# Patient Record
Sex: Male | Born: 1975
Health system: Southern US, Community
[De-identification: ages and names within clinical notes are randomized; demographics above are authoritative.]

## PROBLEM LIST (undated history)

## (undated) DIAGNOSIS — I38 Endocarditis, valve unspecified: Secondary | ICD-10-CM

## (undated) HISTORY — PX: NO PAST SURGERIES: SHX2092

---

## 2008-01-14 ENCOUNTER — Emergency Department: Payer: Self-pay | Admitting: Emergency Medicine

## 2020-05-13 DIAGNOSIS — R7881 Bacteremia: Secondary | ICD-10-CM

## 2020-05-14 DIAGNOSIS — I4891 Unspecified atrial fibrillation: Secondary | ICD-10-CM

## 2020-05-14 DIAGNOSIS — I079 Rheumatic tricuspid valve disease, unspecified: Secondary | ICD-10-CM

## 2020-05-14 DIAGNOSIS — J189 Pneumonia, unspecified organism: Secondary | ICD-10-CM

## 2020-05-14 DIAGNOSIS — F1910 Other psychoactive substance abuse, uncomplicated: Secondary | ICD-10-CM

## 2020-05-16 DIAGNOSIS — I079 Rheumatic tricuspid valve disease, unspecified: Secondary | ICD-10-CM

## 2020-05-16 DIAGNOSIS — J189 Pneumonia, unspecified organism: Secondary | ICD-10-CM

## 2020-05-16 DIAGNOSIS — I2690 Septic pulmonary embolism without acute cor pulmonale: Secondary | ICD-10-CM

## 2020-05-16 DIAGNOSIS — I4891 Unspecified atrial fibrillation: Secondary | ICD-10-CM

## 2020-05-18 DIAGNOSIS — M545 Low back pain, unspecified: Secondary | ICD-10-CM

## 2020-05-18 DIAGNOSIS — N289 Disorder of kidney and ureter, unspecified: Secondary | ICD-10-CM

## 2020-05-21 ENCOUNTER — Emergency Department (HOSPITAL_COMMUNITY): Payer: Medicaid Other

## 2020-05-21 ENCOUNTER — Other Ambulatory Visit: Payer: Self-pay

## 2020-05-21 ENCOUNTER — Inpatient Hospital Stay (HOSPITAL_COMMUNITY)
Admission: EM | Admit: 2020-05-21 | Discharge: 2020-07-15 | DRG: 981 | Disposition: A | Discharge status: Home / Self Care | Payer: Medicaid Other | Attending: Internal Medicine | Admitting: Internal Medicine

## 2020-05-21 DIAGNOSIS — N189 Chronic kidney disease, unspecified: Secondary | ICD-10-CM | POA: Diagnosis present

## 2020-05-21 DIAGNOSIS — B181 Chronic viral hepatitis B without delta-agent: Secondary | ICD-10-CM | POA: Diagnosis present

## 2020-05-21 DIAGNOSIS — M4646 Discitis, unspecified, lumbar region: Secondary | ICD-10-CM | POA: Diagnosis present

## 2020-05-21 DIAGNOSIS — R748 Abnormal levels of other serum enzymes: Secondary | ICD-10-CM | POA: Diagnosis present

## 2020-05-21 DIAGNOSIS — F17210 Nicotine dependence, cigarettes, uncomplicated: Secondary | ICD-10-CM | POA: Diagnosis present

## 2020-05-21 DIAGNOSIS — I2690 Septic pulmonary embolism without acute cor pulmonale: Secondary | ICD-10-CM | POA: Diagnosis present

## 2020-05-21 DIAGNOSIS — I38 Endocarditis, valve unspecified: Secondary | ICD-10-CM

## 2020-05-21 DIAGNOSIS — G061 Intraspinal abscess and granuloma: Secondary | ICD-10-CM | POA: Diagnosis not present

## 2020-05-21 DIAGNOSIS — B9562 Methicillin resistant Staphylococcus aureus infection as the cause of diseases classified elsewhere: Secondary | ICD-10-CM | POA: Diagnosis present

## 2020-05-21 DIAGNOSIS — I472 Ventricular tachycardia: Secondary | ICD-10-CM | POA: Diagnosis not present

## 2020-05-21 DIAGNOSIS — N131 Hydronephrosis with ureteral stricture, not elsewhere classified: Secondary | ICD-10-CM | POA: Diagnosis not present

## 2020-05-21 DIAGNOSIS — N1330 Unspecified hydronephrosis: Secondary | ICD-10-CM

## 2020-05-21 DIAGNOSIS — R008 Other abnormalities of heart beat: Secondary | ICD-10-CM | POA: Diagnosis not present

## 2020-05-21 DIAGNOSIS — E785 Hyperlipidemia, unspecified: Secondary | ICD-10-CM

## 2020-05-21 DIAGNOSIS — Z6825 Body mass index (BMI) 25.0-25.9, adult: Secondary | ICD-10-CM

## 2020-05-21 DIAGNOSIS — D6959 Other secondary thrombocytopenia: Secondary | ICD-10-CM | POA: Diagnosis present

## 2020-05-21 DIAGNOSIS — E8809 Other disorders of plasma-protein metabolism, not elsewhere classified: Secondary | ICD-10-CM | POA: Diagnosis present

## 2020-05-21 DIAGNOSIS — G253 Myoclonus: Secondary | ICD-10-CM | POA: Diagnosis not present

## 2020-05-21 DIAGNOSIS — E872 Acidosis: Secondary | ICD-10-CM | POA: Diagnosis not present

## 2020-05-21 DIAGNOSIS — F1190 Opioid use, unspecified, uncomplicated: Secondary | ICD-10-CM

## 2020-05-21 DIAGNOSIS — I76 Septic arterial embolism: Secondary | ICD-10-CM | POA: Diagnosis present

## 2020-05-21 DIAGNOSIS — N049 Nephrotic syndrome with unspecified morphologic changes: Secondary | ICD-10-CM | POA: Diagnosis present

## 2020-05-21 DIAGNOSIS — R042 Hemoptysis: Secondary | ICD-10-CM | POA: Diagnosis present

## 2020-05-21 DIAGNOSIS — N170 Acute kidney failure with tubular necrosis: Secondary | ICD-10-CM | POA: Diagnosis not present

## 2020-05-21 DIAGNOSIS — N471 Phimosis: Secondary | ICD-10-CM | POA: Diagnosis present

## 2020-05-21 DIAGNOSIS — M4626 Osteomyelitis of vertebra, lumbar region: Secondary | ICD-10-CM | POA: Diagnosis present

## 2020-05-21 DIAGNOSIS — Z792 Long term (current) use of antibiotics: Secondary | ICD-10-CM

## 2020-05-21 DIAGNOSIS — B1920 Unspecified viral hepatitis C without hepatic coma: Secondary | ICD-10-CM | POA: Diagnosis present

## 2020-05-21 DIAGNOSIS — M47816 Spondylosis without myelopathy or radiculopathy, lumbar region: Secondary | ICD-10-CM | POA: Diagnosis present

## 2020-05-21 DIAGNOSIS — Z20822 Contact with and (suspected) exposure to covid-19: Secondary | ICD-10-CM | POA: Diagnosis not present

## 2020-05-21 DIAGNOSIS — N028 Recurrent and persistent hematuria with other morphologic changes: Secondary | ICD-10-CM | POA: Diagnosis present

## 2020-05-21 DIAGNOSIS — J189 Pneumonia, unspecified organism: Secondary | ICD-10-CM | POA: Diagnosis not present

## 2020-05-21 DIAGNOSIS — G629 Polyneuropathy, unspecified: Secondary | ICD-10-CM | POA: Diagnosis present

## 2020-05-21 DIAGNOSIS — R7881 Bacteremia: Secondary | ICD-10-CM | POA: Diagnosis present

## 2020-05-21 DIAGNOSIS — E875 Hyperkalemia: Secondary | ICD-10-CM | POA: Diagnosis present

## 2020-05-21 DIAGNOSIS — I129 Hypertensive chronic kidney disease with stage 1 through stage 4 chronic kidney disease, or unspecified chronic kidney disease: Secondary | ICD-10-CM | POA: Diagnosis present

## 2020-05-21 DIAGNOSIS — D638 Anemia in other chronic diseases classified elsewhere: Secondary | ICD-10-CM | POA: Diagnosis present

## 2020-05-21 DIAGNOSIS — R509 Fever, unspecified: Secondary | ICD-10-CM

## 2020-05-21 DIAGNOSIS — R600 Localized edema: Secondary | ICD-10-CM | POA: Diagnosis present

## 2020-05-21 DIAGNOSIS — G062 Extradural and subdural abscess, unspecified: Secondary | ICD-10-CM | POA: Diagnosis present

## 2020-05-21 DIAGNOSIS — D619 Aplastic anemia, unspecified: Secondary | ICD-10-CM | POA: Diagnosis present

## 2020-05-21 DIAGNOSIS — Z419 Encounter for procedure for purposes other than remedying health state, unspecified: Secondary | ICD-10-CM

## 2020-05-21 DIAGNOSIS — E440 Moderate protein-calorie malnutrition: Secondary | ICD-10-CM | POA: Diagnosis present

## 2020-05-21 DIAGNOSIS — R339 Retention of urine, unspecified: Secondary | ICD-10-CM | POA: Diagnosis present

## 2020-05-21 DIAGNOSIS — E8770 Fluid overload, unspecified: Secondary | ICD-10-CM | POA: Diagnosis present

## 2020-05-21 DIAGNOSIS — F1110 Opioid abuse, uncomplicated: Secondary | ICD-10-CM | POA: Diagnosis present

## 2020-05-21 DIAGNOSIS — Q211 Atrial septal defect: Secondary | ICD-10-CM | POA: Diagnosis not present

## 2020-05-21 DIAGNOSIS — I330 Acute and subacute infective endocarditis: Secondary | ICD-10-CM | POA: Diagnosis not present

## 2020-05-21 DIAGNOSIS — E871 Hypo-osmolality and hyponatremia: Secondary | ICD-10-CM | POA: Diagnosis present

## 2020-05-21 DIAGNOSIS — R7989 Other specified abnormal findings of blood chemistry: Secondary | ICD-10-CM | POA: Diagnosis present

## 2020-05-21 DIAGNOSIS — D509 Iron deficiency anemia, unspecified: Secondary | ICD-10-CM | POA: Diagnosis present

## 2020-05-21 DIAGNOSIS — E876 Hypokalemia: Secondary | ICD-10-CM | POA: Diagnosis not present

## 2020-05-21 DIAGNOSIS — N179 Acute kidney failure, unspecified: Secondary | ICD-10-CM

## 2020-05-21 DIAGNOSIS — R319 Hematuria, unspecified: Secondary | ICD-10-CM

## 2020-05-21 DIAGNOSIS — M62838 Other muscle spasm: Secondary | ICD-10-CM | POA: Diagnosis present

## 2020-05-21 DIAGNOSIS — A4902 Methicillin resistant Staphylococcus aureus infection, unspecified site: Secondary | ICD-10-CM | POA: Diagnosis present

## 2020-05-21 HISTORY — DX: Endocarditis, valve unspecified: I38

## 2020-05-21 LAB — CBC WITH DIFFERENTIAL/PLATELET
Abs Immature Granulocytes: 0.2 10*3/uL — ABNORMAL HIGH (ref 0.00–0.07)
Basophils Absolute: 0 10*3/uL (ref 0.0–0.1)
Basophils Relative: 0 %
Eosinophils Absolute: 0 10*3/uL (ref 0.0–0.5)
Eosinophils Relative: 0 %
HCT: 21.8 % — ABNORMAL LOW (ref 39.0–52.0)
Hemoglobin: 7.3 g/dL — ABNORMAL LOW (ref 13.0–17.0)
Immature Granulocytes: 1 %
Lymphocytes Relative: 6 %
Lymphs Abs: 0.9 10*3/uL (ref 0.7–4.0)
MCH: 26.3 pg (ref 26.0–34.0)
MCHC: 33.5 g/dL (ref 30.0–36.0)
MCV: 78.4 fL — ABNORMAL LOW (ref 80.0–100.0)
Monocytes Absolute: 0.8 10*3/uL (ref 0.1–1.0)
Monocytes Relative: 5 %
Neutro Abs: 14 10*3/uL — ABNORMAL HIGH (ref 1.7–7.7)
Neutrophils Relative %: 88 %
Platelets: 154 10*3/uL (ref 150–400)
RBC: 2.78 MIL/uL — ABNORMAL LOW (ref 4.22–5.81)
RDW: 15.5 % (ref 11.5–15.5)
WBC: 15.9 10*3/uL — ABNORMAL HIGH (ref 4.0–10.5)
nRBC: 0 % (ref 0.0–0.2)

## 2020-05-21 LAB — URINALYSIS, ROUTINE W REFLEX MICROSCOPIC
Bilirubin Urine: NEGATIVE
Glucose, UA: NEGATIVE mg/dL
Ketones, ur: NEGATIVE mg/dL
Nitrite: NEGATIVE
Protein, ur: 100 mg/dL — AB
RBC / HPF: >50 RBC/hpf — ABNORMAL HIGH (ref 0–5)
Specific Gravity, Urine: 1.018 (ref 1.005–1.030)
WBC, UA: >50 WBC/hpf — ABNORMAL HIGH (ref 0–5)
pH: 5 (ref 5.0–8.0)

## 2020-05-21 LAB — CBC
HCT: 23.7 % — ABNORMAL LOW (ref 39.0–52.0)
Hemoglobin: 8.1 g/dL — ABNORMAL LOW (ref 13.0–17.0)
MCH: 27 pg (ref 26.0–34.0)
MCHC: 34.2 g/dL (ref 30.0–36.0)
MCV: 79 fL — ABNORMAL LOW (ref 80.0–100.0)
Platelets: 170 10*3/uL (ref 150–400)
RBC: 3 MIL/uL — ABNORMAL LOW (ref 4.22–5.81)
RDW: 15.7 % — ABNORMAL HIGH (ref 11.5–15.5)
WBC: 18.9 10*3/uL — ABNORMAL HIGH (ref 4.0–10.5)
nRBC: 0 % (ref 0.0–0.2)

## 2020-05-21 LAB — IRON AND TIBC
Iron: 40 ug/dL — ABNORMAL LOW (ref 45–182)
Saturation Ratios: 21 % (ref 17.9–39.5)
TIBC: 189 ug/dL — ABNORMAL LOW (ref 250–450)
UIBC: 149 ug/dL

## 2020-05-21 LAB — RENAL FUNCTION PANEL
Albumin: 1.5 g/dL — ABNORMAL LOW (ref 3.5–5.0)
Anion gap: 17 — ABNORMAL HIGH (ref 5–15)
BUN: 184 mg/dL — ABNORMAL HIGH (ref 6–20)
CO2: 12 mmol/L — ABNORMAL LOW (ref 22–32)
Calcium: 6.7 mg/dL — ABNORMAL LOW (ref 8.9–10.3)
Chloride: 98 mmol/L (ref 98–111)
Creatinine, Ser: 6.56 mg/dL — ABNORMAL HIGH (ref 0.61–1.24)
GFR, Estimated: 10 mL/min — ABNORMAL LOW (ref >60)
Glucose, Bld: 147 mg/dL — ABNORMAL HIGH (ref 70–99)
Phosphorus: >30 mg/dL — ABNORMAL HIGH (ref 2.5–4.6)
Potassium: 5.8 mmol/L — ABNORMAL HIGH (ref 3.5–5.1)
Sodium: 127 mmol/L — ABNORMAL LOW (ref 135–145)

## 2020-05-21 LAB — I-STAT CHEM 8, ED
BUN: >130 mg/dL — ABNORMAL HIGH (ref 6–20)
BUN: >130 mg/dL — ABNORMAL HIGH (ref 6–20)
Calcium, Ion: 0.74 mmol/L — CL (ref 1.15–1.40)
Calcium, Ion: 0.89 mmol/L — CL (ref 1.15–1.40)
Chloride: 102 mmol/L (ref 98–111)
Chloride: 100 mmol/L (ref 98–111)
Creatinine, Ser: 7.5 mg/dL — ABNORMAL HIGH (ref 0.61–1.24)
Creatinine, Ser: 7.4 mg/dL — ABNORMAL HIGH (ref 0.61–1.24)
Glucose, Bld: 98 mg/dL (ref 70–99)
Glucose, Bld: 95 mg/dL (ref 70–99)
HCT: 21 % — ABNORMAL LOW (ref 39.0–52.0)
HCT: 24 % — ABNORMAL LOW (ref 39.0–52.0)
Hemoglobin: 7.1 g/dL — ABNORMAL LOW (ref 13.0–17.0)
Hemoglobin: 8.2 g/dL — ABNORMAL LOW (ref 13.0–17.0)
Potassium: 6.1 mmol/L — ABNORMAL HIGH (ref 3.5–5.1)
Potassium: 6.1 mmol/L — ABNORMAL HIGH (ref 3.5–5.1)
Sodium: 128 mmol/L — ABNORMAL LOW (ref 135–145)
Sodium: 129 mmol/L — ABNORMAL LOW (ref 135–145)
TCO2: 14 mmol/L — ABNORMAL LOW (ref 22–32)
TCO2: 16 mmol/L — ABNORMAL LOW (ref 22–32)

## 2020-05-21 LAB — PROTEIN / CREATININE RATIO, URINE
Creatinine, Urine: 256.24 mg/dL
Protein Creatinine Ratio: 0.94 mg/mg{Cre} — ABNORMAL HIGH (ref 0.00–0.15)
Total Protein, Urine: 242 mg/dL

## 2020-05-21 LAB — HEMOGLOBIN A1C
Hgb A1c MFr Bld: 5.9 % — ABNORMAL HIGH (ref 4.8–5.6)
Mean Plasma Glucose: 122.63 mg/dL

## 2020-05-21 LAB — COMPREHENSIVE METABOLIC PANEL
ALT: 38 U/L (ref 0–44)
AST: 30 U/L (ref 15–41)
Albumin: 1.7 g/dL — ABNORMAL LOW (ref 3.5–5.0)
Alkaline Phosphatase: 297 U/L — ABNORMAL HIGH (ref 38–126)
Anion gap: 20 — ABNORMAL HIGH (ref 5–15)
BUN: 184 mg/dL — ABNORMAL HIGH (ref 6–20)
CO2: 11 mmol/L — ABNORMAL LOW (ref 22–32)
Calcium: 6.9 mg/dL — ABNORMAL LOW (ref 8.9–10.3)
Chloride: 99 mmol/L (ref 98–111)
Creatinine, Ser: 7.23 mg/dL — ABNORMAL HIGH (ref 0.61–1.24)
GFR, Estimated: 9 mL/min — ABNORMAL LOW (ref >60)
Glucose, Bld: 98 mg/dL (ref 70–99)
Potassium: 6.3 mmol/L (ref 3.5–5.1)
Sodium: 130 mmol/L — ABNORMAL LOW (ref 135–145)
Total Bilirubin: 1.1 mg/dL (ref 0.3–1.2)
Total Protein: 6.1 g/dL — ABNORMAL LOW (ref 6.5–8.1)

## 2020-05-21 LAB — HIV ANTIBODY (ROUTINE TESTING W REFLEX): HIV Screen 4th Generation wRfx: NONREACTIVE

## 2020-05-21 LAB — C-REACTIVE PROTEIN: CRP: 23.8 mg/dL — ABNORMAL HIGH (ref <1.0)

## 2020-05-21 LAB — BRAIN NATRIURETIC PEPTIDE: B Natriuretic Peptide: 171.3 pg/mL — ABNORMAL HIGH (ref 0.0–100.0)

## 2020-05-21 LAB — HEPARIN LEVEL (UNFRACTIONATED): Heparin Unfractionated: <0.1 IU/mL — ABNORMAL LOW (ref 0.30–0.70)

## 2020-05-21 LAB — SARS CORONAVIRUS 2 BY RT PCR (HOSPITAL ORDER, PERFORMED IN ~~LOC~~ HOSPITAL LAB): SARS Coronavirus 2: NEGATIVE

## 2020-05-21 LAB — TROPONIN I (HIGH SENSITIVITY)
Troponin I (High Sensitivity): 25 ng/L — ABNORMAL HIGH (ref <18)
Troponin I (High Sensitivity): 29 ng/L — ABNORMAL HIGH (ref <18)

## 2020-05-21 LAB — RAPID URINE DRUG SCREEN, HOSP PERFORMED
Amphetamines: NOT DETECTED
Barbiturates: NOT DETECTED
Benzodiazepines: NOT DETECTED
Cocaine: NOT DETECTED
Opiates: POSITIVE — AB
Tetrahydrocannabinol: NOT DETECTED

## 2020-05-21 LAB — LACTIC ACID, PLASMA: Lactic Acid, Venous: 0.9 mmol/L (ref 0.5–1.9)

## 2020-05-21 LAB — RETICULOCYTES
Immature Retic Fract: 7.3 % (ref 2.3–15.9)
RBC.: 2.73 MIL/uL — ABNORMAL LOW (ref 4.22–5.81)
Retic Count, Absolute: 34.4 10*3/uL (ref 19.0–186.0)
Retic Ct Pct: 1.3 % (ref 0.4–3.1)

## 2020-05-21 LAB — SEDIMENTATION RATE: Sed Rate: 105 mm/hr — ABNORMAL HIGH (ref 0–16)

## 2020-05-21 IMAGING — US US RENAL
1 series · 14 of 25 positions shown · non-contrast
Comparison: [DATE]

CLINICAL DATA: AK I

EXAM:
RENAL / URINARY TRACT ULTRASOUND COMPLETE

[Series 1: us renal · 14 of 50 slices shown]
[im 1/50]
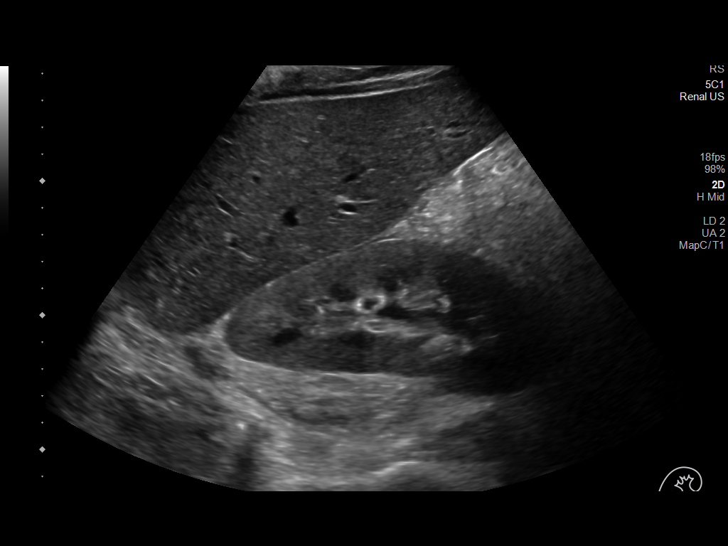
[im 5/50]
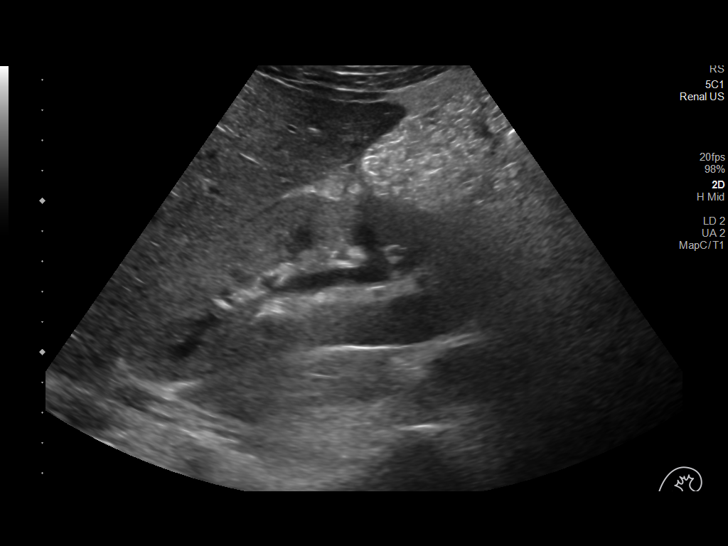
[im 9/50]
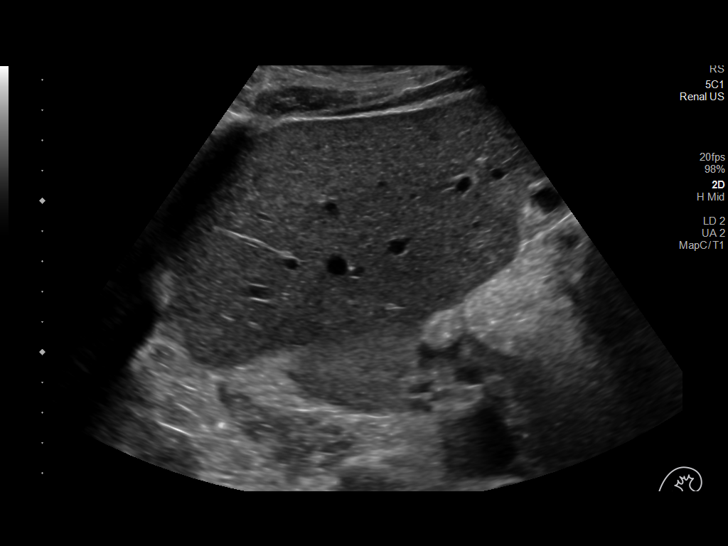
[im 13/50]
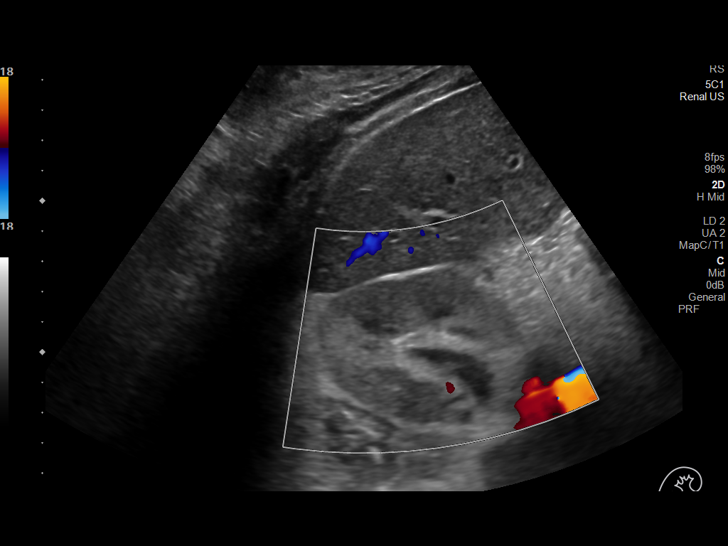
[im 17/50]
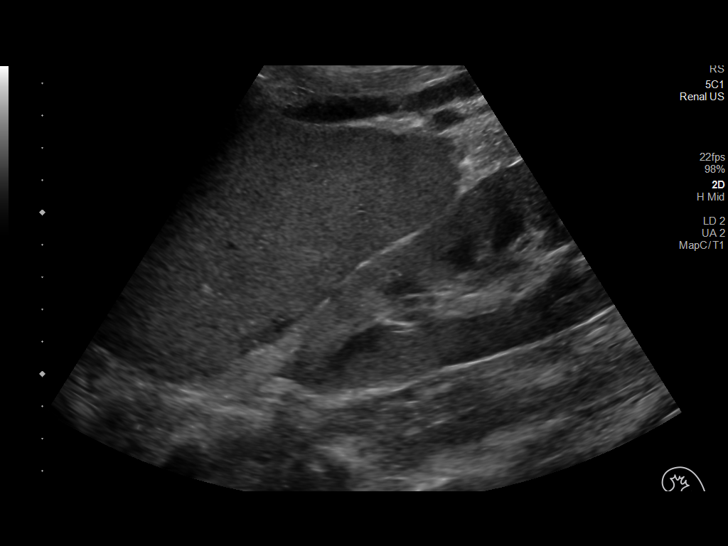
[im 19/50]
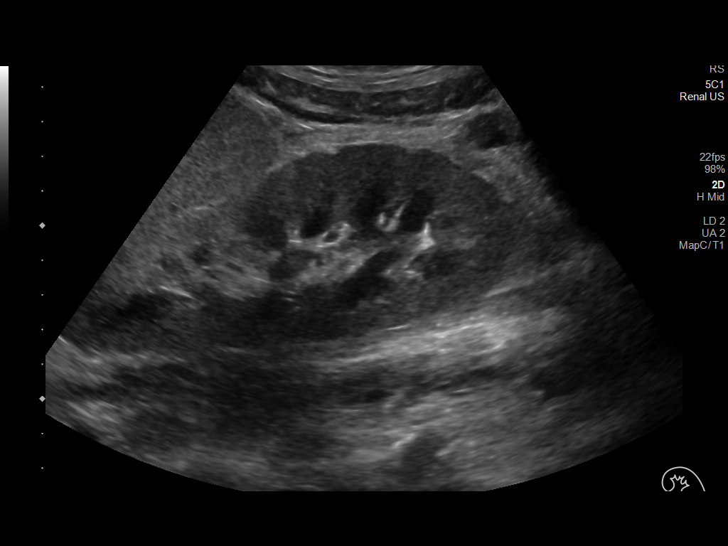
[im 23/50]
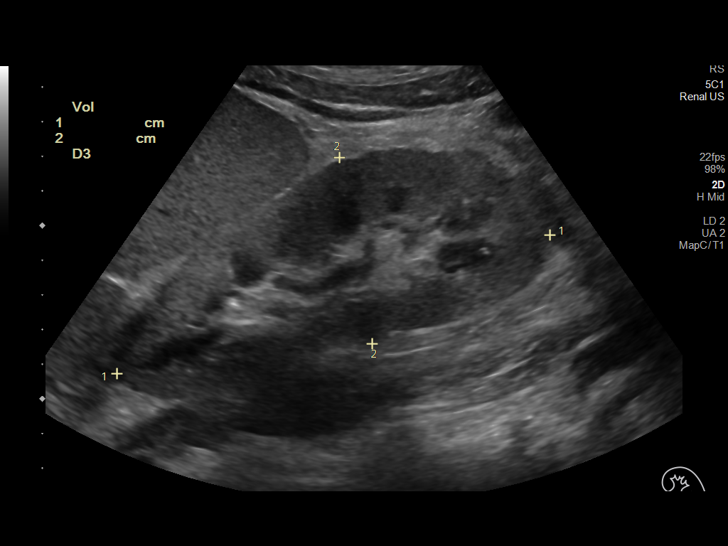
[im 27/50]
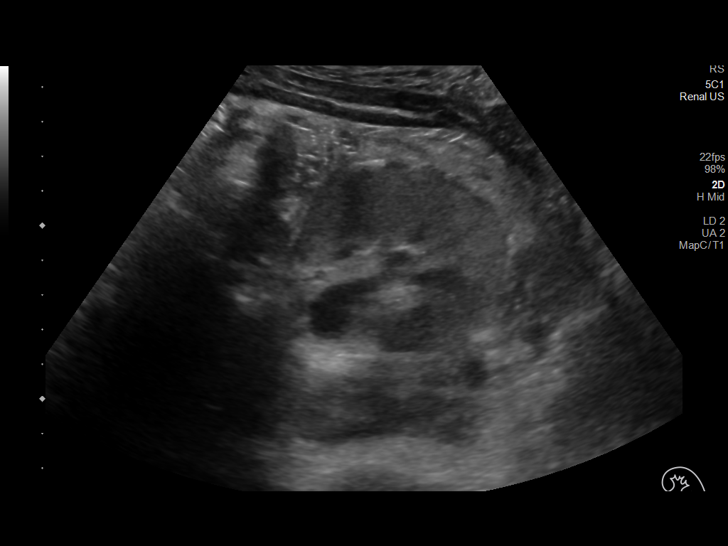
[im 31/50]
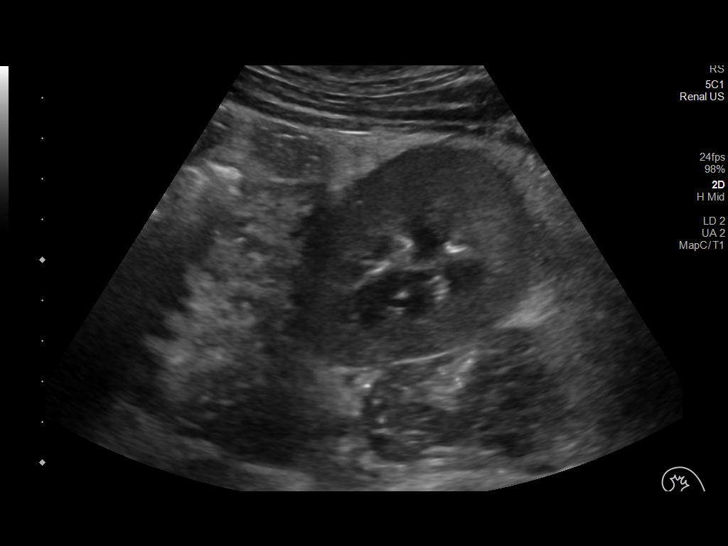
[im 33/50]
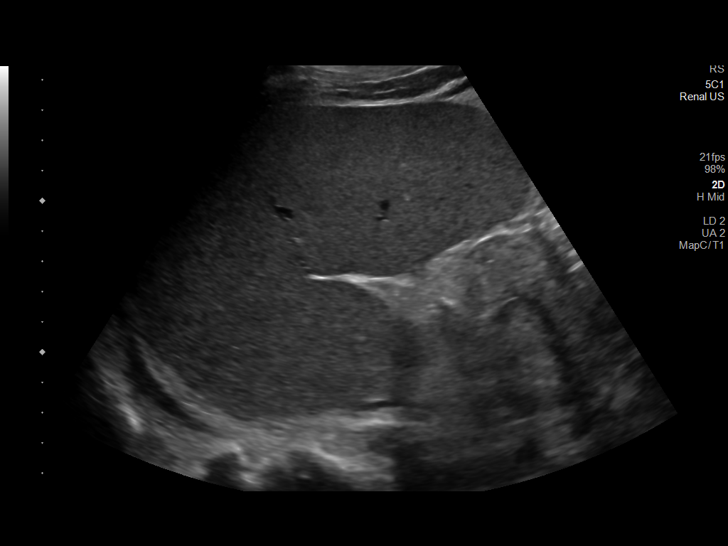
[im 37/50]
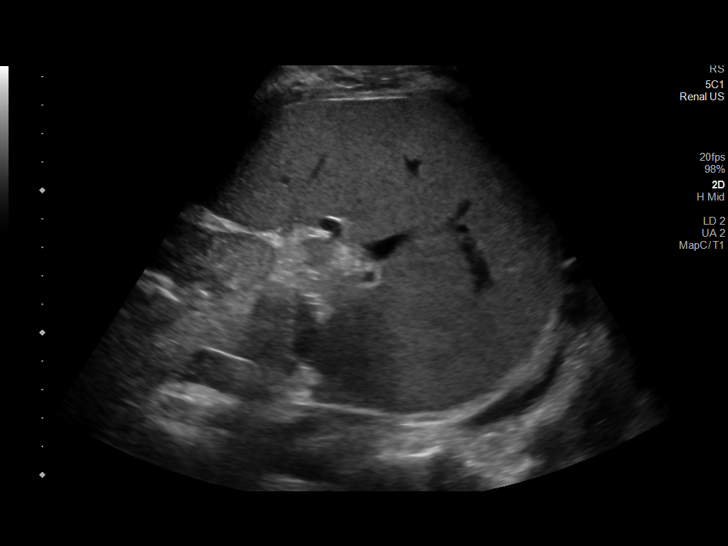
[im 41/50]
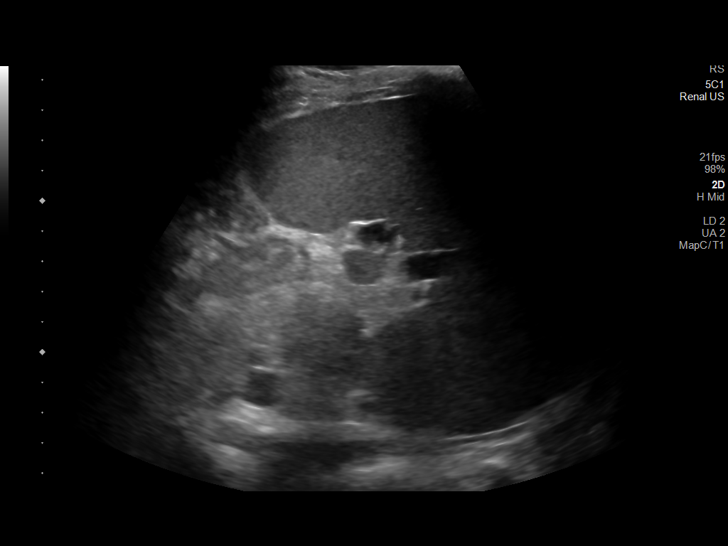
[im 45/50]
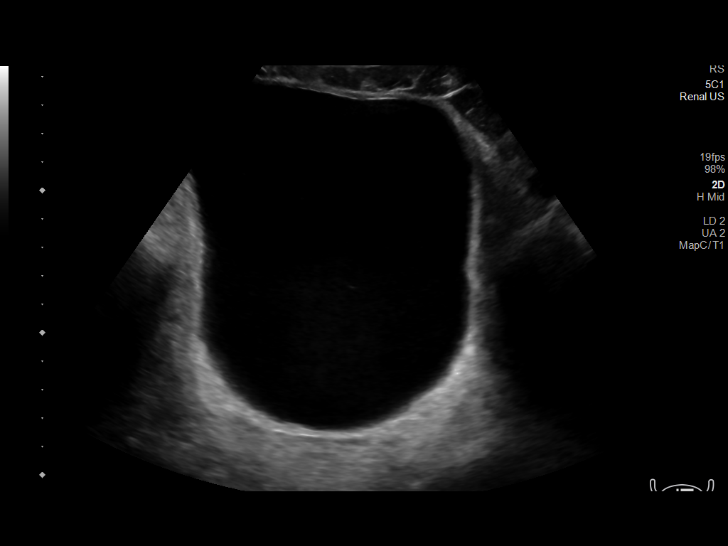
[im 50/50]
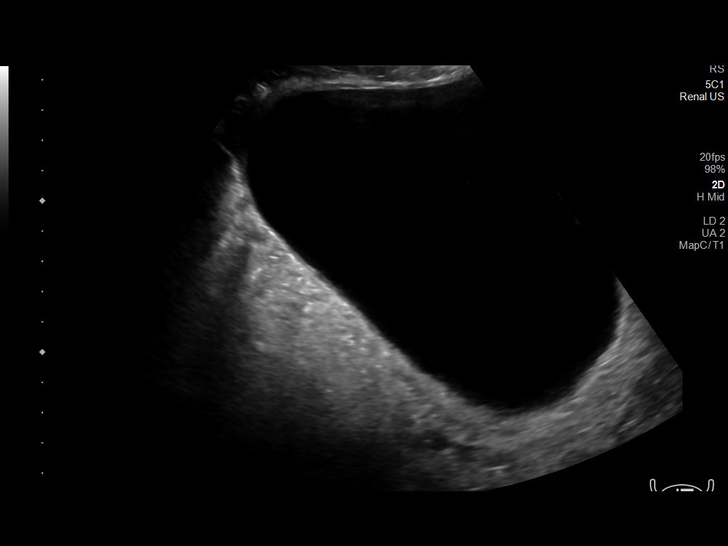

[14 of 25 positions shown; findings below may reference images not displayed]

FINDINGS: Right Kidney:

Renal measurements: 12.5 x 5.0 x 5.6 cm = volume: 182 mL. Diffusely
increased renal echogenicity. There is mild RIGHT hydronephrosis,
similar in comparison to prior.

Left Kidney:

Renal measurements: 13.1 x 5.5 x 5.9 cm = volume: 222 mL. Renal
echogenicity is diffusely increased. There is minimal LEFT
hydronephrosis.

Bladder:

Distended

Other:

Small volume ascites. Splenomegaly the spleen measuring at least 14
x 13.5 x 8 cm for a volume of 792 ML. Small LEFT pleural effusion.
IMPRESSION: 1. There is mild RIGHT greater than LEFT hydronephrosis. The bladder
is distended. Recommend correlation for outlet obstruction.
2. Splenomegaly.
3. Trace ascites and small LEFT pleural effusion.
4. Diffusely increased renal echogenicity as can be seen in medical
renal disease.

## 2020-05-21 IMAGING — DX DG CHEST 1V PORT
1 series · 1 of 1 positions shown · non-contrast
Comparison: [DATE] chest radiograph and chest CT [DATE]

CLINICAL DATA: Chest pain and shortness of breath

EXAM:
PORTABLE CHEST 1 VIEW

[chest ap]
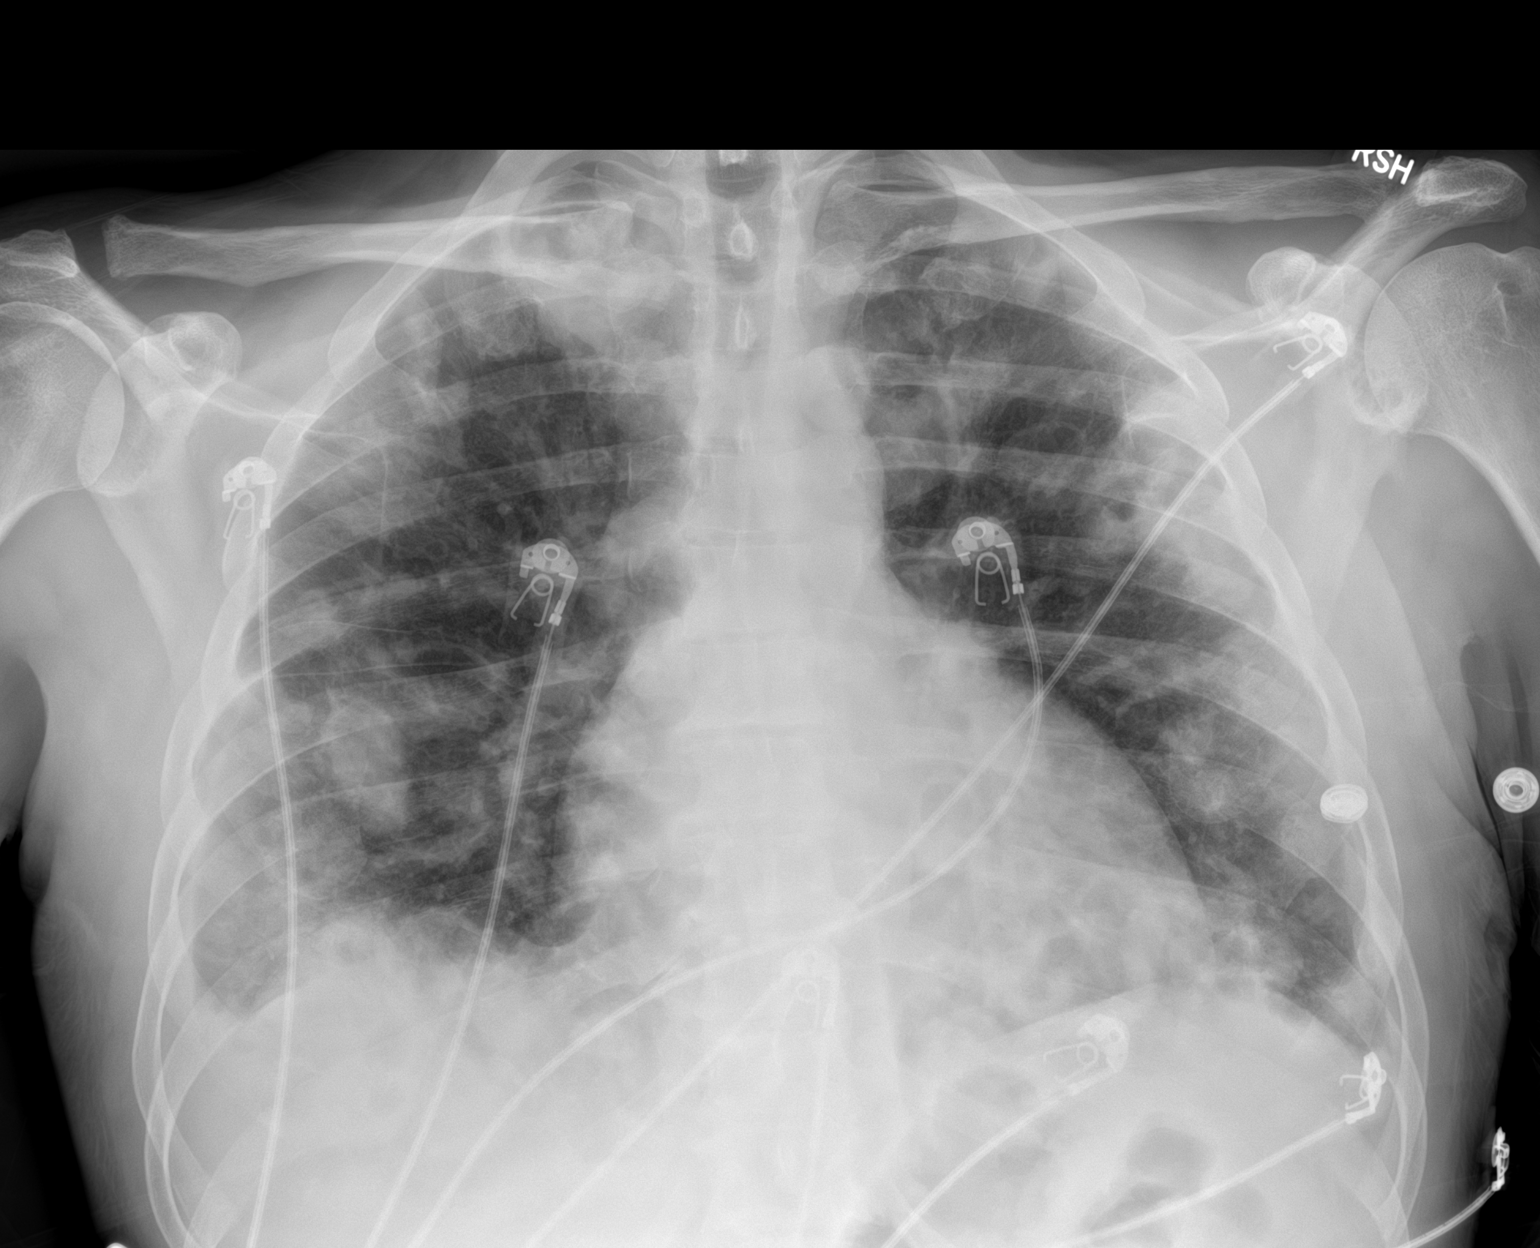

[1 of 1 positions shown; findings below may reference images not displayed]

FINDINGS: There are again noted multiple nodular lesions throughout the lungs
bilaterally, several of which are cavitated. There is a small right
pleural effusion. There is ill-defined airspace opacity in the right
base which appears increased from recent studies. Heart size and
pulmonary vascular normal. No adenopathy. Degenerative changes noted
in the left shoulder.
IMPRESSION: Multiple ill-defined nodular opacities of varying sizes, several of
which show cavitation, likely representing multifocal septic emboli.
These nodular opacities overall appear similar to findings on most
recent CT. There is a small right pleural effusion with slight
increase in airspace opacity in the right base, likely focus of
pneumonia.

Stable cardiac silhouette.

## 2020-05-21 MED ORDER — CALCIUM GLUCONATE-NACL 1-0.675 GM/50ML-% IV SOLN
1.0000 g | Freq: Once | INTRAVENOUS | Status: AC
  Administered 2020-05-21: 1000 mg via INTRAVENOUS
  Filled 2020-05-21: qty 50

## 2020-05-21 MED ORDER — SODIUM ZIRCONIUM CYCLOSILICATE 10 G PO PACK: 10.0000 g | PACK | Freq: Once | ORAL | Status: DC

## 2020-05-21 MED ORDER — CHLORHEXIDINE GLUCONATE CLOTH 2 % EX PADS
6.0000 | MEDICATED_PAD | Freq: Every day | CUTANEOUS | Status: DC
  Administered 2020-05-22 – 2020-05-23 (×2): 6 via TOPICAL

## 2020-05-21 MED ORDER — HEPARIN (PORCINE) 25000 UT/250ML-% IV SOLN
1600.0000 [IU]/h | INTRAVENOUS | Status: DC
  Administered 2020-05-21: 1300 [IU]/h via INTRAVENOUS
  Filled 2020-05-21: qty 250

## 2020-05-21 MED ORDER — SODIUM CHLORIDE 0.9 % IV SOLN
2.0000 g | Freq: Three times a day (TID) | INTRAVENOUS | Status: DC
  Administered 2020-05-21: 2 g via INTRAVENOUS
  Filled 2020-05-21: qty 2

## 2020-05-21 MED ORDER — LINEZOLID 600 MG/300ML IV SOLN
600.0000 mg | Freq: Two times a day (BID) | INTRAVENOUS | Status: DC
  Administered 2020-05-21 – 2020-05-22 (×3): 600 mg via INTRAVENOUS
  Filled 2020-05-21 (×4): qty 300

## 2020-05-21 MED ORDER — HEPARIN BOLUS VIA INFUSION
2000.0000 [IU] | Freq: Once | INTRAVENOUS | Status: DC
  Filled 2020-05-21: qty 2000

## 2020-05-21 MED ORDER — ALBUMIN HUMAN 25 % IV SOLN
25.0000 g | Freq: Once | INTRAVENOUS | Status: AC
  Administered 2020-05-21: 25 g via INTRAVENOUS
  Filled 2020-05-21: qty 100

## 2020-05-21 MED ORDER — SODIUM ZIRCONIUM CYCLOSILICATE 10 G PO PACK
10.0000 g | PACK | Freq: Once | ORAL | Status: AC
  Administered 2020-05-21: 10 g via ORAL
  Filled 2020-05-21: qty 1

## 2020-05-21 MED ORDER — SODIUM CHLORIDE 0.9 % IV SOLN: 1.0000 g | INTRAVENOUS | Status: DC

## 2020-05-21 MED ORDER — VANCOMYCIN HCL 1500 MG/300ML IV SOLN
1500.0000 mg | Freq: Once | INTRAVENOUS | Status: DC
  Filled 2020-05-21: qty 300

## 2020-05-21 MED ORDER — HEPARIN BOLUS VIA INFUSION
1500.0000 [IU] | Freq: Once | INTRAVENOUS | Status: DC
  Filled 2020-05-21: qty 1500

## 2020-05-21 NOTE — ED Provider Notes (Signed)
[facility] EMERGENCY DEPARTMENT Provider Note   CSN: [id number] Arrival date & time: 05/21/20  0827     History Chief Complaint  Patient presents with  . Chest Pain    Jadyn Barge is a 45 y.o. male with no pertinent past medical history that presents the emergency department today for chest pain and shortness of breath.  Patient states that he left AMA from [facility] 2 days ago, was there for 10 days.  States that he was being treated for an infection in his heart and pneumonia.  States that he went home after leaving AMA, was too weak to do anything therefore called EMS today.  States that his legs have been swelling, started yesterday.  States that he is unsure what his diagnosis was.  Does admit to cough, states that he is having multiple rounds of hemoptysis which started 2 days ago.  States that he was tested for Covid there, was negative twice.  Denies any sick contacts.  Denies any fevers or chills.  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  States that he has hepatitis C that he is aware of, has not been treated for this.  Does not have any other diagnoses that he is aware of.  States that his chest pain is in the center of his chest, is pleuritic and sharp.  States that he is very weak, was unable to to get out of bed this morning/walk.  No focal weakness.  Denies any headache, nausea, vomiting, abdominal pain, back pain, diarrhea.  Denies any neck pain.  HPI     No past medical history on file.  There are no problems to display for this patient.       No family history on file.     Home Medications Prior to Admission medications   Not on File    Allergies    Patient has no allergy information on record.  Review of Systems   Review of Systems  Constitutional: Negative for chills, diaphoresis, fatigue and fever.  HENT: Negative for congestion, sore throat and trouble swallowing.   Eyes: Negative for pain and  visual disturbance.  Respiratory: Positive for shortness of breath. Negative for cough and wheezing.   Cardiovascular: Positive for chest pain and leg swelling. Negative for palpitations.  Gastrointestinal: Negative for abdominal distention, abdominal pain, diarrhea, nausea and vomiting.  Genitourinary: Negative for difficulty urinating.  Musculoskeletal: Negative for back pain, neck pain and neck stiffness.  Skin: Negative for pallor.  Neurological: Negative for dizziness, speech difficulty, weakness and headaches.  Psychiatric/Behavioral: Negative for confusion.    Physical Exam Updated Vital Signs BP (!) 167/92   Pulse 95   Temp 98.3 F (36.8 C) (Oral)   Resp (!) 26   Ht 5' 10"  (1.778 m)   Wt 74.8 kg   SpO2 100%   BMI 23.68 kg/m   Physical Exam Constitutional:      General: He is in acute distress.     Appearance: Normal appearance. He is ill-appearing. He is not toxic-appearing or diaphoretic.     Comments: Patient is an ill-appearing 45 year old, looks older than stated age.  Appears cachectic and weak.  HENT:     Head: Normocephalic and atraumatic.     Mouth/Throat:     Mouth: Mucous membranes are moist.     Pharynx: Oropharynx is clear.  Eyes:     General: No scleral icterus.    Extraocular Movements: Extraocular movements  intact.     Pupils: Pupils are equal, round, and reactive to light.  Cardiovascular:     Rate and Rhythm: Normal rate and regular rhythm.     Pulses: Normal pulses.     Heart sounds: Murmur heard.    Pulmonary:     Effort: Pulmonary effort is normal. Tachypnea present. No accessory muscle usage or respiratory distress.     Breath sounds: Normal breath sounds. No stridor. No wheezing, rhonchi or rales.  Chest:     Chest wall: No tenderness.  Abdominal:     General: Abdomen is flat. There is no distension.     Palpations: Abdomen is soft.     Tenderness: There is no abdominal tenderness. There is no guarding or rebound.  Musculoskeletal:         General: No swelling or tenderness. Normal range of motion.     Cervical back: Normal range of motion and neck supple. No rigidity.     Right lower leg: Edema present.     Left lower leg: Edema present.     Comments: 4+ pitting edema noted throughout bilateral lower extremities.  Able to range extremities very minimally due to weakness and pain.  No erythema or warmth noted.  Skin:    General: Skin is warm and dry.     Capillary Refill: Capillary refill takes less than 2 seconds.     Coloration: Skin is not pale.     Comments: Track marks noted.  Neurological:     General: No focal deficit present.     Mental Status: He is alert and oriented to person, place, and time.     Cranial Nerves: No cranial nerve deficit.     Motor: Weakness present.     Coordination: Coordination normal.     Comments: Alert. Clear speech. No facial droop. CNIII-XII grossly intact. Bilateral upper and lower extremities' sensation grossly intact. 4/5 symmetric strength with grip strength and with plantar and dorsi flexion bilaterally.  Psychiatric:        Mood and Affect: Mood normal.        Behavior: Behavior normal.     ED Results / Procedures / Treatments   Labs (all labs ordered are listed, but only abnormal results are displayed) Labs Reviewed  CBC - Abnormal; Notable for the following components:      Result Value   WBC 18.9 (*)    RBC 3.00 (*)    Hemoglobin 8.1 (*)    HCT 23.7 (*)    MCV 79.0 (*)    RDW 15.7 (*)    All other components within normal limits  COMPREHENSIVE METABOLIC PANEL - Abnormal; Notable for the following components:   Sodium 130 (*)    Potassium 6.3 (*)    CO2 11 (*)    BUN 184 (*)    Creatinine, Ser 7.23 (*)    Calcium 6.9 (*)    Total Protein 6.1 (*)    Albumin 1.7 (*)    Alkaline Phosphatase 297 (*)    GFR, Estimated 9 (*)    Anion gap 20 (*)    All other components within normal limits  BRAIN NATRIURETIC PEPTIDE - Abnormal; Notable for the following  components:   B Natriuretic Peptide 171.3 (*)    All other components within normal limits  SEDIMENTATION RATE - Abnormal; Notable for the following components:   Sed Rate 105 (*)    All other components within normal limits  I-STAT CHEM 8, ED - Abnormal; Notable  for the following components:   Sodium 128 (*)    Potassium 6.1 (*)    BUN >130 (*)    Creatinine, Ser 7.50 (*)    Calcium, Ion 0.74 (*)    TCO2 14 (*)    Hemoglobin 7.1 (*)    HCT 21.0 (*)    All other components within normal limits  TROPONIN I (HIGH SENSITIVITY) - Abnormal; Notable for the following components:   Troponin I (High Sensitivity) 25 (*)    All other components within normal limits  SARS CORONAVIRUS 2 BY RT PCR (HOSPITAL ORDER, [place] [facility])  CULTURE, BLOOD (ROUTINE X 2)  CULTURE, BLOOD (ROUTINE X 2)  C-REACTIVE PROTEIN  HIV ANTIBODY (ROUTINE TESTING W REFLEX)  LACTIC ACID, PLASMA  LACTIC ACID, PLASMA  HEPARIN LEVEL (UNFRACTIONATED)  TROPONIN I (HIGH SENSITIVITY)    EKG None  Radiology DG Chest Port 1 View  Result Date: 05/21/2020 CLINICAL DATA:  Chest pain and shortness of breath EXAM: PORTABLE CHEST 1 VIEW COMPARISON:  May 15, 2020 chest radiograph and chest CT May 18, 2020 FINDINGS: There are again noted multiple nodular lesions throughout the lungs bilaterally, several of which are cavitated. There is a small right pleural effusion. There is ill-defined airspace opacity in the right base which appears increased from recent studies. Heart size and pulmonary vascular normal. No adenopathy. Degenerative changes noted in the left shoulder. IMPRESSION: Multiple ill-defined nodular opacities of varying sizes, several of which show cavitation, likely representing multifocal septic emboli. These nodular opacities overall appear similar to findings on most recent CT. There is a small right pleural effusion with slight increase in airspace opacity in the right base, likely focus  of pneumonia. Stable cardiac silhouette. Electronically Signed   By: Lowella Grip III M.D.   On: 05/21/2020 09:02    Procedures .Critical Care Performed by: Alfredia Client, PA-C Authorized by: Alfredia Client, PA-C   Critical care provider statement:    Critical care time (minutes):  45   Critical care was time spent personally by me on the following activities:  Discussions with consultants, evaluation of patient's response to treatment, examination of patient, ordering and performing treatments and interventions, ordering and review of laboratory studies, ordering and review of radiographic studies, pulse oximetry, re-evaluation of patient's condition, obtaining history from patient or surrogate and review of old charts     Medications Ordered in ED Medications  ceFEPIme (MAXIPIME) 2 g in sodium chloride 0.9 % 100 mL IVPB (2 g Intravenous New Bag/Given 05/21/20 1024)  linezolid (ZYVOX) IVPB 600 mg (has no administration in time range)  heparin ADULT infusion 100 units/mL (25000 units/247m) (has no administration in time range)  sodium zirconium cyclosilicate (LOKELMA) packet 10 g (has no administration in time range)  calcium gluconate 1 g/ 50 mL sodium chloride IVPB (has no administration in time range)    ED Course  I have reviewed the triage vital signs and the nursing notes.  Pertinent labs & imaging results that were available during my care of the patient were reviewed by me and considered in my medical decision making (see chart for details).  Clinical Course as of 05/21/20 1101  Thu May 21, 2020  1025 Sodium(!): 128 [SP]    Clinical Course User Index [SP] PAlfredia Client PA-C   MDM Rules/Calculators/A&P                         DMuhamed Lueckeis a 45y.o. male  with no pertinent past medical history that presents the emergency department today for chest pain and shortness of breath.  Was able to review [facility]'s imaging, did request for records.  Chest x-ray on January  18 that showed patchy bilateral peripheral opacities concerning for multifocal pneumonia.  CT did show similar groundglass opacities suggestive of COVID-19 infection.  Did have increased creatinine function, did get VQ scan with findings suspicious for PE.  Did get another CT on January 24 which showed increased size and cavitation of bilateral pulmonary nodules, suspicious of septic emboli.  Did also show third spacing throughout.  Unable to see other records at this time.  Per pharmacy records, patient was on vancomycin and patient was still growing out MRSA, pharmacy states they had transitioned him over to Zyvox.  Will initiate that along with cefepime for presumed endocarditis.  We will also start patient on heparin for presumed PE.  Unable to obtain PE study at this time due to creatinine.  Work-up today shows CMP with potassium of 6.3, creatinine of 7.23, other derangements including low calcium of 6.9, albumin of 1.7.  Elevated alk phos and elevated anion gap.  Most likely due to metabolic acidosis.  Will give calcium and Lokelma here today.  Did speak to Dr. Johnney Ou, nephrology.  They will consult.   CBC with leukocytosis of 18.9, hemoglobin of 8.1.  Troponin elevated to 25, BNP 171.  Think patient has lower extremity edema most likely due to third spacing from poor kidney function, questionable heart failure.  Unable to obtain echo from prior records.  Patient is stabilized for admission at this time.  Covid negative.  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  MRSA bacteremia, secondary to pneumonia and IV drug abuse.   Spoke to Dr. Rebeca Alert, IM residents who lives at the patient.  The patient appears reasonably stabilized for admission considering the current resources, flow, and capabilities available in the ED at this time, and I doubt any other [facility] requiring further screening and/or treatment in the ED prior to  admission.  I discussed this case with my attending physician who cosigned this note including patient's presenting symptoms, physical exam, and planned diagnostics and interventions. Attending physician stated agreement with plan or made changes to plan which were implemented.    Final Clinical Impression(s) / ED Diagnoses Final diagnoses:  Subacute infective endocarditis, due to unspecified organism    Rx / DC Orders ED Discharge Orders    None       Alfredia Client, PA-C 05/21/20 1242    Truddie Hidden, MD 05/21/20 [phone]

## 2020-05-21 NOTE — Consult Note (Signed)
Urology Consult   Physician requesting consult: Jessy Oto, MD  Reason for consult: Paraphimosis  History of Present Illness: Steven Cortez is a 45 y.o. male with histor of IVDU, HCV, recent endocarditis and MRSA bacteremia for whom urology is consulted regarding concern for paraphimosis following placement of foley catheter in the ED.   Patient was recently hospitalized for endocarditis but per notes left AMA. On arrival to the ED, noted to have severe AKI, metabolic acidosis. RUS showed distended bladder with mild bilateral hydronephrosis. Foley catheter placement was attempted in the ED but there was difficulty pulling back the foreskin secondary to phimosis. This was eventually achieved and catheter placed with return of 750cc urine, but after placement, team was unable to replace the foreskin over the glans secondary to this phimotic band.   Denies prior history of voiding difficulties. Reports that he was voiding regularly prior to presenting to the ED.   Current Hospital Medications:  Home Meds:  No current facility-administered medications on file prior to encounter.   No current outpatient medications on file prior to encounter.     Scheduled Meds: . [START ON 05/22/2020] Chlorhexidine Gluconate Cloth  6 each Topical Q0600   Continuous Infusions: . heparin 1,300 Units/hr (05/21/20 1325)  . linezolid (ZYVOX) IV Stopped (05/21/20 1209)   PRN Meds:.  Allergies: No Known Allergies  No family history on file.  Social History:  has no history on file for tobacco use, alcohol use, and drug use.  ROS: A complete review of systems was performed.  All systems are negative except for pertinent findings as noted.  Physical Exam:  Vital signs in last 24 hours: Temp:  [98.2 F (36.8 C)-98.3 F (36.8 C)] 98.2 F (36.8 C) (01/27 1150) Pulse Rate:  [86-96] 94 (01/27 1630) Resp:  [20-29] 24 (01/27 1630) BP: (155-176)/(79-101) 171/97 (01/27 1630) SpO2:  [98 %-100 %] 98 %  (01/27 1630) Weight:  [74.8 kg] 74.8 kg (01/27 0832) Constitutional:  Alert and oriented, No acute distress HEENT: Poor dentition Cardiovascular: Regular rate and rhythm Respiratory: Increased work of breathing on room air GI: Abdomen is soft, nontender, nondistended, no abdominal masses GU: Foley in place, urine clear yellow. Penis uncircumcised, tight phimotic band of retracted foreskin, glans normal appearing, healthy, non-edematous. Foreskin replaced over glans without difficulty.  Lymphatic: bilateral LE pitting edema Neurologic: Grossly intact, no focal deficits Psychiatric: Normal mood and affect  Laboratory Data:  Recent Labs    05/21/20 0831 05/21/20 1011 05/21/20 1022  WBC 18.9*  --   --   HGB 8.1* 8.2* 7.1*  HCT 23.7* 24.0* 21.0*  PLT 170  --   --     Recent Labs    05/21/20 0831 05/21/20 1011 05/21/20 1022  NA 130* 129* 128*  K 6.3* 6.1* 6.1*  CL 99 100 102  GLUCOSE 98 95 98  BUN 184* >130* >130*  CALCIUM 6.9*  --   --   CREATININE 7.23* 7.40* 7.50*     Results for orders placed or performed during the hospital encounter of 05/21/20 (from the past 24 hour(s))  Brain natriuretic peptide     Status: Abnormal   Collection Time: 05/21/20  8:30 AM  Result Value Ref Range   B Natriuretic Peptide 171.3 (H) 0.0 - 100.0 pg/mL  Sedimentation rate     Status: Abnormal   Collection Time: 05/21/20  8:30 AM  Result Value Ref Range   Sed Rate 105 (H) 0 - 16 mm/hr  CBC     Status:  Abnormal   Collection Time: 05/21/20  8:31 AM  Result Value Ref Range   WBC 18.9 (H) 4.0 - 10.5 K/uL   RBC 3.00 (L) 4.22 - 5.81 MIL/uL   Hemoglobin 8.1 (L) 13.0 - 17.0 g/dL   HCT 01.7 (L) 51.0 - 25.8 %   MCV 79.0 (L) 80.0 - 100.0 fL   MCH 27.0 26.0 - 34.0 pg   MCHC 34.2 30.0 - 36.0 g/dL   RDW 52.7 (H) 78.2 - 42.3 %   Platelets 170 150 - 400 K/uL   nRBC 0.0 0.0 - 0.2 %  Troponin I (High Sensitivity)     Status: Abnormal   Collection Time: 05/21/20  8:31 AM  Result Value Ref Range    Troponin I (High Sensitivity) 25 (H) <18 ng/L  Comprehensive metabolic panel     Status: Abnormal   Collection Time: 05/21/20  8:31 AM  Result Value Ref Range   Sodium 130 (L) 135 - 145 mmol/L   Potassium 6.3 (HH) 3.5 - 5.1 mmol/L   Chloride 99 98 - 111 mmol/L   CO2 11 (L) 22 - 32 mmol/L   Glucose, Bld 98 70 - 99 mg/dL   BUN 536 (H) 6 - 20 mg/dL   Creatinine, Ser 1.44 (H) 0.61 - 1.24 mg/dL   Calcium 6.9 (L) 8.9 - 10.3 mg/dL   Total Protein 6.1 (L) 6.5 - 8.1 g/dL   Albumin 1.7 (L) 3.5 - 5.0 g/dL   AST 30 15 - 41 U/L   ALT 38 0 - 44 U/L   Alkaline Phosphatase 297 (H) 38 - 126 U/L   Total Bilirubin 1.1 0.3 - 1.2 mg/dL   GFR, Estimated 9 (L) >60 mL/min   Anion gap 20 (H) 5 - 15  SARS Coronavirus 2 by RT PCR (hospital order, performed in [facility] hospital lab) Nasopharyngeal Nasopharyngeal Swab     Status: None   Collection Time: 05/21/20  8:45 AM   Specimen: Nasopharyngeal Swab  Result Value Ref Range   SARS Coronavirus 2 NEGATIVE NEGATIVE  C-reactive protein     Status: Abnormal   Collection Time: 05/21/20  9:50 AM  Result Value Ref Range   CRP 23.8 (H) <1.0 mg/dL  HIV Antibody (routine testing w rflx)     Status: None   Collection Time: 05/21/20  9:50 AM  Result Value Ref Range   HIV Screen 4th Generation wRfx Non Reactive Non Reactive  Lactic acid, plasma     Status: None   Collection Time: 05/21/20  9:50 AM  Result Value Ref Range   Lactic Acid, Venous 0.9 0.5 - 1.9 mmol/L  Troponin I (High Sensitivity)     Status: Abnormal   Collection Time: 05/21/20  9:50 AM  Result Value Ref Range   Troponin I (High Sensitivity) 29 (H) <18 ng/L  I-stat chem 8, ed     Status: Abnormal   Collection Time: 05/21/20 10:11 AM  Result Value Ref Range   Sodium 129 (L) 135 - 145 mmol/L   Potassium 6.1 (H) 3.5 - 5.1 mmol/L   Chloride 100 98 - 111 mmol/L   BUN >130 (H) 6 - 20 mg/dL   Creatinine, Ser 3.15 (H) 0.61 - 1.24 mg/dL   Glucose, Bld 95 70 - 99 mg/dL   Calcium, Ion 4.00 (LL) 1.15 -  1.40 mmol/L   TCO2 16 (L) 22 - 32 mmol/L   Hemoglobin 8.2 (L) 13.0 - 17.0 g/dL   HCT 86.7 (L) 61.9 - 50.9 %  Comment NOTIFIED PHYSICIAN   I-stat chem 8, ED (not at [facility] or [facility])     Status: Abnormal   Collection Time: 05/21/20 10:22 AM  Result Value Ref Range   Sodium 128 (L) 135 - 145 mmol/L   Potassium 6.1 (H) 3.5 - 5.1 mmol/L   Chloride 102 98 - 111 mmol/L   BUN >130 (H) 6 - 20 mg/dL   Creatinine, Ser 5.53 (H) 0.61 - 1.24 mg/dL   Glucose, Bld 98 70 - 99 mg/dL   Calcium, Ion 7.48 (LL) 1.15 - 1.40 mmol/L   TCO2 14 (L) 22 - 32 mmol/L   Hemoglobin 7.1 (L) 13.0 - 17.0 g/dL   HCT 27.0 (L) 78.6 - 75.4 %   Comment NOTIFIED PHYSICIAN   Urinalysis, Routine w reflex microscopic Urine, Catheterized     Status: Abnormal   Collection Time: 05/21/20  3:25 PM  Result Value Ref Range   Color, Urine AMBER (A) YELLOW   APPearance CLOUDY (A) CLEAR   Specific Gravity, Urine 1.018 1.005 - 1.030   pH 5.0 5.0 - 8.0   Glucose, UA NEGATIVE NEGATIVE mg/dL   Hgb urine dipstick LARGE (A) NEGATIVE   Bilirubin Urine NEGATIVE NEGATIVE   Ketones, ur NEGATIVE NEGATIVE mg/dL   Protein, ur 492 (A) NEGATIVE mg/dL   Nitrite NEGATIVE NEGATIVE   Leukocytes,Ua LARGE (A) NEGATIVE   RBC / HPF >50 (H) 0 - 5 RBC/hpf   WBC, UA >50 (H) 0 - 5 WBC/hpf   Bacteria, UA FEW (A) NONE SEEN   Squamous Epithelial / LPF 0-5 0 - 5   Mucus PRESENT    Recent Results (from the past 240 hour(s))  SARS Coronavirus 2 by RT PCR (hospital order, performed in [facility] hospital lab) Nasopharyngeal Nasopharyngeal Swab     Status: None   Collection Time: 05/21/20  8:45 AM   Specimen: Nasopharyngeal Swab  Result Value Ref Range Status   SARS Coronavirus 2 NEGATIVE NEGATIVE Final    Comment: (NOTE) SARS-CoV-2 target nucleic acids are NOT DETECTED.  The SARS-CoV-2 RNA is generally detectable in upper and lower respiratory specimens during the acute phase of infection. The lowest concentration of SARS-CoV-2 viral copies this assay can  detect is 250 copies / mL. A negative result does not preclude SARS-CoV-2 infection and should not be used as the sole basis for treatment or other patient management decisions.  A negative result may occur with improper specimen collection / handling, submission of specimen other than nasopharyngeal swab, presence of viral mutation(s) within the areas targeted by this assay, and inadequate number of viral copies (<250 copies / mL). A negative result must be combined with clinical observations, patient history, and epidemiological information.  Fact Sheet for Patients:   BoilerBrush.com.cy  Fact Sheet for Healthcare Providers: https://pope.com/  This test is not yet approved or  cleared by the [place] FDA and has been authorized for detection and/or diagnosis of SARS-CoV-2 by FDA under an Emergency Use Authorization (EUA).  This EUA will remain in effect (meaning this test can be used) for the duration of the COVID-19 declaration under Section 564(b)(1) of the Act, 21 U.S.C. section 360bbb-3(b)(1), unless the authorization is terminated or revoked sooner.  Performed at [facility], [address], [place], [place] [address]     Renal Function: Recent Labs    05/21/20 0831 05/21/20 1011 05/21/20 1022  CREATININE 7.23* 7.40* 7.50*   Estimated Creatinine Clearance: 13 mL/min (A) (by C-G formula based on SCr of 7.5  mg/dL (H)).  Radiologic Imaging: US RENAL  Result Date: 05/21/2020 CLINICAL DATA:  AK I EXAM: RENAL / URINARY TRACT ULTRASOUND COMPLETE COMPARISON:  May 17, 2020 FINDINGS: Right Kidney: Renal measurements: 12.5 x 5.0 x 5.6 cm = volume: 182 mL. Diffusely increased renal echogenicity. There is mild RIGHT hydronephrosis, similar in comparison to prior. Left Kidney: Renal measurements: 13.1 x 5.5 x 5.9 cm = volume: 222 mL. Renal echogenicity is diffusely increased. There is minimal LEFT hydronephrosis.  Bladder: Distended Other: Small volume ascites. Splenomegaly the spleen measuring at least 14 x 13.5 x 8 cm for a volume of 792 ML. Small LEFT pleural effusion. IMPRESSION: 1. There is mild RIGHT greater than LEFT hydronephrosis. The bladder is distended. Recommend correlation for outlet obstruction. 2. Splenomegaly. 3. Trace ascites and small LEFT pleural effusion. 4. Diffusely increased renal echogenicity as can be seen in medical renal disease. Electronically Signed   By: Meda Klinefelter MD   On: 05/21/2020 13:52   DG Chest Port 1 View  Result Date: 05/21/2020 CLINICAL DATA:  Chest pain and shortness of breath EXAM: PORTABLE CHEST 1 VIEW COMPARISON:  May 15, 2020 chest radiograph and chest CT May 18, 2020 FINDINGS: There are again noted multiple nodular lesions throughout the lungs bilaterally, several of which are cavitated. There is a small right pleural effusion. There is ill-defined airspace opacity in the right base which appears increased from recent studies. Heart size and pulmonary vascular normal. No adenopathy. Degenerative changes noted in the left shoulder. IMPRESSION: Multiple ill-defined nodular opacities of varying sizes, several of which show cavitation, likely representing multifocal septic emboli. These nodular opacities overall appear similar to findings on most recent CT. There is a small right pleural effusion with slight increase in airspace opacity in the right base, likely focus of pneumonia. Stable cardiac silhouette. Electronically Signed   By: Bretta Bang III M.D.   On: 05/21/2020 09:02    I independently reviewed the above imaging studies.  Impression/Recommendation 45 y.o. unhealthy male with endocarditis and MRSA bacteremia, AKI/ARF, urinary retention with >750cc output after foley placement and mild bilateral hydronephrosis on ultrasound. ARF likely multifactorial in this patient, given relatively mild hydronephrosis do not believe that post-renal  obstruction is the primary driver of his renal failure but could be a contributing factor. Patient with phimotic band of foreskin, was able to reduce mild paraphimosis at bedside.  - Continue foley catheter at least 7 days and until patient exhibits signs of renal recovery from AKI - Daily foley catheter care, confirm foreskin is not kept retracted over the glans as this will create risk for paraphimosis, glans edema and possible necrosis  Thea Alken 05/21/2020, 5:18 PM

## 2020-05-21 NOTE — Consult Note (Signed)
   [facility]  Total days of antibiotics: Unknown        Day        Reason for Consult: Endocarditis    Referring Physician: Dr. Raines  Active Problems:   Endocarditis   Urinary retention   Acute renal failure (HCC)   Hyperkalemia   Microcytic anemia  HPI: Steven Cortez is a 44 y.o. 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  Patient was recently seen about 10 days ago at [facility] for shortness of breath and found to have endocarditis and pulmonary septic emboli.  VQ scan showed probable PE. Culture showed MRSA. Patient was initially treated with vancomycin then switched to linezolid due to positive blood cultures while on vancomycin.  Patient left [facility] AMA after 7 days of hospitalization due to not feeling like he is getting appropriate treatment. Since then, patient has had hemoptysis, progressive leg swelling and shortness of breath with associated orthopnea.  Patient also endorses pain in his sides when he coughs.  He denied fevers/chills, diarrhea, abdominal pain, N/V or bloody stool.  Patient also endorse difficulty urinating stating that he has not been able to urinate in the past 2 days.  Patient also complains of fatigue and recent weight loss.   On admission, patient was found to be hypertensive and tachypneic with normal O2 sats.  Labs show acute renal failure with hyperkalemia, AGMA, elevated alk phos, elevated BNP and WBC of 18.9.  Chest x-ray showed multifocal septic emboli and possible pneumonia.  Patient was given IV cefepime admitted for further evaluation and management.  ID was consulted for further evaluation and management of his endocarditis.  No past medical history on file.  Allergies: No Known Allergies  Current antibiotics: Linezolid 600 mg every 12  hours  MEDICATIONS: . [START ON 05/22/2020] Chlorhexidine Gluconate Cloth  6 each Topical Q0600       No family history on file.  Review of Systems -  A complete review of system was negative except as per HPI.  OBJECTIVE: Temp:  [98.2 F (36.8 C)-98.3 F (36.8 C)] 98.2 F (36.8 C) (01/27 1150) Pulse Rate:  [86-96] 94 (01/27 1630) Resp:  [20-29] 24 (01/27 1630) BP: (155-176)/(79-101) 171/97 (01/27 1630) SpO2:  [98 %-100 %] 98 % (01/27 1630) Weight:  [74.8 kg] 74.8 kg (01/27 0832)  Physical exam Constitutional: Mildly cachectic ill-appearing middle-age man laying uncomfortably in bed. Head: Normocephalic and atraumatic Neck: Supple. JVD to the jaw line. Cardiovascular: RRR.  Systolic murmur present. 2+ BLE edema Respiratory: Tachypneic.  Mild bibasilar crackles. Abdomen: Soft.  Mild distention.  Mild ttp to the epigastric area.  Normal BS Extremities: Palpable dorsalis pedis pulses.  Normal ROM Skin: Warm and dry. Multiple erythematous macules on thighs and legs. A small area of developing ulcer on R leg. Neuro: A&Ox3.  Moves all extremities.  LABS: Results for orders placed or performed during the hospital encounter of 05/21/20 (from the past 48 hour(s))  Brain natriuretic peptide     Status: Abnormal   Collection Time: 05/21/20  8:30 AM  Result Value Ref Range   B Natriuretic Peptide 171.3 (H) 0.0 - 100.0 pg/mL    Comment: Performed at [facility], [address] [place], [place] [address]  Sedimentation rate     Status: Abnormal   Collection Time: 05/21/20  8:30 AM    Result Value Ref Range   Sed Rate 105 (H) 0 - 16 mm/hr    Comment: Performed at [facility], [address] [place], [place] [address]  CBC     Status: Abnormal   Collection Time: 05/21/20  8:31 AM  Result Value Ref Range   WBC 18.9 (H) 4.0 - 10.5 K/uL   RBC 3.00 (L) 4.22 - 5.81 MIL/uL   Hemoglobin 8.1 (L) 13.0 - 17.0 g/dL   HCT 23.7 (L) 39.0 - 52.0 %   MCV 79.0 (L) 80.0 - 100.0 fL    MCH 27.0 26.0 - 34.0 pg   MCHC 34.2 30.0 - 36.0 g/dL   RDW 15.7 (H) 11.5 - 15.5 %   Platelets 170 150 - 400 K/uL   nRBC 0.0 0.0 - 0.2 %    Comment: Performed at [facility], [address] [place], [place] [address]  Troponin I (High Sensitivity)     Status: Abnormal   Collection Time: 05/21/20  8:31 AM  Result Value Ref Range   Troponin I (High Sensitivity) 25 (H) <18 ng/L    Comment: (NOTE) Elevated high sensitivity troponin I (hsTnI) values and significant  changes across serial measurements may suggest ACS but many other  chronic and acute conditions are known to elevate hsTnI results.  Refer to the Links section for chest pain algorithms and additional  guidance. Performed at [facility], [address] [place], [place] [address]   Comprehensive metabolic panel     Status: Abnormal   Collection Time: 05/21/20  8:31 AM  Result Value Ref Range   Sodium 130 (L) 135 - 145 mmol/L   Potassium 6.3 (HH) 3.5 - 5.1 mmol/L    Comment: NO VISIBLE HEMOLYSIS CRITICAL RESULT CALLED TO, READ BACK BY AND VERIFIED WITH: BILLY MONTE,RN AT 0950 05/21/2020 BY ZBEECH.    Chloride 99 98 - 111 mmol/L   CO2 11 (L) 22 - 32 mmol/L   Glucose, Bld 98 70 - 99 mg/dL    Comment: Glucose reference range applies only to samples taken after fasting for at least 8 hours.   BUN 184 (H) 6 - 20 mg/dL   Creatinine, Ser 7.23 (H) 0.61 - 1.24 mg/dL   Calcium 6.9 (L) 8.9 - 10.3 mg/dL   Total Protein 6.1 (L) 6.5 - 8.1 g/dL   Albumin 1.7 (L) 3.5 - 5.0 g/dL   AST 30 15 - 41 U/L   ALT 38 0 - 44 U/L   Alkaline Phosphatase 297 (H) 38 - 126 U/L   Total Bilirubin 1.1 0.3 - 1.2 mg/dL   GFR, Estimated 9 (L) >60 mL/min    Comment: (NOTE) Calculated using the CKD-EPI Creatinine Equation (2021)    Anion gap 20 (H) 5 - 15    Comment: Performed at [facility], [address] [place],  [address]  SARS Coronavirus 2 by RT PCR (hospital order, performed in [place] hospital lab)  Nasopharyngeal Nasopharyngeal Swab     Status: None   Collection Time: 05/21/20  8:45 AM   Specimen: Nasopharyngeal Swab  Result Value Ref Range   SARS Coronavirus 2 NEGATIVE NEGATIVE    Comment: (NOTE) SARS-CoV-2 target nucleic acids are NOT DETECTED.  The SARS-CoV-2 RNA is generally detectable in upper and lower respiratory specimens during the acute phase of infection. The lowest concentration of SARS-CoV-2 viral copies this assay can detect is 250 copies / mL. A negative result does not preclude SARS-CoV-2 infection and   should not be used as the sole basis for treatment or other patient management decisions.  A negative result may occur with improper specimen collection / handling, submission of specimen other than nasopharyngeal swab, presence of viral mutation(s) within the areas targeted by this assay, and inadequate number of viral copies (<250 copies / mL). A negative result must be combined with clinical observations, patient history, and epidemiological information.  Fact Sheet for Patients:   https://www.fda.gov/media/136312/download  Fact Sheet for Healthcare Providers: https://www.fda.gov/media/136313/download  This test is not yet approved or  cleared by the [place] FDA and has been authorized for detection and/or diagnosis of SARS-CoV-2 by FDA under an Emergency Use Authorization (EUA).  This EUA will remain in effect (meaning this test can be used) for the duration of the COVID-19 declaration under Section 564(b)(1) of the Act, 21 U.S.C. section 360bbb-3(b)(1), unless the authorization is terminated or revoked sooner.  Performed at [facility], [address] [place], [place] [address]   C-reactive protein     Status: Abnormal   Collection Time: 05/21/20  9:50 AM  Result Value Ref Range   CRP 23.8 (H) <1.0 mg/dL    Comment: Performed at [facility], [address] [place], [place] [address]  HIV Antibody (routine testing w rflx)      Status: None   Collection Time: 05/21/20  9:50 AM  Result Value Ref Range   HIV Screen 4th Generation wRfx Non Reactive Non Reactive    Comment: Performed at [facility], [address] [place], [place] [address]  Lactic acid, plasma     Status: None   Collection Time: 05/21/20  9:50 AM  Result Value Ref Range   Lactic Acid, Venous 0.9 0.5 - 1.9 mmol/L    Comment: Performed at [facility], [address] [place], [place] [address]  Troponin I (High Sensitivity)     Status: Abnormal   Collection Time: 05/21/20  9:50 AM  Result Value Ref Range   Troponin I (High Sensitivity) 29 (H) <18 ng/L    Comment: (NOTE) Elevated high sensitivity troponin I (hsTnI) values and significant  changes across serial measurements may suggest ACS but many other  chronic and acute conditions are known to elevate hsTnI results.  Refer to the "Links" section for chest pain algorithms and additional  guidance. Performed at  Hospital Lab, [address] [place], [place] [address]   I-stat chem 8, ed     Status: Abnormal   Collection Time: 05/21/20 10:11 AM  Result Value Ref Range   Sodium 129 (L) 135 - 145 mmol/L   Potassium 6.1 (H) 3.5 - 5.1 mmol/L   Chloride 100 98 - 111 mmol/L   BUN >130 (H) 6 - 20 mg/dL   Creatinine, Ser 7.40 (H) 0.61 - 1.24 mg/dL   Glucose, Bld 95 70 - 99 mg/dL    Comment: Glucose reference range applies only to samples taken after fasting for at least 8 hours.   Calcium, Ion 0.89 (LL) 1.15 - 1.40 mmol/L   TCO2 16 (L) 22 - 32 mmol/L   Hemoglobin 8.2 (L) 13.0 - 17.0 g/dL   HCT 24.0 (L) 39.0 - 52.0 %   Comment NOTIFIED PHYSICIAN   I-stat chem 8, ED (not at [facility] or [facility])     Status: Abnormal   Collection Time: 05/21/20 10:22 AM  Result Value Ref Range   Sodium 128 (L) 135 - 145 mmol/L   Potassium 6.1 (H)   3.5 - 5.1 mmol/L   Chloride 102 98 - 111 mmol/L   BUN >130 (H) 6 - 20 mg/dL   Creatinine, Ser 7.50 (H) 0.61 - 1.24 mg/dL   Glucose, Bld 98 70 - 99 mg/dL     Comment: Glucose reference range applies only to samples taken after fasting for at least 8 hours.   Calcium, Ion 0.74 (LL) 1.15 - 1.40 mmol/L   TCO2 14 (L) 22 - 32 mmol/L   Hemoglobin 7.1 (L) 13.0 - 17.0 g/dL   HCT 21.0 (L) 39.0 - 52.0 %   Comment NOTIFIED PHYSICIAN   Urine rapid drug screen (hosp performed)     Status: Abnormal   Collection Time: 05/21/20  3:25 PM  Result Value Ref Range   Opiates POSITIVE (A) NONE DETECTED   Cocaine NONE DETECTED NONE DETECTED   Benzodiazepines NONE DETECTED NONE DETECTED   Amphetamines NONE DETECTED NONE DETECTED   Tetrahydrocannabinol NONE DETECTED NONE DETECTED   Barbiturates NONE DETECTED NONE DETECTED    Comment: (NOTE) DRUG SCREEN FOR MEDICAL PURPOSES ONLY.  IF CONFIRMATION IS NEEDED FOR ANY PURPOSE, NOTIFY LAB WITHIN 5 DAYS.  LOWEST DETECTABLE LIMITS FOR URINE DRUG SCREEN Drug Class                     Cutoff (ng/mL) Amphetamine and metabolites    1000 Barbiturate and metabolites    200 Benzodiazepine                 726 Tricyclics and metabolites     300 Opiates and metabolites        300 Cocaine and metabolites        300 THC                            50 Performed at [facility], [place] [address] [place], [place] [address]   Urinalysis, Routine w reflex microscopic Urine, Catheterized     Status: Abnormal   Collection Time: 05/21/20  3:25 PM  Result Value Ref Range   Color, Urine AMBER (A) YELLOW    Comment: BIOCHEMICALS MAY BE AFFECTED BY COLOR   APPearance CLOUDY (A) CLEAR   Specific Gravity, Urine 1.018 1.005 - 1.030   pH 5.0 5.0 - 8.0   Glucose, UA NEGATIVE NEGATIVE mg/dL   Hgb urine dipstick LARGE (A) NEGATIVE   Bilirubin Urine NEGATIVE NEGATIVE   Ketones, ur NEGATIVE NEGATIVE mg/dL   Protein, ur 100 (A) NEGATIVE mg/dL   Nitrite NEGATIVE NEGATIVE   Leukocytes,Ua LARGE (A) NEGATIVE   RBC / HPF >50 (H) 0 - 5 RBC/hpf   WBC, UA >50 (H) 0 - 5 WBC/hpf   Bacteria, UA FEW (A) NONE SEEN   Squamous Epithelial / LPF  0-5 0 - 5   Mucus PRESENT     Comment: Performed at [facility], [address], [place], [place] [address]  Protein / creatinine ratio, urine     Status: None (Preliminary result)   Collection Time: 05/21/20  3:25 PM  Result Value Ref Range   Creatinine, Urine 256.24 mg/dL    Comment: Performed at [facility], [place] [address] [place], [place] [address]   Total Protein, Urine PENDING mg/dL   Protein Creatinine Ratio PENDING 0.00 - 0.15 mg/mg[Cre]    MICRO:  IMAGING: US RENAL  Result Date: 05/21/2020 CLINICAL DATA:  AK I EXAM: RENAL / URINARY TRACT ULTRASOUND COMPLETE COMPARISON:  May 17, 2020 FINDINGS: Right Kidney: Renal measurements: 12.5 x 5.0 x 5.6 cm = volume: 182 mL. Diffusely increased renal echogenicity. There is mild RIGHT hydronephrosis, similar in comparison to prior. Left Kidney: Renal measurements: 13.1 x 5.5 x 5.9 cm = volume: 222 mL. Renal echogenicity is diffusely increased. There is minimal LEFT hydronephrosis. Bladder: Distended Other: Small volume ascites. Splenomegaly the spleen measuring at least 14 x 13.5 x 8 cm for a volume of 792 ML. Small LEFT pleural effusion. IMPRESSION: 1. There is mild RIGHT greater than LEFT hydronephrosis. The bladder is distended. Recommend correlation for outlet obstruction. 2. Splenomegaly. 3. Trace ascites and small LEFT pleural effusion. 4. Diffusely increased renal echogenicity as can be seen in medical renal disease. Electronically Signed   By: Valentino Saxon MD   On: 05/21/2020 13:52   DG Chest Port 1 View  Result Date: 05/21/2020 CLINICAL DATA:  Chest pain and shortness of breath EXAM: PORTABLE CHEST 1 VIEW COMPARISON:  May 15, 2020 chest radiograph and chest CT May 18, 2020 FINDINGS: There are again noted multiple nodular lesions throughout the lungs bilaterally, several of which are cavitated. There is a small right pleural effusion. There is ill-defined airspace opacity in the right base which appears  increased from recent studies. Heart size and pulmonary vascular normal. No adenopathy. Degenerative changes noted in the left shoulder. IMPRESSION: Multiple ill-defined nodular opacities of varying sizes, several of which show cavitation, likely representing multifocal septic emboli. These nodular opacities overall appear similar to findings on most recent CT. There is a small right pleural effusion with slight increase in airspace opacity in the right base, likely focus of pneumonia. Stable cardiac silhouette. Electronically Signed   By: Lowella Grip III M.D.   On: 05/21/2020 09:02    HISTORICAL MICRO/IMAGING MRSA from outside hospital  Assessment/Plan:  Steven Cortez is a 45 y.o. 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 Patient found to be in acute renal failure, volume overloaded state urinary retention on admission.  Recently treated for MRSA bacteremia that was unresponsive to vancomycin therapy.   Patient afebrile. Blood cultures collected in the ED. Elevated inflammatory markers. Received empirical linezolid and cefepime in the ED. Will avoid vancomycin due ARF.   Discontinue cefepime. Continue linezolid for now.  We will follow on blood culture and records from [facility] ID will continue to follow.

## 2020-05-21 NOTE — Consult Note (Addendum)
Nephrology Consult   Requesting provider: Susy Frizzle Service requesting consult: ER Reason for consult: AKI   Assessment/Recommendations: Steven Cortez is a/an 45 y.o. male with a past medical history Hep C and IVDU who present w/ likely endocarditis and ARF with electrolyte abnormalities  Severe AKI/ARF: w/ multiple electrolyte abnormalities and mild uremia. Will need to start dialysis likely tomorrow. Differential is ATN, renal infarction from septic emboli, infection associated GN (bacterial and hepc), as well as other causes -IR consulted for temp cath -Dialysis #1 tomorrow and again on Saturday -electrolyte derangement management as below -UA, UPC, c3, c4 -Renal imaging w/ CT if not provided from [place] to look for abscess, obstruction, or infarct; may need to get CTA but holding off for now given AKI  -Continue to monitor daily Cr, Dose meds for GFR -Monitor Daily I/Os, Daily weight  -Maintain MAP>65 for optimal renal perfusion.  -Avoid nephrotoxic medications including NSAIDs and Vanc/Zosyn combo  Hyperkalemia: 2/2 AKI -S/p Lokelma -Repeat 10g dose -Repeat PM BMP -Redose lokelma if K >6 -Dialysis tomorrow  Hypocalcemia: 2/2 repletion -f/u on evening BMP -Redose calcium gluconate if needed  Hep C: not treated -Obtain PCR to determine viral load -Consider ID consult  Hyponatremia: 2/2 aki. Will improve with dialysis  Endocarditis: likely based on history, imaging, and exam -Abx per primary team and ID -Obtain outside records -F/u cultures  Hypertension: related to volume excess, manage with dialysis  Anemia due to inflammation: transfuse as needed   Recommendations conveyed to primary service.    [name] 05/21/2020 1:14 PM   _____________________________________________________________________________________ CC: AKI  History of Present Illness: Steven Cortez is a/an 45 y.o. male with a past medical history  of IVDU and hepatitis C who presents with renal failure and likely endocarditis.  Patient presented to the hospital today after leaving AMA from [facility] 2 days ago. No records are yet available from [facility] but patient states he was told he had pneumonia and an infection in his heart. He also heard his kidneys weren't "working at 100%" but knows few other details. Patient had presented to [facility] for cough and chills. He has also noted hemoptysis, fevers, muscle aches, chest pain, SOB, abdominal pain, nausea. He uses IV roxi but denies cocaine use or other drugs. Known hep c but no treatment. His chest pain is sharp and pleuritic. He has been very tired and over the past couple days his legs have swollen significantly. He has not urinated in 2 days. He had a bladder scan today with ~300ccs.   In the ER his Crt was noted to be 7 and his K was >6. He received lokelma and shifting agents. His vitals have been relatively reassuring with some hypertension.   Medications:  Current Facility-Administered Medications  Medication Dose Route Frequency Provider Last Rate Last Admin  . ceFEPIme (MAXIPIME) 2 g in sodium chloride 0.9 % 100 mL IVPB  2 g Intravenous Q8H Patel, [place], PA-C   Stopped at 05/21/20 1109  . [START ON 05/22/2020] Chlorhexidine Gluconate Cloth 2 % PADS 6 each  6 each Topical Q0600 Darnell Level, MD      . heparin ADULT infusion 100 units/mL (25000 units/21mL)  1,300 Units/hr Intravenous Continuous Mancheril, Candis Schatz, RPH      . linezolid (ZYVOX) IVPB 600 mg  600 mg Intravenous Q12H Farrel Gordon, PA-C   Stopped at 05/21/20 1209   No current outpatient medications on file.     ALLERGIES Patient has no known allergies.  MEDICAL HISTORY No past medical history on file. Hep C and IVDU  SOCIAL HISTORY Social History   Socioeconomic History  . Marital status: Single    Spouse name: Not on file  . Number of children: Not on file  . Years of education: Not on file  .  Highest education level: Not on file  Occupational History  . Not on file  Tobacco Use  . Smoking status: Not on file  . Smokeless tobacco: Not on file  Substance and Sexual Activity  . Alcohol use: Not on file  . Drug use: Not on file  . Sexual activity: Not on file  Other Topics Concern  . Not on file  Social History Narrative  . Not on file   Social Determinants of Health   Financial Resource Strain: Not on file  Food Insecurity: Not on file  Transportation Needs: Not on file  Physical Activity: Not on file  Stress: Not on file  Social Connections: Not on file  Intimate Partner Violence: Not on file     FAMILY HISTORY Denies history of kidney disease. Mother and Father without known medical problems   Review of Systems: 12 systems reviewed Otherwise as per HPI, all other systems reviewed and negative  Physical Exam: Vitals:   05/21/20 1150 05/21/20 1230  BP: (!) 160/94 (!) 172/99  Pulse: 92 96  Resp: (!) 22 (!) 25  Temp: 98.2 F (36.8 C)   SpO2: 98% 100%   No intake/output data recorded. No intake or output data in the 24 hours ending 05/21/20 1314 General: ill appearing, sitting in bed HEENT: anicteric sclera, oropharynx clear without lesions CV: normal rate, no rub, 2+ pitting edema in bilateral lower extremities Lungs: crackles present bilaterally, mild iwob Abd: soft, tender to palpation diffusely, mild distention Skin: few scatters excoriations, no obvious nodes or splinter hemorrhages Psych: alert, engaged, appropriate mood and affect Musculoskeletal: no obvious deformities Neuro: normal speech, no gross focal deficits   Test Results Reviewed Lab Results  Component Value Date   NA 128 (L) 05/21/2020   K 6.1 (H) 05/21/2020   CL 102 05/21/2020   CO2 11 (L) 05/21/2020   BUN >130 (H) 05/21/2020   CREATININE 7.50 (H) 05/21/2020   CALCIUM 6.9 (L) 05/21/2020   ALBUMIN 1.7 (L) 05/21/2020     I have reviewed all relevant outside healthcare  records related to the patient's current hospitalization

## 2020-05-21 NOTE — Hospital Course (Addendum)
OUTPATIENT F/U: - IDA? - discontinue iron vs. 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 He should follow up with me Jadene Pierini  in clinic in 2 weeks or whenever he gets out of the hospital.

## 2020-05-21 NOTE — ED Triage Notes (Signed)
Pt arrives via [facility]. EMS c/o CP for the last two weeks. Pt was seen at [facility] for evaluation of CP with "infection in my heart", PNA, and kidney failure. Pt is weak on arrival, has edema bilateral lower extremities, states he's been coughing up blood for approximately two days.   #18 R AC by EMS   EMS last VS - 186/102, HR 92, RR 16, 99% on RA, CBG 120.

## 2020-05-21 NOTE — Progress Notes (Signed)
ANTICOAGULATION CONSULT NOTE - Initial Consult  Pharmacy Consult for heparin Indication: pulmonary embolus  No Known Allergies  Patient Measurements: Height: 5\' 10"  (177.8 cm) Weight: 74.8 kg (165 lb) IBW/kg (Calculated) : 73 Heparin Dosing Weight: 74.8kg  Vital Signs: Temp: 98.2 F (36.8 C) (01/27 1150) Temp Source: Oral (01/27 1150) BP: 171/97 (01/27 1630) Pulse Rate: 94 (01/27 1630)  Labs: Recent Labs    05/21/20 0831 05/21/20 0950 05/21/20 1011 05/21/20 1022 05/21/20 1850  HGB 8.1*  --  8.2* 7.1*  --   HCT 23.7*  --  24.0* 21.0*  --   PLT 170  --   --   --   --   HEPARINUNFRC  --   --   --   --  <0.10*  CREATININE 7.23*  --  7.40* 7.50*  --   TROPONINIHS 25* 29*  --   --   --     Estimated Creatinine Clearance: 13 mL/min (A) (by C-G formula based on SCr of 7.5 mg/dL (H)).   Medical History: No past medical history on file.  Medications:  (Not in a hospital admission)   Assessment: 76 YOM who presents with chest pain. He was diagnosed with tricuspid valve endocarditis at [facility] and a VQ scan was suspicious for a PE. At OSH, he was started on treatment dose Lovenox which was stopped after 3 doses due to significant thrombocytopenia. He now presents with some hemoptysis which the medical team thinks could be secondary to a possible PE.   Pharmacy consulted to start IV heparin. Hgb is low at 7.1 but Plt is wnl. Patient has significant AKI with a SCr of 7.5. Will aim for a heparin level of ~0.5   Goal of Therapy:  Heparin level 0.3-0.7 units/ml Monitor platelets by anticoagulation protocol: Yes   Plan:  - Heparin level subtherapeutic at <0.1 -Increase Heparin drip to 1600 units/hr. No bolus in the setting of hemoptysis prior to admission and anemia  -F/u 6 hr HL and aim for HL of ~0.5 -Daily CBC, HL -Monitor closely for s/s of worsening hemoptysis and/or anemia   [facility], PharmD., BCPS Clinical Pharmacist Please refer to [facility] for  unit-specific pharmacist

## 2020-05-21 NOTE — Progress Notes (Signed)
ANTICOAGULATION CONSULT NOTE - Initial Consult  Pharmacy Consult for heparin Indication: pulmonary embolus  Not on File  Patient Measurements: Height: 5\' 10"  (177.8 cm) Weight: 74.8 kg (165 lb) IBW/kg (Calculated) : 73 Heparin Dosing Weight: 74.8kg  Vital Signs: Temp: 98.3 F (36.8 C) (01/27 0834) Temp Source: Oral (01/27 0834) BP: 167/92 (01/27 1020) Pulse Rate: 95 (01/27 1020)  Labs: Recent Labs    05/21/20 0831 05/21/20 1022  HGB 8.1* 7.1*  HCT 23.7* 21.0*  PLT 170  --   CREATININE 7.23* 7.50*  TROPONINIHS 25*  --     Estimated Creatinine Clearance: 13 mL/min (A) (by C-G formula based on SCr of 7.5 mg/dL (H)).   Medical History: No past medical history on file.  Medications:  (Not in a hospital admission)   Assessment: 58 YOM who presents with chest pain. He was diagnosed with tricuspid valve endocarditis at [facility] and a VQ scan was suspicious for a PE. At OSH, he was started on treatment dose Lovenox which was stopped after 3 doses due to significant thrombocytopenia. He now presents with some hemoptysis which the medical team thinks could be secondary to a possible PE.   Pharmacy consulted to start IV heparin. Hgb is low at 7.1 but Plt is wnl. Patient has significant AKI with a SCr of 7.5. Will aim for a heparin level of ~0.5   Goal of Therapy:  Heparin level 0.3-0.7 units/ml Monitor platelets by anticoagulation protocol: Yes   Plan:  -Start IV heparin at 1300 units/hr. No bolus in the setting of hemoptysis and anemia  -F/u 6 hr HL and aim for HL of ~0.5 -Daily CBC, HL -Monitor closely for s/s of worsening hemoptysis and/or anemia   [facility], PharmD., BCPS, BCCCP Clinical Pharmacist Please refer to [facility] for unit-specific pharmacist

## 2020-05-21 NOTE — H&P (Addendum)
Date: 05/21/2020               Patient Name:  Steven Cortez MRN: [id number]  DOB: [birth year] Age / Sex: 45 y.o., male   PCP: Pcp, No         Medical Service: Internal Medicine Teaching Service         Attending Physician: Dr. Rebeca Alert, Raynaldo Opitz, MD    First Contact: Dr. Wynetta Emery Pager: [phone]  Second Contact: Dr. 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 He reports bilateral leg swelling and discomfort since 2 days ago.  He has had cough and shortness of breath for past 2 weeks that mildly worsened recently.  Patient initially had some shortness of breath this started about 2 weeks ago, he went to [facility] about 10 days ago and found to have endocarditis and pulmonary septic emboli stat pneumonia and probable PE per V/Q scan.  Document from [place] is not scanned in the chart yet but per ED provider, patient initially treated with vancomycin in [place] that then switched to linezolid given blood culture remained positive despite being on vancomycin.  Patient left [facility] AMA 3 days ago because he did not feel his getting enough treatment.  Since then, he is still had shortness of breath and cough some bloody sputum x2.  For past 2 days, he has had progressive swelling of his legs and feels more short of breath.  Shortness of breath is worse when he is flat.  He reports chest pain when he coughs or when he takes deep breath.  He denied nausea and vomiting.  No abdominal pain.  No diarrhea.  No bloody stool.  No constipation.  He endorses that he did not pee for past 2 days.  However, he mentions that he  really feels that he needs to pee but he cannot.  He states that he is very weak, was unable to to get out of bed this morning.  Denies any fever or chills.  No presyncopal or syncopal episode.  No back pain.  He endorses that he has lost some weight past couple of months.  He says he was told to have hepatitis C several years ago but never got treatment.  He has not seen a doctor for a long time until recent hospitalization in [place].  He says he injects Roxie.  The last injection was couple of weeks ago.  In ED, afebrile with temperature 98.3, hypertensive with BP 173/99, heart rate 91, tachypneic with respiratory rate of 25, O2 saturation 99% at room air. Labs were remarkable for BUN 184, Cr 7.23, K 6.3, HCO3 11, Anion gap 20, alkaline phosphatase 297, Alb 1.7, Ca 6.9, BNP 171, ESR 105, BNP 171,Troponin 25, WBC 18.9, Hb 8.1. CXR showed multifocal septic emboli similar to findings on most recent CT. also likely focus of pneumonia. Patient received IV cefepime, IV flunisolide, IV heparin, Lokelma, calcium gluconate in ED.  Nephrology was consulted for acute kidney renal failure and electrolytes abnormality. IMTS was consulted for admission.  Meds:  Current Meds  Medication Sig   [DISCONTINUED] acetaminophen (TYLENOL) 325 MG tablet Take 650  mg by mouth every 4 (four) hours as needed for pain.     Allergies: Allergies as of 05/21/2020   (No Known Allergies)   No past medical history on file.  Family History: No family history on file.   Social History: Patient lives with his mother and brother. He endorses smoking a pack of cigarette every day and IV drug use (injects Roxie). Denies any current or prior history of alcohol use.  Review of Systems: A complete ROS was negative except as per HPI.   Physical Exam: Blood pressure (!) 171/97, pulse 94, temperature 98.2 F (36.8 C), temperature source Oral, resp. rate (!) 24, height 5' 10"  (1.778 m), weight 74.8 kg, SpO2 98 %. Constitutional:  Thin and ill-appearing. Tachypneic but not in acute distress.  HENT: Poor dentition Head: Normocephalic and atraumatic.  Eyes: EOM nl Cardiovascular: RRR, nl W6F6, 2/6 systolic murmur at over LSB in T area, bilateral 2+ lower extremity pitting edema Respiratory: Tachypneic, No accessory muscle use, fine crackle at bases of lungs and diffuse rhonchi and fine crackles, no wheezes.  GI: Soft. Mildly distended but soft, mild general abdominal discomfort with palpation Neurological: Is alert and oriented x 3 Skin: Not diaphoretic. Psychiatric: Normal mood and affect. Behavior is normal. Judgment and thought content normal.    EKG: personally reviewed my interpretation is peaked T wave, normal sinus rhythm.   CXR: personally reviewed my interpretation is diffuse multifocal patchy opacities  Assessment & Plan by Problem: Active Problems:   Endocarditis   Urinary retention   Acute renal failure (HCC)   Hyperkalemia   Microcytic anemia   Recent Dx of endocarditis in setting of IV drug use Pulmonary septic emboli/PNA Recent MRSA bacteriemia (Current BC pending) Recently diagnosed with endocarditis and bacteremia, septic emboli in [facility] (He spend ~ 10 days there but left AMA 3 days ago-awaiting for more document from [facility])  Per ED provider who got the report from [place], patient did not respond to Vancomycin in [place] [facility] remained positive) and switched to Linezolid before he left AMA.  Per echo done on 04/26/2020: He had endocarditis of the septal leaflet of the tricuspid valve.   CXR 05/12/2020 in [place], showed patchy bilateral peripheral opacities concerning for multifocal pneumonia.  Did have increased creatinine function,  (I am not aware of his creatinine number awaiting further document from [place]). He had a VQ scan done in [place] the finding was suspicious for PE. Did get another CT on January 24 which showed increased size and cavitation of bilateral  pulmonary nodules, suspicious of septic emboli.     -Received Linezolid and Cefepime in ED. Will consult ID. Appreciate recommendation -Repeat Echo -F/u blood culture -Covid negative -Will admit in progressive -Hypertensive but otherwise vitals are currently stable -cardiac monitoring   AKI/ARF, hyperkalemia, high AGMA, Volume overload  Patient presented with 2-3 days of lower extremity pain and bilateral pain. Did not urinate since 2 days ago but he feels he needs to go but can not pee. Found to have sever AKI/ARF, AGMA and hyperkalemia: Cr 7.23 (Kidney function was nl on 2017. Awaiting documents from [facility] for Cr number last week). BUN 184, K 6.1.  He is feeling weak but is not altered or confused.  Has bilateral 2-3 + lower extremity edema on exam. Received Lokelma, Ca gluconate and in ED.   Sever AKI/ARF can be in setting of bacteremia, probable septic emboli also post renal.   -Nephrology consulted, appreciate recommendations -Monitor kidney function closely -Strict  I&Os  -Avoid nephrotoxins (Of note, patient has receive 1 dose of non renally adjusted dose of cefepime. Will hold Cefepime for now, pending ID rec) -Appreciate nephrology recommendation -f/u renal US and CT renal -Another dose of Lokelma per nephro and repeat BMP. Will do HD likely tomorrow, will follow nephro rec.  -Pt appears to have dilated bladder in [place] and has urinary retention. Can have component of post renal -Foley cath (I talked to RN to place the foley soon and he will document the output) -Foley cath>750 cc output after placing foley.  -ADDENDUM: Consulting urology for probable paraphimosis. No acute swelling of head of penis but consulted urology who will come and see the patient. -UA is pending -Renal US is pending -repeat renal function panel this PM for K -Strict I/Os -Fluid restriction  CMP Latest Ref Rng & Units 05/21/2020 05/21/2020 05/21/2020  Glucose 70 - 99 mg/dL 98 95 98  BUN 6 - 20  mg/dL >130(H) >130(H) 184(H)  Creatinine 0.61 - 1.24 mg/dL 7.50(H) 7.40(H) 7.23(H)  Sodium 135 - 145 mmol/L 128(L) 129(L) 130(L)  Potassium 3.5 - 5.1 mmol/L 6.1(H) 6.1(H) 6.3(HH)  Chloride 98 - 111 mmol/L 102 100 99  CO2 22 - 32 mmol/L - - 11(L)  Calcium 8.9 - 10.3 mg/dL - - 6.9(L)  Total Protein 6.5 - 8.1 g/dL - - 6.1(L)  Total Bilirubin 0.3 - 1.2 mg/dL - - 1.1  Alkaline Phos 38 - 126 U/L - - 297(H)  AST 15 - 41 U/L - - 30  ALT 0 - 44 U/L - - 38    Presumed PE: V/Q sacn in [place] reported possible PE. Patient reports episode of blood in his cough after discharge. Unable to obtain CTA in ED due to severe AKI. Started on IV heparin in ED.  -Will continue IV Heparin for now   Microcytic anemia: Will order labs including Retic, Iron panel,.. -CBC this PM and daily   Weight loss: IV drug use: last use was 2 weeks ago.     Reported hepatitis C: Untreated per patient   -HCV Ab and if positive, check viral load -Consider RUQ [place] -ALKP is elevated. No abdominal pain. Will monitor on CMP tomorrow   Diet: renal IV fluid: none VTE ppx: hep gtt Code status: full code  Dispo: Admit patient to Inpatient with expected length of stay greater than 2 midnights.  SignedDewayne Hatch, MD 05/21/2020, 5:03 PM  Pager: [phone] After 5pm on weekdays and 1pm on weekends: On Call pager: [phone]

## 2020-05-21 NOTE — ED Provider Notes (Signed)
Patient seen and examined, agree with assessment and plan by APP. Patient with known infectious endocarditis from IVDA recently spent ~10 days at [facility] but left AMA. Cultures and echo from that visit have been reviewed. He had MRSA that was not responding to Vanc (cultures remained positive) so pharmacy recommends Linezolid. He has AKI and hyperkalemia, nephrology consulted and recommends CaGluc and Lokelma. Medicine to admit.    Pollyann Savoy, MD 05/21/20 [phone]

## 2020-05-22 ENCOUNTER — Inpatient Hospital Stay (HOSPITAL_COMMUNITY): Payer: Medicaid Other

## 2020-05-22 ENCOUNTER — Encounter (HOSPITAL_COMMUNITY): Payer: Self-pay | Admitting: Internal Medicine

## 2020-05-22 DIAGNOSIS — N179 Acute kidney failure, unspecified: Secondary | ICD-10-CM

## 2020-05-22 DIAGNOSIS — I330 Acute and subacute infective endocarditis: Principal | ICD-10-CM

## 2020-05-22 DIAGNOSIS — I079 Rheumatic tricuspid valve disease, unspecified: Secondary | ICD-10-CM

## 2020-05-22 DIAGNOSIS — R600 Localized edema: Secondary | ICD-10-CM

## 2020-05-22 DIAGNOSIS — I38 Endocarditis, valve unspecified: Secondary | ICD-10-CM

## 2020-05-22 DIAGNOSIS — I2690 Septic pulmonary embolism without acute cor pulmonale: Secondary | ICD-10-CM

## 2020-05-22 DIAGNOSIS — E875 Hyperkalemia: Secondary | ICD-10-CM

## 2020-05-22 DIAGNOSIS — R339 Retention of urine, unspecified: Secondary | ICD-10-CM

## 2020-05-22 DIAGNOSIS — A4902 Methicillin resistant Staphylococcus aureus infection, unspecified site: Secondary | ICD-10-CM | POA: Diagnosis present

## 2020-05-22 HISTORY — PX: IR FLUORO GUIDE CV LINE RIGHT: IMG2283

## 2020-05-22 HISTORY — PX: IR US GUIDE VASC ACCESS RIGHT: IMG2390

## 2020-05-22 HISTORY — DX: Acute kidney failure, unspecified: N17.9

## 2020-05-22 LAB — ABO/RH: ABO/RH(D): O POS

## 2020-05-22 LAB — BASIC METABOLIC PANEL
Anion gap: 17 — ABNORMAL HIGH (ref 5–15)
BUN: 171 mg/dL — ABNORMAL HIGH (ref 6–20)
CO2: 13 mmol/L — ABNORMAL LOW (ref 22–32)
Calcium: 6.5 mg/dL — ABNORMAL LOW (ref 8.9–10.3)
Chloride: 100 mmol/L (ref 98–111)
Creatinine, Ser: 4.81 mg/dL — ABNORMAL HIGH (ref 0.61–1.24)
GFR, Estimated: 14 mL/min — ABNORMAL LOW (ref >60)
Glucose, Bld: 109 mg/dL — ABNORMAL HIGH (ref 70–99)
Potassium: 5.3 mmol/L — ABNORMAL HIGH (ref 3.5–5.1)
Sodium: 130 mmol/L — ABNORMAL LOW (ref 135–145)

## 2020-05-22 LAB — COMPREHENSIVE METABOLIC PANEL
ALT: 37 U/L (ref 0–44)
AST: 39 U/L (ref 15–41)
Albumin: 1.7 g/dL — ABNORMAL LOW (ref 3.5–5.0)
Alkaline Phosphatase: 255 U/L — ABNORMAL HIGH (ref 38–126)
Anion gap: 18 — ABNORMAL HIGH (ref 5–15)
BUN: 184 mg/dL — ABNORMAL HIGH (ref 6–20)
CO2: 12 mmol/L — ABNORMAL LOW (ref 22–32)
Calcium: 6.5 mg/dL — ABNORMAL LOW (ref 8.9–10.3)
Chloride: 99 mmol/L (ref 98–111)
Creatinine, Ser: 6.09 mg/dL — ABNORMAL HIGH (ref 0.61–1.24)
GFR, Estimated: 11 mL/min — ABNORMAL LOW (ref >60)
Glucose, Bld: 115 mg/dL — ABNORMAL HIGH (ref 70–99)
Potassium: 5.7 mmol/L — ABNORMAL HIGH (ref 3.5–5.1)
Sodium: 129 mmol/L — ABNORMAL LOW (ref 135–145)
Total Bilirubin: 1.2 mg/dL (ref 0.3–1.2)
Total Protein: 5.9 g/dL — ABNORMAL LOW (ref 6.5–8.1)

## 2020-05-22 LAB — ECHOCARDIOGRAM COMPLETE
Area-P 1/2: 2.91 cm2
Height: 70 in
S' Lateral: 3.7 cm
Weight: 2885.38 oz

## 2020-05-22 LAB — CBC
HCT: 18.3 % — ABNORMAL LOW (ref 39.0–52.0)
Hemoglobin: 6.7 g/dL — CL (ref 13.0–17.0)
MCH: 27.6 pg (ref 26.0–34.0)
MCHC: 36.6 g/dL — ABNORMAL HIGH (ref 30.0–36.0)
MCV: 75.3 fL — ABNORMAL LOW (ref 80.0–100.0)
Platelets: 160 10*3/uL (ref 150–400)
RBC: 2.43 MIL/uL — ABNORMAL LOW (ref 4.22–5.81)
RDW: 15.1 % (ref 11.5–15.5)
WBC: 14.3 10*3/uL — ABNORMAL HIGH (ref 4.0–10.5)
nRBC: 0 % (ref 0.0–0.2)

## 2020-05-22 LAB — MAGNESIUM: Magnesium: 2.2 mg/dL (ref 1.7–2.4)

## 2020-05-22 LAB — HEMOGLOBIN AND HEMATOCRIT, BLOOD
HCT: 20.8 % — ABNORMAL LOW (ref 39.0–52.0)
Hemoglobin: 7.4 g/dL — ABNORMAL LOW (ref 13.0–17.0)

## 2020-05-22 LAB — FERRITIN: Ferritin: 343 ng/mL — ABNORMAL HIGH (ref 24–336)

## 2020-05-22 LAB — LACTIC ACID, PLASMA: Lactic Acid, Venous: 0.7 mmol/L (ref 0.5–1.9)

## 2020-05-22 LAB — CK: Total CK: 103 U/L (ref 49–397)

## 2020-05-22 LAB — HEPARIN LEVEL (UNFRACTIONATED): Heparin Unfractionated: <0.1 IU/mL — ABNORMAL LOW (ref 0.30–0.70)

## 2020-05-22 LAB — PREPARE RBC (CROSSMATCH)

## 2020-05-22 IMAGING — US IR FLUORO GUIDE CV LINE*R*
2 series · 3 of 3 positions shown · non-contrast
Comparison: none

INDICATION: 44-year-old male referred for non tunneled hemodialysis catheter

[Series 1: ir fluoro guide cv line*right* · 2 of 2 slices shown]
[im 1/2]
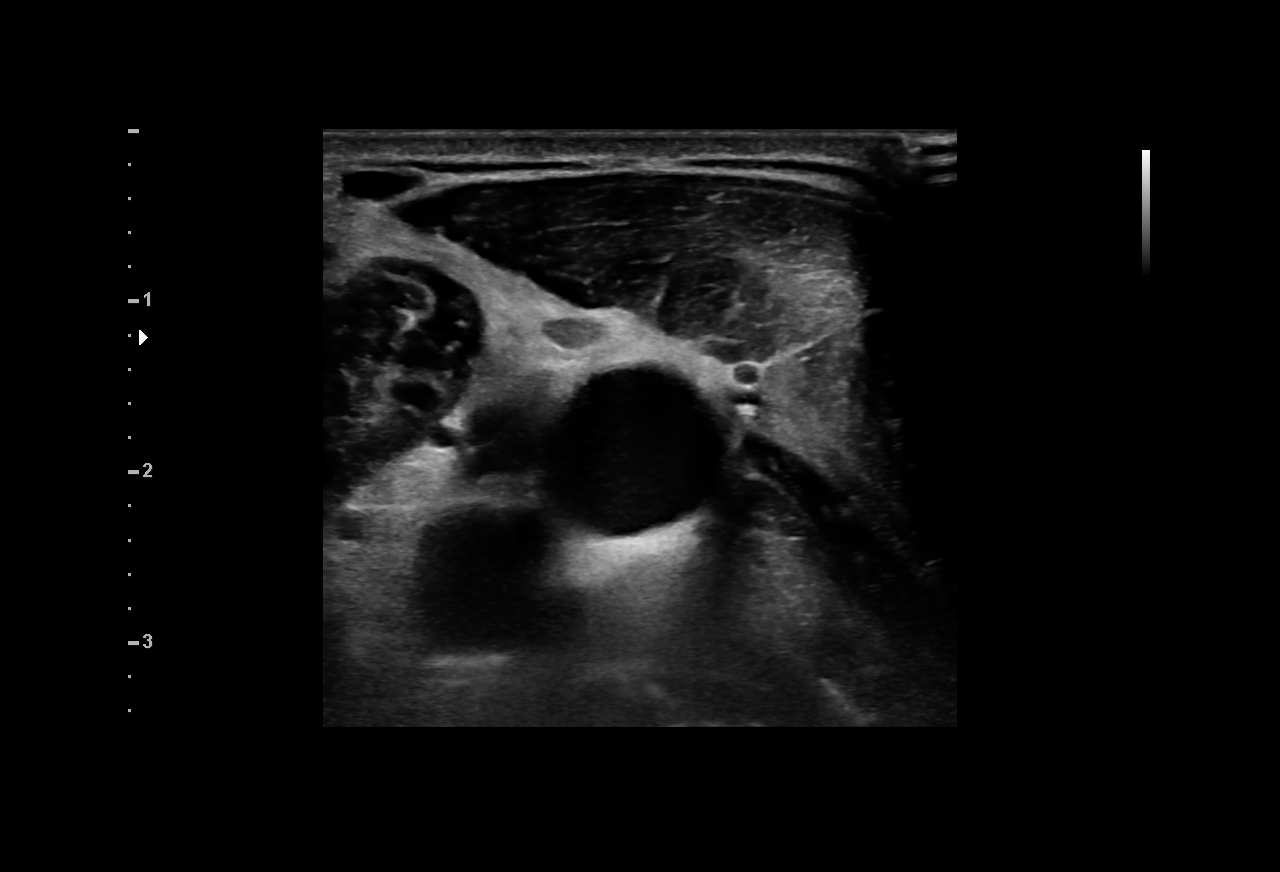
[im 2/2]
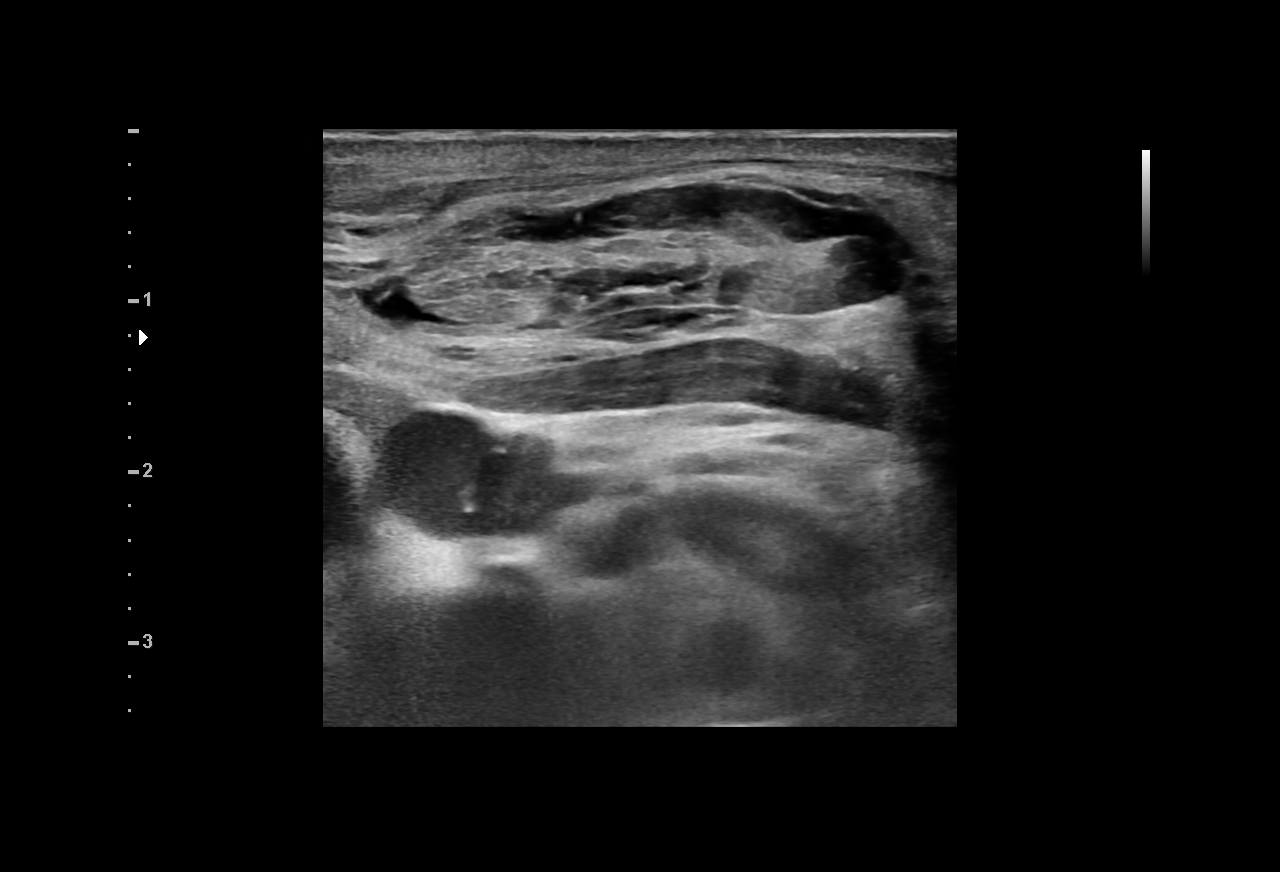

[Series 1: fl (-) angio · 1 of 1 slices shown]
[im 1/1]
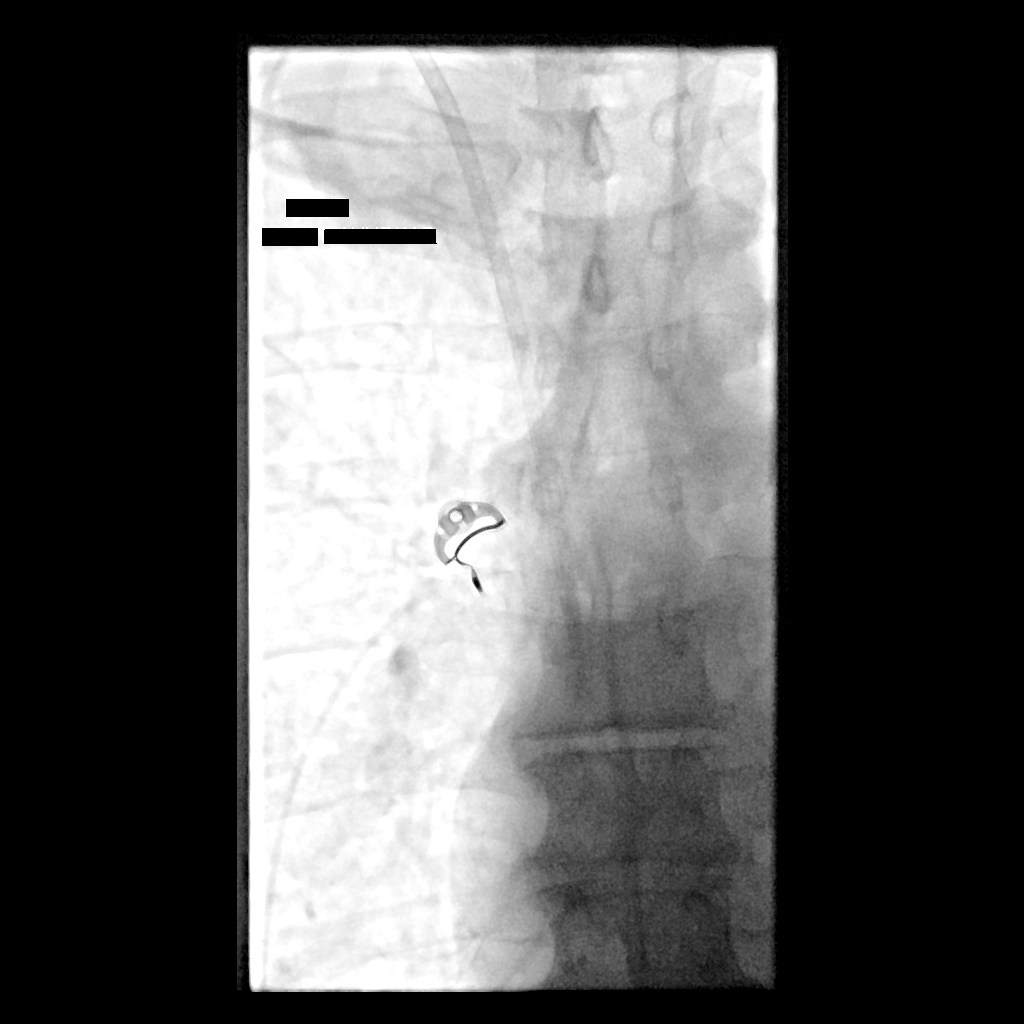

[3 of 3 positions shown; findings below may reference images not displayed]

EXAM:
CENTRAL VENOUS HEMODIALYSIS CATHETER PLACEMENT WITH ULTRASOUND AND
FLUOROSCOPIC GUIDANCE

MEDICATIONS:
NONE

ANESTHESIA/SEDATION:
None

FLUOROSCOPY TIME:  Fluoroscopy Time: 0 minutes 6 seconds (1 mGy).

COMPLICATIONS:
None

PROCEDURE:
Informed written consent was obtained from the patient's family
after a discussion of the risks, benefits, and alternatives to
treatment. Questions regarding the procedure were encouraged and
answered. The right neck was prepped with chlorhexidine in a sterile
fashion, and a sterile drape was applied covering the operative
field. Maximum barrier sterile technique with sterile gowns and
gloves were used for the procedure. A timeout was performed prior to
the initiation of the procedure.

A micropuncture kit was utilized to access the right internal
jugular vein under direct, real-time ultrasound guidance after the
overlying soft tissues were anesthetized with 1% lidocaine with
epinephrine. Ultrasound image documentation was performed. The
microwire was kinked to measure appropriate catheter length. A stiff
glidewire was advanced to the level of the IVC. A 16 cm hemodialysis
catheter was then placed over the wire.

Final catheter positioning was confirmed and documented with a spot
radiographic image. The catheter aspirates and flushes normally. The
catheter was flushed with appropriate volume heparin dwells.

Dressings were applied. The patient tolerated the procedure well
without immediate post procedural complication.
IMPRESSION: Status post image guided placement of right IJ temporary
triple-lumen hemodialysis catheter.

## 2020-05-22 MED ORDER — SODIUM CHLORIDE 0.9% IV SOLUTION: Freq: Once | INTRAVENOUS | Status: DC

## 2020-05-22 MED ORDER — SODIUM CHLORIDE 0.9 % IV SOLN
INTRAVENOUS | Status: DC | PRN
  Administered 2020-05-22: 10:00:00 1000 mL via INTRAVENOUS
  Administered 2020-05-31 – 2020-07-08 (×2): 250 mL via INTRAVENOUS

## 2020-05-22 MED ORDER — ALTEPLASE 2 MG IJ SOLR: 2.0000 mg | Freq: Once | INTRAMUSCULAR | Status: DC | PRN

## 2020-05-22 MED ORDER — ACETAMINOPHEN 325 MG PO TABS
650.0000 mg | ORAL_TABLET | Freq: Four times a day (QID) | ORAL | Status: DC | PRN
  Administered 2020-05-22 (×2): 650 mg via ORAL
  Filled 2020-05-22 (×2): qty 2

## 2020-05-22 MED ORDER — HEPARIN SODIUM (PORCINE) 1000 UNIT/ML IJ SOLN
INTRAMUSCULAR | Status: AC | PRN
  Administered 2020-05-22: 1000 [IU]

## 2020-05-22 MED ORDER — LIDOCAINE-PRILOCAINE 2.5-2.5 % EX CREA: 1.0000 "application " | TOPICAL_CREAM | CUTANEOUS | Status: DC | PRN

## 2020-05-22 MED ORDER — LIDOCAINE HCL 1 % IJ SOLN
INTRAMUSCULAR | Status: AC
  Filled 2020-05-22: qty 20

## 2020-05-22 MED ORDER — FUROSEMIDE 10 MG/ML IJ SOLN
80.0000 mg | Freq: Two times a day (BID) | INTRAMUSCULAR | Status: DC
  Administered 2020-05-22 – 2020-05-27 (×9): 80 mg via INTRAVENOUS
  Filled 2020-05-22 (×10): qty 8

## 2020-05-22 MED ORDER — DOXYCYCLINE HYCLATE 100 MG PO TABS
100.0000 mg | ORAL_TABLET | Freq: Two times a day (BID) | ORAL | Status: DC
  Administered 2020-05-22 – 2020-05-25 (×6): 100 mg via ORAL
  Filled 2020-05-22 (×6): qty 1

## 2020-05-22 MED ORDER — LIDOCAINE HCL (PF) 1 % IJ SOLN: 5.0000 mL | INTRAMUSCULAR | Status: DC | PRN

## 2020-05-22 MED ORDER — ALBUMIN HUMAN 25 % IV SOLN
25.0000 g | Freq: Once | INTRAVENOUS | Status: AC
  Administered 2020-05-22: 25 g via INTRAVENOUS
  Filled 2020-05-22: qty 100

## 2020-05-22 MED ORDER — SODIUM CHLORIDE 0.9 % IV SOLN: 100.0000 mL | INTRAVENOUS | Status: DC | PRN

## 2020-05-22 MED ORDER — CALCIUM CARBONATE ANTACID 500 MG PO CHEW
1.0000 | CHEWABLE_TABLET | Freq: Three times a day (TID) | ORAL | Status: DC
  Administered 2020-05-22 – 2020-05-25 (×11): 200 mg via ORAL
  Filled 2020-05-22 (×12): qty 1

## 2020-05-22 MED ORDER — SODIUM CHLORIDE 0.9 % IV SOLN
670.0000 mg | Freq: Every day | INTRAVENOUS | Status: DC
  Filled 2020-05-22: qty 13.4

## 2020-05-22 MED ORDER — HEPARIN SODIUM (PORCINE) 1000 UNIT/ML DIALYSIS: 1000.0000 [IU] | INTRAMUSCULAR | Status: DC | PRN

## 2020-05-22 MED ORDER — HEPARIN SODIUM (PORCINE) 1000 UNIT/ML IJ SOLN
INTRAMUSCULAR | Status: AC
  Filled 2020-05-22: qty 1

## 2020-05-22 MED ORDER — PENTAFLUOROPROP-TETRAFLUOROETH EX AERO: 1.0000 "application " | INHALATION_SPRAY | CUTANEOUS | Status: DC | PRN

## 2020-05-22 MED ORDER — SODIUM CHLORIDE 0.9 % IV SOLN
670.0000 mg | INTRAVENOUS | Status: DC
  Administered 2020-05-22 – 2020-05-25 (×2): 670 mg via INTRAVENOUS
  Filled 2020-05-22 (×2): qty 13.4

## 2020-05-22 MED ORDER — SODIUM ZIRCONIUM CYCLOSILICATE 10 G PO PACK
10.0000 g | PACK | Freq: Every day | ORAL | Status: DC
  Administered 2020-05-22 – 2020-05-23 (×2): 10 g via ORAL
  Filled 2020-05-22 (×2): qty 1

## 2020-05-22 MED ORDER — LIDOCAINE HCL 1 % IJ SOLN
INTRAMUSCULAR | Status: AC | PRN
  Administered 2020-05-22: 20 mL via INTRADERMAL

## 2020-05-22 NOTE — Progress Notes (Signed)
@   1953 Floor RN Orvan Seen made  aware patient Hemodialysis treatment will be done on tomorrow.

## 2020-05-22 NOTE — Progress Notes (Addendum)
Pharmacy Antibiotic Note  Steven Cortez is a 45 y.o. male who uses IV drugs admitted on 05/21/2020 with MRSA bacteremia. Patient previously at [facility] with MRSA on 1/18, 1/21, and 1/24 blood cultures (and sputum culture) while on vancomycin. Also developed AKI and was switched to linezolid. Patient left AMA after 10 days of admission but has been readmitted here for ongoing weakness and dyspnea. Pharmacy has been consulted for daptomycin dosing per ID as it has stronger evidence in MRSA bacteremia/endocarditis as compared to linezolid. As patient has septic emboli, will add doxycycline per ID.  Will dose daptomycin 670mg  every 48 hours (~8 mg/kg TBW) and check baseline CK with subsequent weekly CK. Will follow renal function for adjustment to once daily. Will initiate oral doxycycline 100mg  BID as well.  Plan: Discontinue linezolid Initiate daptomycin 670mg  every 48 hours Check BL and weekly CK Initiate doxycycline PO 100mg  BID Follow cultures, work up and ID recommendations   Height: 5\' 10"  (177.8 cm) Weight: 81.8 kg (180 lb 5.4 oz) IBW/kg (Calculated) : 73  Temp (24hrs), Avg:98.5 F (36.9 C), Min:98 F (36.7 C), Max:99.1 F (37.3 C)  Recent Labs  Lab 05/21/20 0831 05/21/20 0950 05/21/20 1011 05/21/20 1022 05/21/20 1850 05/22/20 0226  WBC 18.9*  --   --   --  15.9* 14.3*  CREATININE 7.23*  --  7.40* 7.50* 6.56* 6.09*  LATICACIDVEN  --  0.9  --   --   --  0.7    Estimated Creatinine Clearance: 16 mL/min (A) (by C-G formula based on SCr of 6.09 mg/dL (H)).    No Known Allergies  Antimicrobials this admission: Cefepime 1/27 x1 Linezolid 1/27>>1/28 Daptomycin 1/28>>    Microbiology results: 05/23/20 1/18 Bcx MRSA 1/21 Bcx MRSA 1/24 Bcx MRSA 1/24 Sputum MRSA  [facility] 1/27 Bcx pending   Thank you for allowing pharmacy to be a part of this patient's care.  2/18, PharmD PGY2 ID Pharmacy Resident [phone]  05/22/2020 11:42 AM

## 2020-05-22 NOTE — Progress Notes (Signed)
  Echocardiogram 2D Echocardiogram has been performed.  Steven Cortez [name] 05/22/2020, 3:22 PM

## 2020-05-22 NOTE — Procedures (Signed)
Interventional Radiology Procedure Note  Procedure: Placement of a right IJ approach triple lumen temp HD.  Tip is positioned at the superior cavoatrial junction and catheter is ready for immediate use.  Complications: None Recommendations:  - Ok to use - Do not submerge - Routine line care   Signed,  Natividad Schlosser S. Sebastion Jun, DO   

## 2020-05-22 NOTE — Progress Notes (Signed)
[facility] for Infectious Disease    Date of Admission:  05/21/2020   Total days of antibiotics 2           ID: Steven Cortez is a 45 y.o. male with   Principal Problem:   Acute renal failure ([place]) Active Problems:   Endocarditis   Urinary retention   Hyperkalemia   Microcytic anemia    Subjective: Afebrile. Feeling slightly better less shortness of breath. Less swelling to legs  Medications:  . sodium chloride   Intravenous Once  . calcium carbonate  1 tablet Oral TID  . Chlorhexidine Gluconate Cloth  6 each Topical Q0600  . doxycycline  100 mg Oral Q12H  . furosemide  80 mg Intravenous BID  . sodium zirconium cyclosilicate  10 g Oral Daily    Objective: Vital signs in last 24 hours: Temp:  [98 F (36.7 C)-99.6 F (37.6 C)] 99.6 F (37.6 C) (01/28 1558) Pulse Rate:  [81-100] 93 (01/28 1558) Resp:  [16-31] 16 (01/28 1558) BP: (148-171)/(76-97) 166/89 (01/28 1558) SpO2:  [93 %-99 %] 99 % (01/28 1558) Weight:  [81.8 kg-83.5 kg] 81.8 kg (01/28 0300) Physical Exam  Constitutional: He is oriented to person, place, and time. He appears well-developed and well-nourished. No distress.  HENT:  Mouth/Throat: Oropharynx is clear and moist. No oropharyngeal exudate.  Cardiovascular: Normal rate, regular rhythm and normal heart sounds. Exam reveals no gallop and no friction rub.  + systolic murmur Pulmonary/Chest: Effort normal and breath sounds normal. No respiratory distress. He has no wheezes.  Abdominal: Soft. Bowel sounds are normal. He exhibits no distension. There is no tenderness.  Ext: + 2 edema.  Skin: Skin is warm and dry. No rash noted. No erythema.  Psychiatric: He has a normal mood and affect. His behavior is normal.    Lab Results Recent Labs    05/21/20 1850 05/22/20 0226  WBC 15.9* 14.3*  HGB 7.3* 6.7*  HCT 21.8* 18.3*  NA 127* 129*  K 5.8* 5.7*  CL 98 99  CO2 12* 12*  BUN 184* 184*  CREATININE 6.56* 6.09*   Liver Panel Recent Labs     05/21/20 0831 05/21/20 1850 05/22/20 0226  PROT 6.1*  --  5.9*  ALBUMIN 1.7* 1.5* 1.7*  AST 30  --  39  ALT 38  --  37  ALKPHOS 297*  --  255*  BILITOT 1.1  --  1.2   Sedimentation Rate Recent Labs    05/21/20 0830  ESRSEDRATE 105*   C-Reactive Protein Recent Labs    05/21/20 0950  CRP 23.8*    Microbiology: Blood cx NGTD Studies/Results: US RENAL  Result Date: 05/21/2020 CLINICAL DATA:  AK I EXAM: RENAL / URINARY TRACT ULTRASOUND COMPLETE COMPARISON:  May 17, 2020 FINDINGS: Right Kidney: Renal measurements: 12.5 x 5.0 x 5.6 cm = volume: 182 mL. Diffusely increased renal echogenicity. There is mild RIGHT hydronephrosis, similar in comparison to prior. Left Kidney: Renal measurements: 13.1 x 5.5 x 5.9 cm = volume: 222 mL. Renal echogenicity is diffusely increased. There is minimal LEFT hydronephrosis. Bladder: Distended Other: Small volume ascites. Splenomegaly the spleen measuring at least 14 x 13.5 x 8 cm for a volume of 792 ML. Small LEFT pleural effusion. IMPRESSION: 1. There is mild RIGHT greater than LEFT hydronephrosis. The bladder is distended. Recommend correlation for outlet obstruction. 2. Splenomegaly. 3. Trace ascites and small LEFT pleural effusion. 4. Diffusely increased renal echogenicity as can be seen in medical renal disease. Electronically  Signed   By: Valentino Saxon MD   On: 05/21/2020 13:52   IR Fluoro Guide CV Line Right  Result Date: 05/22/2020 INDICATION: 45 year old male referred for non tunneled hemodialysis catheter EXAM: CENTRAL VENOUS HEMODIALYSIS CATHETER PLACEMENT WITH ULTRASOUND AND FLUOROSCOPIC GUIDANCE MEDICATIONS: NONE ANESTHESIA/SEDATION: None FLUOROSCOPY TIME:  Fluoroscopy Time: 0 minutes 6 seconds (1 mGy). COMPLICATIONS: None PROCEDURE: Informed written consent was obtained from the patient's family after a discussion of the risks, benefits, and alternatives to treatment. Questions regarding the procedure were encouraged and answered. The  right neck was prepped with chlorhexidine in a sterile fashion, and a sterile drape was applied covering the operative field. Maximum barrier sterile technique with sterile gowns and gloves were used for the procedure. A timeout was performed prior to the initiation of the procedure. A micropuncture kit was utilized to access the right internal jugular vein under direct, real-time ultrasound guidance after the overlying soft tissues were anesthetized with 1% lidocaine with epinephrine. Ultrasound image documentation was performed. The microwire was kinked to measure appropriate catheter length. A stiff glidewire was advanced to the level of the IVC. A 16 cm hemodialysis catheter was then placed over the wire. Final catheter positioning was confirmed and documented with a spot radiographic image. The catheter aspirates and flushes normally. The catheter was flushed with appropriate volume heparin dwells. Dressings were applied. The patient tolerated the procedure well without immediate post procedural complication. IMPRESSION: Status post image guided placement of right IJ temporary triple-lumen hemodialysis catheter. Signed, Dulcy Fanny. Dellia Nims, RPVI [facility] Radiology Electronically Signed   By: Corrie Mckusick D.O.   On: 05/22/2020 14:12   DG Chest Port 1 View  Result Date: 05/21/2020 CLINICAL DATA:  Chest pain and shortness of breath EXAM: PORTABLE CHEST 1 VIEW COMPARISON:  May 15, 2020 chest radiograph and chest CT May 18, 2020 FINDINGS: There are again noted multiple nodular lesions throughout the lungs bilaterally, several of which are cavitated. There is a small right pleural effusion. There is ill-defined airspace opacity in the right base which appears increased from recent studies. Heart size and pulmonary vascular normal. No adenopathy. Degenerative changes noted in the left shoulder. IMPRESSION: Multiple ill-defined nodular opacities of  varying sizes, several of which show cavitation, likely representing multifocal septic emboli. These nodular opacities overall appear similar to findings on most recent CT. There is a small right pleural effusion with slight increase in airspace opacity in the right base, likely focus of pneumonia. Stable cardiac silhouette. Electronically Signed   By: Lowella Grip III M.D.   On: 05/21/2020 09:02   ECHOCARDIOGRAM COMPLETE  Result Date: 05/22/2020    ECHOCARDIOGRAM REPORT   Patient Name:   Steven Cortez Date of Exam: 05/22/2020 Medical Rec #:  [id number]      Height:       70.0 in Accession #:    [id number]     Weight:       180.3 lb Date of Birth:  07-03-1975      BSA:          1.998 m Patient Age:    71 years       BP:           148/80 mmHg Patient Gender: M              HR:           90 bpm. Exam Location:  Inpatient Procedure: 2D Echo, Cardiac Doppler and Color Doppler  Reported  to: Dr. Debara Pickett on 05/22/2020 3:20:00 AM      Mass visualized on Tricuspid valve. Indications:    Endocarditis. I38  History:        Patient has no prior history of Echocardiogram examinations.  Sonographer:    Jonelle Sidle Dance Referring Phys: [id number] Raynaldo Opitz RAINES IMPRESSIONS  1. Left ventricular ejection fraction, by estimation, is 60 to 65%. The left ventricle has normal function. The left ventricle has no regional wall motion abnormalities. There is mild left ventricular hypertrophy. Left ventricular diastolic parameters were normal.  2. Right ventricular systolic function is normal. The right ventricular size is normal.  3. The mitral valve is normal in structure. No evidence of mitral valve regurgitation.  4. Large mobile echogenic mass noted on the RV side of the tricuspid valve septal leaflet, measuring 1 x 3 cm, suggestive of vegetation. The tricuspid valve is abnormal.  5. The aortic valve is tricuspid. Aortic valve regurgitation is not visualized.  6. The inferior vena cava is normal in size with greater than 50%  respiratory variability, suggesting right atrial pressure of 3 mmHg. Comparison(s): No prior Echocardiogram. Conclusion(s)/Recommendation(s): Findings demonstrate tricuspid valve mass likley vegetation, would recommend a Transesophageal Echocardiogram for further evaluation. Critical findings reported to Dr. Rebeca Alert and acknowledged at 05/22/2020 at 3:56 pm. FINDINGS  Left Ventricle: Left ventricular ejection fraction, by estimation, is 60 to 65%. The left ventricle has normal function. The left ventricle has no regional wall motion abnormalities. The left ventricular internal cavity size was normal in size. There is  mild left ventricular hypertrophy. Left ventricular diastolic parameters were normal. Right Ventricle: The right ventricular size is normal. No increase in right ventricular wall thickness. Right ventricular systolic function is normal. Left Atrium: Left atrial size was normal in size. Right Atrium: Right atrial size was normal in size. Pericardium: There is no evidence of pericardial effusion. Mitral Valve: The mitral valve is normal in structure. No evidence of mitral valve regurgitation. Tricuspid Valve: Large mobile echogenic mass noted on the RV side of the tricuspid valve septal leaflet, measuring 1 x 3 cm, suggestive of vegetation. The tricuspid valve is abnormal. Tricuspid valve regurgitation is mild. Aortic Valve: The aortic valve is tricuspid. Aortic valve regurgitation is not visualized. Pulmonic Valve: The pulmonic valve was normal in structure. Pulmonic valve regurgitation is not visualized. Aorta: The aortic root and ascending aorta are structurally normal, with no evidence of dilitation. Venous: The inferior vena cava is normal in size with greater than 50% respiratory variability, suggesting right atrial pressure of 3 mmHg. IAS/Shunts: No atrial level shunt detected by color flow Doppler.  LEFT VENTRICLE PLAX 2D LVIDd:         4.90 cm  Diastology LVIDs:         3.70 cm  LV e' medial:     12.20 cm/s LV PW:         1.30 cm  LV E/e' medial:  8.7 LV IVS:        0.80 cm  LV e' lateral:   14.30 cm/s LVOT diam:     2.00 cm  LV E/e' lateral: 7.4 LV SV:         96 LV SV Index:   48 LVOT Area:     3.14 cm  RIGHT VENTRICLE             IVC RV Basal diam:  2.30 cm     IVC diam: 2.00 cm RV S prime:     17.70 cm/s TAPSE (  M-mode): 2.8 cm LEFT ATRIUM             Index       RIGHT ATRIUM           Index LA diam:        4.20 cm 2.10 cm/m  RA Area:     16.50 cm LA Vol (A2C):   71.2 ml 35.64 ml/m RA Volume:   43.30 ml  21.68 ml/m LA Vol (A4C):   61.3 ml 30.69 ml/m LA Biplane Vol: 67.1 ml 33.59 ml/m  AORTIC VALVE LVOT Vmax:   168.00 cm/s LVOT Vmean:  116.000 cm/s LVOT VTI:    0.306 m  AORTA Ao Root diam: 3.40 cm Ao Asc diam:  3.20 cm MITRAL VALVE MV Area (PHT): 2.91 cm     SHUNTS MV Decel Time: 261 msec     Systemic VTI:  0.31 m MV E velocity: 106.00 cm/s  Systemic Diam: 2.00 cm MV A velocity: 82.00 cm/s MV E/A ratio:  1.29 Lyman Bishop MD Electronically signed by Lyman Bishop MD Signature Date/Time: 05/22/2020/3:59:45 PM    Final      Assessment/Plan: MRSA bacteremia disseminated infection - with TV endocarditis with pulmonary septic emboli = will change to daptomycin 53m/kg QOD plus IV doxycycline (for coverage of pulmonary involvement).  Agree with plan to image spine to see if he has epidural abscess  Please reach out to dr lightfoot to see if candidate for angiovac  Difficulty with urination = concern that it is multifactorial , agree with plans for imaging  LE edema = appears improving with auto-diuresis. Continue to monitor to see if later needs diuresis  Acute on chronic ckd = baseline cr is in 2-3 [place] T[place]udencio Pairto see over the weekend.  [facility] Cell: [phone]: [phone]  05/22/2020, 4:21 PM

## 2020-05-22 NOTE — Progress Notes (Addendum)
Subjective: Intermittent events: Patient with significant UOP over the course of yesterday post foley placement.   Patient reports still having generalized weakness and back pain. Weakness is improving. Denies neck pain.    Objective:  Vital signs in last 24 hours: Vitals:   05/22/20 0300 05/22/20 0500 05/22/20 0821 05/22/20 1116  BP:  (!) 168/86 (!) 161/76 (!) 148/80  Pulse:  88 81 83  Resp:  (!) 22 20 20   Temp:  98.6 F (37 C) 98.7 F (37.1 C) 98 F (36.7 C)  TempSrc:  Oral Oral Oral  SpO2:  98% 98% 99%  Weight: 81.8 kg     Height:       Weight change:   Intake/Output Summary (Last 24 hours) at 05/22/2020 1419 Last data filed at 05/22/2020 1305 Gross per 24 hour  Intake 460 ml  Output 2730 ml  Net -2270 ml   Physical Exam Constitutional:      General: He is not in acute distress.    Appearance: He is ill-appearing.     Comments: Tired appearing male seen lying in bed, NAD  Cardiovascular:     Rate and Rhythm: Normal rate and regular rhythm.     Heart sounds: Murmur heard.   Systolic murmur is present with a grade of 2/6.   Pulmonary:     Effort: Pulmonary effort is normal.     Comments: Diffuse bilateral crackles Abdominal:     Palpations: Abdomen is soft.  Musculoskeletal:     Right lower leg: Pitting Edema present.     Left lower leg: Pitting Edema present.     Comments: 1-2+ pitting edema  Skin:    Coloration: Jaundice: mildly jaundiced.  Neurological:     Mental Status: He is alert.      Assessment/Plan:  Principal Problem:   Acute renal failure (H[facility]) Active Problems:   Endocarditis   Urinary retention   Hyperkalemia   Microcytic anemia  45 year old male with history of IV drug use and untreated Hep C presenting with weakness, dyspnea, pleuritic chest pain, urinary retention, and pitting edema. He was recently admitted to [facility] and found to have tricuspid endocarditis with MRSA bacteremia, and pulmonary septic emboli.  He was  initially being treated with vancomycin, then switched to linezolid in light of persistently positive blood cultures. He left [facility] AMA a few days ago, and is now admitted as a result of worsening symptoms.     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  Repeat TTE here shows 1 x 3 cm vegetation on the septal leaflet of the tricuspid valve, blood cultures here no growth to date.  Fortunately, LVEF is preserved and TR is only mild at this time.  However, given the size of the vegetation, will consult CT surgery for evaluation.  In addition, with his combination of back pain, leg weakness, and urinary retention, concern for epidural abscess, obtain MRI of the T/L-spine, noncontrast given his acute renal failure.    -Appreciate ID recs:   -Switch from Linezolid to q 2 days Daptomycin and BID doxycycline tomorrow  -Consulted CT surg, f/u recs -f/u blood cultures from admission (no growth thus far)  -Thoracolumbar MRI ordered, f/u results -Will consider additional imaging to evaluate for other embolic locations as indicated   Acute renal failure  hyperkalemia  elevated AGMA  Hyponatremia Patient presented with  two days of anuria. On admission, creatinine of 7.23, BUN 184, K+ 6.3; Anion gap metabolic acidosis. Pyuria and hematuria present on urinalysis. Setting of possible bacteremia; and known pulmonary septic emboli. Likely multifactorial, possibly some contribution of acute obstruction. Renal u/s significant for bladder distension and mild bilateral hydronephrosis. Foley placed with difficulty, phimosis noted by urology. Patient has had good UOP following foley catheter placement, with some improvement in renal functioning. Cr trending down at 6.09. Potassium additionally downtrending with auto diuresis and Lokelma. Patient s/p  placement of temporary dialysis catheter.   -Appreciate nephrology recommendations, planning for HD tomorrow -Keep Foley in place at least 7 days, Foley care as per urology recommendations  -f/u evening BMP -f/u hep c, c3,c4   Hypervolemia Likely due to combination of acute renal failure and possible new onset CHF. He does have visible JVP, but this could be due to tricuspid regurgitation, on POC [place] LV function appears close to normal and IVC only moderately dilated with respiratory variations consistent with mildly increased CVP   -TTE reassuring with preserved LVEF, normal CVP estimation by IVC -Continue Monitoring UOP -Consider diuresis pending improved kidney function -No hypertensives at this time out of consideration of renal perfusion   Microcytic anemia likely 2/2 Chronic Disease Acute Anemia Patient with hx of IV drug use, presenting with tricuspid endocarditis and pulmonary septic emboli. Notable microcytic anemia. Hypo-proliferative per corrected retic count. Elevated ferritin, low iron and TIBC. Drop in hemoglobin to 6.7 overnight.  Etiology of anemia likely multifactorial with prime component of anemia of chronic inflammation.  -receiving unit of pRBC, f/u post transfusion H&H   Hep C Patient with hx of IV drug use. Reporting hx of untreated Hep C.  -HCV PCR pending     LOS: 1 day   Steven Cortez, Medical Student 05/22/2020, 2:19 PM

## 2020-05-22 NOTE — Progress Notes (Signed)
  Echocardiogram 2D Echocardiogram has been attempted. Patient receiving personal care. Will reattempt at later time.  Marrell Dicaprio G Michiah Masse 05/22/2020, 10:22 AM

## 2020-05-22 NOTE — Progress Notes (Signed)
[facility] Progress Note   Subjective:   Seen at bedside. S/p temp HD catheter.  No new issues.  No further hemoptysis.  No rashes. Primary team has records from [facility]:  CT showed no abscess on the kidney and no hydronephrosis at that time, though a later ultrasound showed R>L hydronephrosis. His creatinine was 2.5 on admission and increased to 4.9 when he left [facility]    Objective Vitals:   05/21/20 2207 05/22/20 0049 05/22/20 0300 05/22/20 0500  BP: (!) 166/91 (!) 166/87  (!) 168/86  Pulse: 94 93  88  Resp: 20 (!) 22  (!) 22  Temp: 99.1 F (37.3 C) 98.6 F (37 C)  98.6 F (37 C)  TempSrc: Oral Oral  Oral  SpO2: 98% 98%  98%  Weight:   81.8 kg   Height:       Physical Exam General: uncomfortable by nontoxic Heart: hyperdynamic, no rub Lungs: clear ant, basilar rales R > L Abdomen: soft, nontender Extremities: 2+ edema to knees Skin: no janeway lesions, splinter hemorrhages; no petechiae; R calf with traumatic scab with some surrounding erythema that doesn't look infected Dialysis Access:  RIJ temp HD catheter  Additional Objective Labs: Basic Metabolic Panel: Recent Labs  Lab 05/21/20 0831 05/21/20 1011 05/21/20 1022 05/21/20 1850 05/22/20 0226  NA 130*   < > 128* 127* 129*  K 6.3*   < > 6.1* 5.8* 5.7*  CL 99   < > 102 98 99  CO2 11*  --   --  12* 12*  GLUCOSE 98   < > 98 147* 115*  BUN 184*   < > >130* 184* 184*  CREATININE 7.23*   < > 7.50* 6.56* 6.09*  CALCIUM 6.9*  --   --  6.7* 6.5*  PHOS  --   --   --  >30.0*  --    < > = values in this interval not displayed.   Liver Function Tests: Recent Labs  Lab 05/21/20 0831 05/21/20 1850 05/22/20 0226  AST 30  --  39  ALT 38  --  37  ALKPHOS 297*  --  255*  BILITOT 1.1  --  1.2  PROT 6.1*  --  5.9*  ALBUMIN 1.7* 1.5* 1.7*   No results for input(s): LIPASE, AMYLASE in the last 168 hours. CBC: Recent Labs  Lab 05/21/20 0831 05/21/20 1011 05/21/20 1022 05/21/20 1850 05/22/20 0226  WBC  18.9*  --   --  15.9* 14.3*  NEUTROABS  --   --   --  14.0*  --   HGB 8.1*   < > 7.1* 7.3* 6.7*  HCT 23.7*   < > 21.0* 21.8* 18.3*  MCV 79.0*  --   --  78.4* 75.3*  PLT 170  --   --  154 160   < > = values in this interval not displayed.   Blood Culture No results found for: SDES, SPECREQUEST, CULT, REPTSTATUS  Cardiac Enzymes: No results for input(s): CKTOTAL, CKMB, CKMBINDEX, TROPONINI in the last 168 hours. CBG: No results for input(s): GLUCAP in the last 168 hours. Iron Studies:  Recent Labs    05/21/20 1850 05/22/20 0226  IRON 40*  --   TIBC 189*  --   FERRITIN  --  343*   @[email or web address] @ Studies/Results: RENAL  Result Date: 05/21/2020 CLINICAL DATA:  AK I EXAM: RENAL / URINARY TRACT ULTRASOUND COMPLETE COMPARISON:  May 17, 2020 FINDINGS: Right Kidney: Renal measurements: 12.5 x 5.0 x  5.6 cm = volume: 182 mL. Diffusely increased renal echogenicity. There is mild RIGHT hydronephrosis, similar in comparison to prior. Left Kidney: Renal measurements: 13.1 x 5.5 x 5.9 cm = volume: 222 mL. Renal echogenicity is diffusely increased. There is minimal LEFT hydronephrosis. Bladder: Distended Other: Small volume ascites. Splenomegaly the spleen measuring at least 14 x 13.5 x 8 cm for a volume of 792 ML. Small LEFT pleural effusion. IMPRESSION: 1. There is mild RIGHT greater than LEFT hydronephrosis. The bladder is distended. Recommend correlation for outlet obstruction. 2. Splenomegaly. 3. Trace ascites and small LEFT pleural effusion. 4. Diffusely increased renal echogenicity as can be seen in medical renal disease. Electronically Signed   By: Meda Klinefelter MD   On: 05/21/2020 13:52   DG Chest Port 1 View  Result Date: 05/21/2020 CLINICAL DATA:  Chest pain and shortness of breath EXAM: PORTABLE CHEST 1 VIEW COMPARISON:  May 15, 2020 chest radiograph and chest CT May 18, 2020 FINDINGS: There are again noted multiple nodular lesions throughout the lungs bilaterally,  several of which are cavitated. There is a small right pleural effusion. There is ill-defined airspace opacity in the right base which appears increased from recent studies. Heart size and pulmonary vascular normal. No adenopathy. Degenerative changes noted in the left shoulder. IMPRESSION: Multiple ill-defined nodular opacities of varying sizes, several of which show cavitation, likely representing multifocal septic emboli. These nodular opacities overall appear similar to findings on most recent CT. There is a small right pleural effusion with slight increase in airspace opacity in the right base, likely focus of pneumonia. Stable cardiac silhouette. Electronically Signed   By: Bretta Bang III M.D.   On: 05/21/2020 09:02   Medications: . linezolid (ZYVOX) IV 600 mg (05/22/20 0049)   . sodium chloride   Intravenous Once  . Chlorhexidine Gluconate Cloth  6 each Topical Q0600    Assessment/Recommendations: Steven Cortez is a/an 45 y.o. male with a past medical history Hep C and IVDU who present w/ likely endocarditis and ARF with electrolyte abnormalities.  He was recently admitted to [facility] but left AMA and presented 1/27 to [facility].   Severe AKI:  w/ multiple electrolyte abnormalities and mild uremia; nonoliguric. Proceed with dialysis. Differential includes ATN, renal infarction from septic emboli, infection associated GN (bacterial and hepc).  UA with WBC/RBC/protein; UP/C 0.94.  CT RH ok from admission.  Renal US showing mild hydro and distended bladder -IR placed temp cath;  Had planned HD today but in light of dialysis census will need to move to tomorrow -pending c3, c4 -In the setting of endocarditis I'm not inclined to proceed to renal biopsy at this time but will consider if no improvement and we would be able to immunosuppress him in the future -q8h bladder scan and I/o cath if retaining -Continue to monitor daily Cr, Dose meds for GFR -Monitor Daily I/Os, Daily weight  -Maintain MAP>65  for optimal renal perfusion.  -Avoid nephrotoxic medications including NSAID  Hyperkalemia: 2/2 AKI -Daily lokelma 10 until managed -Renal diet -Dialysis tomorrow  Hypocalcemia: 2/2 repletion - Corrects to 8s - Ca carbonate 500 TID for now  Hep C: not treated -Obtain PCR to determine viral load -Consider ID consult   Hyponatremia: 2/2 aki. Will improve with dialysis  Endocarditis: likely based on history, imaging, and exam -Abx per primary team and ID -Obtain outside records -F/u cultures  PE: abnormal VQ at [facility], had some hemoptysis after anticoagulated and developed thrombocytopenia outpt. -heparin gtt currently  Hypertension:  related to volume excess, manage with dialysis  Anemia due to inflammation: transfuse as needed; Hb 6.7 this AM - will defer transfusion orders to primary team. Would give as indicated and not wait for dialysis to give as timing indeterminant.  Estill Bakes MD 05/22/2020, 7:29 AM  [facility] Pager: [phone]

## 2020-05-23 ENCOUNTER — Inpatient Hospital Stay (HOSPITAL_COMMUNITY): Payer: Medicaid Other | Admitting: Certified Registered Nurse Anesthetist

## 2020-05-23 ENCOUNTER — Encounter (HOSPITAL_COMMUNITY)
Admission: EM | Disposition: A | Discharge status: Home / Self Care | Payer: Self-pay | Source: Home / Self Care | Attending: Internal Medicine

## 2020-05-23 ENCOUNTER — Inpatient Hospital Stay (HOSPITAL_COMMUNITY): Payer: Medicaid Other

## 2020-05-23 ENCOUNTER — Encounter (HOSPITAL_COMMUNITY): Payer: Self-pay | Admitting: Internal Medicine

## 2020-05-23 DIAGNOSIS — D638 Anemia in other chronic diseases classified elsewhere: Secondary | ICD-10-CM

## 2020-05-23 HISTORY — PX: LUMBAR LAMINECTOMY/ DECOMPRESSION WITH MET-RX: SHX5959

## 2020-05-23 LAB — BPAM RBC
Blood Product Expiration Date: 202202262359
ISSUE DATE / TIME: 202201281558
Unit Type and Rh: 5100

## 2020-05-23 LAB — CBC
HCT: 21.6 % — ABNORMAL LOW (ref 39.0–52.0)
Hemoglobin: 7.5 g/dL — ABNORMAL LOW (ref 13.0–17.0)
MCH: 27.4 pg (ref 26.0–34.0)
MCHC: 34.7 g/dL (ref 30.0–36.0)
MCV: 78.8 fL — ABNORMAL LOW (ref 80.0–100.0)
Platelets: 188 10*3/uL (ref 150–400)
RBC: 2.74 MIL/uL — ABNORMAL LOW (ref 4.22–5.81)
RDW: 15.7 % — ABNORMAL HIGH (ref 11.5–15.5)
WBC: 18.1 10*3/uL — ABNORMAL HIGH (ref 4.0–10.5)
nRBC: 0 % (ref 0.0–0.2)

## 2020-05-23 LAB — C3 COMPLEMENT: C3 Complement: 60 mg/dL — ABNORMAL LOW (ref 82–167)

## 2020-05-23 LAB — TYPE AND SCREEN
ABO/RH(D): O POS
Antibody Screen: NEGATIVE
Unit division: 0

## 2020-05-23 LAB — BASIC METABOLIC PANEL
Anion gap: 15 (ref 5–15)
BUN: 165 mg/dL — ABNORMAL HIGH (ref 6–20)
CO2: 15 mmol/L — ABNORMAL LOW (ref 22–32)
Calcium: 6.9 mg/dL — ABNORMAL LOW (ref 8.9–10.3)
Chloride: 104 mmol/L (ref 98–111)
Creatinine, Ser: 4.11 mg/dL — ABNORMAL HIGH (ref 0.61–1.24)
GFR, Estimated: 17 mL/min — ABNORMAL LOW (ref >60)
Glucose, Bld: 92 mg/dL (ref 70–99)
Potassium: 4.9 mmol/L (ref 3.5–5.1)
Sodium: 134 mmol/L — ABNORMAL LOW (ref 135–145)

## 2020-05-23 LAB — SURGICAL PCR SCREEN
MRSA, PCR: POSITIVE — AB
Staphylococcus aureus: POSITIVE — AB

## 2020-05-23 LAB — PHOSPHORUS: Phosphorus: 11.9 mg/dL — ABNORMAL HIGH (ref 2.5–4.6)

## 2020-05-23 LAB — C4 COMPLEMENT: Complement C4, Body Fluid: 19 mg/dL (ref 12–38)

## 2020-05-23 IMAGING — MR MR THORACIC SPINE W/O CM
6 of 9 series · 22 of 48 positions shown · non-contrast
Comparison: Abdomen and pelvis CT [DATE]

CLINICAL DATA: Endocarditis with urinary retention and back pain.
Evaluate for epidural abscess. On hemodialysis.

EXAM:
MRI THORACIC AND LUMBAR SPINE WITHOUT CONTRAST
TECHNIQUE: Multiplanar and multiecho pulse sequences of the thoracic and lumbar
spine were obtained without intravenous contrast.

[Series 16: T1 · sagittal · 3.0mm · 0.62mm/px · 1 of 9 slices shown (1 of 4)]
[im 1/9]
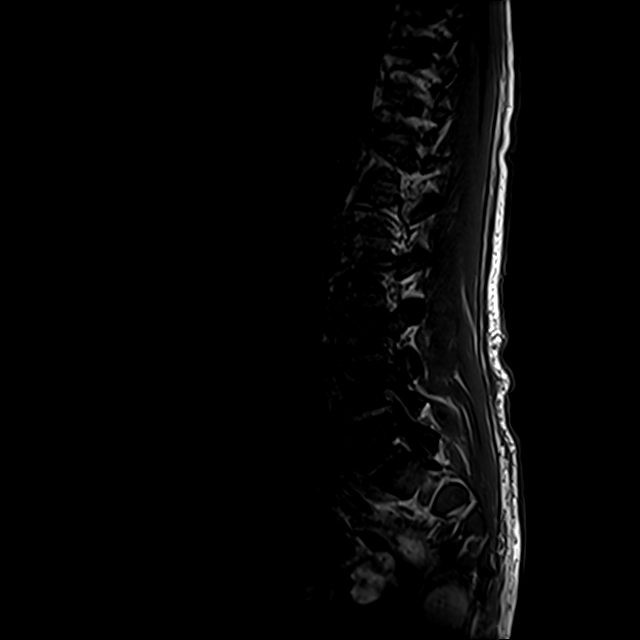

[Series 17: T1 · sagittal · 3.0mm · 0.62mm/px · 2 of 9 slices shown (2 of 4)]
[im 1/9]
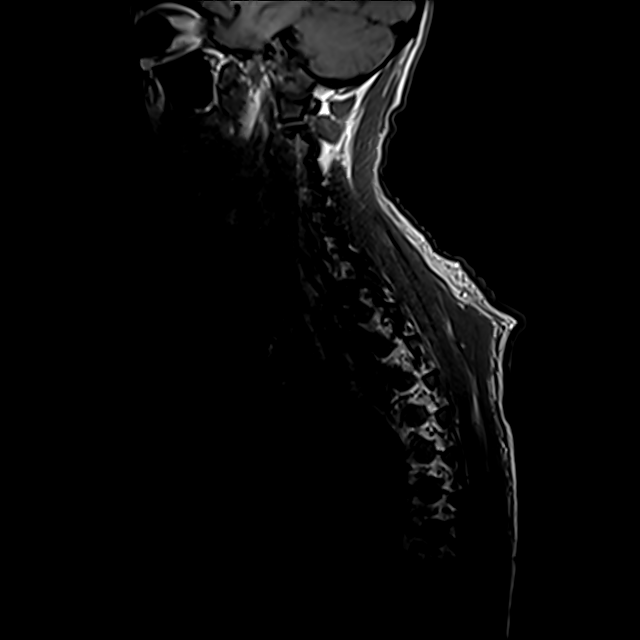
[im 9/9]
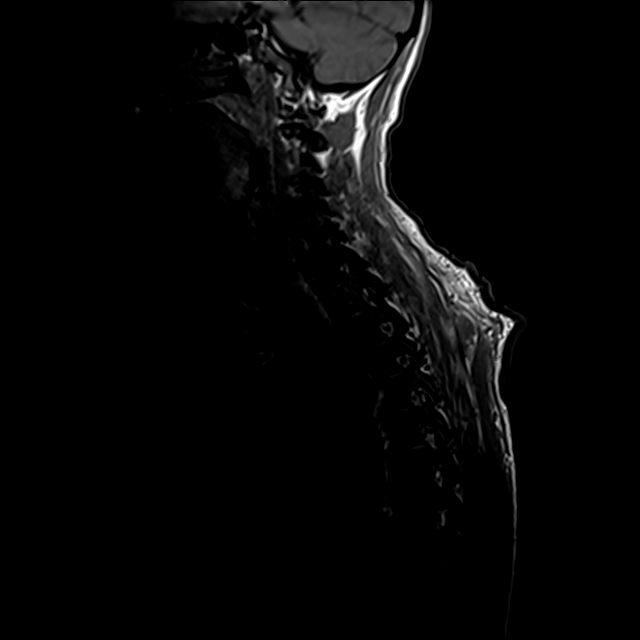

[Series 18: T1 · sagittal · 3.3mm · 0.62mm/px · 2 of 9 slices shown (3 of 4)]
[im 1/9]
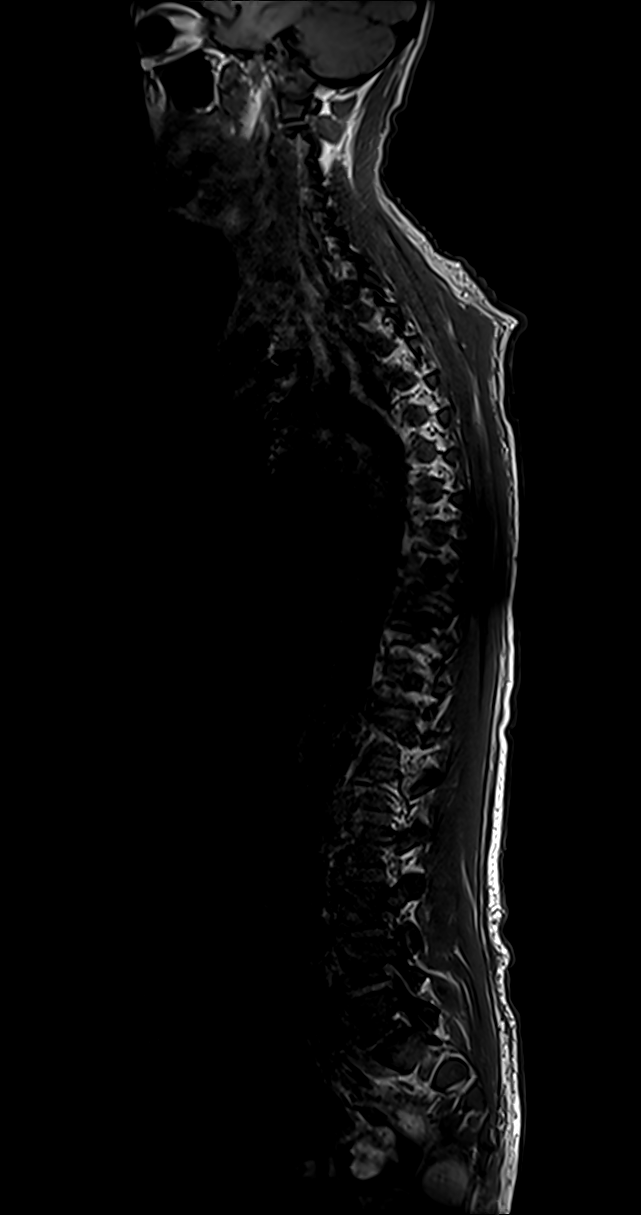
[im 9/9]
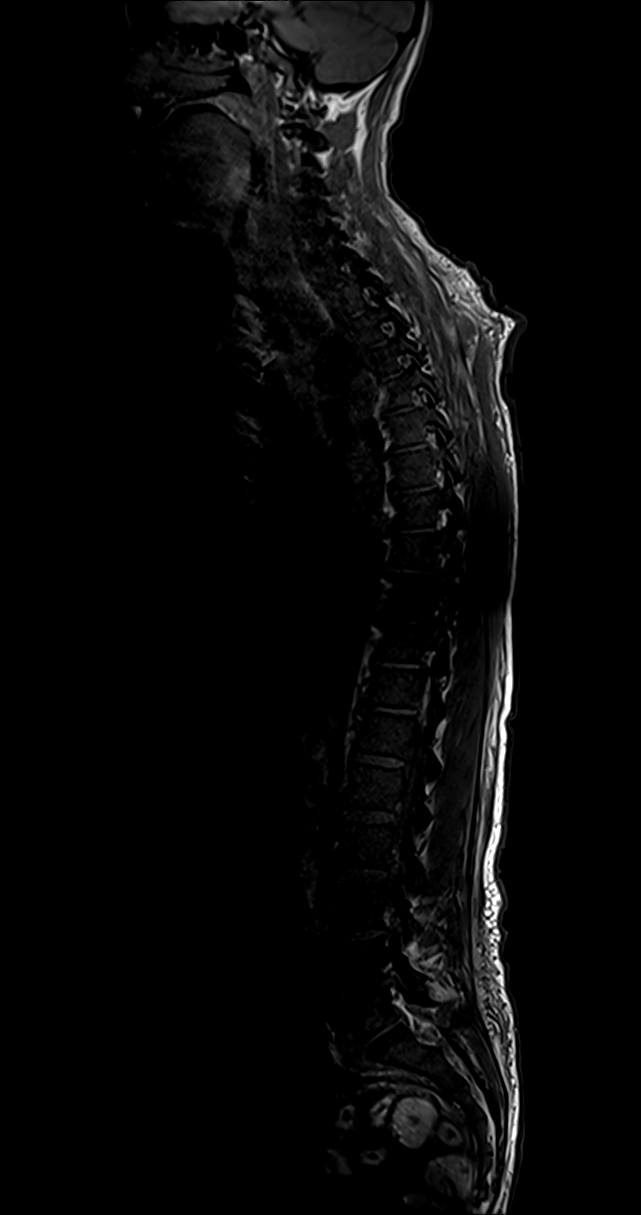

[Series 19: T2 · sagittal · 3.0mm · 0.76mm/px · 4 of 17 slices shown (1 of 2)]
[im 1/17]
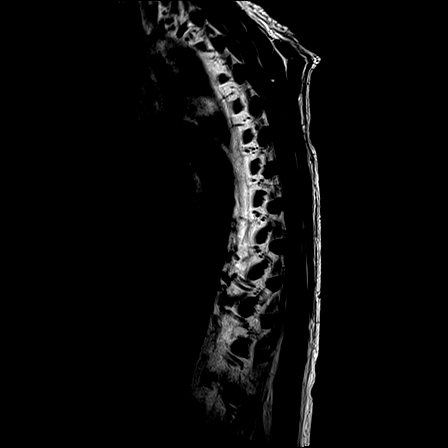
[im 6/17]
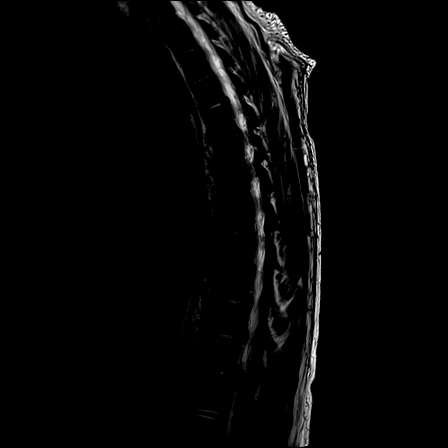
[im 11/17]
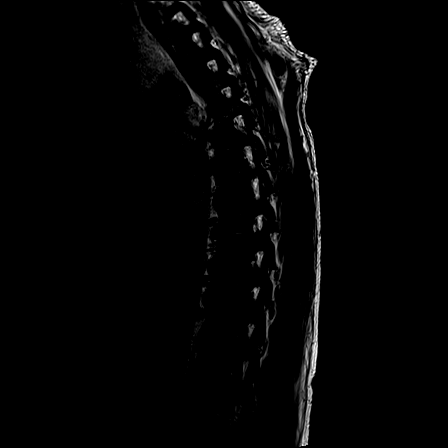
[im 17/17]
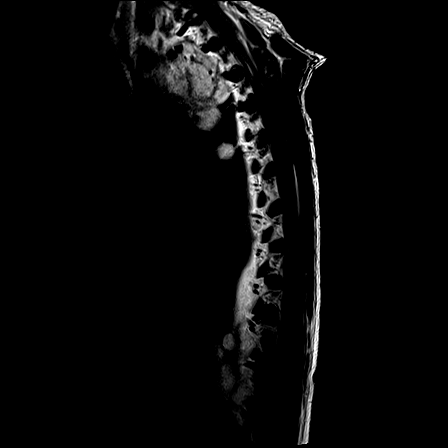

[Series 20: T1 · sagittal · 3.0mm · 0.76mm/px · 4 of 17 slices shown (4 of 4)]
[im 1/17]
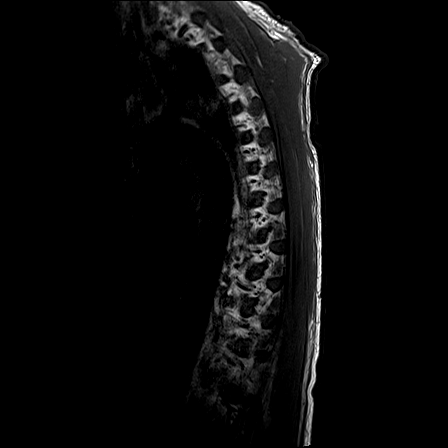
[im 6/17]
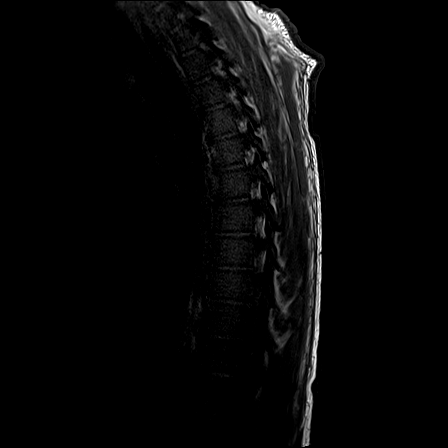
[im 11/17]
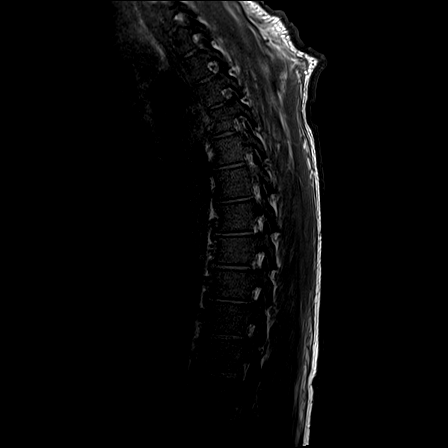
[im 17/17]
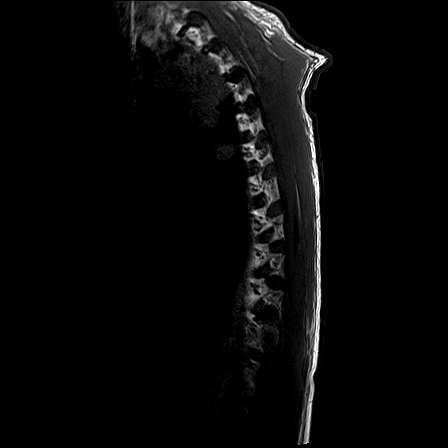

[Series 22: T2 · axial · 5.0mm · 0.59mm/px · z∈[-172,+105]mm · 9 of 39 slices shown (2 of 2)]
[im 1/39]
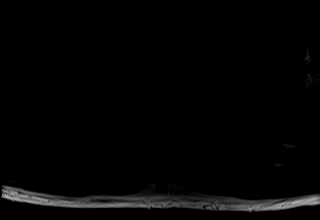
[im 5/39]
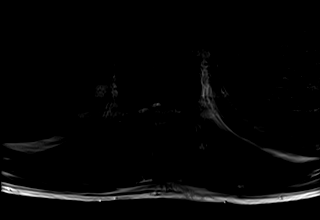
[im 10/39]
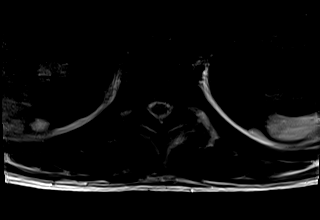
[im 15/39]
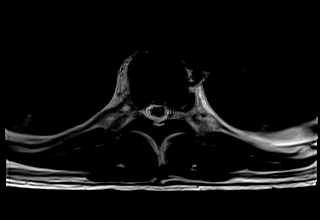
[im 20/39]
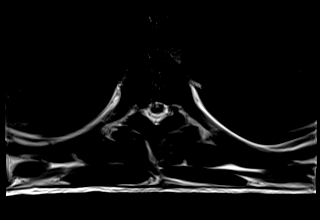
[im 24/39]
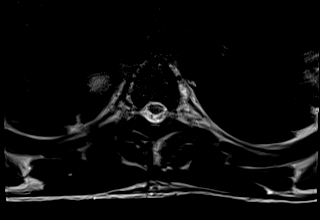
[im 29/39]
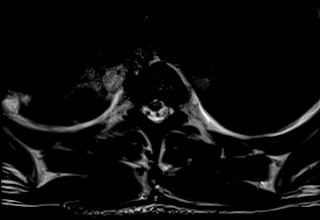
[im 34/39]
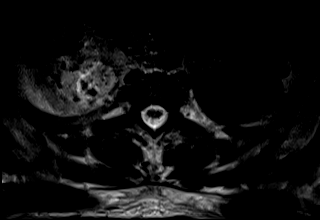
[im 39/39]
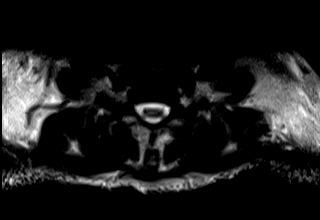

[22 of 48 positions shown; findings below may reference images not displayed]

FINDINGS: MRI THORACIC SPINE FINDINGS

Alignment:  Physiologic

Vertebrae: No fracture, evidence of discitis, or bone lesion.

Cord: Normal signal and morphology. Dorsal fluid collection
beginning at the level of T11-12 and contining into the lumbar
spine, as described below.

Paraspinal and other soft tissues: Cavitary/septic pneumonia
bilaterally with small pleural effusions the overall pattern is
similar to prior chest CT.

Disc levels:

Disc narrowing and small protrusions at T7-8 to T10-11. No
degenerative neural compression.

MRI LUMBAR SPINE FINDINGS

Segmentation: Five lumbar type vertebrae. Localization between the
thoracic and lumbar MRIs is misregistered but 12 thoracic vertebrae
and 5 lumbar type vertebrae are confirmed on chest and abdomen CT
comparison.

Alignment:  Physiologic

Vertebrae: Marrow edema on both sides of the L4-5 and L5-S1 disc
spaces. Marrow edema about the right more than left L4-5 and left
more than right L5-S1 facets. Disc space T2 hyperintensity at L4-5
and L5-S1 with contiguous ventral fluid collection.

Conus medullaris and cauda equina: Conus extends to the T12 level.
Dominant ventral fluid collection spanning T11-12 to S2, with
diffuse compression of the thecal sac. At the level of L4 and L5
there is even thicker and more circumferential fluid collection with
marked compression of the nerve roots.

Paraspinal and other soft tissues: Body wall edema.

Disc levels:

The purulent disc spaces are narrowed and bulging. L3-4 disc space
narrowing and mild bulging.

These results were called by telephone at the time of interpretation
on [DATE] at [DATE] to provider Dr GIDEON, who verbally
acknowledged these results.
IMPRESSION: Discitis/osteomyelitis and septic facet arthritis at L4-5 and L5-S1.
Epidural abscess compresses the thecal sac from T11-12 to the level
of S2.

## 2020-05-23 IMAGING — MR MR LUMBAR SPINE W/O CM
4 of 5 series · 29 of 48 positions shown · non-contrast
Comparison: Abdomen and pelvis CT [DATE]

CLINICAL DATA: Endocarditis with urinary retention and back pain.
Evaluate for epidural abscess. On hemodialysis.

EXAM:
MRI THORACIC AND LUMBAR SPINE WITHOUT CONTRAST
TECHNIQUE: Multiplanar and multiecho pulse sequences of the thoracic and lumbar
spine were obtained without intravenous contrast.

[Series 1: T2 · sagittal · 4.0mm · 0.73mm/px · 8 of 18 slices shown (1 of 2)]
[im 1/18]
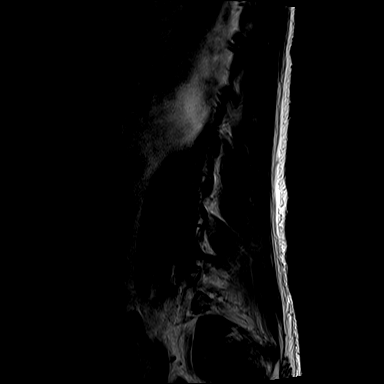
[im 3/18]
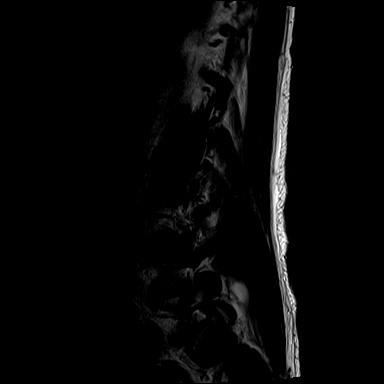
[im 5/18]
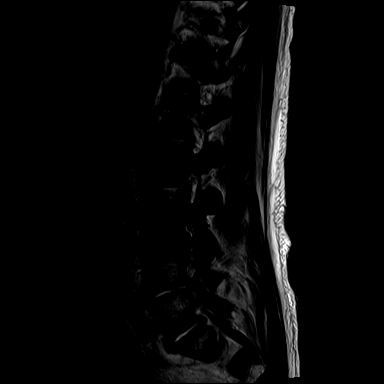
[im 8/18]
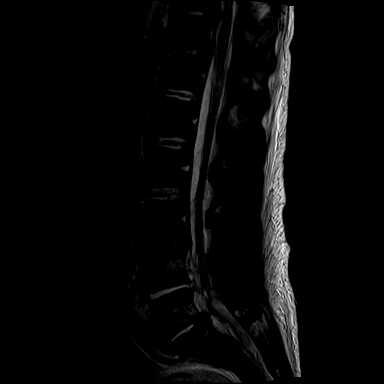
[im 10/18]
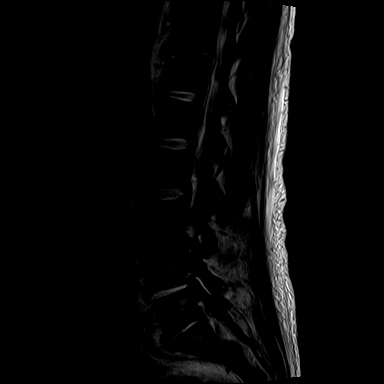
[im 13/18]
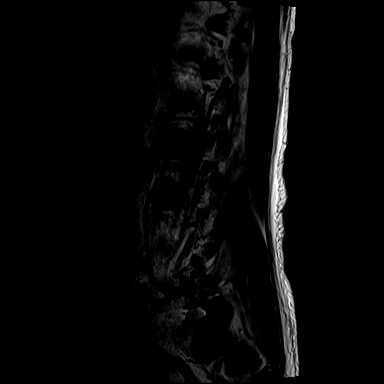
[im 15/18]
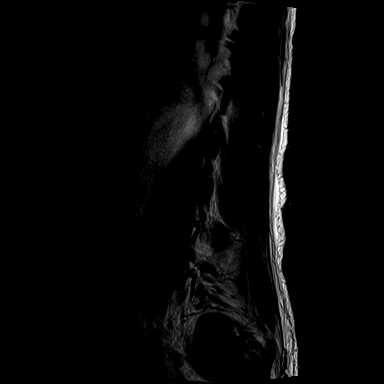
[im 18/18]
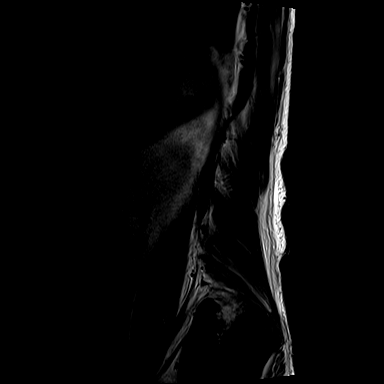

[Series 3: T1 · sagittal · 4.0mm · 0.88mm/px · 7 of 18 slices shown (1 of 2)]
[im 1/18]
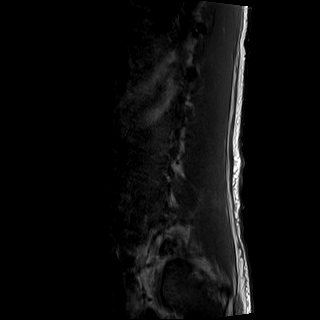
[im 3/18]
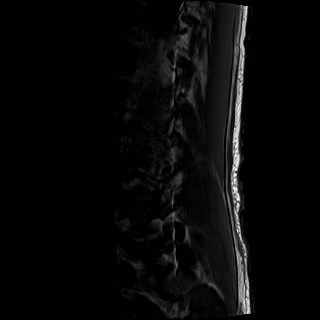
[im 6/18]
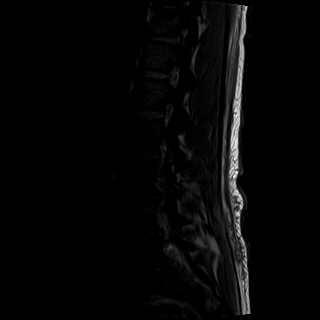
[im 9/18]
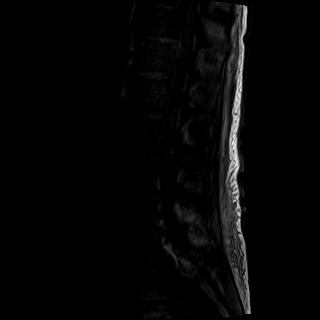
[im 12/18]
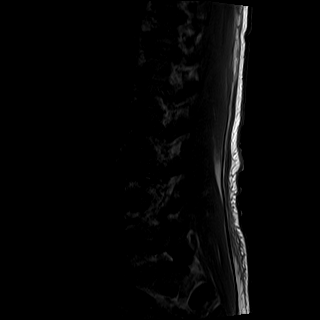
[im 15/18]
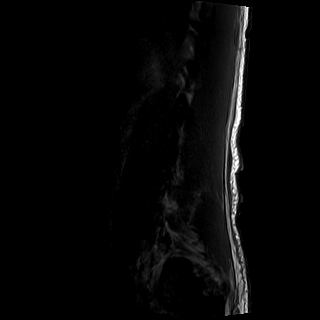
[im 18/18]
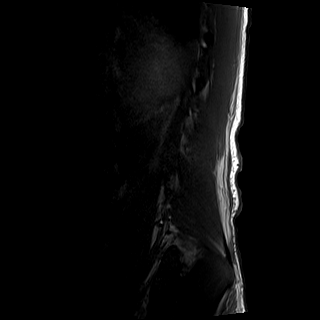

[Series 4: T2 · axial · 5.0mm · 0.57mm/px · z∈[-394,-154]mm · 9 of 31 slices shown (2 of 2)]
[im 1/31]
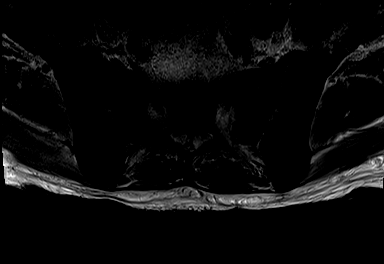
[im 6/31]
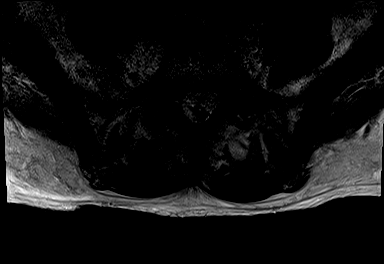
[im 11/31]
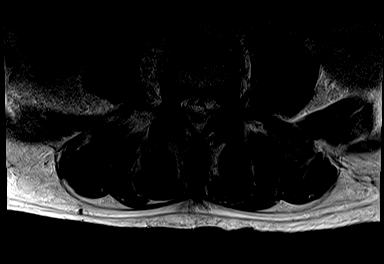
[im 13/31]
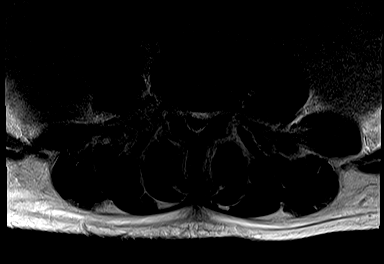
[im 16/31]
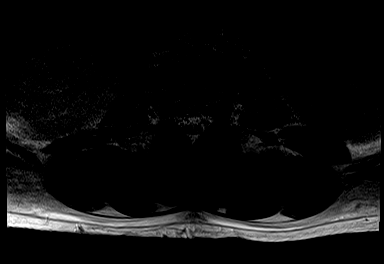
[im 18/31]
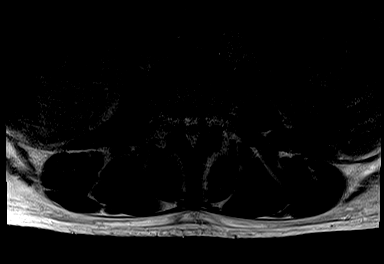
[im 21/31]
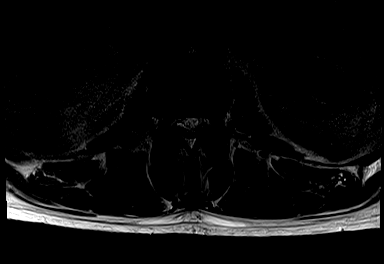
[im 26/31]
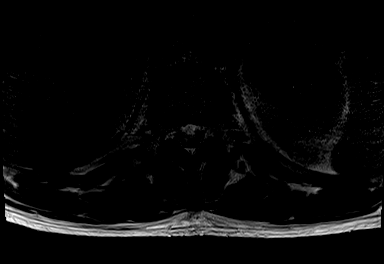
[im 31/31]
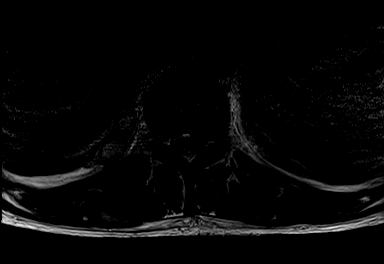

[Series 5: T1 · axial · 5.0mm · 0.34mm/px · z∈[-403,-189]mm · 5 of 31 slices shown (2 of 2)]
[im 1/31]
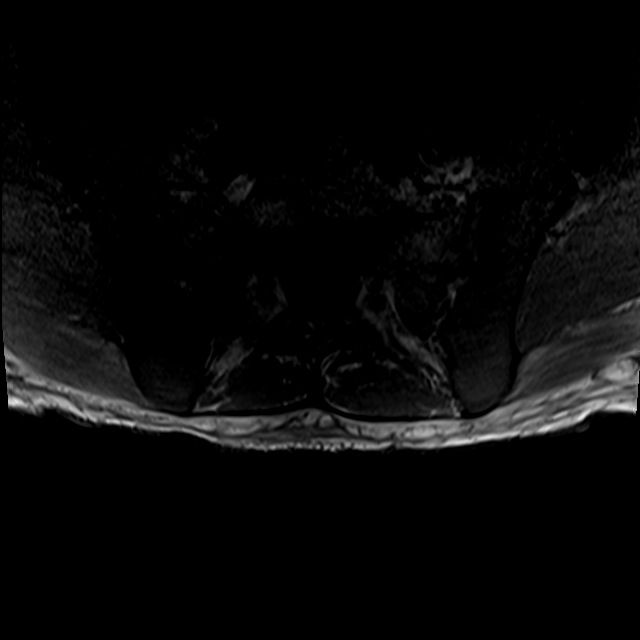
[im 6/31]
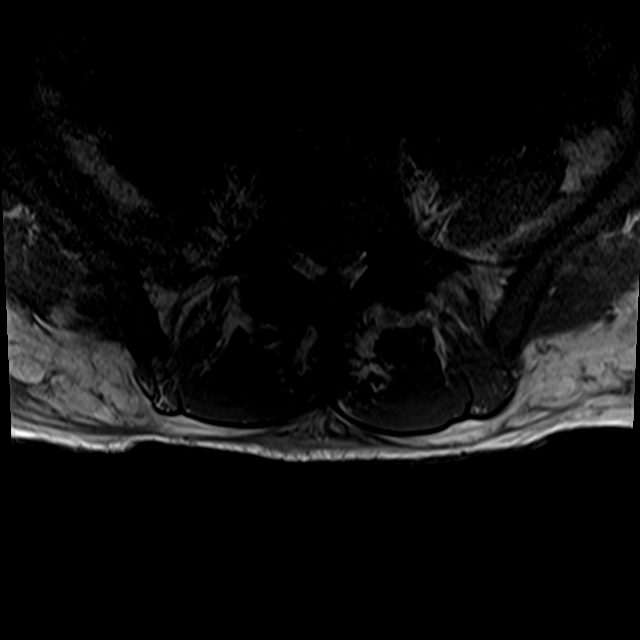
[im 11/31]
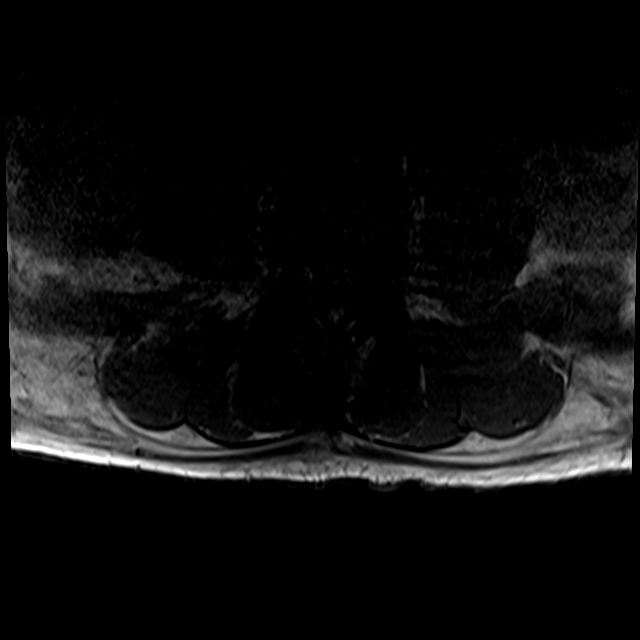
[im 16/31]
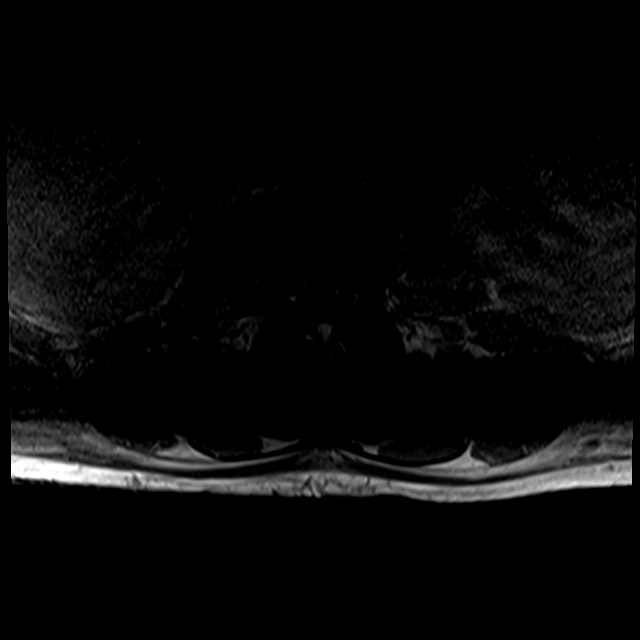
[im 26/31]
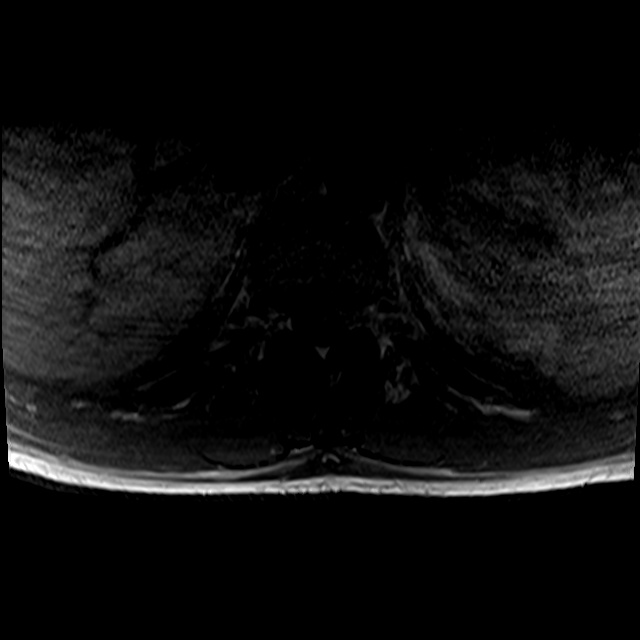

[29 of 48 positions shown; findings below may reference images not displayed]

FINDINGS: MRI THORACIC SPINE FINDINGS

Alignment:  Physiologic

Vertebrae: No fracture, evidence of discitis, or bone lesion.

Cord: Normal signal and morphology. Dorsal fluid collection
beginning at the level of T11-12 and contining into the lumbar
spine, as described below.

Paraspinal and other soft tissues: Cavitary/septic pneumonia
bilaterally with small pleural effusions the overall pattern is
similar to prior chest CT.

Disc levels:

Disc narrowing and small protrusions at T7-8 to T10-11. No
degenerative neural compression.

MRI LUMBAR SPINE FINDINGS

Segmentation: Five lumbar type vertebrae. Localization between the
thoracic and lumbar MRIs is misregistered but 12 thoracic vertebrae
and 5 lumbar type vertebrae are confirmed on chest and abdomen CT
comparison.

Alignment:  Physiologic

Vertebrae: Marrow edema on both sides of the L4-5 and L5-S1 disc
spaces. Marrow edema about the right more than left L4-5 and left
more than right L5-S1 facets. Disc space T2 hyperintensity at L4-5
and L5-S1 with contiguous ventral fluid collection.

Conus medullaris and cauda equina: Conus extends to the T12 level.
Dominant ventral fluid collection spanning T11-12 to S2, with
diffuse compression of the thecal sac. At the level of L4 and L5
there is even thicker and more circumferential fluid collection with
marked compression of the nerve roots.

Paraspinal and other soft tissues: Body wall edema.

Disc levels:

The purulent disc spaces are narrowed and bulging. L3-4 disc space
narrowing and mild bulging.

These results were called by telephone at the time of interpretation
on [DATE] at [DATE] to provider Dr GIDEON, who verbally
acknowledged these results.
IMPRESSION: Discitis/osteomyelitis and septic facet arthritis at L4-5 and L5-S1.
Epidural abscess compresses the thecal sac from T11-12 to the level
of S2.

## 2020-05-23 SURGERY — LUMBAR LAMINECTOMY/ DECOMPRESSION WITH MET-RX
Anesthesia: General | Site: Back

## 2020-05-23 MED ORDER — OXYCODONE HCL 5 MG PO TABS: 5.0000 mg | ORAL_TABLET | Freq: Four times a day (QID) | ORAL | Status: DC | PRN

## 2020-05-23 MED ORDER — MIDAZOLAM HCL 2 MG/2ML IJ SOLN
INTRAMUSCULAR | Status: AC
  Filled 2020-05-23: qty 2

## 2020-05-23 MED ORDER — MIDAZOLAM HCL 2 MG/2ML IJ SOLN
INTRAMUSCULAR | Status: DC | PRN
  Administered 2020-05-23: 2 mg via INTRAVENOUS

## 2020-05-23 MED ORDER — LIDOCAINE 2% (20 MG/ML) 5 ML SYRINGE
INTRAMUSCULAR | Status: DC | PRN
  Administered 2020-05-23: 60 mg via INTRAVENOUS

## 2020-05-23 MED ORDER — THROMBIN 5000 UNITS EX SOLR
CUTANEOUS | Status: AC
  Filled 2020-05-23: qty 5000

## 2020-05-23 MED ORDER — PHENYLEPHRINE HCL (PRESSORS) 10 MG/ML IV SOLN
INTRAVENOUS | Status: AC
  Filled 2020-05-23: qty 1

## 2020-05-23 MED ORDER — DEXAMETHASONE SODIUM PHOSPHATE 10 MG/ML IJ SOLN
INTRAMUSCULAR | Status: AC
  Filled 2020-05-23: qty 1

## 2020-05-23 MED ORDER — PHENYLEPHRINE HCL-NACL 10-0.9 MG/250ML-% IV SOLN
INTRAVENOUS | Status: DC | PRN
  Administered 2020-05-23: 25 ug/min via INTRAVENOUS

## 2020-05-23 MED ORDER — PROMETHAZINE HCL 25 MG/ML IJ SOLN: 6.2500 mg | INTRAMUSCULAR | Status: DC | PRN

## 2020-05-23 MED ORDER — FENTANYL CITRATE (PF) 250 MCG/5ML IJ SOLN
INTRAMUSCULAR | Status: AC
  Filled 2020-05-23: qty 5

## 2020-05-23 MED ORDER — ACETAMINOPHEN 325 MG PO TABS
650.0000 mg | ORAL_TABLET | Freq: Four times a day (QID) | ORAL | Status: DC | PRN
  Administered 2020-05-24 – 2020-06-01 (×8): 650 mg via ORAL
  Filled 2020-05-23 (×8): qty 2

## 2020-05-23 MED ORDER — ALBUTEROL SULFATE HFA 108 (90 BASE) MCG/ACT IN AERS
INHALATION_SPRAY | RESPIRATORY_TRACT | Status: AC
  Filled 2020-05-23: qty 6.7

## 2020-05-23 MED ORDER — SEVELAMER CARBONATE 800 MG PO TABS
800.0000 mg | ORAL_TABLET | Freq: Three times a day (TID) | ORAL | Status: DC
  Administered 2020-05-23 – 2020-05-24 (×2): 800 mg via ORAL
  Filled 2020-05-23 (×3): qty 1

## 2020-05-23 MED ORDER — ROCURONIUM BROMIDE 10 MG/ML (PF) SYRINGE
PREFILLED_SYRINGE | INTRAVENOUS | Status: DC | PRN
  Administered 2020-05-23: 40 mg via INTRAVENOUS
  Administered 2020-05-23: 60 mg via INTRAVENOUS

## 2020-05-23 MED ORDER — THROMBIN 5000 UNITS EX SOLR
OROMUCOSAL | Status: DC | PRN
  Administered 2020-05-23: 5 mL via TOPICAL

## 2020-05-23 MED ORDER — OXYCODONE HCL 5 MG/5ML PO SOLN: 5.0000 mg | Freq: Once | ORAL | Status: DC | PRN

## 2020-05-23 MED ORDER — ONDANSETRON HCL 4 MG/2ML IJ SOLN
INTRAMUSCULAR | Status: AC
  Filled 2020-05-23: qty 2

## 2020-05-23 MED ORDER — ESMOLOL HCL 100 MG/10ML IV SOLN
INTRAVENOUS | Status: AC
  Filled 2020-05-23: qty 10

## 2020-05-23 MED ORDER — CHLORHEXIDINE GLUCONATE 0.12 % MT SOLN: 15.0000 mL | Freq: Once | OROMUCOSAL | Status: AC

## 2020-05-23 MED ORDER — LIDOCAINE-EPINEPHRINE 1 %-1:100000 IJ SOLN
INTRAMUSCULAR | Status: AC
  Filled 2020-05-23: qty 1

## 2020-05-23 MED ORDER — PHENYLEPHRINE 40 MCG/ML (10ML) SYRINGE FOR IV PUSH (FOR BLOOD PRESSURE SUPPORT)
PREFILLED_SYRINGE | INTRAVENOUS | Status: AC
  Filled 2020-05-23: qty 10

## 2020-05-23 MED ORDER — THROMBIN 20000 UNITS EX SOLR
CUTANEOUS | Status: AC
  Filled 2020-05-23: qty 20000

## 2020-05-23 MED ORDER — OXYCODONE HCL 5 MG PO TABS: 5.0000 mg | ORAL_TABLET | Freq: Once | ORAL | Status: DC | PRN

## 2020-05-23 MED ORDER — PROPOFOL 10 MG/ML IV BOLUS
INTRAVENOUS | Status: AC
  Filled 2020-05-23: qty 20

## 2020-05-23 MED ORDER — ALBUMIN HUMAN 5 % IV SOLN: INTRAVENOUS | Status: DC | PRN

## 2020-05-23 MED ORDER — THROMBIN 20000 UNITS EX SOLR
CUTANEOUS | Status: DC | PRN
  Administered 2020-05-23: 20 mL via TOPICAL

## 2020-05-23 MED ORDER — ALBUTEROL SULFATE HFA 108 (90 BASE) MCG/ACT IN AERS
INHALATION_SPRAY | RESPIRATORY_TRACT | Status: DC | PRN
  Administered 2020-05-23: 5 via RESPIRATORY_TRACT

## 2020-05-23 MED ORDER — MUPIROCIN 2 % EX OINT
1.0000 "application " | TOPICAL_OINTMENT | Freq: Two times a day (BID) | CUTANEOUS | Status: AC
  Administered 2020-05-23 – 2020-05-28 (×10): 1 via NASAL
  Filled 2020-05-23 (×4): qty 22

## 2020-05-23 MED ORDER — ESMOLOL HCL 100 MG/10ML IV SOLN
INTRAVENOUS | Status: DC | PRN
  Administered 2020-05-23: 30 mg via INTRAVENOUS

## 2020-05-23 MED ORDER — DEXAMETHASONE SODIUM PHOSPHATE 10 MG/ML IJ SOLN
INTRAMUSCULAR | Status: DC | PRN
  Administered 2020-05-23: 10 mg via INTRAVENOUS

## 2020-05-23 MED ORDER — 0.9 % SODIUM CHLORIDE (POUR BTL) OPTIME
TOPICAL | Status: DC | PRN
  Administered 2020-05-23: 1000 mL

## 2020-05-23 MED ORDER — ONDANSETRON HCL 4 MG/2ML IJ SOLN
INTRAMUSCULAR | Status: DC | PRN
  Administered 2020-05-23: 4 mg via INTRAVENOUS

## 2020-05-23 MED ORDER — BUPIVACAINE HCL (PF) 0.25 % IJ SOLN
INTRAMUSCULAR | Status: DC | PRN
  Administered 2020-05-23: 5 mL

## 2020-05-23 MED ORDER — PHENYLEPHRINE 40 MCG/ML (10ML) SYRINGE FOR IV PUSH (FOR BLOOD PRESSURE SUPPORT)
PREFILLED_SYRINGE | INTRAVENOUS | Status: DC | PRN
  Administered 2020-05-23 (×2): 80 ug via INTRAVENOUS

## 2020-05-23 MED ORDER — SODIUM BICARBONATE 650 MG PO TABS
1300.0000 mg | ORAL_TABLET | Freq: Three times a day (TID) | ORAL | Status: DC
  Administered 2020-05-23 – 2020-05-25 (×8): 1300 mg via ORAL
  Filled 2020-05-23 (×8): qty 2

## 2020-05-23 MED ORDER — PROPOFOL 10 MG/ML IV BOLUS
INTRAVENOUS | Status: DC | PRN
  Administered 2020-05-23: 200 mg via INTRAVENOUS

## 2020-05-23 MED ORDER — SODIUM CHLORIDE 0.9 % IV SOLN: INTRAVENOUS | Status: DC | PRN

## 2020-05-23 MED ORDER — CHLORHEXIDINE GLUCONATE 0.12 % MT SOLN
OROMUCOSAL | Status: AC
  Administered 2020-05-23: 15 mL via OROMUCOSAL
  Filled 2020-05-23: qty 15

## 2020-05-23 MED ORDER — LIDOCAINE-EPINEPHRINE 1 %-1:100000 IJ SOLN
INTRAMUSCULAR | Status: DC | PRN
  Administered 2020-05-23: 5 mL via INTRADERMAL

## 2020-05-23 MED ORDER — OXYCODONE HCL 5 MG PO TABS
5.0000 mg | ORAL_TABLET | Freq: Four times a day (QID) | ORAL | Status: DC | PRN
  Administered 2020-05-23 – 2020-05-25 (×4): 5 mg via ORAL
  Filled 2020-05-23 (×4): qty 1

## 2020-05-23 MED ORDER — LIDOCAINE 2% (20 MG/ML) 5 ML SYRINGE
INTRAMUSCULAR | Status: AC
  Filled 2020-05-23: qty 5

## 2020-05-23 MED ORDER — BUPIVACAINE HCL (PF) 0.25 % IJ SOLN
INTRAMUSCULAR | Status: AC
  Filled 2020-05-23: qty 30

## 2020-05-23 MED ORDER — ACETAMINOPHEN 10 MG/ML IV SOLN: 1000.0000 mg | Freq: Once | INTRAVENOUS | Status: DC | PRN

## 2020-05-23 MED ORDER — CHLORHEXIDINE GLUCONATE CLOTH 2 % EX PADS
6.0000 | MEDICATED_PAD | Freq: Every day | CUTANEOUS | Status: DC
  Administered 2020-05-24 – 2020-05-26 (×3): 6 via TOPICAL

## 2020-05-23 MED ORDER — FENTANYL CITRATE (PF) 250 MCG/5ML IJ SOLN
INTRAMUSCULAR | Status: DC | PRN
  Administered 2020-05-23: 100 ug via INTRAVENOUS
  Administered 2020-05-23: 150 ug via INTRAVENOUS
  Administered 2020-05-23: 100 ug via INTRAVENOUS

## 2020-05-23 MED ORDER — ROCURONIUM BROMIDE 10 MG/ML (PF) SYRINGE
PREFILLED_SYRINGE | INTRAVENOUS | Status: AC
  Filled 2020-05-23: qty 10

## 2020-05-23 MED ORDER — SUGAMMADEX SODIUM 200 MG/2ML IV SOLN
INTRAVENOUS | Status: DC | PRN
  Administered 2020-05-23: 200 mg via INTRAVENOUS

## 2020-05-23 MED ORDER — HYDROMORPHONE HCL 1 MG/ML IJ SOLN: 0.2500 mg | INTRAMUSCULAR | Status: DC | PRN

## 2020-05-23 SURGICAL SUPPLY — 46 items
BAND RUBBER #18 3X1/16 STRL (MISCELLANEOUS) ×6 IMPLANT
BLADE CLIPPER SURG (BLADE) IMPLANT
BLADE SURG 11 STRL SS (BLADE) ×3 IMPLANT
BUR PRECISION FLUTE 5.0 (BURR) IMPLANT
BUR PRECISION MATCH 3.0 13 (BURR) ×3 IMPLANT
CANISTER SUCT 3000ML PPV (MISCELLANEOUS) ×3 IMPLANT
COVER WAND RF STERILE (DRAPES) ×3 IMPLANT
DECANTER SPIKE VIAL GLASS SM (MISCELLANEOUS) ×3 IMPLANT
DERMABOND ADVANCED (GAUZE/BANDAGES/DRESSINGS) ×1
DERMABOND ADVANCED .7 DNX12 (GAUZE/BANDAGES/DRESSINGS) ×2 IMPLANT
DRAPE C-ARM 42X72 X-RAY (DRAPES) ×6 IMPLANT
DRAPE LAPAROTOMY 100X72X124 (DRAPES) ×3 IMPLANT
DRAPE MICROSCOPE LEICA (MISCELLANEOUS) ×3 IMPLANT
DRAPE SURG 17X23 STRL (DRAPES) ×3 IMPLANT
DURAPREP 26ML APPLICATOR (WOUND CARE) ×3 IMPLANT
ELECT BLADE 6.5 EXT (BLADE) ×3 IMPLANT
ELECT REM PT RETURN 9FT ADLT (ELECTROSURGICAL) ×3
ELECTRODE REM PT RTRN 9FT ADLT (ELECTROSURGICAL) ×2 IMPLANT
GAUZE 4X4 16PLY RFD (DISPOSABLE) IMPLANT
GAUZE SPONGE 4X4 12PLY STRL (GAUZE/BANDAGES/DRESSINGS) IMPLANT
GLOVE BIOGEL PI IND STRL 7.5 (GLOVE) ×2 IMPLANT
GLOVE BIOGEL PI INDICATOR 7.5 (GLOVE) ×1
GLOVE ECLIPSE 7.5 STRL STRAW (GLOVE) ×3 IMPLANT
GLOVE EXAM NITRILE LRG STRL (GLOVE) IMPLANT
GLOVE EXAM NITRILE XL STR (GLOVE) IMPLANT
GLOVE EXAM NITRILE XS STR PU (GLOVE) IMPLANT
GOWN STRL REUS W/ TWL LRG LVL3 (GOWN DISPOSABLE) ×4 IMPLANT
GOWN STRL REUS W/ TWL XL LVL3 (GOWN DISPOSABLE) IMPLANT
GOWN STRL REUS W/TWL 2XL LVL3 (GOWN DISPOSABLE) IMPLANT
GOWN STRL REUS W/TWL LRG LVL3 (GOWN DISPOSABLE) ×2
GOWN STRL REUS W/TWL XL LVL3 (GOWN DISPOSABLE)
HEMOSTAT POWDER KIT SURGIFOAM (HEMOSTASIS) ×3 IMPLANT
KIT BASIN OR (CUSTOM PROCEDURE TRAY) ×3 IMPLANT
KIT TURNOVER KIT B (KITS) ×3 IMPLANT
NEEDLE HYPO 22GX1.5 SAFETY (NEEDLE) ×3 IMPLANT
NEEDLE SPNL 18GX3.5 QUINCKE PK (NEEDLE) ×3 IMPLANT
NS IRRIG 1000ML POUR BTL (IV SOLUTION) ×3 IMPLANT
PACK LAMINECTOMY NEURO (CUSTOM PROCEDURE TRAY) ×3 IMPLANT
SPONGE LAP 4X18 RFD (DISPOSABLE) IMPLANT
SUT MNCRL AB 3-0 PS2 18 (SUTURE) IMPLANT
SUT VIC AB 0 CT1 18XCR BRD8 (SUTURE) IMPLANT
SUT VIC AB 0 CT1 8-18 (SUTURE)
SUT VIC AB 2-0 CP2 18 (SUTURE) ×3 IMPLANT
TOWEL GREEN STERILE (TOWEL DISPOSABLE) ×3 IMPLANT
TOWEL GREEN STERILE FF (TOWEL DISPOSABLE) ×3 IMPLANT
WATER STERILE IRR 1000ML POUR (IV SOLUTION) ×3 IMPLANT

## 2020-05-23 NOTE — Brief Op Note (Signed)
05/23/2020  10:02 AM  PATIENT:  Francee Piccolo Styer  45 y.o. male  PRE-OPERATIVE DIAGNOSIS:  LUMBAR EPIDURAL ABSCESS  POST-OPERATIVE DIAGNOSIS:  LUMBAR EPIDURAL ABSCESS  PROCEDURE:  L4-5, L5-S1 MIS laminectomies for evacuation of epidural abscess   SURGEON:  Surgeon(s) and Role:    * Selma Rodelo, Clovis Pu, MD - Primary  PHYSICIAN ASSISTANT:   ASSISTANTS: none   ANESTHESIA:   general  EBL:  10cc   BLOOD ADMINISTERED:none  DRAINS: none   LOCAL MEDICATIONS USED:  LIDOCAINE   SPECIMEN:  Source of Specimen:  Epidural abscess  DISPOSITION OF SPECIMEN:  Micro  COUNTS:  YES  TOURNIQUET:  * No tourniquets in log *  DICTATION: .Note written in EPIC  PLAN OF CARE: Admit to inpatient   PATIENT DISPOSITION:  PACU - hemodynamically stable.   Delay start of Pharmacological VTE agent (>24hrs) due to surgical blood loss or risk of bleeding: yes

## 2020-05-23 NOTE — Progress Notes (Addendum)
Subjective:  Patient seen post-laminectomy and excavation. He reports doing well. Denies pain. Conveys no changes to weakness as of yet. Discussed echo findings, and plans for CT surgery evaluation.   Objective:  Vital signs in last 24 hours: Vitals:   05/22/20 1622 05/22/20 1900 05/22/20 1957 05/23/20 0427  BP: (!) 150/85 (!) 154/82 (!) 155/89 (!) 158/104  Pulse: 93 87 84 81  Resp: 16 16 20 18   Temp: 98.3 F (36.8 C) 99.6 F (37.6 C) 98.7 F (37.1 C) 98.2 F (36.8 C)  TempSrc: Oral Oral Oral Oral  SpO2: 98% 100% 97% 96%  Weight:    81.3 kg  Height:       Weight change: 6.456 kg  Intake/Output Summary (Last 24 hours) at 05/23/2020 05/25/2020 Last data filed at 05/23/2020 0900 Gross per 24 hour  Intake 1730 ml  Output 2950 ml  Net -1220 ml   Physical Exam Constitutional:      General: He is not in acute distress.    Appearance: He is ill-appearing. He is not diaphoretic.     Comments: ill appearing, cachectic male seen lying in bed, NAD  Cardiovascular:     Rate and Rhythm: Normal rate and regular rhythm.     Heart sounds: Murmur heard.   Systolic murmur is present with a grade of 2/6.   Musculoskeletal:     Comments: Symmetric 1-2+ pitting edema below ankles. Mild pitting edema along shins  Skin:    Coloration: Skin is jaundiced.  Neurological:     Mental Status: He is alert.      Assessment/Plan:  Principal Problem:   AKI (acute kidney injury) (HCC) Active Problems:   Endocarditis   Urinary retention   Hyperkalemia   Microcytic anemia   MRSA infection  45 year old male with history of IV drug use and untreated Hep C presenting with weakness, dyspnea, pleuritic chest pain, urinary retention, and pitting edema. He was recently admitted to [facility] and found to have tricuspid endocarditis with MRSA bacteremia, and pulmonary septic emboli. He was initially being treated with vancomycin, then switched to linezolid in light of persistently positive  blood cultures. He left [facility] AMA a few days ago, and is now admitted as a result of worsening symptoms. Found to have compressive epidural abscess from T11/12 - S2, and lumbar osteomyelitis (s/p laminectomy & evacuation on 1/29).      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  Repeat TTE here shows 1 x 3 cm vegetation on the septal leaflet of the tricuspid valve, blood cultures here no growth to date.  Fortunately, LVEF is preserved and TR is only mild at this time.  However, given the size of the vegetation, CT surgery consulted for evaluation.  In addition, his combination of back pain, leg weakness, and urinary retention, concerning for epidural abscess, subsequently corroborated by MRI of the T/L-spine. 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 Neurosurgery consequently consulted.   -Appreciate Neurosurgery  -Minimally invasive laminectomy & evacuation earlier this morning  -Activity as tolerated  -okay for DVT chemoprophylaxis 1/31, okay for heparin during HD today if needed -Appreciate ID recs:   -q 2 days Daptomycin and BID doxycycline -Consulted CT surg, f/u recs -f/u blood cultures from admission (no growth thus far)  -Will consider additional imaging to evaluate for other embolic locations  as indicated   Acute renal failure  hyperkalemia  elevated AGMA Hyponatremia - improved Hypocalcemia Patient presented with two days of anuria. On admission, creatinine of 7.23, BUN 184, K+ 6.3; Anion gap metabolic acidosis. Pyuria and hematuria present on urinalysis. Setting of possible bacteremia; and known pulmonary septic emboli. Likely multifactorial, possibly some contribution of acute obstruction. Renal u/s significant for bladder distension and mild bilateral hydronephrosis.  Foley placed with difficulty, phimosis noted by urology. Patient has had good UOP following foley catheter placement, with some improvement in renal functioning. Cr trending down at 4.11. Potassium additionally downtrending with auto diuresis and Lokelma, now at 4.9. Patient s/p placement of temporary dialysis catheter. C3 low at 60, normal C4.   -Appreciate nephrology recommendations  -planning for HD today  -Lokelma 10g daily  -No renal biopsy at this time  -Ca carbonate 500 TID, correct to 8s  -Sodium Bicarbonate TID -Keep Foley in place at least 7 days, Foley care as per urology recommendations -f/u hep c    Hypervolemia Likely due to combination of acute renal failure and possible new onset CHF. He does have visible JVP, but this could be due to tricuspid regurgitation, on POC [place] LV function appears close to normal and IVC only moderately dilated with respiratory variations consistent with mildly increased CVP. TTE reassuring with preserved LVEF, normal CVP estimation by IVC. Renal functioning improving with auto diuresis.   -Continue Monitoring UOP -Started IV Lasix 80 mg BID -HD as above -No hypertensives at this time out of consideration of renal perfusion    Microcytic anemia likely 2/2 Chronic Disease Acute Anemia Patient with hx of IV drug use, presenting with tricuspid endocarditis and pulmonary septic emboli. Notable microcytic anemia. Hypo-proliferative per corrected retic count. Elevated ferritin, low iron and TIBC. Drop in hemoglobin to 6.7 overnight.  Etiology of anemia likely multifactorial with prime component of anemia of chronic inflammation. Hgb stable at 7.5 following 1 unit pRBCs on 1/28  -Monitor CBC    Hep C Patient with hx of IV drug use. Reporting hx of untreated Hep C.  -HCV PCR pending    LOS: 2 days   [name], Medical Student 05/23/2020, 9:09 AM     Attestation for Student Documentation:  I personally was present and  performed or re-performed the history, physical exam and medical decision-making activities of this service and have verified that the service and findings are accurately documented in the student's note.  Remo Lipps, MD 05/23/2020, 2:57 PM

## 2020-05-23 NOTE — Op Note (Signed)
PATIENT: Steven Cortez  DAY OF SURGERY: 05/23/20   PRE-OPERATIVE DIAGNOSIS:  Lumbar epidural abscess   POST-OPERATIVE DIAGNOSIS:  Same   PROCEDURE:  L4-5, L5-S1 minimally invasive laminectomy for evacuation of epidural abscess   SURGEON:  Surgeon(s) and Role:    Jadene Pierini, MD - Primary   ANESTHESIA: ETGA   BRIEF HISTORY: This is a 45 year old man w/ a h/o IVDU and bacteremia with endocarditis who presented with urinary retention, back pain, and bilateral lower extremity weakness, MRI showed an extensive epidural abscess throughout the lumbar spine up just past the T-L junction. Given his neurologic dysfunction, I recommended evacuation a minimally invasive laminectomy. This was discussed with the patient as well as risks, benefits, and alternatives and the patient wished to proceed with surgical treatment. I was clear that, given the unknown duration of symptoms, there is certainly no guarantee that he would have an improvement in neurologic function and that this procedure was to help maximize the chances of improvement.    OPERATIVE DETAIL: The patient was taken to the operating room and placed on the OR table in the prone position. A formal time out was performed with two patient identifiers and confirmed the operative site. Anesthesia was induced by the anesthesia team. The operative site was marked, hair was clipped with surgical clippers, the area was then prepped and draped in a sterile fashion. Fluoroscopy was used to identify the surgical level prior to incision.   A 2cm incision was then marked 1cm off to the left of midline at the level of the pedicle of L5 to allow access to both L5-S1 and L4-5 if needed. L5-S1 was radiographically more severe, so this level was addressed first. The fascia was incised sharply and serial dilators were docked to the left L5-S1 lamino-facet junction using fluoroscopy to confirm position as well as perform a second count to confirm the correct  surgical level. After a final dilator was placed, a tubular retractor was placed over this and secured to the table. The operating microscope was draped and brought into the field. Anatomy was palpated and confirmed, monopolar cautery was used to expose the facet, lamina, and a portion of the spinous process to confirm orientation. A high speed drill and kerrison rongeurs were then used to create a hemilaminotomy and small medial facetectomy to access the lateral recess without significant retraction. The ligamentum flavum was resected and the thecal sac and traversing nerve root were identified. Purulent material was immediately obtained and sent for culture. The superficial component tracked back towards the left L5-S1 facet. After opening more ligamentum flavum, there was a larger collection in the lateral recess / ventral epidural space that was very large. This was evacuated extensively and a right angle ball tipped probe was used to break up any ventral loculations. There was a clear abscess wall that was densely adherent to the thecal sac and could not be separated safely. With continued probing cranially, I kept getting more run down of purulent material, so I decided to proceed with a laminotomy at L4-5. The epidural space was extensively irrigated and the thecal sac and traversing nerve root were well decompressed.   Hemostasis was obtained, the wound was copiously irrigated, and the tube was removed while using the microscope to confirm hemostasis of the muscle edges.   Fluoroscopy was then used with the same technique as above to advance a [employer] tube through the same incision to the left L4-5 lamino-facet junction. A hemilaminotomy was performed here  as well again using the same technique as above. The epidural space had significant remaining purulent material ventrally. I repeated the above procedure and broke up loculations and got an impressive amount of purulent material evacuated. There was  again an abscess wall that was densely adherent to the thecal sac, less thick than at L5-S1. Afterwards, the ventral compression was resolved and appeared well decompressed.   Hemostasis was obtained, the wound was copiously irrigated, and the tube was removed while using the microscope to confirm hemostasis of the muscle edges. All instrument and sponge counts were correct and the incision was then closed in layers. The patient was then returned to anesthesia for emergence. No apparent complications at the completion of the procedure.    EBL:  60mL   DRAINS: none   SPECIMENS: Epidural abscess cultures   Jadene Pierini, MD 05/23/20 8:16 AM

## 2020-05-23 NOTE — Anesthesia Postprocedure Evaluation (Signed)
Anesthesia Post Note  Patient: Steven Cortez  Procedure(s) Performed: MINIMALLY INVASIVE LUMBAR FIVE LAMINECTOMY FOR EVACUATION OF EPIDURAL ABSCESS (N/A Back)     Patient location during evaluation: PACU Anesthesia Type: General Level of consciousness: awake Pain management: pain level controlled Vital Signs Assessment: post-procedure vital signs reviewed and stable Respiratory status: spontaneous breathing, nonlabored ventilation, respiratory function stable and patient connected to nasal cannula oxygen Cardiovascular status: blood pressure returned to baseline and stable Postop Assessment: no apparent nausea or vomiting Anesthetic complications: no   No complications documented.  Last Vitals:  Vitals:   05/23/20 1040 05/23/20 1055  BP: 133/65 129/63  Pulse: 85 78  Resp: 19 14  Temp:  (!) 36.4 C  SpO2: 95% 95%    Last Pain:  Vitals:   05/23/20 1055  TempSrc:   PainSc: 0-No pain                 Adella Manolis P Kylor Valverde

## 2020-05-23 NOTE — Progress Notes (Signed)
Received pt from OR, still sleepy but easily arousable. No complaints of any pain or discomfort at this time.

## 2020-05-23 NOTE — Progress Notes (Signed)
Attempted to call report three times.

## 2020-05-23 NOTE — Progress Notes (Signed)
Neurosurgery Service Post-operative progress note  Assessment & Plan: 45 y.o. man s/p L4-5/L5-S1 MIS lami for evac of epidural abscess, seen in PACU, MAEx4, still recovering from anesthesia but strength and pain already improved from preop, currently 3 to 4-/5 in proximal BLE and 4/5 distally.  -activity as tolerated, no restrictions from a spine standpoint, does not need a brace. Wound is covered w/ dermabond, pt can bathe/shower whenever he pleases.  -Intra-op findings w/ large abscess, significant purulent material, sent for Cx x2 -will continue to follow to see if his neurologic exam improves, will likely still have significant back pain 2/2 the infectious facet arthritis but hopefully improved after decompression -okay for DVT chemoprophylaxis 1/31, okay for heparin during HD today if needed -I tried to call his sister, who's listed as his emergency contact, to update her and she did not answer  Jadene Pierini  05/23/20 10:11 AM

## 2020-05-23 NOTE — Consult Note (Signed)
Neurosurgery Consultation  Reason for Consult: Leg weakness / epidural abscess Referring Physician: Sandre Kitty  CC: Back pain  HPI: This is a 45 y.o. man w/ h/o IVDU and HCV, recently admitted for endocarditis 2/2 MRSA and left AMA. He presented with back pain, cough , dyspnea, BLE edema and BLE weakness with urinary retention. He feels like both of his legs are diffusely weak, denies any numbness, no fecal incontinence, he is not sure how long they have been weak or how long he had the urinary retention. He denies any symptoms in the upper extremities or neck.  ROS: A 14 point ROS was performed and is negative except as noted in the HPI.   PMHx:  Past Medical History:  Diagnosis Date  . Acute renal failure (HCC) 05/22/2020  . Endocarditis    FamHx: History reviewed. No pertinent family history. SocHx:  reports that he has been smoking cigarettes. He has a 14.00 pack-year smoking history. He has never used smokeless tobacco. He reports previous alcohol use. He reports current drug use.  Exam: Vital signs in last 24 hours: Temp:  [98 F (36.7 C)-99.6 F (37.6 C)] 98.2 F (36.8 C) (01/29 0427) Pulse Rate:  [81-93] 81 (01/29 0427) Resp:  [16-20] 18 (01/29 0427) BP: (148-166)/(76-104) 158/104 (01/29 0427) SpO2:  [96 %-100 %] 96 % (01/29 0427) Weight:  [81.3 kg] 81.3 kg (01/29 0427) General: Awake, alert, cooperative, lying in bed, appears uncomfortable Head: Normocephalic and atruamatic HEENT: Neck supple Pulmonary: increased WOB with supplemental O2 but appears comfortable with RR Cardiac: RRR, +JVP Abdomen: S NT ND Extremities: Diffuse BLE edema Neuro: AOx3, Strength 5/5 in BUE, SILTx4 LLE: HF 1/5, KE 2/5, DF 4-/5, EHL 4-/5, PF 4-/5 RLE: HF 1/5, KE 2/5, DF 4-/5, EHL 4-/5, PF 4-/5 Foley in place  Assessment and Plan: 45 y.o. man w/ BLE weakness and urinary retention in the setting of MRSA bacteremia. MRI L-spine personally reviewed, which shows fluid collection along the length of  the lumbar spine ventrally from the T11-T12 disc space to the sacrum. Compression is worst at L4-5 especially to the left at L5, septic facet arthritis at multiple levels as well as discitis at L4-5 and L5-S1, findings c/w discitis / OM / epidural abscess.   -OR now for MIS laminectomy and evacuation -discussed w/ pt extensively  -post op can return to Conni Elliot, MD 05/23/20 6:33 AM [facility]

## 2020-05-23 NOTE — Anesthesia Preprocedure Evaluation (Addendum)
Anesthesia Evaluation  Patient identified by MRN, date of birth, ID band Patient awake    Reviewed: Allergy & Precautions, NPO status , Patient's Chart, lab work & pertinent test results  Airway Mallampati: II  TM Distance: >3 FB Neck ROM: Full    Dental  (+) Loose, Poor Dentition, Edentulous Upper   Pulmonary Current Smoker and Patient abstained from smoking.,    Pulmonary exam normal breath sounds clear to auscultation       Cardiovascular Normal cardiovascular exam+ Valvular Problems/Murmurs  Rhythm:Regular Rate:Normal  ECG: SR, rate 91  Endocarditis  ECHO: 1. Left ventricular ejection fraction, by estimation, is 60 to 65%. The left ventricle has normal function. The left ventricle has no regional wall motion abnormalities. There is mild left ventricular hypertrophy. Left ventricular diastolic parameters were normal. 2. Right ventricular systolic function is normal. The right ventricular size is normal. 3. The mitral valve is normal in structure. No evidence of mitral valve regurgitation. 4. Large mobile echogenic mass noted on the RV side of the tricuspid valve septal leaflet, measuring 1 x 3 cm, suggestive of vegetation. The tricuspid valve is abnormal. 5. The aortic valve is tricuspid. Aortic valve regurgitation is not visualized. 6. The inferior vena cava is normal in size with greater than 50% respiratory variability, suggesting right atrial pressure of 3 mmHg.   Neuro/Psych negative neurological ROS  negative psych ROS   GI/Hepatic negative GI ROS, (+)     substance abuse  IV drug use, Hepatitis -, C  Endo/Other  negative endocrine ROS  Renal/GU CRF, ARF and ESRFRenal disease     Musculoskeletal negative musculoskeletal ROS (+)   Abdominal   Peds  Hematology  (+) anemia ,   Anesthesia Other Findings EPIDURAL ABSCESS  Reproductive/Obstetrics                             Anesthesia Physical Anesthesia Plan  ASA: III  Anesthesia Plan: General   Post-op Pain Management:    Induction: Intravenous  PONV Risk Score and Plan: 1 and Ondansetron, Dexamethasone, Midazolam and Treatment may vary due to age or medical condition  Airway Management Planned: Oral ETT  Additional Equipment:   Intra-op Plan:   Post-operative Plan: Possible Post-op intubation/ventilation and Extubation in OR  Informed Consent: I have reviewed the patients History and Physical, chart, labs and discussed the procedure including the risks, benefits and alternatives for the proposed anesthesia with the patient or authorized representative who has indicated his/her understanding and acceptance.     Dental advisory given  Plan Discussed with: CRNA and Surgeon  Anesthesia Plan Comments:       Anesthesia Quick Evaluation

## 2020-05-23 NOTE — Transfer of Care (Signed)
Immediate Anesthesia Transfer of Care Note  Patient: Steven Cortez  Procedure(s) Performed: MINIMALLY INVASIVE LUMBAR FIVE LAMINECTOMY FOR EVACUATION OF EPIDURAL ABSCESS (N/A Back)  Patient Location: PACU  Anesthesia Type:General  Level of Consciousness: patient cooperative and responds to stimulation  Airway & Oxygen Therapy: Patient Spontanous Breathing and Patient connected to face mask oxygen  Post-op Assessment: Report given to RN and Post -op Vital signs reviewed and stable  Post vital signs: Reviewed and stable  Last Vitals:  Vitals Value Taken Time  BP 129/68 05/23/20 1025  Temp 36.1 C 05/23/20 1025  Pulse 86 05/23/20 1032  Resp 24 05/23/20 1032  SpO2 97 % 05/23/20 1032  Vitals shown include unvalidated device data.  Last Pain:  Vitals:   05/23/20 1025  TempSrc:   PainSc: 0-No pain      Patients Stated Pain Goal: 2 (05/22/20 2359)  Complications: No complications documented.

## 2020-05-23 NOTE — Progress Notes (Signed)
[facility] Progress Note   Subjective:   Seen at bedside. Extensive epidural abscess noted on MRI s/p OR this AM.  He has no new concerns for me.  Plans for HD today.     Objective Vitals:   05/23/20 0427 05/23/20 1025 05/23/20 1040 05/23/20 1055  BP: (!) 158/104 129/68 133/65 129/63  Pulse: 81 92 85 78  Resp: 18 17 19 14   Temp: 98.2 F (36.8 C) (!) 97 F (36.1 C)  (!) 97.5 F (36.4 C)  TempSrc: Oral     SpO2: 96% 99% 95% 95%  Weight: 81.3 kg     Height:       Physical Exam General: uncomfortable by nontoxic Heart: hyperdynamic, no rub Lungs: clear ant, basilar rales R > L Abdomen: soft, nontender Extremities: 2+ edema to knees Skin: no janeway lesions, splinter hemorrhages; no petechiae; R calf with traumatic scab with some surrounding erythema that doesn't look infected Dialysis Access:  RIJ temp HD catheter  Additional Objective Labs: Basic Metabolic Panel: Recent Labs  Lab 05/21/20 1850 05/22/20 0226 05/22/20 2118 05/23/20 0650  NA 127* 129* 130* 134*  K 5.8* 5.7* 5.3* 4.9  CL 98 99 100 104  CO2 12* 12* 13* 15*  GLUCOSE 147* 115* 109* 92  BUN 184* 184* 171* 165*  CREATININE 6.56* 6.09* 4.81* 4.11*  CALCIUM 6.7* 6.5* 6.5* 6.9*  PHOS >30.0*  --   --   --    Liver Function Tests: Recent Labs  Lab 05/21/20 0831 05/21/20 1850 05/22/20 0226  AST 30  --  39  ALT 38  --  37  ALKPHOS 297*  --  255*  BILITOT 1.1  --  1.2  PROT 6.1*  --  5.9*  ALBUMIN 1.7* 1.5* 1.7*   No results for input(s): LIPASE, AMYLASE in the last 168 hours. CBC: Recent Labs  Lab 05/21/20 0831 05/21/20 1011 05/21/20 1850 05/22/20 0226 05/22/20 2118 05/23/20 0423  WBC 18.9*  --  15.9* 14.3*  --  18.1*  NEUTROABS  --   --  14.0*  --   --   --   HGB 8.1*   < > 7.3* 6.7* 7.4* 7.5*  HCT 23.7*   < > 21.8* 18.3* 20.8* 21.6*  MCV 79.0*  --  78.4* 75.3*  --  78.8*  PLT 170  --  154 160  --  188   < > = values in this interval not displayed.   Blood Culture     Component Value Date/Time   SDES BLOOD SITE NOT SPECIFIED 05/21/2020 0918   SPECREQUEST  05/21/2020 0918    BOTTLES DRAWN AEROBIC ONLY Blood Culture results may not be optimal due to an inadequate volume of blood received in culture bottles   CULT  05/21/2020 0918    NO GROWTH < 24 HOURS Performed at [facility], [place] [address], [place], [place] [address]    REPTSTATUS PENDING 05/21/2020 6948    Cardiac Enzymes: Recent Labs  Lab 05/22/20 2118  CKTOTAL 103   CBG: No results for input(s): GLUCAP in the last 168 hours. Iron Studies:  Recent Labs    05/21/20 1850 05/22/20 0226  IRON 40*  --   TIBC 189*  --   FERRITIN  --  343*   @lablastinr3 @ Studies/Results: MR THORACIC SPINE WO CONTRAST  Result Date: 05/23/2020 CLINICAL DATA:  Endocarditis with urinary retention and back pain. Evaluate for epidural abscess. On hemodialysis. EXAM: MRI THORACIC AND LUMBAR SPINE WITHOUT CONTRAST TECHNIQUE:  Multiplanar and multiecho pulse sequences of the thoracic and lumbar spine were obtained without intravenous contrast. COMPARISON:  Abdomen and pelvis CT 05/18/2020 FINDINGS: MRI THORACIC SPINE FINDINGS Alignment:  Physiologic Vertebrae: No fracture, evidence of discitis, or bone lesion. Cord: Normal signal and morphology. Dorsal fluid collection beginning at the level of T11-12 and contining into the lumbar spine, as described below. Paraspinal and other soft tissues: Cavitary/septic pneumonia bilaterally with small pleural effusions the overall pattern is similar to prior chest CT. Disc levels: Disc narrowing and small protrusions at T7-8 to T10-11. No degenerative neural compression. MRI LUMBAR SPINE FINDINGS Segmentation: Five lumbar type vertebrae. Localization between the thoracic and lumbar MRIs is misregistered but 12 thoracic vertebrae and 5 lumbar type vertebrae are confirmed on chest and abdomen CT comparison. Alignment:  Physiologic Vertebrae: Marrow edema on both sides of the L4-5  and L5-S1 disc spaces. Marrow edema about the right more than left L4-5 and left more than right L5-S1 facets. Disc space T2 hyperintensity at L4-5 and L5-S1 with contiguous ventral fluid collection. Conus medullaris and cauda equina: Conus extends to the T12 level. Dominant ventral fluid collection spanning T11-12 to S2, with diffuse compression of the thecal sac. At the level of L4 and L5 there is even thicker and more circumferential fluid collection with marked compression of the nerve roots. Paraspinal and other soft tissues: Body wall edema. Disc levels: The purulent disc spaces are narrowed and bulging. L3-4 disc space narrowing and mild bulging. These results were called by telephone at the time of interpretation on 05/23/2020 at 5:11 am to provider Dr Allyson Sabal, who verbally acknowledged these results. 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 Epidural abscess compresses the thecal sac from T11-12 to the level of S2. Electronically Signed   By: Monte Fantasia M.D.   On: 05/23/2020 05:11   MR LUMBAR SPINE WO CONTRAST  Result Date: 05/23/2020 CLINICAL DATA:  Endocarditis with urinary retention and back pain. Evaluate for epidural abscess. On hemodialysis. EXAM: MRI THORACIC AND LUMBAR SPINE WITHOUT CONTRAST TECHNIQUE: Multiplanar and multiecho pulse sequences of the thoracic and lumbar spine were obtained without intravenous contrast. COMPARISON:  Abdomen and pelvis CT 05/18/2020 FINDINGS: MRI THORACIC SPINE FINDINGS Alignment:  Physiologic Vertebrae: No fracture, evidence of discitis, or bone lesion. Cord: Normal signal and morphology. Dorsal fluid collection beginning at the level of T11-12 and contining into the lumbar spine, as described below. Paraspinal and other soft tissues: Cavitary/septic pneumonia bilaterally with small pleural effusions the overall pattern is similar to prior chest CT. Disc levels: Disc narrowing and small protrusions at T7-8 to T10-11. No  degenerative neural compression. MRI LUMBAR SPINE FINDINGS Segmentation: Five lumbar type vertebrae. Localization between the thoracic and lumbar MRIs is misregistered but 12 thoracic vertebrae and 5 lumbar type vertebrae are confirmed on chest and abdomen CT comparison. Alignment:  Physiologic Vertebrae: Marrow edema on both sides of the L4-5 and L5-S1 disc spaces. Marrow edema about the right more than left L4-5 and left more than right L5-S1 facets. Disc space T2 hyperintensity at L4-5 and L5-S1 with contiguous ventral fluid collection. Conus medullaris and cauda equina: Conus extends to the T12 level. Dominant ventral fluid collection spanning T11-12 to S2, with diffuse compression of the thecal sac. At the level of L4 and L5 there is even thicker and more circumferential fluid collection with marked compression of the nerve roots. Paraspinal and other soft tissues: Body wall edema. Disc levels: The purulent disc spaces are narrowed and bulging. L3-4  disc space narrowing and mild bulging. These results were called by telephone at the time of interpretation on 05/23/2020 at 5:11 am to provider Dr Allyson Sabal, who verbally acknowledged these results. 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 Epidural abscess compresses the thecal sac from T11-12 to the level of S2. Electronically Signed   By: Monte Fantasia M.D.   On: 05/23/2020 05:11   IR Fluoro Guide CV Line Right  Result Date: 05/22/2020 INDICATION: 45 year old male referred for non tunneled hemodialysis catheter EXAM: CENTRAL VENOUS HEMODIALYSIS CATHETER PLACEMENT WITH ULTRASOUND AND FLUOROSCOPIC GUIDANCE MEDICATIONS: NONE ANESTHESIA/SEDATION: None FLUOROSCOPY TIME:  Fluoroscopy Time: 0 minutes 6 seconds (1 mGy). COMPLICATIONS: None PROCEDURE: Informed written consent was obtained from the patient's family after a discussion of the risks, benefits, and alternatives to treatment. Questions regarding the procedure were  encouraged and answered. The right neck was prepped with chlorhexidine in a sterile fashion, and a sterile drape was applied covering the operative field. Maximum barrier sterile technique with sterile gowns and gloves were used for the procedure. A timeout was performed prior to the initiation of the procedure. A micropuncture kit was utilized to access the right internal jugular vein under direct, real-time ultrasound guidance after the overlying soft tissues were anesthetized with 1% lidocaine with epinephrine. Ultrasound image documentation was performed. The microwire was kinked to measure appropriate catheter length. A stiff glidewire was advanced to the level of the IVC. A 16 cm hemodialysis catheter was then placed over the wire. Final catheter positioning was confirmed and documented with a spot radiographic image. The catheter aspirates and flushes normally. The catheter was flushed with appropriate volume heparin dwells. Dressings were applied. The patient tolerated the procedure well without immediate post procedural complication. IMPRESSION: Status post image guided placement of right IJ temporary triple-lumen hemodialysis catheter. Signed, Dulcy Fanny. Dellia Nims, RPVI [facility] Electronically Signed   By: Corrie Mckusick D.O.   On: 05/22/2020 14:12   ECHOCARDIOGRAM COMPLETE  Result Date: 05/22/2020    ECHOCARDIOGRAM REPORT   Patient Name:   Steven Cortez Date of Exam: 05/22/2020 Medical Rec #:  [id number]      Height:       70.0 in Accession #:    [id number]     Weight:       180.3 lb Date of Birth:  [birth year]      BSA:          1.998 m Patient Age:    29 years       BP:           148/80 mmHg Patient Gender: M              HR:           90 bpm. Exam Location:  Inpatient Procedure: 2D Echo, Cardiac Doppler and Color Doppler  Reported to: Dr. Debara Pickett on 05/22/2020 3:20:00 AM      Mass visualized on Tricuspid valve. Indications:    Endocarditis. I38   History:        Patient has no prior history of Echocardiogram examinations.  Sonographer:    Jonelle Sidle Dance Referring Phys: [id number] Raynaldo Opitz RAINES IMPRESSIONS  1. Left ventricular ejection fraction, by estimation, is 60 to 65%. The left ventricle has normal function. The left ventricle has no regional wall motion abnormalities. There is mild left ventricular hypertrophy. Left ventricular diastolic parameters were normal.  2. Right ventricular systolic function is normal. The right ventricular size is  normal.  3. The mitral valve is normal in structure. No evidence of mitral valve regurgitation.  4. Large mobile echogenic mass noted on the RV side of the tricuspid valve septal leaflet, measuring 1 x 3 cm, suggestive of vegetation. The tricuspid valve is abnormal.  5. The aortic valve is tricuspid. Aortic valve regurgitation is not visualized.  6. The inferior vena cava is normal in size with greater than 50% respiratory variability, suggesting right atrial pressure of 3 mmHg. Comparison(s): No prior Echocardiogram. Conclusion(s)/Recommendation(s): Findings demonstrate tricuspid valve mass likley vegetation, would recommend a Transesophageal Echocardiogram for further evaluation. Critical findings reported to Dr. Rebeca Alert and acknowledged at 05/22/2020 at 3:56 pm. FINDINGS  Left Ventricle: Left ventricular ejection fraction, by estimation, is 60 to 65%. The left ventricle has normal function. The left ventricle has no regional wall motion abnormalities. The left ventricular internal cavity size was normal in size. There is  mild left ventricular hypertrophy. Left ventricular diastolic parameters were normal. Right Ventricle: The right ventricular size is normal. No increase in right ventricular wall thickness. Right ventricular systolic function is normal. Left Atrium: Left atrial size was normal in size. Right Atrium: Right atrial size was normal in size. Pericardium: There is no evidence of pericardial effusion.  Mitral Valve: The mitral valve is normal in structure. No evidence of mitral valve regurgitation. Tricuspid Valve: Large mobile echogenic mass noted on the RV side of the tricuspid valve septal leaflet, measuring 1 x 3 cm, suggestive of vegetation. The tricuspid valve is abnormal. Tricuspid valve regurgitation is mild. Aortic Valve: The aortic valve is tricuspid. Aortic valve regurgitation is not visualized. Pulmonic Valve: The pulmonic valve was normal in structure. Pulmonic valve regurgitation is not visualized. Aorta: The aortic root and ascending aorta are structurally normal, with no evidence of dilitation. Venous: The inferior vena cava is normal in size with greater than 50% respiratory variability, suggesting right atrial pressure of 3 mmHg. IAS/Shunts: No atrial level shunt detected by color flow Doppler.  LEFT VENTRICLE PLAX 2D LVIDd:         4.90 cm  Diastology LVIDs:         3.70 cm  LV e' medial:    12.20 cm/s LV PW:         1.30 cm  LV E/e' medial:  8.7 LV IVS:        0.80 cm  LV e' lateral:   14.30 cm/s LVOT diam:     2.00 cm  LV E/e' lateral: 7.4 LV SV:         96 LV SV Index:   48 LVOT Area:     3.14 cm  RIGHT VENTRICLE             IVC RV Basal diam:  2.30 cm     IVC diam: 2.00 cm RV S prime:     17.70 cm/s TAPSE (M-mode): 2.8 cm LEFT ATRIUM             Index       RIGHT ATRIUM           Index LA diam:        4.20 cm 2.10 cm/m  RA Area:     16.50 cm LA Vol (A2C):   71.2 ml 35.64 ml/m RA Volume:   43.30 ml  21.68 ml/m LA Vol (A4C):   61.3 ml 30.69 ml/m LA Biplane Vol: 67.1 ml 33.59 ml/m  AORTIC VALVE LVOT Vmax:   168.00 cm/s LVOT Vmean:  116.000 cm/s  LVOT VTI:    0.306 m  AORTA Ao Root diam: 3.40 cm Ao Asc diam:  3.20 cm MITRAL VALVE MV Area (PHT): 2.91 cm     SHUNTS MV Decel Time: 261 msec     Systemic VTI:  0.31 m MV E velocity: 106.00 cm/s  Systemic Diam: 2.00 cm MV A velocity: 82.00 cm/s MV E/A ratio:  1.29 Lyman Bishop MD Electronically signed by Lyman Bishop MD Signature Date/Time:  05/22/2020/3:59:45 PM    Final    Medications: . sodium chloride    . sodium chloride    . sodium chloride 1,000 mL (05/22/20 0950)  . DAPTOmycin (CUBICIN)  IV 670 mg (05/22/20 2355)   . sodium chloride   Intravenous Once  . calcium carbonate  1 tablet Oral TID  . Chlorhexidine Gluconate Cloth  6 each Topical Q0600  . doxycycline  100 mg Oral Q12H  . furosemide  80 mg Intravenous BID  . sodium bicarbonate  1,300 mg Oral TID  . sodium zirconium cyclosilicate  10 g Oral Daily    Assessment/Recommendations: Thailan Wintermute is a/an 45 y.o. male with a past medical history Hep C and IVDU who present w/ likely endocarditis and ARF with electrolyte abnormalities.  He was recently admitted to [facility] but left AMA and presented 1/27 to [facility]   Severe AKI:  w/ multiple electrolyte abnormalities and mild uremia; nonoliguric. Proceed with dialysis. Differential includes ATN, renal infarction from septic emboli, infection associated GN (bacterial and hepc).  UA with WBC/RBC/protein; UP/C 0.94.  Complements not depressed. CT RH ok from admission.  Renal US showing mild hydro and distended bladder -IR placed temp cath;  Plan 1st HD today 1/29 -In the setting of endocarditis/IVDU I'm not inclined to proceed to renal biopsy at this time but will consider if no improvement and we would be able to immunosuppress him in the future -q8h bladder scan and I/o cath if retaining -Continue to monitor daily Cr, Dose meds for GFR -Monitor Daily I/Os, Daily weight  -Maintain MAP>65 for optimal renal perfusion.  -Avoid nephrotoxic medications including NSAID  Hyperkalemia: 2/2 AKI -Daily lokelma 10 until managed -Renal diet -Dialysis today  Hypocalcemia: 2/2 repletion - Corrects to 8s - Ca carbonate 500 TID for now  Hep C: not treated -Obtain PCR to determine viral load -Consider ID consult   Hyponatremia: 2/2 aki. Will improve with dialysis  Endocarditis/Disseminated infection with epidural abscess:  likely based on history, imaging, and exam -Abx per primary team and ID -Obtain outside records -F/u cultures  PE: abnormal VQ at [facility], had some hemoptysis after anticoagulated and developed thrombocytopenia outpt. -heparin gtt currently  Hypertension: related to volume excess, manage with dialysis  Anemia due to inflammation: transfuse as needed; Hb 7.5 this AM   Jannifer Hick MD 05/23/2020, 1:59 PM  [facility] Pager: [phone]

## 2020-05-24 ENCOUNTER — Encounter (HOSPITAL_COMMUNITY): Payer: Self-pay | Admitting: Neurological Surgery

## 2020-05-24 DIAGNOSIS — G062 Extradural and subdural abscess, unspecified: Secondary | ICD-10-CM | POA: Diagnosis present

## 2020-05-24 LAB — RENAL FUNCTION PANEL
Albumin: 1.8 g/dL — ABNORMAL LOW (ref 3.5–5.0)
Anion gap: 16 — ABNORMAL HIGH (ref 5–15)
BUN: 177 mg/dL — ABNORMAL HIGH (ref 6–20)
CO2: 15 mmol/L — ABNORMAL LOW (ref 22–32)
Calcium: 7.5 mg/dL — ABNORMAL LOW (ref 8.9–10.3)
Chloride: 102 mmol/L (ref 98–111)
Creatinine, Ser: 4.16 mg/dL — ABNORMAL HIGH (ref 0.61–1.24)
GFR, Estimated: 17 mL/min — ABNORMAL LOW (ref >60)
Glucose, Bld: 149 mg/dL — ABNORMAL HIGH (ref 70–99)
Phosphorus: >30 mg/dL — ABNORMAL HIGH (ref 2.5–4.6)
Potassium: 5.2 mmol/L — ABNORMAL HIGH (ref 3.5–5.1)
Sodium: 133 mmol/L — ABNORMAL LOW (ref 135–145)

## 2020-05-24 LAB — CBC
HCT: 22.2 % — ABNORMAL LOW (ref 39.0–52.0)
Hemoglobin: 8 g/dL — ABNORMAL LOW (ref 13.0–17.0)
MCH: 28.1 pg (ref 26.0–34.0)
MCHC: 36 g/dL (ref 30.0–36.0)
MCV: 77.9 fL — ABNORMAL LOW (ref 80.0–100.0)
Platelets: 197 10*3/uL (ref 150–400)
RBC: 2.85 MIL/uL — ABNORMAL LOW (ref 4.22–5.81)
RDW: 15.8 % — ABNORMAL HIGH (ref 11.5–15.5)
WBC: 16.5 10*3/uL — ABNORMAL HIGH (ref 4.0–10.5)
nRBC: 0 % (ref 0.0–0.2)

## 2020-05-24 LAB — HEPATITIS B CORE ANTIBODY, TOTAL: Hep B Core Total Ab: REACTIVE — AB

## 2020-05-24 LAB — GLUCOSE, CAPILLARY: Glucose-Capillary: 112 mg/dL — ABNORMAL HIGH (ref 70–99)

## 2020-05-24 LAB — HEPATITIS C VRS RNA DETECT BY PCR-QUAL: Hepatitis C Vrs RNA by PCR-Qual: NEGATIVE

## 2020-05-24 LAB — HEPATITIS B SURFACE ANTIBODY,QUALITATIVE: Hep B S Ab: REACTIVE — AB

## 2020-05-24 LAB — LACTATE DEHYDROGENASE: LDH: 206 U/L — ABNORMAL HIGH (ref 98–192)

## 2020-05-24 LAB — HEPATITIS B SURFACE ANTIGEN: Hepatitis B Surface Ag: NONREACTIVE

## 2020-05-24 MED ORDER — HEPARIN SODIUM (PORCINE) 5000 UNIT/ML IJ SOLN
5000.0000 [IU] | Freq: Three times a day (TID) | INTRAMUSCULAR | Status: DC
  Administered 2020-05-24 – 2020-06-01 (×24): 5000 [IU] via SUBCUTANEOUS
  Filled 2020-05-24 (×24): qty 1

## 2020-05-24 MED ORDER — GUAIFENESIN-DM 100-10 MG/5ML PO SYRP
5.0000 mL | ORAL_SOLUTION | ORAL | Status: DC | PRN
  Administered 2020-05-24 – 2020-05-25 (×4): 5 mL via ORAL
  Filled 2020-05-24 (×4): qty 5

## 2020-05-24 MED ORDER — SEVELAMER CARBONATE 800 MG PO TABS
1600.0000 mg | ORAL_TABLET | Freq: Three times a day (TID) | ORAL | Status: DC
  Administered 2020-05-24 – 2020-05-27 (×9): 1600 mg via ORAL
  Filled 2020-05-24 (×9): qty 2

## 2020-05-24 MED ORDER — HEPARIN SODIUM (PORCINE) 1000 UNIT/ML IJ SOLN
INTRAMUSCULAR | Status: AC
  Administered 2020-05-24: 1000 [IU] via INTRAVENOUS_CENTRAL
  Filled 2020-05-24: qty 3

## 2020-05-24 MED ORDER — SODIUM ZIRCONIUM CYCLOSILICATE 10 G PO PACK
10.0000 g | PACK | Freq: Once | ORAL | Status: DC
  Filled 2020-05-24: qty 1

## 2020-05-24 NOTE — Progress Notes (Signed)
Neurosurgery Service Progress Note  Subjective: No acute events overnight, LBP and BLE symptoms dramatically improved, he was sitting up in a chair this morning, looks / feels like a new man   Objective: Vitals:   05/23/20 1600 05/23/20 1954 05/23/20 2328 05/24/20 0515  BP: (!) 159/91 (!) 180/99 (!) 150/93 (!) 161/77  Pulse: 82 77  73  Resp: (!) 22 20 18 20   Temp: 97.7 F (36.5 C) 97.8 F (36.6 C) 97.7 F (36.5 C) 97.8 F (36.6 C)  TempSrc: Oral Oral Oral Oral  SpO2: 99% 98% 98% 99%  Weight:    84 kg  Height:        Physical Exam: BUE 5/5 BLE 4- to 4/5 with normal sensation   Assessment & Plan: 45 y.o. man w/ h/o IVDU and lumbar epidural abscess s/p MIS lami and evacuation, recovering well.  -activity as tolerated, no brace needed -okay for DVT chemoPPx 1/31  2/31 Carman Auxier  05/24/20 8:03 AM

## 2020-05-24 NOTE — Evaluation (Addendum)
Occupational Therapy Evaluation Patient Details Name: Steven Cortez MRN: [id number] DOB: [birth year] Today's Date: 05/24/2020    History of Present Illness Pt is a 45 year old male with PMHx including Hep C and IVDU who presented with back pain, weakness, dyspnea, pleuritic chest pain, urinary retention, and pitting edema. 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   Clinical Impression   This 45 y/o male presents with the above. PTA pt reports being independent with ADL, iADL and functional mobility, working in roofing. Pt supine in bed upon arrival, agreeable to participate in therapy session. Overall pt requiring minA for functional transfers and room level mobility using RW. Pt with increased shakiness noted in bil UEs/LEs when fatigued, but anticipate steady progress as he continues to mobilize. He requires up to [facility] for LB ADL at this time, supervision for seated UB ADL. Pt lives with his brother and reports is available intermittently to assist at time of discharge. Pt will benefit from continued acute OT services to maximize his overall safety and independence with ADL and mobility prior to return home. Do not anticipate he will require follow up OT services after discharge.     Follow Up Recommendations  No OT follow up;Supervision - Intermittent    Equipment Recommendations  3 in 1 bedside commode (may progress to no DME needs)           Precautions / Restrictions Precautions Precautions: Fall Precaution Comments: utilized back precautions for comfort Restrictions Weight Bearing Restrictions: No      Mobility Bed Mobility Overal bed mobility: Needs Assistance Bed Mobility: Rolling;Sidelying  to Sit Rolling: Min guard Sidelying to sit: Min assist       General bed mobility comments: light assist to support trunk upright. VCs for implementing log roll technique for pain management, pt requiring increased time/effort for mobility completion    Transfers Overall transfer level: Needs assistance Equipment used: Rolling walker (2 wheeled) Transfers: Sit to/from Stand Sit to Stand: Min assist         General transfer comment: steadying assist once upright, VCs for safe hand placement.    Balance Overall balance assessment: Needs assistance Sitting-balance support: Feet supported Sitting balance-Leahy Scale: Good     Standing balance support: Bilateral upper extremity supported Standing balance-Leahy Scale: Fair Standing balance comment: reliant on UE support for pain control and given LE weakness/shakiness with mobility; pt observed picking his RW up at one point when repositioning and with no LOB noted                           ADL either performed or assessed with clinical judgement   ADL Overall ADL's : Needs assistance/impaired Eating/Feeding: Independent;Sitting   Grooming: Supervision/safety;Sitting   Upper Body Bathing: Supervision/ safety;Sitting   Lower Body Bathing: Moderate assistance;Sit to/from stand   Upper Body Dressing : Supervision/safety;Sitting   Lower Body Dressing: Moderate assistance;Sit to/from stand   Toilet Transfer: Minimal assistance;Ambulation;RW Toilet Transfer Details (indicate cue type and reason): simulated via transfer EOB to recliner, room level mobility (from EOB to sink and back to recliner) Toileting- Clothing Manipulation and Hygiene: Minimal assistance;Sit to/from stand       Functional mobility during ADLs: Minimal assistance;Rolling walker  Pertinent Vitals/Pain Pain Assessment: Faces Faces Pain Scale: Hurts even more Pain Location: catheter site/groin, low back Pain  Descriptors / Indicators: Discomfort;Sore Pain Intervention(s): Monitored during session;Repositioned;Other (comment) (RN made aware of discomfort at catheter site)     Hand Dominance     Extremity/Trunk Assessment Upper Extremity Assessment Upper Extremity Assessment: Generalized weakness (gross weakness - shakiness noted in bil UEs with increased time upright and using RW)   Lower Extremity Assessment Lower Extremity Assessment: Defer to PT evaluation       Communication Communication Communication: No difficulties   Cognition Arousal/Alertness: Awake/alert Behavior During Therapy: WFL for tasks assessed/performed Overall Cognitive Status: Within Functional Limits for tasks assessed                                     General Comments  RN in end of session to address foley catheter    Exercises     Shoulder Instructions      Home Living Family/patient expects to be discharged to:: Private residence Living Arrangements: Other relatives (brother) Available Help at Discharge: Family;Available PRN/intermittently Type of Home: Apartment Home Access: Level entry     Home Layout: One level     Bathroom Shower/Tub: Chief Strategy Officer: Standard     Home Equipment: None          Prior Functioning/Environment Level of Independence: Independent        Comments: works in Land Problem List: Decreased strength;Decreased activity tolerance;Impaired balance (sitting and/or standing);Decreased knowledge of use of DME or AE;Decreased knowledge of precautions;Pain      OT Treatment/Interventions: Self-care/ADL training;Therapeutic exercise;Energy conservation;DME and/or AE instruction;Therapeutic activities;Patient/family education;Balance training    OT Goals(Current goals can be found in the care plan section) Acute Rehab OT Goals Patient Stated Goal: home, back to independent OT Goal Formulation: With patient Time For  Goal Achievement: 06/07/20 Potential to Achieve Goals: Good  OT Frequency: Min 2X/week   Barriers to D/C:            Co-evaluation              AM-PAC OT "6 Clicks" Daily Activity     Outcome Measure Help from another person eating meals?: None Help from another person taking care of personal grooming?: A Little Help from another person toileting, which includes using toliet, bedpan, or urinal?: A Little Help from another person bathing (including washing, rinsing, drying)?: A Little Help from another person to put on and taking off regular upper body clothing?: None Help from another person to put on and taking off regular lower body clothing?: A Little 6 Click Score: 20   End of Session Equipment Utilized During Treatment: Gait belt;Rolling walker Nurse Communication: Mobility status  Activity Tolerance: Patient tolerated treatment well Patient left: in chair;with call bell/phone within reach;with nursing/sitter in room  OT Visit Diagnosis: Other abnormalities of gait and mobility (R26.89);Muscle weakness (generalized) (M62.81);Pain Pain - part of body:  (back, groin)                Time: 1610-9604 OT Time Calculation (min): 22 min Charges:  OT General Charges $OT Visit: 1 Visit OT Evaluation $OT Eval Moderate Complexity: 1 Mod  Marcy Siren, OT Acute Rehabilitation Services Pager [phone] Office [phone]   Orlando Penner 05/24/2020, 11:13 AM

## 2020-05-24 NOTE — Progress Notes (Signed)
[name] Progress Note   Subjective:   Seen at bedside. Extensive epidural abscess noted on MRI s/p OR yesterday AM - not can ambulate.  He has no new concerns for me.  +dysgeusia. Plans for HD today. UOP 2.2L    Objective Vitals:   05/23/20 1954 05/23/20 2328 05/24/20 0515 05/24/20 0804  BP: (!) 180/99 (!) 150/93 (!) 161/77 (!) 176/95  Pulse: 77  73 (!) 41  Resp: 20 18 20  (!) 23  Temp: 97.8 F (36.6 C) 97.7 F (36.5 C) 97.8 F (36.6 C) (!) 97.5 F (36.4 C)  TempSrc: Oral Oral Oral Oral  SpO2: 98% 98% 99% 99%  Weight:   84 kg   Height:       Physical Exam General: comfortable in chair Heart: hyperdynamic, no rub Lungs: clear ant, basilar rales R > L Abdomen: soft, nontender Extremities: 2+ edema to knees Skin: no janeway lesions, splinter hemorrhages; no petechiae; R calf with traumatic scab with some surrounding erythema that doesn't look infected Dialysis Access:  RIJ temp HD catheter  Additional Objective Labs: Basic Metabolic Panel: Recent Labs  Lab 05/21/20 1850 05/22/20 0226 05/22/20 2118 05/23/20 0650 05/24/20 0203  NA 127*   < > 130* 134* 133*  K 5.8*   < > 5.3* 4.9 5.2*  CL 98   < > 100 104 102  CO2 12*   < > 13* 15* 15*  GLUCOSE 147*   < > 109* 92 149*  BUN 184*   < > 171* 165* 177*  CREATININE 6.56*   < > 4.81* 4.11* 4.16*  CALCIUM 6.7*   < > 6.5* 6.9* 7.5*  PHOS >30.0*  --   --  11.9* >30.0*   < > = values in this interval not displayed.   Liver Function Tests: Recent Labs  Lab 05/21/20 0831 05/21/20 1850 05/22/20 0226 05/24/20 0203  AST 30  --  39  --   ALT 38  --  37  --   ALKPHOS 297*  --  255*  --   BILITOT 1.1  --  1.2  --   PROT 6.1*  --  5.9*  --   ALBUMIN 1.7* 1.5* 1.7* 1.8*   No results for input(s): LIPASE, AMYLASE in the last 168 hours. CBC: Recent Labs  Lab 05/21/20 0831 05/21/20 1011 05/21/20 1850 05/22/20 0226 05/22/20 2118 05/23/20 0423 05/24/20 0203  WBC 18.9*  --  15.9* 14.3*  --  18.1*  16.5*  NEUTROABS  --   --  14.0*  --   --   --   --   HGB 8.1*   < > 7.3* 6.7* 7.4* 7.5* 8.0*  HCT 23.7*   < > 21.8* 18.3* 20.8* 21.6* 22.2*  MCV 79.0*  --  78.4* 75.3*  --  78.8* 77.9*  PLT 170  --  154 160  --  188 197   < > = values in this interval not displayed.   Blood Culture    Component Value Date/Time   SDES ABSCESS 05/23/2020 0907   SPECREQUEST LUMBAR EPIDURAL SPEC A 05/23/2020 0907   CULT PENDING 05/23/2020 0907   REPTSTATUS PENDING 05/23/2020 0907    Cardiac Enzymes: Recent Labs  Lab 05/22/20 2118  CKTOTAL 103   CBG: No results for input(s): GLUCAP in the last 168 hours. Iron Studies:  Recent Labs    05/21/20 1850 05/22/20 0226  IRON 40*  --   TIBC 189*  --   FERRITIN  --  343*   @  lablastinr3@ Studies/Results: MR THORACIC SPINE WO CONTRAST  Result Date: 05/23/2020 CLINICAL DATA:  Endocarditis with urinary retention and back pain. Evaluate for epidural abscess. On hemodialysis. EXAM: MRI THORACIC AND LUMBAR SPINE WITHOUT CONTRAST TECHNIQUE: Multiplanar and multiecho pulse sequences of the thoracic and lumbar spine were obtained without intravenous contrast. COMPARISON:  Abdomen and pelvis CT 05/18/2020 FINDINGS: MRI THORACIC SPINE FINDINGS Alignment:  Physiologic Vertebrae: No fracture, evidence of discitis, or bone lesion. Cord: Normal signal and morphology. Dorsal fluid collection beginning at the level of T11-12 and contining into the lumbar spine, as described below. Paraspinal and other soft tissues: Cavitary/septic pneumonia bilaterally with small pleural effusions the overall pattern is similar to prior chest CT. Disc levels: Disc narrowing and small protrusions at T7-8 to T10-11. No degenerative neural compression. MRI LUMBAR SPINE FINDINGS Segmentation: Five lumbar type vertebrae. Localization between the thoracic and lumbar MRIs is misregistered but 12 thoracic vertebrae and 5 lumbar type vertebrae are confirmed on chest and abdomen CT comparison.  Alignment:  Physiologic Vertebrae: Marrow edema on both sides of the L4-5 and L5-S1 disc spaces. Marrow edema about the right more than left L4-5 and left more than right L5-S1 facets. Disc space T2 hyperintensity at L4-5 and L5-S1 with contiguous ventral fluid collection. Conus medullaris and cauda equina: Conus extends to the T12 level. Dominant ventral fluid collection spanning T11-12 to S2, with diffuse compression of the thecal sac. At the level of L4 and L5 there is even thicker and more circumferential fluid collection with marked compression of the nerve roots. Paraspinal and other soft tissues: Body wall edema. Disc levels: The purulent disc spaces are narrowed and bulging. L3-4 disc space narrowing and mild bulging. These results were called by telephone at the time of interpretation on 05/23/2020 at 5:11 am to provider Dr Austin Miles, who verbally acknowledged these results. 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 Epidural abscess compresses the thecal sac from T11-12 to the level of S2. Electronically Signed   By: Marnee Spring M.D.   On: 05/23/2020 05:11   MR LUMBAR SPINE WO CONTRAST  Result Date: 05/23/2020 CLINICAL DATA:  Endocarditis with urinary retention and back pain. Evaluate for epidural abscess. On hemodialysis. EXAM: MRI THORACIC AND LUMBAR SPINE WITHOUT CONTRAST TECHNIQUE: Multiplanar and multiecho pulse sequences of the thoracic and lumbar spine were obtained without intravenous contrast. COMPARISON:  Abdomen and pelvis CT 05/18/2020 FINDINGS: MRI THORACIC SPINE FINDINGS Alignment:  Physiologic Vertebrae: No fracture, evidence of discitis, or bone lesion. Cord: Normal signal and morphology. Dorsal fluid collection beginning at the level of T11-12 and contining into the lumbar spine, as described below. Paraspinal and other soft tissues: Cavitary/septic pneumonia bilaterally with small pleural effusions the overall pattern is similar to prior chest  CT. Disc levels: Disc narrowing and small protrusions at T7-8 to T10-11. No degenerative neural compression. MRI LUMBAR SPINE FINDINGS Segmentation: Five lumbar type vertebrae. Localization between the thoracic and lumbar MRIs is misregistered but 12 thoracic vertebrae and 5 lumbar type vertebrae are confirmed on chest and abdomen CT comparison. Alignment:  Physiologic Vertebrae: Marrow edema on both sides of the L4-5 and L5-S1 disc spaces. Marrow edema about the right more than left L4-5 and left more than right L5-S1 facets. Disc space T2 hyperintensity at L4-5 and L5-S1 with contiguous ventral fluid collection. Conus medullaris and cauda equina: Conus extends to the T12 level. Dominant ventral fluid collection spanning T11-12 to S2, with diffuse compression of the thecal sac. At the level of L4 and  L5 there is even thicker and more circumferential fluid collection with marked compression of the nerve roots. Paraspinal and other soft tissues: Body wall edema. Disc levels: The purulent disc spaces are narrowed and bulging. L3-4 disc space narrowing and mild bulging. These results were called by telephone at the time of interpretation on 05/23/2020 at 5:11 am to provider Dr Austin MilesJinwala, who verbally acknowledged these results. 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 Epidural abscess compresses the thecal sac from T11-12 to the level of S2. Electronically Signed   By: Marnee SpringJonathon  Watts M.D.   On: 05/23/2020 05:11   ECHOCARDIOGRAM COMPLETE  Result Date: 05/22/2020    ECHOCARDIOGRAM REPORT   Patient Name:   Steven CharsDEREK Oplinger Date of Exam: 05/22/2020 Medical Rec #:  [id number]      Height:       70.0 in Accession #:    [id number]     Weight:       180.3 lb Date of Birth:  08-Aug-1975      BSA:          1.998 m Patient Age:    44 years       BP:           148/80 mmHg Patient Gender: M              HR:           90 bpm. Exam Location:  Inpatient Procedure: 2D Echo, Cardiac Doppler and Color  Doppler  Reported to: Dr. Rennis GoldenHilty on 05/22/2020 3:20:00 AM      Mass visualized on Tricuspid valve. Indications:    Endocarditis. I38  History:        Patient has no prior history of Echocardiogram examinations.  Sonographer:    Elmarie Shileyiffany Dance Referring Phys: [id number] Elwin MochaALEXANDER N RAINES IMPRESSIONS  1. Left ventricular ejection fraction, by estimation, is 60 to 65%. The left ventricle has normal function. The left ventricle has no regional wall motion abnormalities. There is mild left ventricular hypertrophy. Left ventricular diastolic parameters were normal.  2. Right ventricular systolic function is normal. The right ventricular size is normal.  3. The mitral valve is normal in structure. No evidence of mitral valve regurgitation.  4. Large mobile echogenic mass noted on the RV side of the tricuspid valve septal leaflet, measuring 1 x 3 cm, suggestive of vegetation. The tricuspid valve is abnormal.  5. The aortic valve is tricuspid. Aortic valve regurgitation is not visualized.  6. The inferior vena cava is normal in size with greater than 50% respiratory variability, suggesting right atrial pressure of 3 mmHg. Comparison(s): No prior Echocardiogram. Conclusion(s)/Recommendation(s): Findings demonstrate tricuspid valve mass likley vegetation, would recommend a Transesophageal Echocardiogram for further evaluation. Critical findings reported to Dr. Sandre Kittyaines and acknowledged at 05/22/2020 at 3:56 pm. FINDINGS  Left Ventricle: Left ventricular ejection fraction, by estimation, is 60 to 65%. The left ventricle has normal function. The left ventricle has no regional wall motion abnormalities. The left ventricular internal cavity size was normal in size. There is  mild left ventricular hypertrophy. Left ventricular diastolic parameters were normal. Right Ventricle: The right ventricular size is normal. No increase in right ventricular wall thickness. Right ventricular systolic function is normal. Left Atrium: Left atrial size  was normal in size. Right Atrium: Right atrial size was normal in size. Pericardium: There is no evidence of pericardial effusion. Mitral Valve: The mitral valve is normal in structure. No evidence of mitral valve regurgitation. Tricuspid Valve: Large mobile echogenic  mass noted on the RV side of the tricuspid valve septal leaflet, measuring 1 x 3 cm, suggestive of vegetation. The tricuspid valve is abnormal. Tricuspid valve regurgitation is mild. Aortic Valve: The aortic valve is tricuspid. Aortic valve regurgitation is not visualized. Pulmonic Valve: The pulmonic valve was normal in structure. Pulmonic valve regurgitation is not visualized. Aorta: The aortic root and ascending aorta are structurally normal, with no evidence of dilitation. Venous: The inferior vena cava is normal in size with greater than 50% respiratory variability, suggesting right atrial pressure of 3 mmHg. IAS/Shunts: No atrial level shunt detected by color flow Doppler.  LEFT VENTRICLE PLAX 2D LVIDd:         4.90 cm  Diastology LVIDs:         3.70 cm  LV e' medial:    12.20 cm/s LV PW:         1.30 cm  LV E/e' medial:  8.7 LV IVS:        0.80 cm  LV e' lateral:   14.30 cm/s LVOT diam:     2.00 cm  LV E/e' lateral: 7.4 LV SV:         96 LV SV Index:   48 LVOT Area:     3.14 cm  RIGHT VENTRICLE             IVC RV Basal diam:  2.30 cm     IVC diam: 2.00 cm RV S prime:     17.70 cm/s TAPSE (M-mode): 2.8 cm LEFT ATRIUM             Index       RIGHT ATRIUM           Index LA diam:        4.20 cm 2.10 cm/m  RA Area:     16.50 cm LA Vol (A2C):   71.2 ml 35.64 ml/m RA Volume:   43.30 ml  21.68 ml/m LA Vol (A4C):   61.3 ml 30.69 ml/m LA Biplane Vol: 67.1 ml 33.59 ml/m  AORTIC VALVE LVOT Vmax:   168.00 cm/s LVOT Vmean:  116.000 cm/s LVOT VTI:    0.306 m  AORTA Ao Root diam: 3.40 cm Ao Asc diam:  3.20 cm MITRAL VALVE MV Area (PHT): 2.91 cm     SHUNTS MV Decel Time: 261 msec     Systemic VTI:  0.31 m MV E velocity: 106.00 cm/s  Systemic Diam: 2.00  cm MV A velocity: 82.00 cm/s MV E/A ratio:  1.29 Zoila Shutter MD Electronically signed by Zoila Shutter MD Signature Date/Time: 05/22/2020/3:59:45 PM    Final    Medications: . sodium chloride    . sodium chloride    . sodium chloride 1,000 mL (05/22/20 0950)  . DAPTOmycin (CUBICIN)  IV 670 mg (05/22/20 2355)   . sodium chloride   Intravenous Once  . calcium carbonate  1 tablet Oral TID  . Chlorhexidine Gluconate Cloth  6 each Topical Q0600  . Chlorhexidine Gluconate Cloth  6 each Topical Q0600  . doxycycline  100 mg Oral Q12H  . furosemide  80 mg Intravenous BID  . heparin  5,000 Units Subcutaneous Q8H  . mupirocin ointment  1 application Nasal BID  . sevelamer carbonate  800 mg Oral TID WC  . sodium bicarbonate  1,300 mg Oral TID  . sodium zirconium cyclosilicate  10 g Oral Once    Assessment/Recommendations: Steven Cortez is a/an 45 y.o. male with a past medical history Hep C  and IVDU who present w/ likely endocarditis and ARF with electrolyte abnormalities.  He was recently admitted to [facility] but left AMA and presented 1/27 to [facility].   Severe AKI:  w/ multiple electrolyte abnormalities and mild uremia; nonoliguric. Proceed with dialysis. Differential includes ATN, renal infarction from septic emboli but not on imaging, infection associated GN (bacterial and hepc).  UA with WBC/RBC/protein; UP/C 0.94 - not consistent with nephrotic syndrome.  Complements not depressed. CT RH ok from admission.  Renal US showing mild hydro and distended bladder -IR placed temp cath;  Plan 1st HD today 1/30 -In the setting of endocarditis/IVDU I'm not inclined to proceed to renal biopsy at this time but will consider if no improvement and we would be able to immunosuppress him in the future -Foley catheter for now, TOV soon s/p epidural abscess decompressed  -Continue to monitor daily Cr, Dose meds for GFR -Monitor Daily I/Os, Daily weight  -Maintain MAP>65 for optimal renal perfusion.  -Avoid nephrotoxic  medications including NSAID  Hyperkalemia: 2/2 AKI -Daily lokelma 10 until managed -Renal diet -Dialysis today  Hyperphos:  Phos 11.9 yesterday, measured at 30 today - suspect spurious -Started binder -Staring HD  Metabolic acidosis: bicarb 15, cont oral bicarb for now, can stop if cont to require HD or resolves  Hypocalcemia: - Corrects to 8s - Ca carbonate 500 TID for now  Hep C: not treated -Obtain PCR to determine viral load -Consider ID consult   Hyponatremia: Mild 2/2 aki. Will improve with dialysis  Endocarditis/Disseminated infection with epidural abscess: likely based on history, imaging, and exam -Abx per primary team and ID -Obtain outside records -F/u cultures  PE: abnormal VQ at [facility], had some hemoptysis after anticoagulated and developed thrombocytopenia outpt. -heparin gtt currently  Hypertension: related to volume excess, manage with dialysis  Anemia due to inflammation: transfuse as needed; Hb 8 this AM   Estill Bakes MD 05/24/2020, 10:17 AM  [facility] Pager: [phone]

## 2020-05-24 NOTE — Progress Notes (Signed)
Subjective:   His back is feeling better since having surgery. He is still feeling weak in his legs and is unsure if this has changed. He is breathing feels okay but is fast, he does not have any palpitations. No O2 requirement.  Objective:  Vital signs in last 24 hours: Vitals:   05/23/20 1600 05/23/20 1954 05/23/20 2328 05/24/20 0515  BP: (!) 159/91 (!) 180/99 (!) 150/93 (!) 161/77  Pulse: 82 77  73  Resp: (!) 22 20 18 20   Temp: 97.7 F (36.5 C) 97.8 F (36.6 C) 97.7 F (36.5 C) 97.8 F (36.6 C)  TempSrc: Oral Oral Oral Oral  SpO2: 99% 98% 98% 99%  Weight:    84 kg  Height:        Physical Exam Constitutional: no acute distress, energy and attention appear improved compared to prior Head: atraumatic ENT: external ears normal Eyes: EOMI, PERRL Cardiovascular: regular rate and rhythm, 1/6 systolic murmur is softer than prior day Pulmonary: effort normal, lungs clear to ascultation bilaterally Abdominal: flat, nontender, no rebound tenderness, bowel sounds normal Skin: warm and dry Neurological: alert, LE with 4/5 strength bilaterally though difficult to assess due to pain Psychiatric: normal mood and affect  Assessment/Plan: Steven Cortez is a 45 y.o. male with hx of IV drug use and untreated Hep C who was diagnosed with endocarditis and MRSA bacteremia at [facility], left AMA then presented to [facility] a few days later. Also found to have large epidural abscess, neurosurgery performed procedure on 1/29. Symptoms and leukocytosis are improving with antibiotics.  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 Repeat TTE here shows 1 x 3 cm vegetation on the septal leaflet of the tricuspid valve, blood cultures here no growth to date. Fortunately, LVEF is preserved and TR is only mild at this time. CT surgery consulted for evaluation and possible angiovac. 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 POD 1 from laminectomy and drainage of epidural abscess.   -Appreciate Neurosurgery  -Activity as tolerated  -okay for DVT chemoprophylaxis 1/31, okay for heparin during HD today if needed -Appreciate ID recs:  -daptomycin day 3  -doxycycline day 3 -Consulted CT surg, f/u recs -f/u blood cultures from admission (no growth thus far)    Acute renal failure  hyperkalemia  elevated AGMA Hyponatremia - improved Hypocalcemia Creatomome 4.16 < 4.11, 7.23 on admission. Was improving progressively, may have had bump today due to surgery yesterday. K 5.2 this morning. Anuria on admission, Foley in place now with good UOP.  Pyuria and hematuria present on urinalysis. Setting bacteremia.Likely multifactorial, possibly some contribution of acute obstruction.Renal u/s significant for bladder distension and mild bilateral hydronephrosis. Temp HD catheter placed. C3 low at 60, normal C4, but nephro holding off on biopsy as he would not be a candidate for immunosuppression.  -Appreciate nephrology recommendations  -planning for HD today, unfortunately unable to do yesterday due to staffing  -Lokelma 10g daily  -Ca carbonate 500 TID, correct to 8s  -Sodium Bicarbonate TID -Keep Foley in place at least 7 days, Foley care daily per  urology due to phimosis -f/u hep c   Hypervolemia Likely due to combination of acute renal failure and possible new onset CHF. He does have visible JVP, tricuspid regurgitation could be contributing.  TTEreassuring with preserved LVEF, normal CVP estimation by IVC. Renal  functioning improving with auto diuresis.  -Continue Monitoring UOP -IV Lasix 80 mg BID per nephro -HD as above -No antihypertensives at this time out of consideration of renal perfusion   Microcytic anemialikely 2/2 Chronic Disease Acute Anemia Hypo-proliferative per corrected retic count. Elevated ferritin, low iron and TIBC. Drop in hemoglobin to 6.7 overnight.Etiology of anemia likely multifactorial with prime component of anemia of chronic inflammation. Hgb stable at 7.5 following 1 unit pRBCs on 1/28 -Monitor CBC   Hep C Patient with hx of IV drug use. Reports hx of untreated Hep C. -HCVPCR pending  Diet:  Renal carb modified IVF:  none VTE:  heparin Prior to Admission Living Arrangement:  home Anticipated Discharge Location:  home Barriers to Discharge:  Management of endocarditis and complications Dispo: Anticipated discharge pending medical management   Remo Lipps, MD 05/24/2020, 6:29 AM Pager: [phone] After 5pm on weekdays and 1pm on weekends: On Call pager [phone]

## 2020-05-25 DIAGNOSIS — Z9889 Other specified postprocedural states: Secondary | ICD-10-CM

## 2020-05-25 DIAGNOSIS — G062 Extradural and subdural abscess, unspecified: Secondary | ICD-10-CM

## 2020-05-25 LAB — CBC
HCT: 22.2 % — ABNORMAL LOW (ref 39.0–52.0)
Hemoglobin: 7.5 g/dL — ABNORMAL LOW (ref 13.0–17.0)
MCH: 27.4 pg (ref 26.0–34.0)
MCHC: 33.8 g/dL (ref 30.0–36.0)
MCV: 81 fL (ref 80.0–100.0)
Platelets: 166 10*3/uL (ref 150–400)
RBC: 2.74 MIL/uL — ABNORMAL LOW (ref 4.22–5.81)
RDW: 15.9 % — ABNORMAL HIGH (ref 11.5–15.5)
WBC: 18 10*3/uL — ABNORMAL HIGH (ref 4.0–10.5)
nRBC: 0 % (ref 0.0–0.2)

## 2020-05-25 LAB — RENAL FUNCTION PANEL
Albumin: 1.7 g/dL — ABNORMAL LOW (ref 3.5–5.0)
Anion gap: 14 (ref 5–15)
BUN: 136 mg/dL — ABNORMAL HIGH (ref 6–20)
CO2: 21 mmol/L — ABNORMAL LOW (ref 22–32)
Calcium: 7.8 mg/dL — ABNORMAL LOW (ref 8.9–10.3)
Chloride: 101 mmol/L (ref 98–111)
Creatinine, Ser: 2.85 mg/dL — ABNORMAL HIGH (ref 0.61–1.24)
GFR, Estimated: 27 mL/min — ABNORMAL LOW (ref >60)
Glucose, Bld: 110 mg/dL — ABNORMAL HIGH (ref 70–99)
Phosphorus: 9.2 mg/dL — ABNORMAL HIGH (ref 2.5–4.6)
Potassium: 4.6 mmol/L (ref 3.5–5.1)
Sodium: 136 mmol/L (ref 135–145)

## 2020-05-25 LAB — HAPTOGLOBIN: Haptoglobin: 271 mg/dL (ref 23–355)

## 2020-05-25 MED ORDER — OXYCODONE HCL 5 MG PO TABS
5.0000 mg | ORAL_TABLET | ORAL | Status: DC | PRN
  Administered 2020-05-25 – 2020-05-29 (×17): 5 mg via ORAL
  Filled 2020-05-25 (×17): qty 1

## 2020-05-25 MED ORDER — DARBEPOETIN ALFA 150 MCG/0.3ML IJ SOSY
150.0000 ug | PREFILLED_SYRINGE | INTRAMUSCULAR | Status: DC
  Administered 2020-05-26: 150 ug via INTRAVENOUS
  Filled 2020-05-25: qty 0.3

## 2020-05-25 MED ORDER — SODIUM CHLORIDE 0.9 % IV SOLN
670.0000 mg | Freq: Every day | INTRAVENOUS | Status: DC
  Administered 2020-05-25: 670 mg via INTRAVENOUS
  Filled 2020-05-25 (×2): qty 13.4

## 2020-05-25 NOTE — Evaluation (Signed)
Physical Therapy Evaluation Patient Details Name: Steven Cortez MRN: [id number] DOB: 1975/12/08 Today's Date: 05/25/2020   History of Present Illness  Pt is a 45 year old male with PMHx including Hep C and IVDU who presented with back pain, weakness, dyspnea, pleuritic chest pain, urinary retention, and pitting edema. He was recently admitted to [facility] and found to have tricuspid endocarditis with MRSA bacteremia, and pulmonary septic emboli but left AMA. 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  Clinical Impression  Prior to admission, pt lives with his brother and works in [employer]. Pt reporting fatigue, but agreeable to limited physical therapy evaluation with encouragement. Pt ambulating 30 feet with a walker at a supervision level. HR stable in 90's. Suspect steady progress. Will continue to follow acutely to promote mobility. No PT follow up anticipated.     Follow Up Recommendations No PT follow up    Equipment Recommendations  Rolling walker with 5" wheels    Recommendations for Other Services       Precautions / Restrictions Precautions Precautions: Fall Precaution Comments: utilized back precautions for comfort Restrictions Weight Bearing Restrictions: No      Mobility  Bed Mobility Overal bed mobility: Modified Independent             General bed mobility comments: HOB slightly elevated    Transfers Overall transfer level: Needs assistance Equipment used: Rolling walker (2 wheeled) Transfers: Sit to/from Stand Sit to Stand: Supervision         General transfer comment: Cues for hand placement  Ambulation/Gait Ambulation/Gait assistance: Supervision Gait Distance (Feet): 30 Feet Assistive device: Rolling walker (2 wheeled) Gait Pattern/deviations: Step-through pattern;Decreased stride length Gait velocity:  decreased   General Gait Details: Cues for walker use, supervision for safety  Stairs            Wheelchair Mobility    Modified Rankin (Stroke Patients Only)       Balance Overall balance assessment: Needs assistance Sitting-balance support: Feet supported Sitting balance-Leahy Scale: Good     Standing balance support: Bilateral upper extremity supported Standing balance-Leahy Scale: Fair                               Pertinent Vitals/Pain Pain Assessment: 0-10 Pain Score: 7  Pain Location: L sided chest pain (pt reports has been present several days) Pain Descriptors / Indicators: Constant Pain Intervention(s): Monitored during session;Other (comment) (RN notified)    Home Living Family/patient expects to be discharged to:: Private residence Living Arrangements: Other relatives (brother) Available Help at Discharge: Family;Available PRN/intermittently Type of Home: Apartment Home Access: Level entry     Home Layout: One level Home Equipment: None      Prior Function Level of Independence: Independent         Comments: works in [employer]        Extremity/Trunk Assessment   Upper Extremity Assessment Upper Extremity Assessment: Defer to OT evaluation    Lower Extremity Assessment Lower Extremity Assessment: RLE deficits/detail;LLE deficits/detail RLE Deficits / Details: Strength 5/5 LLE Deficits / Details: Strength 5/5       Communication   Communication: No difficulties  Cognition Arousal/Alertness: Awake/alert Behavior During Therapy: WFL for tasks assessed/performed Overall Cognitive Status: Within Functional Limits for tasks assessed  General Comments      Exercises     Assessment/Plan    PT Assessment Patient needs continued PT services  PT Problem List Decreased activity tolerance;Decreased balance;Decreased mobility;Pain       PT  Treatment Interventions DME instruction;Gait training;Functional mobility training;Therapeutic activities;Therapeutic exercise;Balance training;Patient/family education;Stair training    PT Goals (Current goals can be found in the Care Plan section)  Acute Rehab PT Goals Patient Stated Goal: home, back to independent PT Goal Formulation: With patient Time For Goal Achievement: 06/08/20 Potential to Achieve Goals: Good    Frequency Min 3X/week   Barriers to discharge        Co-evaluation               AM-PAC PT "6 Clicks" Mobility  Outcome Measure Help needed turning from your back to your side while in a flat bed without using bedrails?: None Help needed moving from lying on your back to sitting on the side of a flat bed without using bedrails?: None Help needed moving to and from a bed to a chair (including a wheelchair)?: None Help needed standing up from a chair using your arms (e.g., wheelchair or bedside chair)?: None Help needed to walk in hospital room?: None Help needed climbing 3-5 steps with a railing? : A Little 6 Click Score: 23    End of Session   Activity Tolerance: Patient limited by fatigue Patient left: in bed;with call bell/phone within reach Nurse Communication: Mobility status;Other (comment) (chest pain) PT Visit Diagnosis: Unsteadiness on feet (R26.81);Pain Pain - part of body:  (back)    Time: 8466-5993 PT Time Calculation (min) (ACUTE ONLY): 15 min   Charges:   PT Evaluation $PT Eval Moderate Complexity: 1 Mod          Lillia Pauls, PT, DPT Acute Rehabilitation Services Pager [phone] Office [phone]   Norval Morton 05/25/2020, 8:57 AM

## 2020-05-25 NOTE — Progress Notes (Addendum)
Subjective:  Patient reports better. Was able to ambulate with PT. Pain largely well controlled with current pain regimen, though still having some pain. Legs feel lighter.   Objective:  Vital signs in last 24 hours: Vitals:   05/24/20 1608 05/24/20 1951 05/25/20 0047 05/25/20 0421  BP: (!) 167/96 (!) 160/89 (!) 172/100 (!) 152/78  Pulse: 88 80 88 82  Resp: (!) 22 18 18 18   Temp:  98.3 F (36.8 C) 98.4 F (36.9 C) 98.2 F (36.8 C)  TempSrc:  Oral Oral Oral  SpO2: 99% 97% 96% 98%  Weight:    80.8 kg  Height:       Weight change: -1.7 kg  Intake/Output Summary (Last 24 hours) at 05/25/2020 1257 Last data filed at 05/25/2020 1017 Gross per 24 hour  Intake 603 ml  Output 2675 ml  Net -2072 ml   Physical Exam Constitutional:      General: He is not in acute distress.    Appearance: He is not toxic-appearing or diaphoretic.     Comments: Patient seen lying in bed, NAD   Cardiovascular:     Rate and Rhythm: Normal rate and regular rhythm.  Pulmonary:     Effort: Pulmonary effort is normal.  Abdominal:     Palpations: Abdomen is soft.     Tenderness: Tenderness: mild tenderness to palpation of RUQ.  Musculoskeletal:     Comments: 1+ pitting edema below ankles; Trace edema along shins  Neurological:     Mental Status: He is alert.     Assessment/Plan:  Principal Problem:   AKI (acute kidney injury) (HCC) Active Problems:   Endocarditis   Urinary retention   Hyperkalemia   Microcytic anemia   MRSA infection   Epidural abscess  Franklin Kader is a 44 y.o. male with hx of IV drug use and untreated Hep C who was diagnosed with endocarditis and MRSA bacteremia at [facility], left AMA then presented to [facility] a few days later. Also found to have large epidural abscess, s/p laminectomy and evacuation by neurosurgery on 1/29. Symptoms and leukocytosis are improving with antibiotics.  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 Repeat TTE here shows 1 x 3 cm vegetation on the septal leaflet of the tricuspid valve, blood cultures here no growth to date. Fortunately, LVEF is preserved and TR is only mild at this time. CT surgeryconsultedfor evaluation and possible angiovac. 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 POD 2 from laminectomy and evacuation of epidural abscess. CT surgery desires TEE for further evaluation of the vegetation, in consideration of potential angio VAC debridement. Blood cultures remain without growth. Epidural abscess grew MRSA.   -TEE pending; f/u with CT surgery following results -increased frequency of pain meds -Appreciate ID  -continue daptomycin  -Stopped doxycycline   -will need at least 8 weeks abx therapy; would start 1/29 -Appreciate Neurosurgery  -activity as tolerated  -started DVT chemoprophylaxis today (Heparin)  Acute renal failure  Hyperkalemia; resolving  elevated AGMA Hyponatremia- improved Hypocalcemia Creatinine  7.23 on admission, downtrending with recent of 2.85. Potassium normalizing. Anuria had been present on admission, with good UOP following foley placement.  Pyuria and hematuria present on urinalysis.Likely multifactorial, possibly some contribution of acute obstruction.Renal u/s significant for  bladder distension and mild bilateral hydronephrosis. Temp HD catheter placed. C3 low at 60, normal C4, but nephro holding off on biopsy as he would not be a candidate for immunosuppression at this time.  -Appreciate nephrology recommendations -planning for HD tomorrow -Discontinued Lokelma 10g daily -Ca carbonate 500 TID, correct to 8s -Sodium Bicarbonate TID  -Started renvela -Keep Foley  in place at least 7 days, Foley care daily per urology due to phimosis   Hypervolemia Likely due to acute renal failure.TTEreassuring with preserved LVEF, normal CVP estimation by IVC. Renal functioning improving with diuresis. -Continue Monitoring UOP -IV Lasix 80 mg BID per nephro -HD as above -No antihypertensives at this time out of consideration of renal perfusion   Microcytic anemialikely 2/2 Chronic Disease Acute Anemia Hypo-proliferative per corrected retic count. Elevated ferritin, low iron and TIBC. Drop in hemoglobin to 6.7 overnight.Etiology of anemia likely multifactorial with prime component of anemia of chronic inflammation.Hgb stable following 1 unit pRBCson 1/28 -Monitor CBC   Hep C; Resolved Patient with hx of IV drug use. Reports hx of untreated Hep C. Hep C PCR negative     LOS: 4 days   Steven Cortez, Medical Student 05/25/2020, 12:57 PM

## 2020-05-25 NOTE — Plan of Care (Signed)
  Problem: Safety: Goal: Ability to remain free from injury will improve Outcome: Completed/Met

## 2020-05-25 NOTE — Progress Notes (Signed)
    [facility] has been requested to perform a transesophageal echocardiogram on 05/28/20 for bacteremia and tricuspid valve vegetation.  After careful review of history and examination, the risks and benefits of transesophageal echocardiogram have been explained including risks of esophageal damage, perforation (1:10,000 risk), bleeding, pharyngeal hematoma as well as other potential complications associated with conscious sedation including aspiration, arrhythmia, respiratory failure and death. Alternatives to treatment were discussed, questions were answered. Patient is willing to proceed.   TEE scheduled for 05/28/20 at 12:30pm with Dr. Jacques Navy.  Judy Pimple, PA-C 05/25/2020 2:08 PM

## 2020-05-25 NOTE — Progress Notes (Signed)
     [address]       [place] [address]             [phone]       Consult received. 45 year old male admitted with tricuspid valve endocarditis.  Cultures have grown MRSA.  The transthoracic echocardiogram was reviewed.  The vegetation appears mostly on the ventricular side, and is difficult to delineate this from the subvalvular apparatus.  The patient will require formal transesophageal echocardiogram for further evaluation.  If there is no significant atrial component then angio VAC debridement likely would not provide much benefit.  Continue antibiotic therapy for now.  Formal consult to follow after the transesophageal echocardiogram.  Corliss Skains

## 2020-05-25 NOTE — Progress Notes (Signed)
PHARMACY NOTE:  ANTIMICROBIAL RENAL DOSAGE ADJUSTMENT  Current antimicrobial regimen includes a mismatch between antimicrobial dosage and estimated renal function.  As per policy approved by the Pharmacy & Therapeutics and Medical Executive Committees, the antimicrobial dosage will be adjusted accordingly.  Current antimicrobial dosage:  Daptomycin 670mg  IV q48h  Indication: Endocarditis   Renal Function:  Estimated Creatinine Clearance: 34.2 mL/min (A) (by C-G formula based on SCr of 2.85 mg/dL (H)). []      On intermittent HD, scheduled: []      On CRRT    Antimicrobial dosage has been changed to:  Daptoymcin 670mg  IV q24h   Thank you for allowing pharmacy to be a part of this patient's care.  , [facility] 05/25/2020 8:24 AM

## 2020-05-25 NOTE — Progress Notes (Signed)
[facility] Progress Note   Subjective:   HD yest- removed 1000- also 1600 of UOP- labs reflective of HD yest-  He denies issue with HD- got up to walk with PT but in pain   Objective Vitals:   05/24/20 1608 05/24/20 1951 05/25/20 0047 05/25/20 0421  BP: (!) 167/96 (!) 160/89 (!) 172/100 (!) 152/78  Pulse: 88 80 88 82  Resp: (!) 22 18 18 18   Temp:  98.3 F (36.8 C) 98.4 F (36.9 C) 98.2 F (36.8 C)  TempSrc:  Oral Oral Oral  SpO2: 99% 97% 96% 98%  Weight:    80.8 kg  Height:       Physical Exam General: comfortable in chair Heart: hyperdynamic, no rub Lungs: clear ant, basilar rales R > L Abdomen: soft, nontender Extremities: 1+ edema to knees Skin: no janeway lesions, splinter hemorrhages; no petechiae; R calf with traumatic scab with some surrounding erythema that doesn't look infected Dialysis Access:  RIJ temp HD catheter- in for 3 days  Additional Objective Labs: Basic Metabolic Panel: Recent Labs  Lab 05/23/20 0650 05/24/20 0203 05/25/20 0451  NA 134* 133* 136  K 4.9 5.2* 4.6  CL 104 102 101  CO2 15* 15* 21*  GLUCOSE 92 149* 110*  BUN 165* 177* 136*  CREATININE 4.11* 4.16* 2.85*  CALCIUM 6.9* 7.5* 7.8*  PHOS 11.9* >30.0* 9.2*   Liver Function Tests: Recent Labs  Lab 05/21/20 0831 05/21/20 1850 05/22/20 0226 05/24/20 0203 05/25/20 0451  AST 30  --  39  --   --   ALT 38  --  37  --   --   ALKPHOS 297*  --  255*  --   --   BILITOT 1.1  --  1.2  --   --   PROT 6.1*  --  5.9*  --   --   ALBUMIN 1.7*   < > 1.7* 1.8* 1.7*   < > = values in this interval not displayed.   No results for input(s): LIPASE, AMYLASE in the last 168 hours. CBC: Recent Labs  Lab 05/21/20 1850 05/22/20 0226 05/22/20 2118 05/23/20 0423 05/24/20 0203 05/25/20 0451  WBC 15.9* 14.3*  --  18.1* 16.5* 18.0*  NEUTROABS 14.0*  --   --   --   --   --   HGB 7.3* 6.7*   < > 7.5* 8.0* 7.5*  HCT 21.8* 18.3*   < > 21.6* 22.2* 22.2*  MCV 78.4* 75.3*  --  78.8*  77.9* 81.0  PLT 154 160  --  188 197 166   < > = values in this interval not displayed.   Blood Culture    Component Value Date/Time   SDES ABSCESS 05/23/2020 0907   SPECREQUEST LUMBAR EPIDURAL SPEC A 05/23/2020 0907   CULT  05/23/2020 05/25/2020    FEW STAPHYLOCOCCUS AUREUS SUSCEPTIBILITIES TO FOLLOW Performed at [facility], [address], [place], [place]    REPTSTATUS PENDING 05/23/2020 05/25/2020    Cardiac Enzymes: Recent Labs  Lab 05/22/20 2118  CKTOTAL 103   CBG: Recent Labs  Lab 05/24/20 2131  GLUCAP 112*   Iron Studies:  No results for input(s): IRON, TIBC, TRANSFERRIN, FERRITIN in the last 72 hours. @lablastinr3 @ Studies/Results: No results found. Medications: . sodium chloride    . sodium chloride    . sodium chloride 3 mL/hr at 05/24/20 1600  . DAPTOmycin (CUBICIN)  IV     . sodium chloride   Intravenous  Once  . calcium carbonate  1 tablet Oral TID  . Chlorhexidine Gluconate Cloth  6 each Topical Q0600  . Chlorhexidine Gluconate Cloth  6 each Topical Q0600  . doxycycline  100 mg Oral Q12H  . furosemide  80 mg Intravenous BID  . heparin  5,000 Units Subcutaneous Q8H  . mupirocin ointment  1 application Nasal BID  . sevelamer carbonate  1,600 mg Oral TID WC  . sodium bicarbonate  1,300 mg Oral TID  . sodium zirconium cyclosilicate  10 g Oral Once    Assessment/Recommendations: Steven Cortez is a/an 45 y.o. male with a past medical history Hep C and IVDU who present w/ likely endocarditis and ARF with electrolyte abnormalities.  He was recently admitted to [facility] but left AMA and presented 1/27 to [facility].   Severe AKI:  w/ multiple electrolyte abnormalities and mild uremia; nonoliguric. Proceed with dialysis. Differential includes ATN, renal infarction from septic emboli but not on imaging, infection associated GN (bacterial and hepc).  UA with WBC/RBC/protein; UP/C 0.94 - not consistent with nephrotic syndrome.  Complements not depressed.  Renal US  showing mild hydro and distended bladder -IR placed temp cath;   1st HD 1/30- likely will need again, tentatively plan for another treatment on 2/1 -In the setting of endocarditis/IVDU I'm not inclined to proceed to renal biopsy at this time but will consider if no improvement and we would be able to immunosuppress him in the future -Foley catheter for now -Continue to monitor daily Cr, Dose meds for GFR -Monitor Daily I/Os, Daily weight  -Maintain MAP>65 for optimal renal perfusion.  -Avoid nephrotoxic medications including NSAID  Hyperkalemia: 2/2 AKI - will stop Daily lokelma  -Renal diet -Dialysis tomorrow  Hyperphos:  Phos 11.9 yesterday, measured at 30 today - suspect spurious -Started renvela -Starting HD  Metabolic acidosis: bicarb 15, cont oral bicarb for now, can stop if cont to require HD or resolves- are close  Hypocalcemia: - Corrects to 8s - Ca carbonate 500 TID for now  Hep C: not treated -Obtain PCR to determine viral load -Consider ID consult   Hyponatremia: Mild 2/2 aki.  improved with dialysis  Endocarditis/Disseminated infection with epidural abscess: likely based on history, imaging, and exam -Abx per primary team and ID- dapto/doxy   PE: abnormal VQ at [facility], had some hemoptysis after anticoagulated and developed thrombocytopenia outpt. -heparin gtt currently  Hypertension: related to volume excess, manage with dialysis  Anemia due to inflammation: transfuse as needed; Hb 8 this AM - no iron but will give ESA  Cecille Aver  05/25/2020, 8:47 AM  [facility] Pager: [phone]

## 2020-05-25 NOTE — Progress Notes (Signed)
Neurosurgery Service Progress Note  Subjective: No acute events overnight, LBP and BLE symptoms still significantly improved from preop  Objective: Vitals:   05/24/20 1951 05/25/20 0047 05/25/20 0421 05/25/20 1400  BP:  (!) 172/100 (!) 152/78 (!) 169/97  Pulse: 80 88 82 87  Resp: 18 18 18 20   Temp:  98.4 F (36.9 C) 98.2 F (36.8 C) 99.7 F (37.6 C)  TempSrc:  Oral Oral Oral  SpO2: 97% 96% 98% 97%  Weight:   80.8 kg   Height:        Physical Exam: BUE 5/5 BLE 4+/5 with normal sensation   Assessment & Plan: 45 y.o. man w/ h/o IVDU and lumbar epidural abscess s/p MIS lami and evacuation, recovering well.  -activity as tolerated, no brace needed -okay for DVT chemoPPx 1/31 -will sign off, call with any concerns or questions. Pt's sutures are absorbable and do not need to be removed. He should follow up with me in clinic in 2 weeks or whenever he gets out of the hospital.   2/31  05/25/20 2:31 PM

## 2020-05-25 NOTE — Progress Notes (Signed)
Occupational Therapy Treatment Patient Details Name: Steven Cortez MRN: [id number] DOB: 02-22-1976 Today's Date: 05/25/2020    History of present illness Pt is a 45 year old male with PMHx including Hep C and IVDU who presented with back pain, weakness, dyspnea, pleuritic chest pain, urinary retention, and pitting edema. 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   OT comments  Pt making slow progress to all goals. Pt continues to be most limited by back pain and chest pain.  This chest pain has been present since admission.  Pt feels weak.  Spoke to pt about attempting to eat his meals.  Pt stated he had not eaten much in days.  Pt overall requiring min guard with mobility and most UE adls and mod assist with LE adls due to pain.  Will continue to see pt with focus on LE adls and possibility of introducing adaptive equipment for LE dressing.   Follow Up Recommendations  No OT follow up;Supervision - Intermittent    Equipment Recommendations  3 in 1 bedside commode    Recommendations for Other Services      Precautions / Restrictions Precautions Precautions: Fall Precaution Comments: utilized back precautions for comfort Restrictions Weight Bearing Restrictions: No       Mobility Bed Mobility Overal bed mobility: Modified Independent             General bed mobility comments: HOB at 30 and pt did use bedrails. Required cues to use log rolling techniques to control pain.  Transfers Overall transfer level: Needs assistance Equipment used: Rolling walker (2 wheeled) Transfers: Sit to/from [employer] Sit to Stand: Supervision Stand pivot transfers: Min guard       General  transfer comment: Cues for hand placement    Balance Overall balance assessment: Needs assistance Sitting-balance support: Feet supported Sitting balance-Leahy Scale: Good     Standing balance support: Bilateral upper extremity supported;During functional activity Standing balance-Leahy Scale: Fair Standing balance comment: Pt felt weak and unstable. Pt was able to let go of walker for short amounts of time when toileting and grooming but because of weakness feel he is reliant on it for now.                           ADL either performed or assessed with clinical judgement   ADL Overall ADL's : Needs assistance/impaired Eating/Feeding: Independent;Sitting Eating/Feeding Details (indicate cue type and reason): no appetite.  Talked to pt about eating to get some energy. Pt very shakey and feels weak. Grooming: Min Engineer, structural;Wash/dry hands;Oral care Grooming Details (indicate cue type and reason): Pt stood for appx 5 min at sink with min guard feeling shaky and asked to return to bed.  HR in 90s and stable.             Lower Body Dressing: Moderate assistance;Sit to/from stand;Cueing for compensatory techniques Lower Body Dressing Details (indicate cue type and reason): Introduced figure four sitting to help wtih back pain. Toilet Transfer: Minimal assistance;Ambulation;RW Toilet Transfer Details (indicate cue type and reason): Pt walked to toilet and assist given to get down to toilet safely and back.up.  Pt fatigues quickly. Toileting- Clothing Manipulation and Hygiene: Minimal assistance;Sit to/from stand Toileting -  Clothing Manipulation Details (indicate cue type and reason): assist due to fatigue.     Functional mobility during ADLs: Min guard;Rolling walker General ADL Comments: Pt did not require a great amount of assist but complained of back pain and chest pain during entirety of session. Pt does not feel stable on his feet and asked to return  to bed.     Vision   Vision Assessment?: No apparent visual deficits   Perception     Praxis      Cognition Arousal/Alertness: Awake/alert Behavior During Therapy: WFL for tasks assessed/performed Overall Cognitive Status: Within Functional Limits for tasks assessed                                          Exercises     Shoulder Instructions       General Comments Pt limited most by fatigue and pain.    Pertinent Vitals/ Pain       Pain Assessment: 0-10 Pain Score: 6  Pain Location: back and chest Pain Descriptors / Indicators: Aching;Constant Pain Intervention(s): Limited activity within patient's tolerance;Repositioned;Monitored during session  Home Living Family/patient expects to be discharged to:: Private residence Living Arrangements: Other relatives (brother) Available Help at Discharge: Family;Available PRN/intermittently Type of Home: Apartment Home Access: Level entry     Home Layout: One level     Bathroom Shower/Tub: Chief Strategy Officer: Standard     Home Equipment: None          Prior Functioning/Environment Level of Independence: Independent        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        Progress Toward Goals  OT Goals(current goals can now be found in the care plan section)  Progress towards OT goals: Progressing toward goals  Acute Rehab OT Goals Patient Stated Goal: home, back to independent OT Goal Formulation: With patient Time For Goal Achievement: 06/07/20 Potential to Achieve Goals: Good ADL Goals Pt Will Perform Grooming: with modified independence;standing Pt Will Perform Lower Body Bathing: with modified independence;sit to/from stand;with adaptive equipment Pt Will Perform Upper Body Dressing: with modified independence;sitting Pt Will Perform Lower Body Dressing: with modified independence;sit to/from stand;with adaptive equipment Pt Will Transfer to Toilet:  with modified independence;ambulating Pt Will Perform Toileting - Clothing Manipulation and hygiene: with modified independence;sit to/from stand Additional ADL Goal #1: Pt will perform bed mobility at mod independent level as precursor to EOB/OOB ADL.  Plan Discharge plan remains appropriate    Co-evaluation                 AM-PAC OT "6 Clicks" Daily Activity     Outcome Measure   Help from another person eating meals?: None Help from another person taking care of personal grooming?: A Little Help from another person toileting, which includes using toliet, bedpan, or urinal?: A Little Help from another person bathing (including washing, rinsing, drying)?: A Little Help from another person to put on and taking off regular upper body clothing?: None Help from another person to put on and taking off regular lower body clothing?: A Little 6 Click Score: 20    End of Session Equipment Utilized During Treatment: Rolling walker  OT Visit Diagnosis: Other abnormalities of gait and mobility (R26.89);Muscle weakness (generalized) (M62.81);Pain   Activity Tolerance Patient limited by fatigue;Patient limited by pain   Patient Left  in bed;with call bell/phone within reach;with chair alarm set   Nurse Communication Mobility status        Time: 0840-0900 OT Time Calculation (min): 20 min  Charges: OT General Charges $OT Visit: 1 Visit OT Treatments $Self Care/Home Management : 8-22 mins   Hope Budds 05/25/2020, 9:11 AM

## 2020-05-25 NOTE — Progress Notes (Signed)
[facility]    Date of Admission:  05/21/2020   Total days of antibiotics 2           ID: Dublin Grayer is a 45 y.o. male with   Principal Problem:   AKI (acute kidney injury) (HCC) Active Problems:   Endocarditis   Urinary retention   Hyperkalemia   Microcytic anemia   MRSA infection   Epidural abscess    Subjective: Afebrile Clinically feeling better Much improved bilateral LE leg swelling No bowell incontinence Has foley catheter No headache, visual change Intermittent dry cough No chest pain/sob No abd pain  Medications:  . sodium chloride   Intravenous Once  . calcium carbonate  1 tablet Oral TID  . Chlorhexidine Gluconate Cloth  6 each Topical Q0600  . Chlorhexidine Gluconate Cloth  6 each Topical Q0600  . [START ON 05/26/2020] darbepoetin (ARANESP) injection - DIALYSIS  150 mcg Intravenous Q Tue-HD  . doxycycline  100 mg Oral Q12H  . furosemide  80 mg Intravenous BID  . heparin  5,000 Units Subcutaneous Q8H  . mupirocin ointment  1 application Nasal BID  . sevelamer carbonate  1,600 mg Oral TID WC  . sodium bicarbonate  1,300 mg Oral TID    Objective: Vital signs in last 24 hours: Temp:  [97.5 F (36.4 C)-98.4 F (36.9 C)] 98.2 F (36.8 C) (01/31 0421) Pulse Rate:  [56-90] 82 (01/31 0421) Resp:  [18-22] 18 (01/31 0421) BP: (152-193)/(78-145) 152/78 (01/31 0421) SpO2:  [96 %-99 %] 98 % (01/31 0421) Weight:  [80.8 kg-82.3 kg] 80.8 kg (01/31 0421)  Physical Exam  Constitutional: well developed, no distress, conversant HEENT: atraumatic, per, conj clear, oropharynx clear Cardiovascular: RRR no mrg Lungs: rhonchi and mild exp wheeze otherwise clear; normal respiratory effort Abdominal: Soft. NT/ND Ext: + trace bilateral LE edema.  Skin: Skin is warm and dry. No rash noted. No erythema.  Neuro: moving bilateral LE without obvious deficit Psychiatric: He has a normal mood and affect. His behavior is normal.   Foley  intact  Lab Results Recent Labs    05/24/20 0203 05/25/20 0451  WBC 16.5* 18.0*  HGB 8.0* 7.5*  HCT 22.2* 22.2*  NA 133* 136  K 5.2* 4.6  CL 102 101  CO2 15* 21*  BUN 177* 136*  CREATININE 4.16* 2.85*   Liver Panel Recent Labs    05/24/20 0203 05/25/20 0451  ALBUMIN 1.8* 1.7*   Sedimentation Rate No results for input(s): ESRSEDRATE in the last 72 hours. C-Reactive Protein No results for input(s): CRP in the last 72 hours.  Microbiology: 1/29 surgical lumbar spine tissue cx mrsa (S tetracycline, bactrim) 1/27 bcx negative   Imaging: 1/28 tte 1. Left ventricular ejection fraction, by estimation, is 60 to 65%. The  left ventricle has normal function. The left ventricle has no regional  wall motion abnormalities. There is mild left ventricular hypertrophy.  Left ventricular diastolic parameters  were normal.  2. Right ventricular systolic function is normal. The right ventricular  size is normal.  3. The mitral valve is normal in structure. No evidence of mitral valve  regurgitation.  4. Large mobile echogenic mass noted on the RV side of the tricuspid  valve septal leaflet, measuring 1 x 3 cm, suggestive of vegetation. The  tricuspid valve is abnormal.  5. The aortic valve is tricuspid. Aortic valve regurgitation is not  visualized.  6. The inferior vena cava is normal in size with greater than 50%  respiratory variability, suggesting right atrial pressure of 3 mmHg.    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 Epidural abscess compresses the thecal sac from T11-12 to the level of S2.  1/27 cxr Personally reviewed Multiple ill-defined nodular opacities of varying sizes, several of which show cavitation, likely representing multifocal septic emboli. These nodular opacities overall appear similar to findings on most recent CT. There is a small right pleural effusion with slight increase in airspace  opacity in the right base, likely focus of pneumonia.  Stable cardiac silhouette.  Assessment/Plan: Lines: 1/28-c right HD catheter   Abx: 1/27-c dapto 1/27-c doxy  44yo M IVDU, left ama from osh ([place] 10 days prior to admission; bcx mrsa) after dx endocarditis, admitted 1/27 for LE swelling/pain, decreased uop, and dyspnea    Imaging concerning for bilateral pulmonary septic emboli with cavitary nodules. tte large tv vegetation of 3 cm. MRI t/l spine shows L4-5 and L5-S1 discitis/OM along with t11-s2 epidural abscess. He is s/p l4-5, l5-s1 laminectomy and evacuation of epidural abscess on 1/29  At this time doesn't appear to have much if any LE neurological deficit post surgery  Primary team had spoken with ct surgery regarding angiovac  -continue daptomycin; ok with stopping doxy -await ct surgery input -will need at least 8 weeks abx therapy; would start 1/29   [facility]   05/25/2020, 9:21 AM

## 2020-05-25 NOTE — TOC Progression Note (Addendum)
Transition of Care [facility]) - Progression Note    Patient Details  Name: Steven Cortez MRN: [id number] Date of Birth: 14-Jun-1975  [facility]) CM/SW Contact  Leone Haven, RN Phone Number: 05/25/2020, 4:47 PM  Clinical Narrative:    NCM spoke with patient over the phone, he states he lives with his brother.  His brother will transport him home at discharge.  He states he will need ast with medications at dc as well, NCM will assist with Match Letter.  He states he will need a rolling walker thru charity.  NCM made referral to [facility] with [employer] for rolling walker.  Patient states he does not want the 3 n 1.         Expected Discharge Plan and Services                                                 Social Determinants of Health (SDOH) Interventions    Readmission Risk Interventions No flowsheet data found.

## 2020-05-26 ENCOUNTER — Encounter (HOSPITAL_COMMUNITY): Payer: Self-pay

## 2020-05-26 LAB — CBC
HCT: 20.2 % — ABNORMAL LOW (ref 39.0–52.0)
Hemoglobin: 7 g/dL — ABNORMAL LOW (ref 13.0–17.0)
MCH: 28.1 pg (ref 26.0–34.0)
MCHC: 34.7 g/dL (ref 30.0–36.0)
MCV: 81.1 fL (ref 80.0–100.0)
Platelets: 131 10*3/uL — ABNORMAL LOW (ref 150–400)
RBC: 2.49 MIL/uL — ABNORMAL LOW (ref 4.22–5.81)
RDW: 15.9 % — ABNORMAL HIGH (ref 11.5–15.5)
WBC: 12.9 10*3/uL — ABNORMAL HIGH (ref 4.0–10.5)
nRBC: 0 % (ref 0.0–0.2)

## 2020-05-26 LAB — RENAL FUNCTION PANEL
Albumin: 1.7 g/dL — ABNORMAL LOW (ref 3.5–5.0)
Anion gap: 12 (ref 5–15)
BUN: 123 mg/dL — ABNORMAL HIGH (ref 6–20)
CO2: 23 mmol/L (ref 22–32)
Calcium: 7.7 mg/dL — ABNORMAL LOW (ref 8.9–10.3)
Chloride: 99 mmol/L (ref 98–111)
Creatinine, Ser: 1.91 mg/dL — ABNORMAL HIGH (ref 0.61–1.24)
GFR, Estimated: 44 mL/min — ABNORMAL LOW (ref >60)
Glucose, Bld: 97 mg/dL (ref 70–99)
Phosphorus: 6.7 mg/dL — ABNORMAL HIGH (ref 2.5–4.6)
Potassium: 4.2 mmol/L (ref 3.5–5.1)
Sodium: 134 mmol/L — ABNORMAL LOW (ref 135–145)

## 2020-05-26 LAB — LACTIC ACID, PLASMA
Lactic Acid, Venous: 1.3 mmol/L (ref 0.5–1.9)
Lactic Acid, Venous: 2.5 mmol/L (ref 0.5–1.9)

## 2020-05-26 LAB — CK: Total CK: 35 U/L — ABNORMAL LOW (ref 49–397)

## 2020-05-26 LAB — CULTURE, BLOOD (ROUTINE X 2): Culture: NO GROWTH

## 2020-05-26 LAB — LIPASE, BLOOD: Lipase: 23 U/L (ref 11–51)

## 2020-05-26 LAB — HEMOGLOBIN AND HEMATOCRIT, BLOOD
HCT: 22.9 % — ABNORMAL LOW (ref 39.0–52.0)
Hemoglobin: 7.8 g/dL — ABNORMAL LOW (ref 13.0–17.0)

## 2020-05-26 LAB — PREPARE RBC (CROSSMATCH)

## 2020-05-26 MED ORDER — HEPARIN SODIUM (PORCINE) 1000 UNIT/ML IJ SOLN
INTRAMUSCULAR | Status: AC
  Administered 2020-05-26: 1000 [IU]
  Filled 2020-05-26: qty 3

## 2020-05-26 MED ORDER — DARBEPOETIN ALFA 150 MCG/0.3ML IJ SOSY
PREFILLED_SYRINGE | INTRAMUSCULAR | Status: AC
  Filled 2020-05-26: qty 0.3

## 2020-05-26 MED ORDER — SODIUM CHLORIDE 0.9% IV SOLUTION: Freq: Once | INTRAVENOUS | Status: AC

## 2020-05-26 MED ORDER — LIDOCAINE VISCOUS HCL 2 % MT SOLN
15.0000 mL | Freq: Once | OROMUCOSAL | Status: AC
  Administered 2020-05-26: 15 mL via ORAL
  Filled 2020-05-26: qty 15

## 2020-05-26 MED ORDER — ALUM & MAG HYDROXIDE-SIMETH 200-200-20 MG/5ML PO SUSP
30.0000 mL | Freq: Once | ORAL | Status: AC
  Administered 2020-05-26: 30 mL via ORAL
  Filled 2020-05-26: qty 30

## 2020-05-26 MED ORDER — SODIUM CHLORIDE 0.9 % IV SOLN
670.0000 mg | INTRAVENOUS | Status: DC
  Administered 2020-05-27: 670 mg via INTRAVENOUS
  Filled 2020-05-26 (×2): qty 13.4

## 2020-05-26 NOTE — Progress Notes (Signed)
PHARMACY NOTE:  ANTIMICROBIAL RENAL DOSAGE ADJUSTMENT  Current antimicrobial regimen includes a mismatch between antimicrobial dosage and estimated renal function.  As per policy approved by the Pharmacy & Therapeutics and Medical Executive Committees, the antimicrobial dosage will be adjusted accordingly.  Current antimicrobial dosage:  Daptomycin 670mg  IV q24h  Indication: Endocarditis   Renal Function:  Estimated Creatinine Clearance: 51 mL/min (A) (by C-G formula based on SCr of 1.91 mg/dL (H)). []      On intermittent HD, scheduled: []      On CRRT    Antimicrobial dosage has been changed to:  Daptoymcin 670mg  IV q48h   Patient still requiring some hemodialysis so hard to assess true clearance. UOP improved. Will keep on q 48 for now and monitor.   Thank you for allowing pharmacy to be a part of this patient's care.  , PharmD, BCPS, BCIDP Infectious Diseases Clinical Pharmacist Phone: [phone] 05/26/2020 12:23 PM

## 2020-05-26 NOTE — Procedures (Signed)
Patient was seen on dialysis and the procedure was supervised.  BFR 250  Via temp cath BP is  160/89.   Patient appears to be tolerating treatment well  Cecille Aver 05/26/2020

## 2020-05-26 NOTE — Progress Notes (Signed)
[facility]    Date of Admission:  05/21/2020   Total days of antibiotics 2           ID: Steven Cortez is a 45 y.o. male with   Principal Problem:   AKI (acute kidney injury) (HCC) Active Problems:   Endocarditis   Urinary retention   Hyperkalemia   Microcytic anemia   MRSA infection   Epidural abscess    Subjective: Afebrile Await tee Creatinine still high, started HD on 1/30 No n/v/diarrhea  No myalgia  Medications:  . sodium chloride   Intravenous Once  . sodium chloride   Intravenous Once  . calcium carbonate  1 tablet Oral TID  . Chlorhexidine Gluconate Cloth  6 each Topical Q0600  . Chlorhexidine Gluconate Cloth  6 each Topical Q0600  . darbepoetin (ARANESP) injection - DIALYSIS  150 mcg Intravenous Q Tue-HD  . furosemide  80 mg Intravenous BID  . heparin  5,000 Units Subcutaneous Q8H  . mupirocin ointment  1 application Nasal BID  . sevelamer carbonate  1,600 mg Oral TID WC  . sodium bicarbonate  1,300 mg Oral TID    Objective: Vital signs in last 24 hours: Temp:  [98.2 F (36.8 C)-99.7 F (37.6 C)] 98.3 F (36.8 C) (02/01 0725) Pulse Rate:  [77-88] 77 (02/01 0725) Resp:  [18-20] 18 (01/31 2031) BP: (151-169)/(77-98) 151/84 (02/01 0800) SpO2:  [94 %-97 %] 94 % (02/01 0725) Weight:  [81 kg-81.9 kg] 81.9 kg (02/01 0720)  Physical Exam  Constitutional: well developed, no distress, conversant HEENT: atraumatic, per, conj clear, oropharynx clear Cardiovascular: RRR no mrg Lungs: rhonchi and mild exp wheeze otherwise clear; normal respiratory effort Abdominal: Soft. NT/ND Ext: + trace bilateral LE edema.  Skin: Skin is warm and dry. No rash noted. No erythema.  Neuro: moving bilateral LE without obvious deficit Psychiatric: He has a normal mood and affect. His behavior is normal.   Foley intact RIGHT dialysis catheter site no tenderness/fluctuance/erythema  Lab Results Recent Labs    05/25/20 0451 05/26/20 0600   WBC 18.0* 12.9*  HGB 7.5* 7.0*  HCT 22.2* 20.2*  NA 136 134*  K 4.6 4.2  CL 101 99  CO2 21* 23  BUN 136* 123*  CREATININE 2.85* 1.91*   Liver Panel Recent Labs    05/25/20 0451 05/26/20 0600  ALBUMIN 1.7* 1.7*   Sedimentation Rate No results for input(s): ESRSEDRATE in the last 72 hours. C-Reactive Protein No results for input(s): CRP in the last 72 hours.  Microbiology: 1/29 surgical lumbar spine tissue cx mrsa (S tetracycline, bactrim) 1/27 bcx negative  Serology: 05/25/19 hep b core ab, surface Ab reactive; sAg negative 05/22/19 hep c rna pcr negative  Imaging: 1/28 tte 1. Left ventricular ejection fraction, by estimation, is 60 to 65%. The  left ventricle has normal function. The left ventricle has no regional  wall motion abnormalities. There is mild left ventricular hypertrophy.  Left ventricular diastolic parameters  were normal.  2. Right ventricular systolic function is normal. The right ventricular  size is normal.  3. The mitral valve is normal in structure. No evidence of mitral valve  regurgitation.  4. Large mobile echogenic mass noted on the RV side of the tricuspid  valve septal leaflet, measuring 1 x 3 cm, suggestive of vegetation. The  tricuspid valve is abnormal.  5. The aortic valve is tricuspid. Aortic valve regurgitation is not  visualized.  6. The inferior vena cava is normal in  size with greater than 50%  respiratory variability, suggesting right atrial pressure of 3 mmHg.    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 Epidural abscess compresses the thecal sac from T11-12 to the level of S2.  1/27 cxr Personally reviewed Multiple ill-defined nodular opacities of varying sizes, several of which show cavitation, likely representing multifocal septic emboli. These nodular opacities overall appear similar to findings on most recent CT. There is a small right pleural effusion with  slight increase in airspace opacity in the right base, likely focus of pneumonia.  Stable cardiac silhouette.  Assessment/Plan: Lines: 1/28-c right HD catheter   Abx: 1/27-c dapto 1/27-c doxy  44yo M IVDU, prior hep c/b infection (not active), left ama from osh ([place] 10 days prior to admission; bcx mrsa) after dx endocarditis, admitted 1/27 for LE swelling/pain, decreased uop, and dyspnea. Course complicated by AKI   Imaging concerning for bilateral pulmonary septic emboli with cavitary nodules. tte large tv vegetation of 3 cm. MRI t/l spine shows L4-5 and L5-S1 discitis/OM along with t11-s2 epidural abscess. He is s/p l4-5, l5-s1 laminectomy and evacuation of epidural abscess on 1/29  At this time doesn't appear to have much if any LE neurological deficit post surgery  Primary team had spoken with ct surgery regarding angiovac    Aki: c3 level low; normal c4. Suspect mostly due to sepsis atn. However, possibility of postinfectious GN (GN does occur much earlier in staph aureus related process). For infectious gn, treatment is against underlying cause.   ------------ 2/1 assessment No change, await CTS evaluation for angiovac; tee planned HD started 1/30  -continue daptomycin renal dosing per pharmacy -await TEE -will need at least 8 weeks abx therapy; would start 1/29   [facility]   05/26/2020, 9:30 AM

## 2020-05-26 NOTE — Plan of Care (Signed)

## 2020-05-26 NOTE — Progress Notes (Signed)
Subjective:  Patient seen following HD. Reports generalized weakness persists, but able to ambulate to chair. Continues without focal LE weakness. Endorses dry cough, with associated spot tenderness along chest. Denies hemoptysis. Conveys prior to [facility] hospitalization has not been hospitalized in years. To the best of his knowledge, he has not been on anticoagulation prior to that.   Objective:  Vital signs in last 24 hours: Vitals:   05/26/20 0725 05/26/20 0800 05/26/20 1005 05/26/20 1033  BP: (!) 159/90 (!) 151/84  (!) 162/89  Pulse: 77  84 91  Resp:    16  Temp: 98.3 F (36.8 C)  98 F (36.7 C) 98.8 F (37.1 C)  TempSrc: Oral  Oral Oral  SpO2: 94%  95% 96%  Weight:   81 kg   Height:       Weight change: -1.3 kg  Intake/Output Summary (Last 24 hours) at 05/26/2020 1152 Last data filed at 05/26/2020 1005 Gross per 24 hour  Intake 676.98 ml  Output 2900 ml  Net -2223.02 ml   Physical Exam Constitutional:      Appearance: He is not toxic-appearing.     Comments: Patient seen lying in bed comfortably, NAD.   Cardiovascular:     Rate and Rhythm: Normal rate and regular rhythm.  Pulmonary:     Effort: No respiratory distress.     Breath sounds: Normal breath sounds.  Musculoskeletal:     Comments: Symmetric trace LE edema   Neurological:     Mental Status: He is alert.      Assessment/Plan:  Resolved: Hyperkalemia elevated AGMA Hypocalcemia Hep C: Patient with hx of IV drug use. Reportshx of untreated Hep C. Hep C PCR negative this admission.   Principal Problem:   AKI (acute kidney injury) (HCC) Active Problems:   Endocarditis   Urinary retention   Hyperkalemia   Microcytic anemia   MRSA infection   Epidural abscess  Steven Houchinsis a 45 y.o.malewith hx of IV drug use and untreated Hep Cwho was diagnosed with endocarditis and MRSA bacteremia at [facility], left AMA then presented to [facility] a few days later. Also found to have large  epidural abscess, s/p laminectomy and evacuation by neurosurgery on 1/29. Symptoms and leukocytosis are improving with antibiotics.   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 Repeat TTE here shows 1 x 3 cm vegetation on the septal leaflet of the tricuspid valve, blood cultures here no growth to date. Fortunately, LVEF is preserved and TR is only mild at this time. CT surgeryconsultedfor evaluationand possible angiovac. MRI of the T/L-spinefound to have compressive epidural abscess from T11/12 - S2, and lumbar osteomyelitis.POD 9from laminectomy and evacuation of epidural abscess. CT surgery desires TEE, in consideration of potential angio VAC debridement. Epidural abscess grew MRSA.   -TEE scheduled for 2/3; f/u with CT surgery following results -Appreciate ID  -continue daptomycin   -will need at least 8 weeks abx therapy -Appreciate Neurosurgery  -activity as tolerated -f/u blood cultures    Thrombocytopenia Patient with mild thrombocytopenia 197 -->166-->131 today. Notably resumed heparin prophylaxis two days ago following epidural abscess evacuation. Patient had thrombocytopenia following starting anticoagulation at [facility] prior to this admission. Prior to this, patient reports has not been hospitalized in years, and to the best of his knowledge has never been on anticoagulation  previously. Mild decrease in Hgb overnight. Low suspicion for HIT in further consideration of low probability per 4Ts score.   -Trend CBC -Intravascular hemolysis labs if persists    Acute renal failure  Hyponatremia Hypocalcemia - Resolved Creatinine  7.23 on admission, downtrending with recent of 1.91. Hyperkalemia resolved. Anuria had been present on  admission, with good UOP following foley placement.Pyuria and hematuria present on urinalysis.Likely multifactorial, possibly some contribution of acute obstruction.Renal u/s significant for bladder distension and mild bilateral hydronephrosis.C3 low at 60, normal C4, but nephro holding off on biopsy as he would not be a candidate for immunosuppression at this time. Renal function improving with dialysis and autodiuresis.  -Appreciate nephrology recommendations  -HD today  -Renvela -discontinued Ca carbonate 500 TID -discontinued Sodium Bicarbonate TID  -Keep Foley in place at least 7 days, Foley caredaily per urology due to phimosis   Hypervolemia; resolving Likely due to acute renal failure.TTEreassuring with preserved LVEF, normal CVP estimation by IVC. Renal functioning improving with diuresis.Near euvolemic.  -Continue Monitoring UOP -IV Lasix 80 mg BIDper nephro -HD as above -Noantihypertensives at this time out of consideration of renal perfusion   Microcytic anemialikely 2/2 Chronic Disease Acute Anemia Hypo-proliferative per corrected retic count. Elevated ferritin, low iron and TIBC. Drop in hemoglobin to 6.7 overnight.Etiology of anemia likely multifactorial with prime component of anemia of chronic inflammation.Hgb initially stable following 1 unit pRBCson 1/28, but dropped to 7.   -Receiving 1 unit pRBC transfusion, f/u post H&H -Monitor CBC    LOS: 5 days   [name], Medical Student 05/26/2020, 11:52 AM

## 2020-05-26 NOTE — Progress Notes (Signed)
RN messaged IMTS about patient having 8/10 throbbing chest pain in the center of his chest. Team went to assess patient at bedside. Patient endorse generalized abd pain, worse in the epigastric area that is 6/10 pain but 8/10 when he coughs. He also endorse some nausea, cough and SOB but denies any back pain, lightheadedness or leg pain. On exam significant for tenderness to palpation of the epigastric area, lungs a little coarse but cleared with cough. RRR.   Plan:  --Getting an EKG --Gave patient GI cocktail --Checking lipase and lactate

## 2020-05-26 NOTE — Progress Notes (Signed)
Patient lactic acid 2.5.Call placed to MD. No new orders received.

## 2020-05-26 NOTE — Progress Notes (Signed)
Cigarette smoke scent was coming from patient's room. He had a visitor that was in his room as well. Asked patient and visitor about the smoke and they both stated they didn't know where it came from. Searched patient's room and 2 cigarettes and a lighter was found in his shirt pocket bundled in the corner of the room. The cigarettes and lighter were removed from room and placed in the patient personal med drawer outside of room. Patient will be able to receive back on discharge from hospital. Policy explained to patient and visitor. Aware that security will be called if smoking suspected in room again.

## 2020-05-26 NOTE — Progress Notes (Signed)
[facility] Progress Note   Subjective:   Seen on HD-  Had 2300 of UOP-  Seems that maybe his renal function improved from 1/31 to 2/1 without dialysis -  Just c/o some minor pain in side and back    Objective Vitals:   05/26/20 0448 05/26/20 0720 05/26/20 0725 05/26/20 0800  BP: (!) 152/77  (!) 159/90 (!) 151/84  Pulse: 88  77   Resp:      Temp: 98.8 F (37.1 C)  98.3 F (36.8 C)   TempSrc: Oral  Oral   SpO2: 97%  94%   Weight:  81.9 kg    Height:       Physical Exam General: NAD Heart: hyperdynamic, no rub Lungs: clear ant, basilar rales R > L Abdomen: soft, nontender Extremities: edema much better  Skin: no janeway lesions, splinter hemorrhages; no petechiae; R calf with traumatic scab with some surrounding erythema that doesn't look infected Dialysis Access:  RIJ temp HD catheter- in for 4 days  Additional Objective Labs: Basic Metabolic Panel: Recent Labs  Lab 05/24/20 0203 05/25/20 0451 05/26/20 0600  NA 133* 136 134*  K 5.2* 4.6 4.2  CL 102 101 99  CO2 15* 21* 23  GLUCOSE 149* 110* 97  BUN 177* 136* 123*  CREATININE 4.16* 2.85* 1.91*  CALCIUM 7.5* 7.8* 7.7*  PHOS >30.0* 9.2* 6.7*   Liver Function Tests: Recent Labs  Lab 05/21/20 0831 05/21/20 1850 05/22/20 0226 05/24/20 0203 05/25/20 0451 05/26/20 0600  AST 30  --  39  --   --   --   ALT 38  --  37  --   --   --   ALKPHOS 297*  --  255*  --   --   --   BILITOT 1.1  --  1.2  --   --   --   PROT 6.1*  --  5.9*  --   --   --   ALBUMIN 1.7*   < > 1.7* 1.8* 1.7* 1.7*   < > = values in this interval not displayed.   No results for input(s): LIPASE, AMYLASE in the last 168 hours. CBC: Recent Labs  Lab 05/21/20 1850 05/22/20 0226 05/22/20 2118 05/23/20 0423 05/24/20 0203 05/25/20 0451 05/26/20 0600  WBC 15.9* 14.3*  --  18.1* 16.5* 18.0* 12.9*  NEUTROABS 14.0*  --   --   --   --   --   --   HGB 7.3* 6.7*   < > 7.5* 8.0* 7.5* 7.0*  HCT 21.8* 18.3*   < > 21.6* 22.2* 22.2*  20.2*  MCV 78.4* 75.3*  --  78.8* 77.9* 81.0 81.1  PLT 154 160  --  188 197 166 131*   < > = values in this interval not displayed.   Blood Culture    Component Value Date/Time   SDES ABSCESS 05/23/2020 0907   SPECREQUEST LUMBAR EPIDURAL SPEC A 05/23/2020 0907   CULT  05/23/2020 0907    FEW METHICILLIN RESISTANT STAPHYLOCOCCUS AUREUS NO ANAEROBES ISOLATED; CULTURE IN PROGRESS FOR 5 DAYS    REPTSTATUS PENDING 05/23/2020 0907    Cardiac Enzymes: Recent Labs  Lab 05/22/20 2118 05/26/20 0600  CKTOTAL 103 35*   CBG: Recent Labs  Lab 05/24/20 2131  GLUCAP 112*   Iron Studies:  No results for input(s): IRON, TIBC, TRANSFERRIN, FERRITIN in the last 72 hours. @lablastinr3 @ Studies/Results: No results found. Medications: . sodium chloride    . sodium chloride    .  sodium chloride 3 mL/hr at 05/24/20 1600  . [START ON 05/27/2020] DAPTOmycin (CUBICIN)  IV     . sodium chloride   Intravenous Once  . sodium chloride   Intravenous Once  . calcium carbonate  1 tablet Oral TID  . Chlorhexidine Gluconate Cloth  6 each Topical Q0600  . Chlorhexidine Gluconate Cloth  6 each Topical Q0600  . Darbepoetin Alfa      . darbepoetin (ARANESP) injection - DIALYSIS  150 mcg Intravenous Q Tue-HD  . furosemide  80 mg Intravenous BID  . heparin  5,000 Units Subcutaneous Q8H  . heparin sodium (porcine)      . mupirocin ointment  1 application Nasal BID  . sevelamer carbonate  1,600 mg Oral TID WC  . sodium bicarbonate  1,300 mg Oral TID    Assessment/Recommendations: Steven Cortez is a/an 45 y.o. male with a past medical history Hep C and IVDU who present w/ likely endocarditis and ARF with electrolyte abnormalities.  He was recently admitted to [facility] but left AMA and presented 1/27 to [facility].   Severe AKI:  w/ multiple electrolyte abnormalities and mild uremia; nonoliguric. Proceed with dialysis. Differential includes ATN, renal infarction from septic emboli but not on imaging, infection  associated GN (bacterial and hepc).  UA with WBC/RBC/protein; UP/C 0.94 - not consistent with nephrotic syndrome.  Complements not depressed.  Renal US showing mild hydro and distended bladder -IR placed temp cath;   1st HD 1/30-  another treatment on 2/1.  Is making more urine, possibly recovering ?  Will hold HD after today and watch for progress  -In the setting of endocarditis/IVDU I'm not inclined to proceed to renal biopsy at this time but will consider if no improvement and we would be able to immunosuppress him in the future -Foley catheter for now -Continue to monitor daily Cr, Dose meds for GFR -Monitor Daily I/Os, Daily weight  -Maintain MAP>65 for optimal renal perfusion.  -Avoid nephrotoxic medications including NSAID  Hyperkalemia: 2/2 AKI resolved    Hyperphos:  Phos 11.9 yesterday, measured at 30 today - suspect spurious -Started renvela Improving with  HD  Metabolic acidosis: better- stop oral bicarb  Hypocalcemia: - Corrects to 8s - Ca carbonate 500 TID for now  Hep C: not treated -Obtain PCR to determine viral load -Consider ID consult   Hyponatremia: Mild 2/2 aki.  improved with dialysis  Endocarditis/Disseminated infection with epidural abscess: likely based on history, imaging, and exam -Abx per primary team and ID- dapto/doxy   PE: abnormal VQ at [facility], had some hemoptysis after anticoagulated and developed thrombocytopenia outpt. -heparin gtt currently  Hypertension: related to volume excess, manage with dialysis  Anemia due to inflammation: transfuse as needed; Hb 8 this AM - no iron but will give ESA  Cecille Aver  05/26/2020, 9:43 AM  [facility] Pager: [phone]

## 2020-05-26 NOTE — Plan of Care (Signed)

## 2020-05-27 LAB — CBC
HCT: 21.8 % — ABNORMAL LOW (ref 39.0–52.0)
Hemoglobin: 7.5 g/dL — ABNORMAL LOW (ref 13.0–17.0)
MCH: 28.8 pg (ref 26.0–34.0)
MCHC: 34.4 g/dL (ref 30.0–36.0)
MCV: 83.8 fL (ref 80.0–100.0)
Platelets: 102 10*3/uL — ABNORMAL LOW (ref 150–400)
RBC: 2.6 MIL/uL — ABNORMAL LOW (ref 4.22–5.81)
RDW: 15.6 % — ABNORMAL HIGH (ref 11.5–15.5)
WBC: 10.1 10*3/uL (ref 4.0–10.5)
nRBC: 0 % (ref 0.0–0.2)

## 2020-05-27 LAB — TYPE AND SCREEN
ABO/RH(D): O POS
Antibody Screen: NEGATIVE
Unit division: 0

## 2020-05-27 LAB — BPAM RBC
Blood Product Expiration Date: 202203032359
ISSUE DATE / TIME: 202202011253
Unit Type and Rh: 5100

## 2020-05-27 LAB — HEPATIC FUNCTION PANEL
ALT: 22 U/L (ref 0–44)
AST: 23 U/L (ref 15–41)
Albumin: 1.7 g/dL — ABNORMAL LOW (ref 3.5–5.0)
Alkaline Phosphatase: 186 U/L — ABNORMAL HIGH (ref 38–126)
Bilirubin, Direct: 0.2 mg/dL (ref 0.0–0.2)
Indirect Bilirubin: 0.6 mg/dL (ref 0.3–0.9)
Total Bilirubin: 0.8 mg/dL (ref 0.3–1.2)
Total Protein: 6 g/dL — ABNORMAL LOW (ref 6.5–8.1)

## 2020-05-27 LAB — RENAL FUNCTION PANEL
Albumin: 1.8 g/dL — ABNORMAL LOW (ref 3.5–5.0)
Anion gap: 9 (ref 5–15)
BUN: 84 mg/dL — ABNORMAL HIGH (ref 6–20)
CO2: 26 mmol/L (ref 22–32)
Calcium: 7.8 mg/dL — ABNORMAL LOW (ref 8.9–10.3)
Chloride: 100 mmol/L (ref 98–111)
Creatinine, Ser: 1.75 mg/dL — ABNORMAL HIGH (ref 0.61–1.24)
GFR, Estimated: 49 mL/min — ABNORMAL LOW (ref >60)
Glucose, Bld: 105 mg/dL — ABNORMAL HIGH (ref 70–99)
Phosphorus: 5.5 mg/dL — ABNORMAL HIGH (ref 2.5–4.6)
Potassium: 4 mmol/L (ref 3.5–5.1)
Sodium: 135 mmol/L (ref 135–145)

## 2020-05-27 LAB — TECHNOLOGIST SMEAR REVIEW: WBC Morphology: DECREASED

## 2020-05-27 MED ORDER — SODIUM CHLORIDE 0.9 % IV SOLN: INTRAVENOUS | Status: DC

## 2020-05-27 MED ORDER — AMLODIPINE BESYLATE 5 MG PO TABS
5.0000 mg | ORAL_TABLET | Freq: Every day | ORAL | Status: DC
  Administered 2020-05-27 – 2020-05-30 (×4): 5 mg via ORAL
  Filled 2020-05-27 (×4): qty 1

## 2020-05-27 MED ORDER — FUROSEMIDE 40 MG PO TABS
40.0000 mg | ORAL_TABLET | Freq: Two times a day (BID) | ORAL | Status: DC
  Administered 2020-05-27 – 2020-05-28 (×2): 40 mg via ORAL
  Filled 2020-05-27 (×2): qty 1

## 2020-05-27 NOTE — Progress Notes (Signed)
Physical Therapy Treatment Patient Details Name: Steven Cortez MRN: [id number] DOB: 06/13/1975 Today's Date: 05/27/2020    History of Present Illness Pt is a 45 year old male with PMHx including Hep C and IVDU who presented with back pain, weakness, dyspnea, pleuritic chest pain, urinary retention, and pitting edema. He was recently admitted to [facility] and found to have tricuspid endocarditis with MRSA bacteremia, and pulmonary septic emboli but left AMA. 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    PT Comments    Pt progressing steadily towards his physical therapy goals. Reports chest pain and RLE radicular pain overnight but reports it has resolved. Able to participate in seated warm up BLE exercises and ambulating x 180 feet with a walker at a supervision level. Presents with decreased endurance. Will continue to follow acutely to progress as tolerated.     Follow Up Recommendations  No PT follow up     Equipment Recommendations  Rolling walker with 5" wheels    Recommendations for Other Services       Precautions / Restrictions Precautions Precautions: Fall Precaution Comments: utilized back precautions for comfort Restrictions Weight Bearing Restrictions: No    Mobility  Bed Mobility               General bed mobility comments: OOB in chair  Transfers Overall transfer level: Modified independent Equipment used: Rolling walker (2 wheeled)                Ambulation/Gait Ambulation/Gait assistance: Supervision Gait Distance (Feet): 180 Feet Assistive device: Rolling walker (2 wheeled) Gait Pattern/deviations: Step-through pattern;Decreased stride length Gait velocity: decreased   General Gait Details: Cues for walker proximity, upright posture. No gross instability   Stairs             Wheelchair  Mobility    Modified Rankin (Stroke Patients Only)       Balance Overall balance assessment: Needs assistance Sitting-balance support: Feet supported Sitting balance-Leahy Scale: Good     Standing balance support: Bilateral upper extremity supported Standing balance-Leahy Scale: Fair                              Cognition Arousal/Alertness: Awake/alert Behavior During Therapy: WFL for tasks assessed/performed Overall Cognitive Status: Within Functional Limits for tasks assessed                                        Exercises General Exercises - Lower Extremity Long Arc Quad: Both;10 reps;Seated Hip Flexion/Marching: Both;10 reps;Seated    General Comments        Pertinent Vitals/Pain Pain Assessment: Faces Faces Pain Scale: Hurts a little bit Pain Location: back Pain Descriptors / Indicators: Discomfort;Grimacing;Guarding Pain Intervention(s): Monitored during session    Home Living                      Prior Function            PT Goals (current goals can now be found in the care plan section) Acute Rehab PT Goals Patient Stated Goal: home, back to independent PT Goal Formulation: With patient Time For Goal Achievement: 06/08/20 Potential to Achieve Goals: Good Progress towards PT goals: Progressing toward goals  Frequency    Min 3X/week      PT Plan Current plan remains appropriate    Co-evaluation              AM-PAC PT "6 Clicks" Mobility   Outcome Measure  Help needed turning from your back to your side while in a flat bed without using bedrails?: None Help needed moving from lying on your back to sitting on the side of a flat bed without using bedrails?: None Help needed moving to and from a bed to a chair (including a wheelchair)?: None Help needed standing up from a chair using your arms (e.g., wheelchair or bedside chair)?: None Help needed to walk in hospital room?: None Help needed  climbing 3-5 steps with a railing? : A Little 6 Click Score: 23    End of Session   Activity Tolerance: Patient tolerated treatment well Patient left: with call bell/phone within reach;in chair Nurse Communication: Mobility status PT Visit Diagnosis: Unsteadiness on feet (R26.81);Pain Pain - part of body:  (back)     Time: 3893-7342 PT Time Calculation (min) (ACUTE ONLY): 14 min  Charges:  $Gait Training: 8-22 mins                     Lillia Pauls, PT, DPT Acute Rehabilitation Services Pager [phone] Office [phone]    Norval Morton 05/27/2020, 10:40 AM

## 2020-05-27 NOTE — Progress Notes (Addendum)
 Subjective: Patient reports pain improved overnight with antacids. No longer having pain. Denies chest pain and sob.   Objective:  Vital signs in last 24 hours: Vitals:   05/26/20 2315 05/27/20 0333 05/27/20 0850 05/27/20 1254  BP: (!) 159/100 (!) 147/76 (!) 168/74 (!) 168/90  Pulse: 85 (!) 43 84 81  Resp: 20 18 18 18  Temp: 98.2 F (36.8 C) 98.4 F (36.9 C)  98 F (36.7 C)  TempSrc: Oral Oral  Oral  SpO2: 96% 97% 98% 98%  Weight:  79.1 kg    Height:       Weight change: 0.9 kg  Intake/Output Summary (Last 24 hours) at 05/27/2020 1407 Last data filed at 05/27/2020 0700 Gross per 24 hour  Intake 535 ml  Output 1551 ml  Net -1016 ml   Physical Exam Constitutional:      General: He is not in acute distress.    Appearance: Normal appearance. He is not ill-appearing, toxic-appearing or diaphoretic.     Comments: Patient seen sitting in char, NAD. Engaged, Not tired appearing.   Cardiovascular:     Rate and Rhythm: Normal rate and regular rhythm.  Pulmonary:     Effort: Pulmonary effort is normal. No respiratory distress.  Abdominal:     General: There is no distension.     Palpations: Abdomen is soft.     Tenderness: There is no abdominal tenderness.  Musculoskeletal:        General: No swelling.     Comments: Appears Evolemic  Neurological:     Mental Status: He is alert.  Psychiatric:        Mood and Affect: Mood normal.        Thought Content: Thought content normal.      Assessment/Plan:  Resolved: Hyperkalemia elevated AGMA Hypocalcemia Hep C: Patient with hx of IV drug use. Reportshx of untreated Hep C.Hep C PCR negative this admission.    Principal Problem:   AKI (acute kidney injury) (HCC) Active Problems:   Endocarditis   Urinary retention   Hyperkalemia   Microcytic anemia   MRSA infection   Epidural abscess  Steven Cortezis a 44 y.o.malewith hx of IV drug use and untreated Hep Cwho was diagnosed with endocarditis and MRSA  bacteremia at [facility], left AMA then presented to [facility] a few days later. Also found to have large epidural abscess,s/p laminectomy and evacuation by neurosurgery on1/29. Symptoms and leukocytosis are improving with antibiotics.   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 Repeat TTE here shows 1 x 3 cm vegetation on the septal leaflet of the tricuspid valve, blood cultures here no growth to date. Fortunately, LVEF is preserved and TR is only mild at this time. CT surgeryconsultedfor evaluationand possible angiovac. MRI of the T/L-spinefound to have compressive epidural abscess from T11/12 - S2, and lumbar osteomyelitis.POD4from laminectomy andevacuationof epidural abscess. CT surgery desires TEE, in consideration of potentialangio VAC debridement. Blood cultures without growth.   -TEE scheduled for 12:30 PM tomorrow; f/u with CT surgery following results -Appreciate ID -continue daptomycin  -will need at least 8 weeks abx therapy -Appreciate Neurosurgery -activity as tolerated -Appreciate PT and OT  -No f/u recs  Thrombocytopenia Patient with mild thrombocytopenia 197 -->166-->131-->102 today. Notably resumed heparin prophylaxis three days ago following epidural abscess evacuation. Heparin with HD.   Patient had thrombocytopenia following starting anticoagulation at [facility] prior to this admission. Prior to this, patient reports has not been hospitalized in years, and to the best of his knowledge has never been on anticoagulation previously. Hgb stable at 7.5. Haptoglobin normal and LDH mildly elevated on 1/30. Patient also on daptomycin (which has potential to cause thrombocytopenia due to BM suppression), and linezolid  previously which can do same. 3 points on 4T (low risk)  -Trend CBC -f/u coag labs  -Consider heparin antibodies if intermediate risk per 4T   Acute renal failure  Hyponatremia Creatinine7.23 on admission, downtrending with recent of 1.75. Hyperkalemia resolved. Anuriahad been presenton admission, with good UOP following foley placement.Pyuria and hematuria present on urinalysis.Likely multifactorial, possibly some contribution of acute obstruction.Renal u/s significant for bladder distension and mild bilateral hydronephrosis.C3 low at 60, normal C4, but nephro holding off on biopsy as he would not be a candidate for immunosuppressionat this time. Renal function improving with dialysis and autodiuresis.  -Appreciate nephrology recommendations  -See if improves without HD  -Renvela -discontinued Ca carbonate 500 TID -discontinued Sodium Bicarbonate TID -Keep Foley in place at least 7 days, Foley caredaily per urology due to phimosis   Hypervolemia; resolved HTN Likely due to acute renal failure.TTEreassuring with preserved LVEF, normal CVP estimation by IVC. Renal functioning improving with diuresis.Near euvolemic. HTN persists. -Continue Monitoring UOP -Decreased Lasix to 40mg  BID per nephro -Amlodipine 5mg  started per nephro   Microcytic anemialikely 2/2 Chronic Disease Acute Anemia Hypo-proliferative per corrected retic count. Elevated ferritin, low iron and TIBC. Etiology of anemia likely multifactorial with prime component of anemia of chronic inflammation.Hgb initially stable following 1 unit pRBCson 1/28, but dropped to 7. Now S/p another unit on 2/1. Hgb remains stable at 7.5.  -Nephro started Aranesp    LOS: 6 days   , Medical Student 05/27/2020, 2:07 PM

## 2020-05-27 NOTE — Progress Notes (Addendum)
[facility] Progress Note    Assessment/ Plan:   Randie Houchinsis a/an 44 y.o.malewith a past medical history Hep C and IVDUwho present w/ likely endocarditis and ARF with electrolyte abnormalities.  He was recently admitted to [facility] but left AMA and presented 1/27 to [facility].   1. Severe AKI a. Patient presented with multiple electrolyte abnormalities and mild uremia and none I will you choreic.  Dialysis was initiated.  Differential for the AKI includes ATN, renal infarction from septic emboli although this is not on imaging, infection.  Urinalysis positive for WBCs, RBCs, protein.  EPC was not consistent with nephrotic syndrome.  Renal ultrasound showed mild hydronephrosis and a distended bladder.  Received his first dialysis on 1/30 and a second on 2/1.  On 1/31 patient produced 2.35 l of urine.  After dialysis on 2/1 the patient produced 2.55 L of urine with a net deficit since admission of 8.428 L since admission.  Creatinine improving with creatinine on 2/1 of 1.91 from 2.85 on 1/31.  Creatinine today improved to 1.75 with BUN also improving to 84.  He did receive dialysis yesterday.  Albumin of 1.8. b. No dialysis scheduled for today, we will continue to monitor daily creatinine to guide need for further dialysis- hopefully with inc UOP and relatively good kidney numbers after HD- I am hoping he will no longer need  c. Continue Foley catheter at this time d. Dose meds for GFR e. Strict I's and O's, daily weights f. Maintain a MAP greater than 65 for renal perfusion g. Avoid nephrotoxic agents  2. Hyperkalemia secondary to AKI, now resolved 3. Hyperphosphatemia a. Phosphate of 6.7 on 2/1, he was started on Renvela, phosphate improved to 5.5 on 2/2. b. Daily BMP c. Continue Renvela 4. Metabolic acidosis-improved, stopping oral bicarb 5. Hypocalcemia a. Corrects to 9.6 b. Continue calcium carbonate 500 3 times daily 6. Hepatitis C a. Not treated b. Recommend PCR to  determine viral load and then consider an ID consult 7. Hyponatremia a. Normal sodium of 135 today 2/2 b. Morning BMPs 8. 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 Antibiotics per primary team and ID 9. PE a. Patient had an abnormal VQ scan and the hemoptysis b. Currently on heparin GTT  10. Hypertension a. Likely due to volume overload, blood pressures over the last 24 hours have ranged from 147/76-181/100 with most recent being 168/74.  Will likely continue to improve with continued diuresis and hemodialysis b. Decreased Lasix to 40 mg IV twice daily c. Added amlodipine 5 mg daily 11. Anemia a. It was 7.8 after transfusion on 2/1 b. Transfusion threshold of 7, continue to monitor and transfuse as needed c. ESA given  Patient seen and examined, agree with above note with above modifications. The great news is that his UOP is picking up and crt is good in the 1's after HD yesterday-  Will monitor renal function and UOP -  I am hopeful he will not need additional HD -  Have decreased lasix as does not seem overloaded-  Add amlodipine for BP above goal  Annie Sable, MD 05/27/2020      Subjective:   Patient is doing well today, reports that he feels better today and is having no pain.  Patient reports he is continuing to have great urinary output.  Looks a little brighter    Objective:   BP (!) 168/74 (BP Location: Left Arm)   Pulse 84   Temp 98.4 F (36.9 C) (Oral)  Resp 18   Ht 5\' 10"  (1.778 m)   Wt 79.1 kg   SpO2 98%   BMI 25.01 kg/m   Intake/Output Summary (Last 24 hours) at 05/27/2020 1240 Last data filed at 05/27/2020 0700 Gross per 24 hour  Intake 885 ml  Output 1551 ml  Net -666 ml   Weight change: 0.9 kg  Physical Exam: Gen: Well-appearing, resting comfortably in chair CVS: Regular rate and rhythm, no murmurs appreciated Resp: Normal work of breathing, lungs clear to auscultation Abd: Soft, nontender, positive bowel sounds Ext: No  lower extremity edema noted, scab on right calf which does not appear infected Dialysis access: RIJ temp HD catheter-in for 5 days at this time  Imaging: No results found.  Labs: BMET Recent Labs  Lab 05/21/20 1850 05/22/20 0226 05/22/20 2118 05/23/20 0650 05/24/20 0203 05/25/20 0451 05/26/20 0600 05/27/20 1008  NA 127* 129* 130* 134* 133* 136 134* 135  K 5.8* 5.7* 5.3* 4.9 5.2* 4.6 4.2 4.0  CL 98 99 100 104 102 101 99 100  CO2 12* 12* 13* 15* 15* 21* 23 26  GLUCOSE 147* 115* 109* 92 149* 110* 97 105*  BUN 184* 184* 171* 165* 177* 136* 123* 84*  CREATININE 6.56* 6.09* 4.81* 4.11* 4.16* 2.85* 1.91* 1.75*  CALCIUM 6.7* 6.5* 6.5* 6.9* 7.5* 7.8* 7.7* 7.8*  PHOS >30.0*  --   --  11.9* >30.0* 9.2* 6.7* 5.5*   CBC Recent Labs  Lab 05/21/20 1850 05/22/20 0226 05/24/20 0203 05/25/20 0451 05/26/20 0600 05/26/20 1637 05/27/20 1008  WBC 15.9*   < > 16.5* 18.0* 12.9*  --  10.1  NEUTROABS 14.0*  --   --   --   --   --   --   HGB 7.3*   < > 8.0* 7.5* 7.0* 7.8* 7.5*  HCT 21.8*   < > 22.2* 22.2* 20.2* 22.9* 21.8*  MCV 78.4*   < > 77.9* 81.0 81.1  --  83.8  PLT 154   < > 197 166 131*  --  102*   < > = values in this interval not displayed.    Medications:    . sodium chloride   Intravenous Once  . amLODipine  5 mg Oral Daily  . Chlorhexidine Gluconate Cloth  6 each Topical Q0600  . darbepoetin (ARANESP) injection - DIALYSIS  150 mcg Intravenous Q Tue-HD  . furosemide  40 mg Oral BID  . heparin  5,000 Units Subcutaneous Q8H  . mupirocin ointment  1 application Nasal BID  . sevelamer carbonate  1,600 mg Oral TID WC     07/25/20, MD [facility] Family Medicine Resident, PGY2 05/27/2020, 12:40 PM

## 2020-05-27 NOTE — H&P (View-Only) (Signed)
Subjective: Patient reports pain improved overnight with antacids. No longer having pain. Denies chest pain and sob.   Objective:  Vital signs in last 24 hours: Vitals:   05/26/20 2315 05/27/20 0333 05/27/20 0850 05/27/20 1254  BP: (!) 159/100 (!) 147/76 (!) 168/74 (!) 168/90  Pulse: 85 (!) 43 84 81  Resp: 20 18 18 18   Temp: 98.2 F (36.8 C) 98.4 F (36.9 C)  98 F (36.7 C)  TempSrc: Oral Oral  Oral  SpO2: 96% 97% 98% 98%  Weight:  79.1 kg    Height:       Weight change: 0.9 kg  Intake/Output Summary (Last 24 hours) at 05/27/2020 1407 Last data filed at 05/27/2020 0700 Gross per 24 hour  Intake 535 ml  Output 1551 ml  Net -1016 ml   Physical Exam Constitutional:      General: He is not in acute distress.    Appearance: Normal appearance. He is not ill-appearing, toxic-appearing or diaphoretic.     Comments: Patient seen sitting in char, NAD. Engaged, Not tired appearing.   Cardiovascular:     Rate and Rhythm: Normal rate and regular rhythm.  Pulmonary:     Effort: Pulmonary effort is normal. No respiratory distress.  Abdominal:     General: There is no distension.     Palpations: Abdomen is soft.     Tenderness: There is no abdominal tenderness.  Musculoskeletal:        General: No swelling.     Comments: Appears Evolemic  Neurological:     Mental Status: He is alert.  Psychiatric:        Mood and Affect: Mood normal.        Thought Content: Thought content normal.      Assessment/Plan:  Resolved: Hyperkalemia elevated AGMA Hypocalcemia Hep C: Patient with hx of IV drug use. Reportshx of untreated Hep C.Hep C PCR negative this admission.    Principal Problem:   AKI (acute kidney injury) (HCC) Active Problems:   Endocarditis   Urinary retention   Hyperkalemia   Microcytic anemia   MRSA infection   Epidural abscess  Rhett Houchinsis a 45 y.o.malewith hx of IV drug use and untreated Hep Cwho was diagnosed with endocarditis and MRSA  bacteremia at [facility], left AMA then presented to [facility] a few days later. Also found to have large epidural abscess,s/p laminectomy and evacuation by neurosurgery on1/29. Symptoms and leukocytosis are improving with antibiotics.   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 Repeat TTE here shows 1 x 3 cm vegetation on the septal leaflet of the tricuspid valve, blood cultures here no growth to date. Fortunately, LVEF is preserved and TR is only mild at this time. CT surgeryconsultedfor evaluationand possible angiovac. MRI of the T/L-spinefound to have compressive epidural abscess from T11/12 - S2, and lumbar osteomyelitis.POD31from laminectomy andevacuationof epidural abscess. CT surgery desires TEE, in consideration of potentialangio VAC debridement. Blood cultures without growth.   -TEE scheduled for 12:30 PM tomorrow; f/u with CT surgery following results -Appreciate ID -continue daptomycin  -will need at least 8 weeks abx therapy -Appreciate Neurosurgery -activity as tolerated -Appreciate PT and OT  -No f/u recs  Thrombocytopenia Patient with mild thrombocytopenia 197 -->166-->131-->102 today. Notably resumed heparin prophylaxis three days ago following epidural abscess evacuation. Heparin with HD.  Patient had thrombocytopenia following starting anticoagulation at [facility] prior to this admission. Prior to this, patient reports has not been hospitalized in years, and to the best of his knowledge has never been on anticoagulation previously. Hgb stable at 7.5. Haptoglobin normal and LDH mildly elevated on 1/30. Patient also on daptomycin (which has potential to cause thrombocytopenia due to BM suppression), and linezolid  previously which can do same. 3 points on 4T (low risk)  -Trend CBC -f/u coag labs  -Consider heparin antibodies if intermediate risk per 4T   Acute renal failure  Hyponatremia Creatinine7.23 on admission, downtrending with recent of 1.75. Hyperkalemia resolved. Anuriahad been presenton admission, with good UOP following foley placement.Pyuria and hematuria present on urinalysis.Likely multifactorial, possibly some contribution of acute obstruction.Renal u/s significant for bladder distension and mild bilateral hydronephrosis.C3 low at 60, normal C4, but nephro holding off on biopsy as he would not be a candidate for immunosuppressionat this time. Renal function improving with dialysis and autodiuresis.  -Appreciate nephrology recommendations  -See if improves without HD  -Renvela -discontinued Ca carbonate 500 TID -discontinued Sodium Bicarbonate TID -Keep Foley in place at least 7 days, Foley caredaily per urology due to phimosis   Hypervolemia; resolved HTN Likely due to acute renal failure.TTEreassuring with preserved LVEF, normal CVP estimation by IVC. Renal functioning improving with diuresis.Near euvolemic. HTN persists. -Continue Monitoring UOP -Decreased Lasix to 40mg  BID per nephro -Amlodipine 5mg  started per nephro   Microcytic anemialikely 2/2 Chronic Disease Acute Anemia Hypo-proliferative per corrected retic count. Elevated ferritin, low iron and TIBC. Etiology of anemia likely multifactorial with prime component of anemia of chronic inflammation.Hgb initially stable following 1 unit pRBCson 1/28, but dropped to 7. Now S/p another unit on 2/1. Hgb remains stable at 7.5.  -Nephro started Aranesp    LOS: 6 days   , Medical Student 05/27/2020, 2:07 PM

## 2020-05-27 NOTE — Progress Notes (Signed)
Occupational Therapy Treatment Patient Details Name: Steven Cortez MRN: [id number] DOB: [birth year] Today's Date: 05/27/2020    History of present illness Pt is a 45 year old male with PMHx including Hep C and IVDU who presented with back pain, weakness, dyspnea, pleuritic chest pain, urinary retention, and pitting edema. He was recently admitted to [facility] and found to have tricuspid endocarditis with MRSA bacteremia, and pulmonary septic emboli but left AMA. 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   OT comments  Pt progressing well towards acute OT goals. Supervision level walking in the room. RW utilized per pt's preference. Pt able to place each foot on opposite knee for access during LB ADLs. D/c plan remains appropriate.    Follow Up Recommendations  No OT follow up;Supervision - Intermittent    Equipment Recommendations  3 in 1 bedside commode    Recommendations for Other Services      Precautions / Restrictions Precautions Precautions: Fall Precaution Comments: utilized back precautions for comfort Restrictions Weight Bearing Restrictions: No       Mobility Bed Mobility               General bed mobility comments: OOB in chair  Transfers Overall transfer level: Needs assistance Equipment used: Rolling walker (2 wheeled) Transfers: Sit to/from Stand Sit to Stand: Supervision         General transfer comment: to/from recliner and regular height seat. supervision for safety    Balance Overall balance assessment: Needs assistance Sitting-balance support: Feet supported Sitting balance-Leahy Scale: Good     Standing balance support: Bilateral upper extremity supported Standing balance-Leahy Scale: Fair Standing balance comment: Pt prefers to utilizes rw with mobility at this time.                            ADL either performed or assessed with clinical judgement   ADL Overall ADL's : Needs assistance/impaired                         Toilet Transfer: Supervision/safety;Ambulation;Regular Toilet           Functional mobility during ADLs: Supervision/safety;Rolling walker General ADL Comments: Pt completed functional mobility in the room, practiced functional transfers. Able to raise each foot to opposite knee for LB ADLs.     Vision       Perception     Praxis      Cognition Arousal/Alertness: Awake/alert Behavior During Therapy: WFL for tasks assessed/performed Overall Cognitive Status: Within Functional Limits for tasks assessed                                          Exercises Exercises: General Lower Extremity General Exercises - Lower Extremity Long Arc Quad: Both;10 reps;Seated Hip Flexion/Marching: Both;10 reps;Seated   Shoulder Instructions       General Comments      Pertinent Vitals/ Pain       Pain Assessment: Faces Faces Pain Scale: Hurts a little bit Pain Location: back with movement Pain Descriptors / Indicators: Discomfort Pain Intervention(s): Monitored during session  Home Living  Prior Functioning/Environment              Frequency  Min 2X/week        Progress Toward Goals  OT Goals(current goals can now be found in the care plan section)  Progress towards OT goals: Progressing toward goals  Acute Rehab OT Goals Patient Stated Goal: home, back to independent OT Goal Formulation: With patient Time For Goal Achievement: 06/07/20 Potential to Achieve Goals: Good ADL Goals Pt Will Perform Grooming: with modified independence;standing Pt Will Perform Lower Body Bathing: with modified independence;sit to/from stand;with adaptive equipment Pt Will Perform Upper Body Dressing: with modified independence;sitting Pt Will Perform Lower Body  Dressing: with modified independence;sit to/from stand;with adaptive equipment Pt Will Transfer to Toilet: with modified independence;ambulating Pt Will Perform Toileting - Clothing Manipulation and hygiene: with modified independence;sit to/from stand Additional ADL Goal #1: Pt will perform bed mobility at mod independent level as precursor to EOB/OOB ADL.  Plan Discharge plan remains appropriate    Co-evaluation                 AM-PAC OT "6 Clicks" Daily Activity     Outcome Measure   Help from another person eating meals?: None Help from another person taking care of personal grooming?: None Help from another person toileting, which includes using toliet, bedpan, or urinal?: A Little Help from another person bathing (including washing, rinsing, drying)?: A Little Help from another person to put on and taking off regular upper body clothing?: None Help from another person to put on and taking off regular lower body clothing?: None 6 Click Score: 22    End of Session Equipment Utilized During Treatment: Rolling walker  OT Visit Diagnosis: Other abnormalities of gait and mobility (R26.89);Muscle weakness (generalized) (M62.81);Pain   Activity Tolerance Patient tolerated treatment well   Patient Left in chair;with call bell/phone within reach   Nurse Communication Mobility status        Time: 0921-0932 OT Time Calculation (min): 11 min  Charges: OT General Charges $OT Visit: 1 Visit OT Treatments $Self Care/Home Management : 8-22 mins  Raynald Kemp, OT Acute Rehabilitation Services Pager: [phone] Office: [phone]    Pilar Grammes 05/27/2020, 11:36 AM

## 2020-05-28 ENCOUNTER — Inpatient Hospital Stay (HOSPITAL_COMMUNITY): Payer: Medicaid Other

## 2020-05-28 ENCOUNTER — Inpatient Hospital Stay (HOSPITAL_COMMUNITY): Payer: Medicaid Other | Admitting: Certified Registered Nurse Anesthetist

## 2020-05-28 ENCOUNTER — Encounter (HOSPITAL_COMMUNITY)
Admission: EM | Disposition: A | Discharge status: Home / Self Care | Payer: Self-pay | Source: Home / Self Care | Attending: Internal Medicine

## 2020-05-28 ENCOUNTER — Encounter (HOSPITAL_COMMUNITY): Payer: Self-pay | Admitting: Internal Medicine

## 2020-05-28 DIAGNOSIS — I38 Endocarditis, valve unspecified: Secondary | ICD-10-CM

## 2020-05-28 DIAGNOSIS — I361 Nonrheumatic tricuspid (valve) insufficiency: Secondary | ICD-10-CM

## 2020-05-28 HISTORY — PX: TEE WITHOUT CARDIOVERSION: SHX5443

## 2020-05-28 HISTORY — PX: BUBBLE STUDY: SHX6837

## 2020-05-28 LAB — RENAL FUNCTION PANEL
Albumin: 1.7 g/dL — ABNORMAL LOW (ref 3.5–5.0)
Anion gap: 9 (ref 5–15)
BUN: 78 mg/dL — ABNORMAL HIGH (ref 6–20)
CO2: 25 mmol/L (ref 22–32)
Calcium: 7.9 mg/dL — ABNORMAL LOW (ref 8.9–10.3)
Chloride: 101 mmol/L (ref 98–111)
Creatinine, Ser: 1.65 mg/dL — ABNORMAL HIGH (ref 0.61–1.24)
GFR, Estimated: 52 mL/min — ABNORMAL LOW (ref >60)
Glucose, Bld: 98 mg/dL (ref 70–99)
Phosphorus: 4.7 mg/dL — ABNORMAL HIGH (ref 2.5–4.6)
Potassium: 4 mmol/L (ref 3.5–5.1)
Sodium: 135 mmol/L (ref 135–145)

## 2020-05-28 LAB — GLUCOSE, CAPILLARY: Glucose-Capillary: 169 mg/dL — ABNORMAL HIGH (ref 70–99)

## 2020-05-28 LAB — CBC
HCT: 22.4 % — ABNORMAL LOW (ref 39.0–52.0)
Hemoglobin: 7.5 g/dL — ABNORMAL LOW (ref 13.0–17.0)
MCH: 28.5 pg (ref 26.0–34.0)
MCHC: 33.5 g/dL (ref 30.0–36.0)
MCV: 85.2 fL (ref 80.0–100.0)
Platelets: 111 10*3/uL — ABNORMAL LOW (ref 150–400)
RBC: 2.63 MIL/uL — ABNORMAL LOW (ref 4.22–5.81)
RDW: 15.8 % — ABNORMAL HIGH (ref 11.5–15.5)
WBC: 12.1 10*3/uL — ABNORMAL HIGH (ref 4.0–10.5)
nRBC: 0 % (ref 0.0–0.2)

## 2020-05-28 LAB — AEROBIC/ANAEROBIC CULTURE W GRAM STAIN (SURGICAL/DEEP WOUND)

## 2020-05-28 LAB — PATHOLOGIST SMEAR REVIEW

## 2020-05-28 IMAGING — CT CT HIP*R* W/O CM
2 of 4 series · 17 of 46 positions shown, 19 images · non-contrast
Comparison: [DATE]

CLINICAL DATA: History of septic emboli, severe right pain

EXAM:
CT OF THE RIGHT HIP WITHOUT CONTRAST
TECHNIQUE: Multidetector CT imaging of the right hip was performed according to
the standard protocol. Multiplanar CT image reconstructions were
also generated.

[Series 5: pelvis thin · axial · 0.44mm/px · z∈[-333,-175]mm · 14 of 287 slices shown, 16 images]
[im 12/287  soft-tissue]
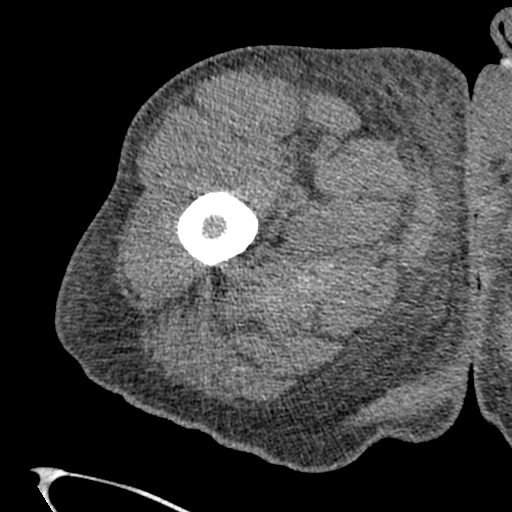
[im 12/287  bone]
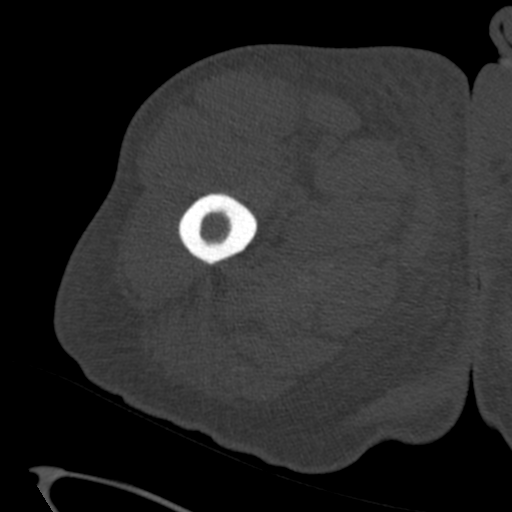
[im 36/287  soft-tissue]
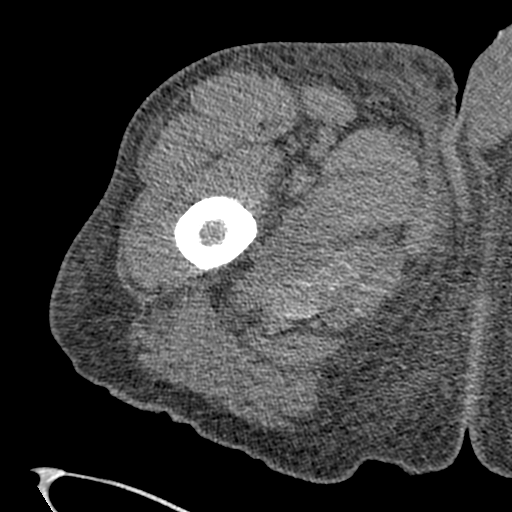
[im 60/287  soft-tissue]
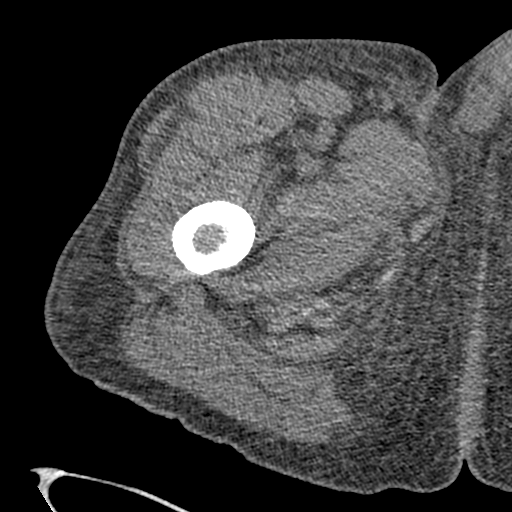
[im 72/287  soft-tissue]
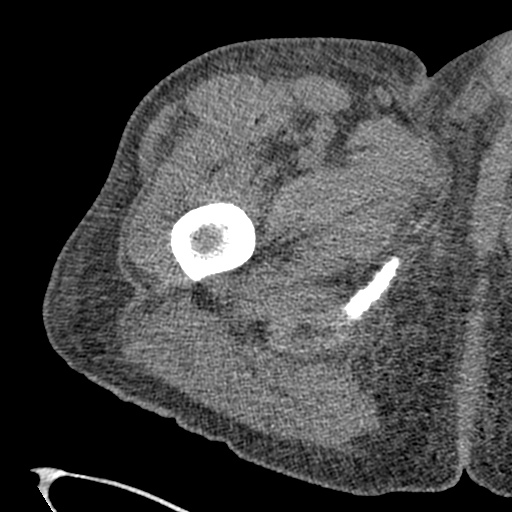
[im 96/287  soft-tissue]
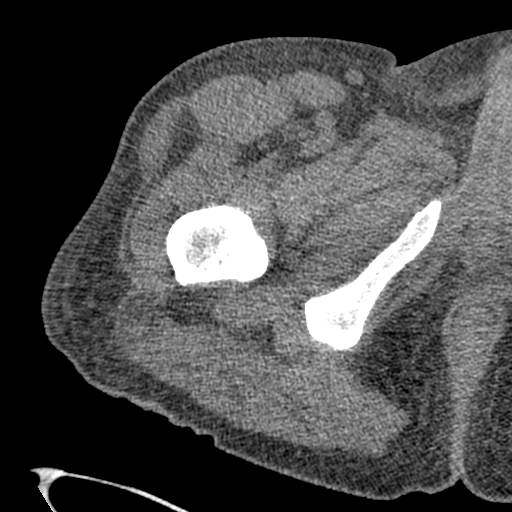
[im 120/287  soft-tissue]
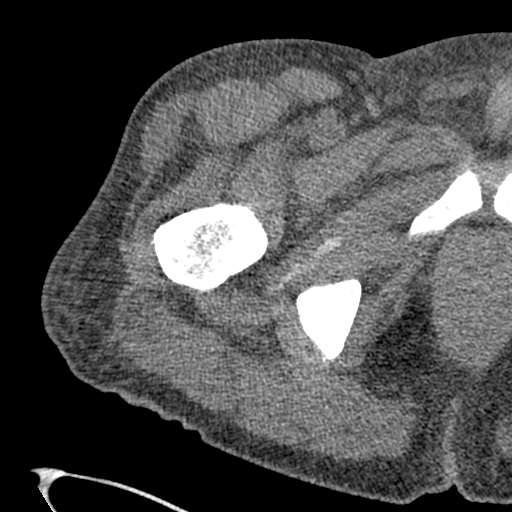
[im 132/287  soft-tissue]
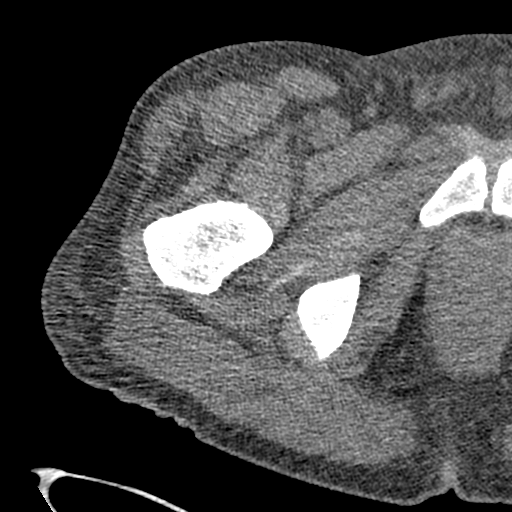
[im 155/287  soft-tissue]
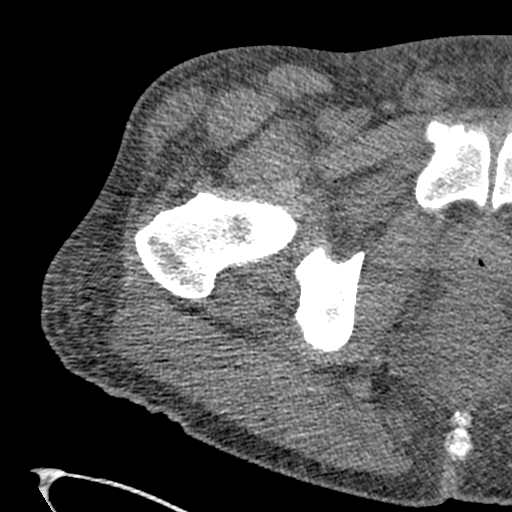
[im 167/287  soft-tissue]
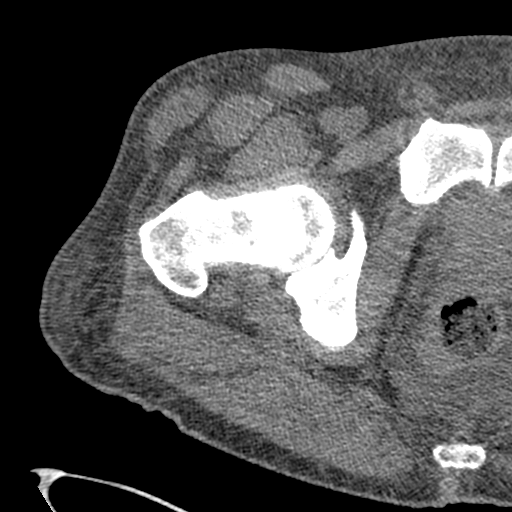
[im 167/287  bone]
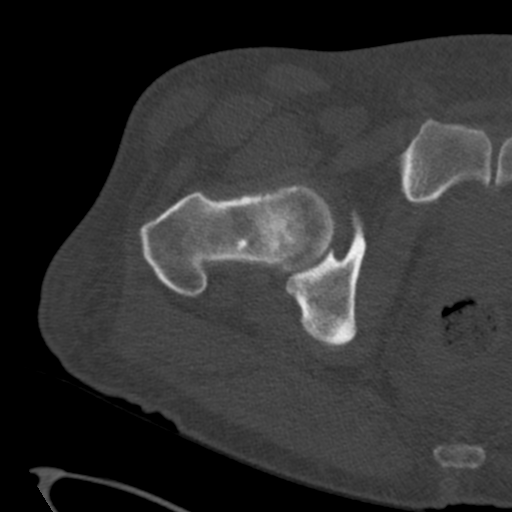
[im 191/287  soft-tissue]
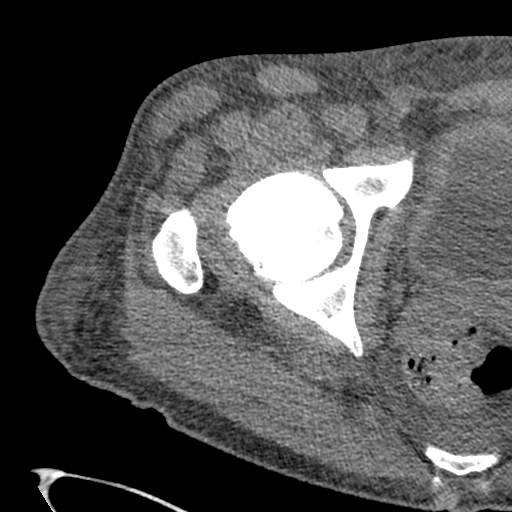
[im 215/287  soft-tissue]
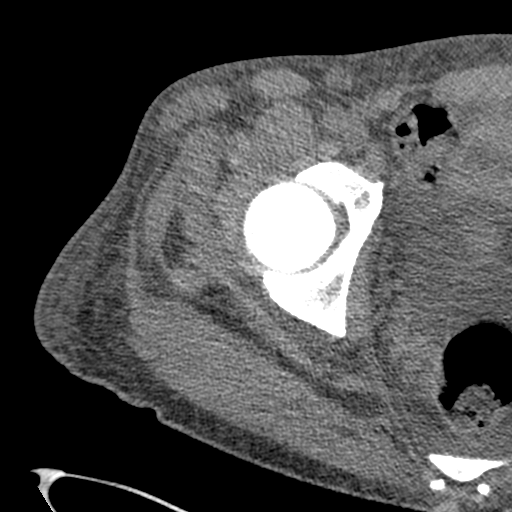
[im 227/287  soft-tissue]
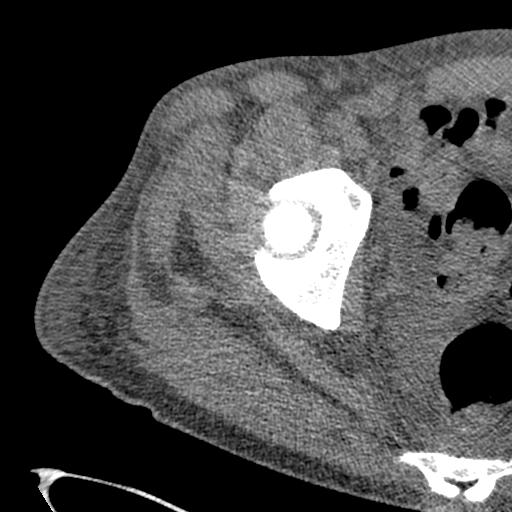
[im 251/287  soft-tissue]
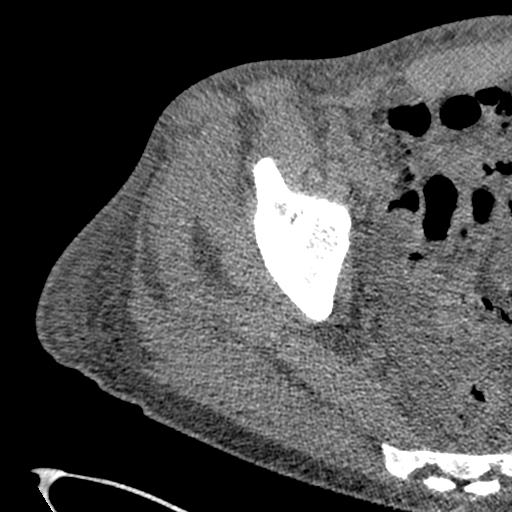
[im 275/287  soft-tissue]
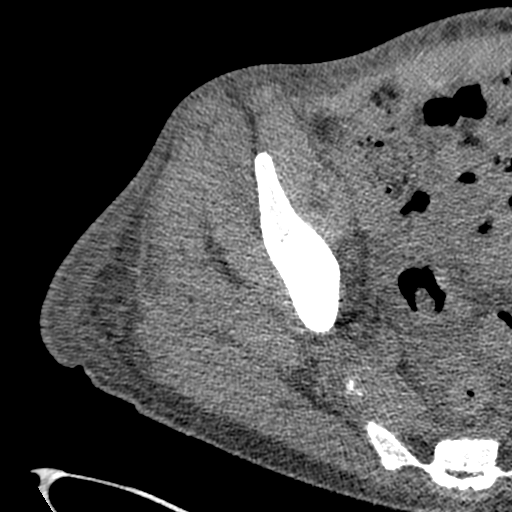

[Series 9: coronal st · coronal · 0.38mm/px · 3 of 106 slices shown]
[im 36/106  soft-tissue]
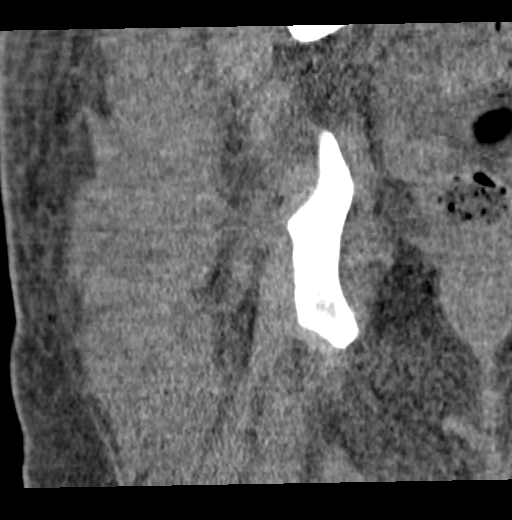
[im 47/106  soft-tissue]
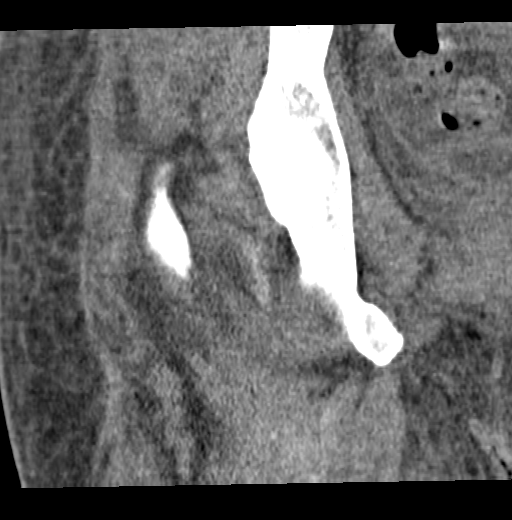
[im 59/106  soft-tissue]
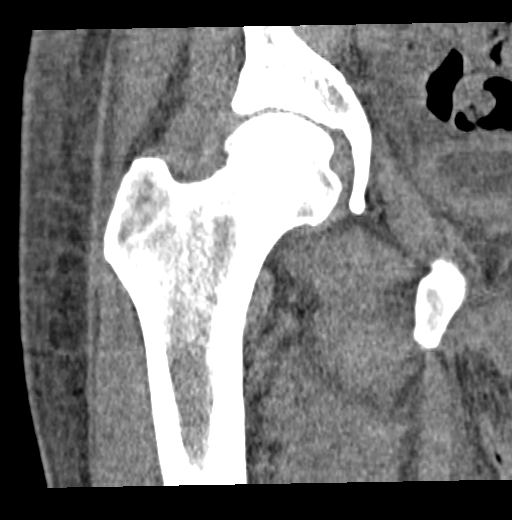

[17 of 46 positions shown; findings below may reference images not displayed]

FINDINGS: Bones/Joint/Cartilage

No fracture or dislocation. No areas of cortical destruction or
periosteal reaction. There is a small femoroacetabular joint
effusion present.

Ligaments

Suboptimally assessed by CT.

Muscles and Tendons

Within the obturator externus and adductor musculature there appears
to be hyperdense area with enlargement. The visualized portion of
the tendons appear to be intact.

Soft tissues

Diffuse subcutaneous edema thickening seen the hip. There is
scattered lymph nodes seen within the right inguinal region.
IMPRESSION: No acute osseous abnormality.

Hyperdense areas within the obturator externus and adductor
musculature which represent hematoma/intramuscular bleed.

Small hip joint effusion

Diffuse subcutaneous edema, likely from cellulitis.

## 2020-05-28 SURGERY — ECHOCARDIOGRAM, TRANSESOPHAGEAL
Anesthesia: Monitor Anesthesia Care

## 2020-05-28 MED ORDER — PROPOFOL 10 MG/ML IV BOLUS
INTRAVENOUS | Status: DC | PRN
  Administered 2020-05-28: 20 mg via INTRAVENOUS
  Administered 2020-05-28: 30 mg via INTRAVENOUS
  Administered 2020-05-28: 20 mg via INTRAVENOUS
  Administered 2020-05-28: 30 mg via INTRAVENOUS

## 2020-05-28 MED ORDER — PROPOFOL 500 MG/50ML IV EMUL
INTRAVENOUS | Status: DC | PRN
  Administered 2020-05-28: 250 ug/kg/min via INTRAVENOUS
  Administered 2020-05-28: 150 ug/kg/min via INTRAVENOUS

## 2020-05-28 MED ORDER — SODIUM CHLORIDE 0.9 % IV SOLN: INTRAVENOUS | Status: DC | PRN

## 2020-05-28 MED ORDER — CHLORHEXIDINE GLUCONATE CLOTH 2 % EX PADS
6.0000 | MEDICATED_PAD | Freq: Every day | CUTANEOUS | Status: DC
  Administered 2020-05-28 – 2020-07-15 (×45): 6 via TOPICAL

## 2020-05-28 MED ORDER — SODIUM CHLORIDE 0.9 % IV SOLN
670.0000 mg | Freq: Every day | INTRAVENOUS | Status: DC
  Administered 2020-05-28 – 2020-06-13 (×17): 670 mg via INTRAVENOUS
  Filled 2020-05-28 (×23): qty 13.4

## 2020-05-28 MED ORDER — BUTAMBEN-TETRACAINE-BENZOCAINE 2-2-14 % EX AERO
INHALATION_SPRAY | CUTANEOUS | Status: DC | PRN
  Administered 2020-05-28: 2 via TOPICAL

## 2020-05-28 NOTE — Progress Notes (Signed)
Subjective:  Patient seen post-TEE. Reports doing well; denies throat and chest pain. Reports right hip tenderness laterally starting 1-2 days ago of unchanged intensity associated with stabbing back pain that worsens when he leans back. No change to pain with walking. No pain on left. Conveys pain well controlled on pain regimen. Good ambulation, and endorses improved strength. Discussed known TEE findings at this time and CT Surgery to evaluate.    Objective:  Vital signs in last 24 hours: Vitals:   05/28/20 1041 05/28/20 1238 05/28/20 1240 05/28/20 1246  BP: (!) 189/92 136/77  (!) 147/82  Pulse: 80 89 87 90  Resp: 19 (!) 23 (!) 23 (!) 22  Temp: 99.5 F (37.5 C)     TempSrc: Temporal     SpO2: 97% 98% 98% 97%  Weight:      Height:       Weight change: -2.475 kg  Intake/Output Summary (Last 24 hours) at 05/28/2020 1247 Last data filed at 05/28/2020 1237 Gross per 24 hour  Intake 780 ml  Output 2250 ml  Net -1470 ml   Physical Exam Constitutional:      General: He is not in acute distress.    Appearance: He is not toxic-appearing or diaphoretic.     Comments: Patient seen comfortably lying in bed, NAD. Alert and conversant.   Cardiovascular:     Rate and Rhythm: Normal rate and regular rhythm.     Heart sounds: Murmur heard.    Pulmonary:     Effort: Pulmonary effort is normal.     Breath sounds: Wheezing (mild scattered wheezes) present.  Musculoskeletal:        General: No swelling. Normal range of motion.     Comments: Normal lower extremity ROM. No pain on leg raise bilaterally. Pain with leaning backwards.   Neurological:     Mental Status: He is alert.  Psychiatric:        Mood and Affect: Mood normal.      Assessment/Plan:  Resolved: Hyperkalemia elevated AGMA Hypocalcemia Hep C:Patient with hx of IV drug use. Reportshx of untreated Hep C.Hep C PCR negativethis admission.   Principal Problem:   AKI (acute kidney injury) (HCC) Active  Problems:   Endocarditis   Urinary retention   Hyperkalemia   Microcytic anemia   MRSA infection   Epidural abscess   Steven Cortez a 44 y.o.malewith hx of IV drug use and untreated Hep Cwho was diagnosed with endocarditis and MRSA bacteremia at [facility], left AMA then presented to [facility] a few days later. Also found to have large epidural abscess,s/p laminectomy and evacuation by neurosurgery on1/29. Symptoms and leukocytosis are improving with antibiotics.   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 for MRSA bacteremia and tricuspid valve endocarditis, but left AMA few days before admission here. Repeat TTE here shows 1 x 3 cm vegetation on the septal leaflet of the tricuspid valve. Blood cultures without growth. S/p laminectomy andevacuationof epidural abscess on 1/29. CT surgery desired TEE,in consideration of potentialangio VAC debridement. TEE performed today.   -f/u formal TEE results; f/u with CT surgery when resulted -Appreciate ID  -continue daptomycin  -will need at least 8 weeks abx therapy -Appreciate Neurosurgery -activity as tolerated -Appreciate PT and OT  -No f/u recs   Back pain  Patient s/p epidural abscess evacuation on 1/29 following consistent Thoracolumbar MRI. Reporting new onset (starting 1-2 days  ago) unchanging, intermittent right hip tenderness with stabbing back pain that worsens with backward leaning; and is unchanged by ambulation. Denies left-sided pain. Strength has been improving. Unilateral tenderness may be due to positional lie considering time spent in bed.  -consider right hip imaging if progresses   Thrombocytopenia Patient had gradual drop in platelet count 197 -->166-->131-->102. True thrombocytopenia per tech smear report. Increase in platelet count to 111 today. Notably  resumed heparin prophylaxis four days ago following epidural abscess evacuation. Heparin with HD. Patient had thrombocytopenia following starting anticoagulation at [facility] prior to this admission. Prior to this, patient reports has not been hospitalized in years, and to the best of his knowledge has never been on anticoagulation previously. Hgb stable. Haptoglobin normal and LDH only mildly elevated on 1/30. Patient also on daptomycin (which has potential to cause thrombocytopenia due to BM suppression), and linezolid previously which can do same. Low risk per 4T.   -Trend CBC   Acute renal failure  Hyponatremia Hypervolemia; resolved Creatinine7.23 on admission, downtrending with recent of1.65.Hyperkalemia resolved. Anuriahad been presenton admission, with good UOP following foley placement.Pyuria and hematuria present on urinalysis.Likely multifactorial, with some contribution of initial acute obstruction.Renal u/s significant for bladder distension and mild bilateral hydronephrosis.C3 low at 60, normal C4, but nephro holding off on biopsy as he would not be a candidate for immunosuppressionat this time.Renal function Improved with dialysis and autodiuresis.   -Appreciate nephrology recommendations -Keep temp line in for at-least one more day  -take foley out today -Stopped Renvela  -Stopped Lasix -discontinuedCa carbonate 500 TID -discontinuedSodium Bicarbonate TID   HTN Component of acute renal failure. Renal functioning improved.Patient now euvolemic.HTN persists, though improving.   -Continue Amlodipine 5mg  per nephro   Microcytic anemialikely 2/2 Chronic Disease Acute Anemia Hypo-proliferative per corrected retic count. Elevated ferritin, low iron and TIBC. Etiology of anemia likely multifactorial with prime component of anemia of chronic inflammation.Hgbinitiallystable following 1 unit pRBCson 1/28, but dropped to 7. Now S/p another unit on 2/1. Hgb  remains stable.  -Aranesp per Nephro    LOS: 7 days   2/28, Medical Student 05/28/2020, 12:47 PM

## 2020-05-28 NOTE — Progress Notes (Signed)
PHARMACY NOTE:  ANTIMICROBIAL RENAL DOSAGE ADJUSTMENT  Current antimicrobial regimen includes a mismatch between antimicrobial dosage and estimated renal function.  As per policy approved by the Pharmacy & Therapeutics and Medical Executive Committees, the antimicrobial dosage will be adjusted accordingly.  Current antimicrobial dosage:  Daptomycin 670mg  IV q48h  Indication: Endocarditis   Renal Function:  Estimated Creatinine Clearance: 59 mL/min (A) (by C-G formula based on SCr of 1.65 mg/dL (H)). []      On intermittent HD, scheduled: []      On CRRT    Antimicrobial dosage has been changed to:  Daptoymcin 670mg  IV q24h   Patient has not required HD since 2/1 and UOP is improved with net negative 1.2L on 2/2. Will increase back to Q24H dosing for now and monitor for continued HD per nephrology. Will sign off consult and continue to monitor renal function for needed dose adjustments.   Thank you for allowing pharmacy to be a part of this patient's care.  , PharmD, MBA Pharmacy Resident [phone] 05/28/2020 8:43 AM

## 2020-05-28 NOTE — Progress Notes (Signed)
[facility]    Date of Admission:  05/21/2020   Total days of antibiotics 2           ID: Steven Cortez is a 45 y.o. male with   Principal Problem:   AKI (acute kidney injury) (HCC) Active Problems:   Endocarditis   Urinary retention   Hyperkalemia   Microcytic anemia   MRSA infection   Epidural abscess    Subjective: Afebrile Being taken to TEE not available for exam  Medications:  . sodium chloride   Intravenous Once  . amLODipine  5 mg Oral Daily  . Chlorhexidine Gluconate Cloth  6 each Topical Daily  . darbepoetin (ARANESP) injection - DIALYSIS  150 mcg Intravenous Q Tue-HD  . furosemide  40 mg Oral BID  . heparin  5,000 Units Subcutaneous Q8H  . sevelamer carbonate  1,600 mg Oral TID WC    Objective: Vital signs in last 24 hours: Temp:  [97.7 F (36.5 C)-99 F (37.2 C)] 97.7 F (36.5 C) (02/03 0834) Pulse Rate:  [41-81] 73 (02/03 0834) Resp:  [18-23] 18 (02/03 0849) BP: (162-181)/(90-95) 180/94 (02/03 0834) SpO2:  [95 %-98 %] 96 % (02/03 0834) Weight:  [79.4 kg] 79.4 kg (02/03 0058)  Physical Exam  Constitutional: well developed, no distress, conversant; beign taken to TEE HEENT: atraumatic Lungs: normal respiratory effort  Lab Results Recent Labs    05/27/20 1008 05/28/20 0500  WBC 10.1 12.1*  HGB 7.5* 7.5*  HCT 21.8* 22.4*  NA 135 135  K 4.0 4.0  CL 100 101  CO2 26 25  BUN 84* 78*  CREATININE 1.75* 1.65*   Liver Panel Recent Labs    05/27/20 1008 05/28/20 0500  PROT 6.0*  --   ALBUMIN 1.7*  1.8* 1.7*  AST 23  --   ALT 22  --   ALKPHOS 186*  --   BILITOT 0.8  --   BILIDIR 0.2  --   IBILI 0.6  --    Sedimentation Rate No results for input(s): ESRSEDRATE in the last 72 hours. C-Reactive Protein No results for input(s): CRP in the last 72 hours.  Microbiology: 1/29 surgical lumbar spine tissue cx mrsa (S tetracycline, bactrim) 1/27 bcx negative  Serology: 05/25/19 hep b core ab, surface Ab  reactive; sAg negative 05/22/19 hep c rna pcr negative  Imaging: 1/28 tte 1. Left ventricular ejection fraction, by estimation, is 60 to 65%. The  left ventricle has normal function. The left ventricle has no regional  wall motion abnormalities. There is mild left ventricular hypertrophy.  Left ventricular diastolic parameters  were normal.  2. Right ventricular systolic function is normal. The right ventricular  size is normal.  3. The mitral valve is normal in structure. No evidence of mitral valve  regurgitation.  4. Large mobile echogenic mass noted on the RV side of the tricuspid  valve septal leaflet, measuring 1 x 3 cm, suggestive of vegetation. The  tricuspid valve is abnormal.  5. The aortic valve is tricuspid. Aortic valve regurgitation is not  visualized.  6. The inferior vena cava is normal in size with greater than 50%  respiratory variability, suggesting right atrial pressure of 3 mmHg.    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 Epidural abscess compresses the thecal sac from T11-12 to the level of S2.  1/27 cxr Personally reviewed Multiple ill-defined nodular opacities of varying sizes, several of which show cavitation, likely representing  multifocal septic emboli. These nodular opacities overall appear similar to findings on most recent CT. There is a small right pleural effusion with slight increase in airspace opacity in the right base, likely focus of pneumonia.  Stable cardiac silhouette.  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 Course complicated by AKI   Imaging concerning for bilateral pulmonary septic emboli with cavitary nodules. tte large tv  vegetation of 3 cm. MRI t/l spine shows L4-5 and L5-S1 discitis/OM along with t11-s2 epidural abscess. He is s/p l4-5, l5-s1 laminectomy and evacuation of epidural abscess on 1/29  At this time doesn't appear to have much if any LE neurological deficit post surgery  Primary team had spoken with ct surgery regarding angiovac    Aki: c3 level low; normal c4. Suspect mostly due to sepsis atn. However, possibility of postinfectious GN (GN does occur much earlier in staph aureus related process). For infectious gn, treatment is against underlying cause.   ------------ 2/3 assessment Await tee today for CT surgery evaluation and decision on angiovac HD started 1/30  Slight thrombocytopenia new the last few days along with anemia -- will continue to observe. Not particularly a known side effect of dapto. I doubt dic/hus. He is on heparin   -continue daptomycin renal dosing per pharmacy -await TEE -will need at least 8 weeks abx therapy; would start 1/29 -defer thrombocytopenia w/u to primary team   [facility]   05/28/2020, 10:18 AM

## 2020-05-28 NOTE — Progress Notes (Signed)
Pt is in endoscopy, will retry as time and pt allow.  05/28/20 1300  PT Visit Information  Reason Eval/Treat Not Completed Patient at procedure or test/unavailable    Samul Dada, PT MS Acute Rehab Dept. Number: [facility] [id number] and [facility] [phone]

## 2020-05-28 NOTE — Progress Notes (Signed)
PT Cancellation Note  Patient Details Name: Steven Cortez MRN: [id number] DOB: 1976/02/13   Cancelled Treatment:    Reason Eval/Treat Not Completed: Patient declined, no reason specified.  Declined therapy this afternoon x 2 after AM procedures.  Follow up tomorrow.   Ivar Drape 05/28/2020, 4:50 PM   Samul Dada, PT MS Acute Rehab Dept. Number: [facility] [id number] and [facility] [phone]

## 2020-05-28 NOTE — Plan of Care (Signed)

## 2020-05-28 NOTE — Progress Notes (Addendum)
[facility] Progress Note    Assessment/ Plan:   Steven Houchinsis a/an 44 y.o.malewith a past medical history Hep C and IVDUwho present w/ likely endocarditis and ARF with electrolyte abnormalities.  [name]   1. Severe AKI a. Patient presented with multiple electrolyte abnormalities and uremia .   Differential for the AKI includes ATN, renal infarction from septic emboli although this is not on imaging, infection.  Urinalysis positive for WBCs, RBCs, protein.  EPC was not consistent with nephrotic syndrome.  Renal ultrasound showed mild hydronephrosis and a distended bladder.  Received his first dialysis on 1/30 and a second on 2/1.  Urine output over the last 24 hours of 2251.  Negative of 9718 since admission.  Creatinine improving with creatinine on 2/1 of 1.91 from 2.85 on 1/31.  Patient's creatinine continues to improve with a creatinine of 1.65 from 1.75 yesterday.  BUN of 78 from 84 yesterday. Last HD 2/1- but now nonoliguric and seems to be recovering.  Will keep temp line in at least one more day.  Get foley out today.  Also has been having a lot of UOP-  Will stop lasix  b. Consider removal of temporary HD catheter tomorrow 2/4 if kidney function continues to improve c. No plan for HD today and hopefully if kidney function continues to improve he will not need further HD in the near future. d. Plan to do Foley trial after procedure today. e. Dose meds for GFR f. Strict I's and O's, daily weights g. Maintain a MAP greater than 65 for renal perfusion h. Avoid nephrotoxic agents  2. Hyperkalemia secondary to AKI, now resolved 3. Hyperphosphatemia a. Phosphate of 6.7 on 2/1, he was started on Renvela, phosphate improved to 5.5 on 2/2. b. Daily BMP c. stop Renvela 4. Metabolic acidosis-improved, stopping oral bicarb 5. Hypocalcemia a. Corrects to 9.6 b. Continue calcium carbonate 500 3 times  daily 6. Hepatitis C a. Not treated b. Recommend PCR to determine viral load and then consider an ID consult 7. Hyponatremia a. Normal sodium of 135 today 2/3 b. Morning BMPs 8. 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 Antibiotics per primary team and ID 9. PE a. Patient had an abnormal VQ scan and the hemoptysis b. Currently on heparin GTT  10. Hypertension a. Likely due to volume overload, blood pressures over the last 24 hours have ranged from 162/93-181/95.   b. Discontinue Lasix at this time given post ATN diuresis c. Continue amlodipine 5 mg daily 11. Anemia a. Hemoglobin stable at 7.5 b. Transfusion threshold of 7, continue to monitor and transfuse as needed c. ESA given  Patient seen and examined, agree with above note with above modifications. From a kidney standpoint, last HD was 2/1-  Is now non oliguric and seems to be recovering renal function.  Cont to watch, keep hd line in at least one more day.  Take out foley and stop diuretic as I do not feel is overloaded.  Also will stop renvela -  Should not need as renal function recovers  Steven Sable, MD 05/28/2020       Subjective:   Patient did well overnight, complaining of right hip pain this morning and that he is hungry but otherwise no concerns.  Patient is currently n.p.o. for endoscopic today.   Objective:   BP (!) 181/95 (BP Location: Left Arm)   Pulse (!) 41   Temp 99 F (  37.2 C) (Oral)   Resp 20   Ht 5\' 10"  (1.778 m)   Wt 79.4 kg Comment: scale a  SpO2 95%   BMI 25.12 kg/m   Intake/Output Summary (Last 24 hours) at 05/28/2020 07/26/2020 Last data filed at 05/28/2020 0448 Gross per 24 hour  Intake 720 ml  Output 2250 ml  Net -1530 ml   Weight change: -2.475 kg  Physical Exam: Gen: Well-appearing, lying in bed in no acute distress CVS: Regular rate and rhythm, no murmurs appreciated Resp: Lungs are clear to auscultation bilaterally with normal work of breathing Abd: Soft,  nontender, positive bowel sounds Ext: No lower extremity edema noted, scab on right calf which does not appear infected Dialysis access: RIJ temp HD catheter-in for 6 days at this time, consider removal of temporary catheter tomorrow if kidney function continues to improve  Imaging: No results found.  Labs: BMET Recent Labs  Lab 05/21/20 1850 05/22/20 0226 05/22/20 2118 05/23/20 0650 05/24/20 0203 05/25/20 0451 05/26/20 0600 05/27/20 1008 05/28/20 0500  NA 127*   < > 130* 134* 133* 136 134* 135 135  K 5.8*   < > 5.3* 4.9 5.2* 4.6 4.2 4.0 4.0  CL 98   < > 100 104 102 101 99 100 101  CO2 12*   < > 13* 15* 15* 21* 23 26 25   GLUCOSE 147*   < > 109* 92 149* 110* 97 105* 98  BUN 184*   < > 171* 165* 177* 136* 123* 84* 78*  CREATININE 6.56*   < > 4.81* 4.11* 4.16* 2.85* 1.91* 1.75* 1.65*  CALCIUM 6.7*   < > 6.5* 6.9* 7.5* 7.8* 7.7* 7.8* 7.9*  PHOS >30.0*  --   --  11.9* >30.0* 9.2* 6.7* 5.5* 4.7*   < > = values in this interval not displayed.   CBC Recent Labs  Lab 05/21/20 1850 05/22/20 0226 05/25/20 0451 05/26/20 0600 05/26/20 1637 05/27/20 1008 05/28/20 0500  WBC 15.9*   < > 18.0* 12.9*  --  10.1 12.1*  NEUTROABS 14.0*  --   --   --   --   --   --   HGB 7.3*   < > 7.5* 7.0* 7.8* 7.5* 7.5*  HCT 21.8*   < > 22.2* 20.2* 22.9* 21.8* 22.4*  MCV 78.4*   < > 81.0 81.1  --  83.8 85.2  PLT 154   < > 166 131*  --  102* 111*   < > = values in this interval not displayed.    Medications:    . sodium chloride   Intravenous Once  . amLODipine  5 mg Oral Daily  . Chlorhexidine Gluconate Cloth  6 each Topical Daily  . darbepoetin (ARANESP) injection - DIALYSIS  150 mcg Intravenous Q Tue-HD  . furosemide  40 mg Oral BID  . heparin  5,000 Units Subcutaneous Q8H  . mupirocin ointment  1 application Nasal BID  . sevelamer carbonate  1,600 mg Oral TID WC     07/25/20, MD [facility] Family Medicine Resident, PGY2 05/28/2020, 8:29 AM

## 2020-05-28 NOTE — Anesthesia Procedure Notes (Signed)
Procedure Name: MAC Date/Time: 05/28/2020 11:44 AM Performed by: Colin Benton, CRNA Pre-anesthesia Checklist: Patient identified, Emergency Drugs available, Suction available and Patient being monitored Patient Re-evaluated:Patient Re-evaluated prior to induction Oxygen Delivery Method: Nasal cannula Induction Type: IV induction Airway Equipment and Method: Bite block Placement Confirmation: positive ETCO2 Dental Injury: Teeth and Oropharynx as per pre-operative assessment

## 2020-05-28 NOTE — Anesthesia Postprocedure Evaluation (Signed)
Anesthesia Post Note  Patient: Steven Cortez  Procedure(s) Performed: TRANSESOPHAGEAL ECHOCARDIOGRAM (TEE) (N/A ) BUBBLE STUDY     Patient location during evaluation: Endoscopy Anesthesia Type: MAC Level of consciousness: awake and alert Pain management: pain level controlled Vital Signs Assessment: post-procedure vital signs reviewed and stable Respiratory status: spontaneous breathing, nonlabored ventilation and respiratory function stable Cardiovascular status: blood pressure returned to baseline and stable Postop Assessment: no apparent nausea or vomiting Anesthetic complications: no   No complications documented.  Last Vitals:  Vitals:   05/28/20 1250 05/28/20 1307  BP:  (!) 143/78  Pulse: 88 81  Resp: 17 18  Temp:  37 C  SpO2: 97% 96%    Last Pain:  Vitals:   05/28/20 1553  TempSrc:   PainSc: 0-No pain                 Lidia Collum

## 2020-05-28 NOTE — CV Procedure (Signed)
INDICATIONS: endocarditis  PROCEDURE:   Informed consent was obtained prior to the procedure. The risks, benefits and alternatives for the procedure were discussed and the patient comprehended these risks.  Risks include, but are not limited to, cough, sore throat, vomiting, nausea, somnolence, esophageal and stomach trauma or perforation, bleeding, low blood pressure, aspiration, pneumonia, infection, trauma to the teeth and death.    After a procedural time-out, the oropharynx was anesthetized with 20% benzocaine spray.   During this procedure the patient was administered propofol per anesthesia.  The patient's heart rate, blood pressure, and oxygen saturation were monitored continuously during the procedure. The period of conscious sedation was 35 minutes, of which I was present face-to-face 100% of this time.  The transesophageal probe was inserted in the esophagus and stomach without difficulty and multiple views were obtained.  The patient was kept under observation until the patient left the procedure room.  The patient left the procedure room in stable condition.   Agitated microbubble saline contrast was administered.  COMPLICATIONS:    There were no immediate complications.  FINDINGS:   FORMAL ECHOCARDIOGRAM REPORT PENDING Normal LV function, normal RV function.  Large tricuspid valve vegetation, appears attached to posterior tricuspid valve leaflet, 3.5 x 0.5 cm serpiginous lesions, adherent to atrial surface of valve and crossing into RV. Moderate-severe TR.  Possible very tiny PFO - further echo image review pending in formal report.  No other significant valve lesions or vegetations.  RECOMMENDATIONS:     can return to hospital room when alert.   Time Spent Directly with the Patient:  60 minutes   Parke Poisson 05/28/2020, 12:33 PM

## 2020-05-28 NOTE — Interval H&P Note (Signed)
History and Physical Interval Note:  05/28/2020 11:26 AM  Steven Cortez  has presented today for surgery, with the diagnosis of BACTEREMIA.  The various methods of treatment have been discussed with the patient and family. After consideration of risks, benefits and other options for treatment, the patient has consented to  Procedure(s): TRANSESOPHAGEAL ECHOCARDIOGRAM (TEE) (N/A) as a surgical intervention.  The patient's history has been reviewed, patient examined, no change in status, stable for surgery.  I have reviewed the patient's chart and labs.  Questions were answered to the patient's satisfaction.     Parke Poisson

## 2020-05-28 NOTE — Anesthesia Preprocedure Evaluation (Signed)
Anesthesia Evaluation  Patient identified by MRN, date of birth, ID band Patient awake    Reviewed: Allergy & Precautions, NPO status , Patient's Chart, lab work & pertinent test results  History of Anesthesia Complications Negative for: history of anesthetic complications  Airway Mallampati: II  TM Distance: >3 FB Neck ROM: Full    Dental   Pulmonary Current Smoker and Patient abstained from smoking., PE   Pulmonary exam normal        Cardiovascular hypertension, Normal cardiovascular exam     Neuro/Psych negative neurological ROS  negative psych ROS   GI/Hepatic negative GI ROS, (+)     substance abuse  IV drug use, Hepatitis -, C  Endo/Other  negative endocrine ROS  Renal/GU ARF and DialysisRenal disease  negative genitourinary   Musculoskeletal negative musculoskeletal ROS (+)   Abdominal   Peds  Hematology  (+) anemia ,   Anesthesia Other Findings  Bacteremia w/ endocarditis on TTE  Reproductive/Obstetrics                             Anesthesia Physical Anesthesia Plan  ASA: IV  Anesthesia Plan: MAC   Post-op Pain Management:    Induction: Intravenous  PONV Risk Score and Plan: 1 and Propofol infusion, TIVA and Treatment may vary due to age or medical condition  Airway Management Planned: Natural Airway, Nasal Cannula and Simple Face Mask  Additional Equipment: None  Intra-op Plan:   Post-operative Plan:   Informed Consent: I have reviewed the patients History and Physical, chart, labs and discussed the procedure including the risks, benefits and alternatives for the proposed anesthesia with the patient or authorized representative who has indicated his/her understanding and acceptance.       Plan Discussed with:   Anesthesia Plan Comments:         Anesthesia Quick Evaluation

## 2020-05-28 NOTE — Transfer of Care (Signed)
Immediate Anesthesia Transfer of Care Note  Patient: Steven Cortez  Procedure(s) Performed: TRANSESOPHAGEAL ECHOCARDIOGRAM (TEE) (N/A ) BUBBLE STUDY  Patient Location: Endoscopy Unit  Anesthesia Type:MAC  Level of Consciousness: drowsy  Airway & Oxygen Therapy: Patient Spontanous Breathing and Patient connected to nasal cannula oxygen  Post-op Assessment: Report given to RN and Post -op Vital signs reviewed and stable  Post vital signs: Reviewed and stable  Last Vitals:  Vitals Value Taken Time  BP    Temp    Pulse 84   Resp 18   SpO2 100     Last Pain:  Vitals:   05/28/20 1041  TempSrc: Temporal  PainSc: 5       Patients Stated Pain Goal: 2 (97/98/92 1194)  Complications: No complications documented.

## 2020-05-29 DIAGNOSIS — B1920 Unspecified viral hepatitis C without hepatic coma: Secondary | ICD-10-CM

## 2020-05-29 DIAGNOSIS — M4627 Osteomyelitis of vertebra, lumbosacral region: Secondary | ICD-10-CM

## 2020-05-29 DIAGNOSIS — E8809 Other disorders of plasma-protein metabolism, not elsewhere classified: Secondary | ICD-10-CM

## 2020-05-29 DIAGNOSIS — M546 Pain in thoracic spine: Secondary | ICD-10-CM

## 2020-05-29 LAB — CBC WITH DIFFERENTIAL/PLATELET
Abs Immature Granulocytes: 0.07 10*3/uL (ref 0.00–0.07)
Basophils Absolute: 0 10*3/uL (ref 0.0–0.1)
Basophils Relative: 0 %
Eosinophils Absolute: 0.1 10*3/uL (ref 0.0–0.5)
Eosinophils Relative: 1 %
HCT: 23.3 % — ABNORMAL LOW (ref 39.0–52.0)
Hemoglobin: 7.7 g/dL — ABNORMAL LOW (ref 13.0–17.0)
Immature Granulocytes: 1 %
Lymphocytes Relative: 13 %
Lymphs Abs: 1.6 10*3/uL (ref 0.7–4.0)
MCH: 28.3 pg (ref 26.0–34.0)
MCHC: 33 g/dL (ref 30.0–36.0)
MCV: 85.7 fL (ref 80.0–100.0)
Monocytes Absolute: 0.9 10*3/uL (ref 0.1–1.0)
Monocytes Relative: 8 %
Neutro Abs: 9 10*3/uL — ABNORMAL HIGH (ref 1.7–7.7)
Neutrophils Relative %: 77 %
Platelets: 118 10*3/uL — ABNORMAL LOW (ref 150–400)
RBC: 2.72 MIL/uL — ABNORMAL LOW (ref 4.22–5.81)
RDW: 16.2 % — ABNORMAL HIGH (ref 11.5–15.5)
WBC: 11.7 10*3/uL — ABNORMAL HIGH (ref 4.0–10.5)
nRBC: 0 % (ref 0.0–0.2)

## 2020-05-29 LAB — RENAL FUNCTION PANEL
Albumin: 1.8 g/dL — ABNORMAL LOW (ref 3.5–5.0)
Anion gap: 10 (ref 5–15)
BUN: 69 mg/dL — ABNORMAL HIGH (ref 6–20)
CO2: 25 mmol/L (ref 22–32)
Calcium: 8.2 mg/dL — ABNORMAL LOW (ref 8.9–10.3)
Chloride: 99 mmol/L (ref 98–111)
Creatinine, Ser: 1.59 mg/dL — ABNORMAL HIGH (ref 0.61–1.24)
GFR, Estimated: 55 mL/min — ABNORMAL LOW (ref >60)
Glucose, Bld: 97 mg/dL (ref 70–99)
Phosphorus: 3.9 mg/dL (ref 2.5–4.6)
Potassium: 4.3 mmol/L (ref 3.5–5.1)
Sodium: 134 mmol/L — ABNORMAL LOW (ref 135–145)

## 2020-05-29 LAB — GLUCOSE, CAPILLARY: Glucose-Capillary: 158 mg/dL — ABNORMAL HIGH (ref 70–99)

## 2020-05-29 LAB — PROTIME-INR
INR: 1.2 (ref 0.8–1.2)
Prothrombin Time: 14.9 seconds (ref 11.4–15.2)

## 2020-05-29 LAB — APTT: aPTT: 40 seconds — ABNORMAL HIGH (ref 24–36)

## 2020-05-29 MED ORDER — OXYCODONE HCL 5 MG PO TABS
10.0000 mg | ORAL_TABLET | Freq: Four times a day (QID) | ORAL | Status: AC | PRN
  Administered 2020-05-29 – 2020-05-31 (×8): 10 mg via ORAL
  Filled 2020-05-29 (×12): qty 2

## 2020-05-29 MED ORDER — CARVEDILOL 3.125 MG PO TABS
3.1250 mg | ORAL_TABLET | Freq: Two times a day (BID) | ORAL | Status: DC
  Administered 2020-05-29 – 2020-06-09 (×20): 3.125 mg via ORAL
  Filled 2020-05-29 (×20): qty 1

## 2020-05-29 NOTE — Progress Notes (Signed)
[facility]   Reason for visit: Follow up on discitis and TV endocarditis  Interval History: WBC 11.7, remains afebrile.  Complaint of pain.   Day 9 total antibiotics Day 8 daptomycin   Physical Exam: Constitutional:  Vitals:   05/29/20 0428 05/29/20 0840  BP: (!) 168/73 (!) 171/95  Pulse: 92 91  Resp: 18 (!) 23  Temp: 98.5 F (36.9 C) 97.9 F (36.6 C)  SpO2: 99% 97%   patient appears in NAD Respiratory: Normal respiratory effort; CTA B Cardiovascular: RRR  Review of Systems: Constitutional: negative for fevers and chills Gastrointestinal: negative for nausea  Lab Results  Component Value Date   WBC 11.7 (H) 05/29/2020   HGB 7.7 (L) 05/29/2020   HCT 23.3 (L) 05/29/2020   MCV 85.7 05/29/2020   PLT 118 (L) 05/29/2020    Lab Results  Component Value Date   CREATININE 1.59 (H) 05/29/2020   BUN 69 (H) 05/29/2020   NA 134 (L) 05/29/2020   K 4.3 05/29/2020   CL 99 05/29/2020   CO2 25 05/29/2020    Lab Results  Component Value Date   ALT 22 05/27/2020   AST 23 05/27/2020   ALKPHOS 186 (H) 05/27/2020     Microbiology: Recent Results (from the past 240 hour(s))  SARS Coronavirus 2 by RT PCR (hospital order, performed in [facility] hospital lab) Nasopharyngeal Nasopharyngeal Swab     Status: None   Collection Time: 05/21/20  8:45 AM   Specimen: Nasopharyngeal Swab  Result Value Ref Range Status   SARS Coronavirus 2 NEGATIVE NEGATIVE Final    Comment: (NOTE) SARS-CoV-2 target nucleic acids are NOT DETECTED.  The SARS-CoV-2 RNA is generally detectable in upper and lower respiratory specimens during the acute phase of infection. The lowest concentration of SARS-CoV-2 viral copies this assay can detect is 250 copies / mL. A negative result does not preclude SARS-CoV-2 infection and should not be used as the sole basis for treatment or other patient management decisions.  A negative result may occur with improper specimen collection  / handling, submission of specimen other than nasopharyngeal swab, presence of viral mutation(s) within the areas targeted by this assay, and inadequate number of viral copies (<250 copies / mL). A negative result must be combined with clinical observations, patient history, and epidemiological information.  Fact Sheet for Patients:   BoilerBrush.com.cy  Fact Sheet for Healthcare Providers: https://pope.com/  This test is not yet approved or  cleared by the [place] FDA and has been authorized for detection and/or diagnosis of SARS-CoV-2 by FDA under an Emergency Use Authorization (EUA).  This EUA will remain in effect (meaning this test can be used) for the duration of the COVID-19 declaration under Section 564(b)(1) of the Act, 21 U.S.C. section 360bbb-3(b)(1), unless the authorization is terminated or revoked sooner.  Performed at [facility], [address], [place], [place] [address]   Blood culture (routine x 2)     Status: None   Collection Time: 05/21/20  9:18 AM   Specimen: BLOOD  Result Value Ref Range Status   Specimen Description BLOOD SITE NOT SPECIFIED  Final   Special Requests   Final    BOTTLES DRAWN AEROBIC ONLY Blood Culture results may not be optimal due to an inadequate volume of blood received in culture bottles   Culture   Final    NO GROWTH 5 DAYS Performed at [facility], [address], [place], [place] [address]  Report Status 05/26/2020 FINAL  Final  Surgical pcr screen     Status: Abnormal   Collection Time: 05/23/20  7:26 AM   Specimen: Nasal Mucosa; Nasal Swab  Result Value Ref Range Status   MRSA, PCR POSITIVE (A) NEGATIVE Final    Comment: CRITICAL RESULT CALLED TO, READ BACK BY AND VERIFIED WITH: TINA ISSACS AT 1022 1/29 BY MM    Staphylococcus aureus POSITIVE (A) NEGATIVE Final    Comment: (NOTE) The Xpert SA Assay (FDA approved for NASAL specimens in patients 22 years  of age and older), is one component of a comprehensive surveillance program. It is not intended to diagnose infection nor to guide or monitor treatment. Performed at [facility], [address], [place], [place] [address]   Aerobic/Anaerobic Culture (surgical/deep wound)     Status: None   Collection Time: 05/23/20  9:07 AM   Specimen: Abscess  Result Value Ref Range Status   Specimen Description ABSCESS  Final   Special Requests LUMBAR EPIDURAL SPEC A  Final   Gram Stain   Final    RARE WBC PRESENT,BOTH PMN AND MONONUCLEAR NO ORGANISMS SEEN    Culture   Final    FEW METHICILLIN RESISTANT STAPHYLOCOCCUS AUREUS NO ANAEROBES ISOLATED Performed at [facility], [address], [place], [place] [address]    Report Status 05/28/2020 FINAL  Final   Organism ID, Bacteria METHICILLIN RESISTANT STAPHYLOCOCCUS AUREUS  Final      Susceptibility   Methicillin resistant staphylococcus aureus - MIC*    CIPROFLOXACIN >=8 RESISTANT Resistant     ERYTHROMYCIN >=8 RESISTANT Resistant     GENTAMICIN <=0.5 SENSITIVE Sensitive     OXACILLIN >=4 RESISTANT Resistant     TETRACYCLINE <=1 SENSITIVE Sensitive     VANCOMYCIN <=0.5 SENSITIVE Sensitive     TRIMETH/SULFA <=10 SENSITIVE Sensitive     CLINDAMYCIN <=0.25 SENSITIVE Sensitive     RIFAMPIN <=0.5 SENSITIVE Sensitive     Inducible Clindamycin NEGATIVE Sensitive     * FEW METHICILLIN RESISTANT STAPHYLOCOCCUS AUREUS    Impression/Plan:  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  Moderate to severe TR.  To get evaluation regarding possible angiovac.  Positive for MRSA at [facility] and left AMA.  Will need a prolonged course of antibiotics. Blood cultures here have remained negative.  2.  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  S/p operative minimally invasive laminectomy by Dr. Maurice Small.   Antibiotics as above.  Will need a prolonged 8 week course.   Continue with daptomycin.  3. Hepatitis C - RNA negative for active infection.   Dr. Ninetta Lights is available over the weekend if needed, otherwise I will follow up on Monday

## 2020-05-29 NOTE — Progress Notes (Signed)
Physical Therapy Treatment Patient Details Name: Steven Cortez MRN: [id number] DOB: 1975/06/12 Today's Date: 05/29/2020    History of Present Illness Pt is a 45 year old male with PMHx including Hep C and IVDU who presented with back pain, weakness, dyspnea, pleuritic chest pain, urinary retention, and pitting edema. He was recently admitted to [facility] and found to have tricuspid endocarditis with MRSA bacteremia, and pulmonary septic emboli but left AMA. 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 s/pTEE 05/28/20.    PT Comments    Patient progressing slowly towards PT goals mostly due to pain. Reports shooting and sharp pain in right back/hip and into RLE posteriorly which has not been relieved by pain meds. Noted to have pitting edema in right foot today, 2+ with some numbness which pt reports is new as of 1 day ago. With some encouragement, pt tolerated short distance ambulation in room with use of RW for support and supervision for safety. Reports feeling painful and weak. Encouraged pt to continue to try to mobilize as much as tolerated. Will follow.    Follow Up Recommendations  No PT follow up     Equipment Recommendations  Rolling walker with 5" wheels (already delivered)    Recommendations for Other Services       Precautions / Restrictions Precautions Precautions: Fall Precaution Comments: utilized back precautions for comfort Restrictions Weight Bearing Restrictions: No    Mobility  Bed Mobility Overal bed mobility: Needs Assistance Bed Mobility: Rolling;Sidelying to Sit Rolling: Supervision Sidelying to sit: Supervision;HOB elevated       General bed mobility comments: HOB elevated slightly; demonstrated good log roll technique with increased time due to pain and use of rail.  Transfers Overall transfer level: Needs  assistance Equipment used: Rolling walker (2 wheeled) Transfers: Sit to/from Stand Sit to Stand: Supervision         General transfer comment: SUpervision for safety; stood from EOB x1, transferred to chair post ambulation.  Ambulation/Gait Ambulation/Gait assistance: Supervision Gait Distance (Feet): 45 Feet Assistive device: Rolling walker (2 wheeled) Gait Pattern/deviations: Step-through pattern;Decreased stride length Gait velocity: decreased   General Gait Details: Slow, mostly steady gait with a few standing rest breaks due to pain in RLE/hip/back. "i feel weak." No buckling noted. Cues for upright.   Stairs             Wheelchair Mobility    Modified Rankin (Stroke Patients Only)       Balance Overall balance assessment: Needs assistance Sitting-balance support: Feet supported;Bilateral upper extremity supported Sitting balance-Leahy Scale: Good Sitting balance - Comments: BUE support as position of comfort due to pain.   Standing balance support: During functional activity Standing balance-Leahy Scale: Fair Standing balance comment: Pt prefers to utilizes rw with mobility at this time.                            Cognition Arousal/Alertness: Awake/alert Behavior During Therapy: WFL for tasks assessed/performed Overall Cognitive Status: Within Functional Limits for tasks assessed                                        Exercises      General Comments General comments (skin integrity, edema, etc.): Elevated BP prior to session 171/95,  likelt related to high pain level. Pitting edema 2+ in right foot with some numbness which pt reports is new as of 1 day ago.      Pertinent Vitals/Pain Pain Assessment: Faces Faces Pain Scale: Hurts even more Pain Location: RLE/hip/back worsened with movement Pain Descriptors / Indicators: Discomfort;Sharp;Shooting Pain Intervention(s): Monitored during session;Repositioned;Patient  requesting pain meds-RN notified;Limited activity within patient's tolerance    Home Living                      Prior Function            PT Goals (current goals can now be found in the care plan section) Progress towards PT goals: Progressing toward goals (slowly)    Frequency    Min 3X/week      PT Plan Current plan remains appropriate    Co-evaluation              AM-PAC PT "6 Clicks" Mobility   Outcome Measure  Help needed turning from your back to your side while in a flat bed without using bedrails?: None Help needed moving from lying on your back to sitting on the side of a flat bed without using bedrails?: None Help needed moving to and from a bed to a chair (including a wheelchair)?: None Help needed standing up from a chair using your arms (e.g., wheelchair or bedside chair)?: None Help needed to walk in hospital room?: None Help needed climbing 3-5 steps with a railing? : A Little 6 Click Score: 23    End of Session   Activity Tolerance: Patient limited by pain Patient left: in chair;with call bell/phone within reach Nurse Communication: Mobility status PT Visit Diagnosis: Unsteadiness on feet (R26.81);Pain Pain - Right/Left: Right Pain - part of body: Leg (back)     Time: 7829-5621 PT Time Calculation (min) (ACUTE ONLY): 22 min  Charges:  $Therapeutic Activity: 8-22 mins                     [name], PT, DPT Acute Rehabilitation Services Pager [phone] Office [phone]       [name] 05/29/2020, 9:37 AM

## 2020-05-29 NOTE — Progress Notes (Signed)
Subjective:  Patient seen in early AM. Endorses continued right hip pain, and reports feels tired today due to limited sleep overnight. Conveys has ambulated frequently overnight, without concern.   Objective:  Vital signs in last 24 hours: Vitals:   05/28/20 2031 05/29/20 0005 05/29/20 0428 05/29/20 0840  BP: (!) 177/98 (!) 165/96 (!) 168/73 (!) 171/95  Pulse: 92 (!) 101 92 91  Resp: 18 18 18  (!) 23  Temp: 99.2 F (37.3 C) 99 F (37.2 C) 98.5 F (36.9 C) 97.9 F (36.6 C)  TempSrc: Oral Oral Oral Oral  SpO2: 97%  99% 97%  Weight:  79.9 kg    Height:       Weight change: 0.454 kg  Intake/Output Summary (Last 24 hours) at 05/29/2020 1131 Last data filed at 05/29/2020 07/27/2020 Gross per 24 hour  Intake 780 ml  Output 3100 ml  Net -2320 ml   Physical Exam Constitutional:      General: He is not in acute distress.    Appearance: He is not ill-appearing, toxic-appearing or diaphoretic.     Comments: Tired appearing male seen lying curled in bed, NAD. Interactive.   Cardiovascular:     Rate and Rhythm: Normal rate and regular rhythm.  Pulmonary:     Effort: Pulmonary effort is normal. No tachypnea or respiratory distress.  Musculoskeletal:     Comments: Mild tenderness to palpation of right hip.   Neurological:     Mental Status: He is alert.      Assessment/Plan:  Resolved: Hyperkalemia elevated AGMA Hypocalcemia Hep C:Patient with hx of IV drug use. Reportshx of untreated Hep C.Hep C PCR negativethis admission  Principal Problem:   AKI (acute kidney injury) (HCC) Active Problems:   Endocarditis   Urinary retention   Hyperkalemia   Microcytic anemia   MRSA infection   Epidural abscess   Travious Houchinsis a 44 y.o.malewith hx of IV drug use and untreated Hep Cwho was diagnosed with endocarditis and MRSA bacteremia at [facility], left AMA then presented to [facility] a few days later. Also found to have large epidural abscess,s/p laminectomy and  evacuation by neurosurgery on1/29. Symptoms and leukocytosis are improving with antibiotics.  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 Repeat TTE here shows 1 x 3 cm vegetation on the septal leaflet of the tricuspid valve. Blood cultures without growth.S/p laminectomy andevacuationof epidural abscess on 1/29. CT surgery desired TEE,in consideration of potentialangio VAC debridement.TEE performed yesterday, showing 3.5 x 0.5cm vegetation attached to atrial aspect of tricuspid, LVEF of 55-60%, and small PFO per bubble study.   -F/u CT surgery recs -Appreciate ID  -continue daptomycin  -will need at least 8 weeks abx therapy -Appreciate Neurosurgery -activity as tolerated -Appreciate PT and OT -No f/u recs  Back pain  Patient s/p epidural abscess evacuation on 1/29 following consistent Thoracolumbar MRI. Reporting new onset unchanging, intermittent right hip tenderness with stabbing back pain that worsens with backward leaning; and is unchanged by ambulation. Denies left-sided pain. Strength has been improving. Right hip CT without contrast conveyed potential intramuscular hematoma. Patient reports ongoing pain.    -Adjusted pain regimine   Thrombocytopenia, resolving Patient had gradual drop in platelet count, that has been improving. True thrombocytopenia per tech smear report. Notably resumed heparin prophylaxis few days following epidural abscess evacuation preceding  platelet drop.Patient had thrombocytopenia following starting anticoagulation at [facility] prior to this admission. Prior to this, patient reports has not been hospitalized in years, and to the best of his knowledge has never been on anticoagulation previously. Hgb  stable.Haptoglobin normal and LDH only mildly elevated on 1/30. Patient also on daptomycin (which has potential to cause thrombocytopenia due to BM suppression), and linezolid previously which can do same. Low risk per 4T.   -Trend CBC    Acute renal failure, resolving  Hyponatremia Hypervolemia; resolved Creatinine7.23 on admission, downtrending with recent of1.59.Hyperkalemia resolved. Anuriahad been presenton admission, with good UOP following foley placement.Pyuria and hematuria present on urinalysis.Likely multifactorial, with some contribution of initial acute obstruction.Renal u/s significant for bladder distension and mild bilateral hydronephrosis.Renal function Improved with dialysis and autodiuresis. S/p foley removal yesterday, with good urine output, and continued renal improvement.    -Appreciate nephrology recommendations -Remove HD catheter, no longer needing HD  -Signing off -Stopped Renvela  -Stopped Lasix -discontinuedCa carbonate 500 TID -discontinuedSodium Bicarbonate TID   HTN Component of acute renal failure. Renal functioning improved.Patient now euvolemic.HTN persists, though improving.   -Continue Amlodipine 5mg  per nephro -Started Carvedilol 3.125 per nephro   Microcytic anemialikely 2/2 Chronic Disease Acute Anemia Hypo-proliferative per corrected retic count. Elevated ferritin, low iron and TIBC. Etiology of anemia likely multifactorial with prime component of anemia of chronic inflammation.Hgbinitiallystable following 1 unit pRBCson 1/28, but dropped to 7.Now S/panother uniton 2/1. Hgb remains stable.  -Aranesp per Nephro   LOS: 8 days   2/28, Medical Student 05/29/2020, 11:31 AM

## 2020-05-29 NOTE — Progress Notes (Signed)
     [address]       [place] [address]             [phone]       Transesophageal echocardiogram was reviewed.  The patient does have a large atrial component to this vegetation, but unfortunately the patient also has a PFO.  This is not an absolute contraindication for angio VAC debridement however it does present a significant risk of embolic stroke if a fragment were to cross the PFO.  Options would include balloon occlusion of the PFO during the procedure, but this will require advanced therapy with interventional cardiology.  I will speak to them next week about the potential of doing this.  Fortunately his most recent blood cultures have been negative.  I am on vacation next week this for now plans to do any angio VAC debridement in the coming week.  Continue medical therapy for now.  Skie Vitrano Keane Scrape

## 2020-05-29 NOTE — Progress Notes (Addendum)
[facility] Progress Note    Assessment/ Plan:   Steven Houchinsis a/an 44 y.o.malewith a past medical history Hep C and IVDUwho present w/ likely endocarditis and ARF with electrolyte abnormalities.  He was recently admitted to [facility] but left AMA and presented 1/27 to [facility].   1. Severe AKI a. Patient presented with multiple electrolyte abnormalities and uremia .   Differential for the AKI includes ATN, renal infarction from septic emboli although this is not on imaging, infection.  Urinalysis positive for WBCs, RBCs, protein.  EPC was not consistent with nephrotic syndrome.  Renal ultrasound showed mild hydronephrosis and a distended bladder.  Received his first dialysis on 1/30 and a second on 2/1.  Urine output over the last 24 hours of liters.  Most likely post ATN diuresis.  Negative of 9718 since admission.  Creatinine improving with creatinine on 2/1 of 1.91 from 2.85 on 1/31.  Patient continues to improve with creatinine of 1.59 from 1.65 yesterday.  BUN improving to 69 from 84.  Patient's last dialysis session was 2/1. b. Can remove temporary HD catheter today given we do not feel he will need further dialysis during this hospitalization. c. Foley catheter was removed yesterday and patient is urinating without difficulty. d. Dose meds for GFR e. Strict I's and O's, daily weights f. Maintain a MAP greater than 65 for renal perfusion g. Avoid nephrotoxic agents  2. Hyperkalemia secondary to AKI, now resolved 3. Hyperphosphatemia a. Phosphate of 6.7 on 2/1, he was started on Renvela, phosphate improved to 5.5 on 2/2. b. Daily BMP c. stop Renvela 4. Metabolic acidosis-improved, stopping oral bicarb 5. Hypocalcemia a. Corrects to 9.6 b. Continue calcium carbonate 500 3 times daily 6. Hepatitis C a. Not treated b. Recommend PCR to determine viral load and then consider an ID consult 7. Hyponatremia a. Normal sodium of 135 today 2/3 b. Morning BMPs 8. 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 Antibiotics per primary team and ID 9. PE a. Patient had an abnormal VQ scan and the hemoptysis b. Currently on heparin GTT  10. Hypertension a. Likely due to volume overload, blood pressures over the last 24 hours have ranged from 136/77-189/92 with most recent being 171/95. b. Lasix discontinued given post ATN diuresis c. Continue amlodipine 5 mg daily d. Initiate Coreg 3.125 mg twice daily 11. Anemia a. Hemoglobin stable at 7.7 b. Transfusion threshold of 7, continue to monitor and transfuse as needed c. ESA given  Nephrology is signing off  Subjective:   No acute events overnight.  Complaining of right hip pain this morning but no other complaints.  Pleasant and conversive.   Objective:   BP (!) 168/73 (BP Location: Right Arm)   Pulse 92   Temp 98.5 F (36.9 C) (Oral)   Resp 18   Ht 5\' 10"  (1.778 m)   Wt 79.9 kg   SpO2 99%   BMI 25.27 kg/m   Intake/Output Summary (Last 24 hours) at 05/29/2020 07/27/2020 Last data filed at 05/29/2020 0400 Gross per 24 hour  Intake 540 ml  Output 2700 ml  Net -2160 ml   Weight change: 0.454 kg  Physical Exam: Gen: Well-appearing, sitting in chair next to hospital bed CVS: Regular rate and rhythm, no murmurs appreciated Resp: Normal work of breathing, lungs clear to auscultation bilaterally Abdomen: Soft, nontender, positive bowel sounds Ext: Trace right sided lower extremity edema, no erythema Dialysis access: RIJ temp HD catheter-in for 7 days at this time, order for removal  placed  Imaging: CT HIP RIGHT WO CONTRAST  Result Date: 05/28/2020 CLINICAL DATA:  History of septic emboli, severe right pain EXAM: CT OF THE RIGHT HIP WITHOUT CONTRAST TECHNIQUE: Multidetector CT imaging of the right hip was performed according to the standard protocol. Multiplanar CT image reconstructions were also generated. COMPARISON:  May 18, 2020 FINDINGS: Bones/Joint/Cartilage No fracture or dislocation. No  areas of cortical destruction or periosteal reaction. There is a small femoroacetabular joint effusion present. Ligaments Suboptimally assessed by CT. Muscles and Tendons Within the obturator externus and adductor musculature there appears to be hyperdense area with enlargement. The visualized portion of the tendons appear to be intact. Soft tissues Diffuse subcutaneous edema thickening seen the hip. There is scattered lymph nodes seen within the right inguinal region. IMPRESSION: No acute osseous abnormality. Hyperdense areas within the obturator externus and adductor musculature which represent hematoma/intramuscular bleed. Small hip joint effusion Diffuse subcutaneous edema, likely from cellulitis. Electronically Signed   By: Jonna Clark M.D.   On: 05/28/2020 19:58   ECHO TEE  Result Date: 05/28/2020    TRANSESOPHOGEAL ECHO REPORT   Patient Name:   Steven Cortez Date of Exam: 05/28/2020 Medical Rec #:  [id number]      Height:       70.0 in Accession #:    [id number]     Weight:       175.1 lb Date of Birth:  21-Jul-1975      BSA:          1.973 m Patient Age:    44 years       BP:           143/78 mmHg Patient Gender: M              HR:           81 bpm. Exam Location:  Inpatient Procedure: Transesophageal Echo, Color Doppler, Cardiac Doppler and 3D Echo Indications:     Endocarditis  History:         Patient has prior history of Echocardiogram examinations, most                  recent 05/22/2020. Risk Factors:Current Smoker.  Sonographer:     Thurman Coyer RDCS (AE) Referring Phys:  [id number] Beatriz Stallion Diagnosing Phys: Weston Brass MD PROCEDURE: After discussion of the risks and benefits of a TEE, an informed consent was obtained from the patient. TEE procedure time was 35 minutes. The transesophogeal probe was passed without difficulty through the esophogus of the patient. Imaged were obtained with the patient in a left lateral decubitus position. Local oropharyngeal anesthetic was provided with  Cetacaine. Sedation performed by different physician. The patient was monitored while under deep sedation. Anesthestetic sedation was provided intravenously by Anesthesiology: 957.52mg  of Propofol. Image quality was good. The patient's vital signs; including heart rate, blood pressure, and oxygen saturation; remained stable throughout the procedure. The patient developed no complications during the procedure. IMPRESSIONS  1. Large tricuspid valve vegetation, 3.5 x 0.5 cm serpiginous vegetation. Appears attached to atrial aspect of valve, most closely associated with base of posterior tricupsid valve leaflet. The tricuspid valve is abnormal. Tricuspid valve regurgitation is moderate to severe, and very eccentric.  2. Agitated saline contrast bubble study was positive with shunting observed within 3-6 cardiac cycles suggestive of interatrial shunt (1 to 2 bubbles seem to cross). There is a probable tiny patent foramen ovale.  3. Left ventricular ejection fraction, by estimation, is 55 to 60%.  The left ventricle has normal function.  4. Right ventricular systolic function is normal. The right ventricular size is normal.  5. No left atrial/left atrial appendage thrombus was detected.  6. The mitral valve is normal in structure. Trivial mitral valve regurgitation.  7. The aortic valve is tricuspid. Aortic valve regurgitation is not visualized. No aortic stenosis is present. FINDINGS  Left Ventricle: Left ventricular ejection fraction, by estimation, is 55 to 60%. The left ventricle has normal function. The left ventricular internal cavity size was normal in size. Right Ventricle: The right ventricular size is normal. No increase in right ventricular wall thickness. Right ventricular systolic function is normal. Left Atrium: Left atrial size was normal in size. No left atrial/left atrial appendage thrombus was detected. Right Atrium: Right atrial size was normal in size. Pericardium: Trivial pericardial effusion is  present. Mitral Valve: The mitral valve is normal in structure. Trivial mitral valve regurgitation. Tricuspid Valve: Large tricuspid valve vegetation, 3.5 x 0.5 cm serpiginous vegetation. Appears attached to atrial aspect of valve, most closely associated with the posterior tricupsid valve leaflet. The tricuspid valve is abnormal. Tricuspid valve regurgitation is moderate to severe. Aortic Valve: The aortic valve is tricuspid. Aortic valve regurgitation is not visualized. No aortic stenosis is present. Pulmonic Valve: The pulmonic valve was grossly normal. Pulmonic valve regurgitation is trivial. Aorta: The aortic root and ascending aorta are structurally normal, with no evidence of dilitation. IAS/Shunts: No atrial level shunt detected by color flow Doppler. Agitated saline contrast was given intravenously to evaluate for intracardiac shunting. Agitated saline contrast bubble study was positive with shunting observed within 3-6 cardiac cycles suggestive of interatrial shunt. A tiny patent foramen ovale is detected.  AORTIC VALVE LVOT Vmax:   153.00 cm/s LVOT Vmean:  93.600 cm/s LVOT VTI:    0.271 m TRICUSPID VALVE TR Peak grad:   28.1 mmHg TR Vmax:        265.00 cm/s  SHUNTS Systemic VTI: 0.27 m Weston Brass MD Electronically signed by Weston Brass MD Signature Date/Time: 05/28/2020/10:07:46 PM    Final     Labs: BMET Recent Labs  Lab 05/23/20 3662 05/24/20 0203 05/25/20 0451 05/26/20 0600 05/27/20 1008 05/28/20 0500 05/29/20 0500  NA 134* 133* 136 134* 135 135 134*  K 4.9 5.2* 4.6 4.2 4.0 4.0 4.3  CL 104 102 101 99 100 101 99  CO2 15* 15* 21* 23 26 25 25   GLUCOSE 92 149* 110* 97 105* 98 97  BUN 165* 177* 136* 123* 84* 78* 69*  CREATININE 4.11* 4.16* 2.85* 1.91* 1.75* 1.65* 1.59*  CALCIUM 6.9* 7.5* 7.8* 7.7* 7.8* 7.9* 8.2*  PHOS 11.9* >30.0* 9.2* 6.7* 5.5* 4.7* 3.9   CBC Recent Labs  Lab 05/26/20 0600 05/26/20 1637 05/27/20 1008 05/28/20 0500 05/29/20 0500  WBC 12.9*  --  10.1  12.1* 11.7*  NEUTROABS  --   --   --   --  9.0*  HGB 7.0* 7.8* 7.5* 7.5* 7.7*  HCT 20.2* 22.9* 21.8* 22.4* 23.3*  MCV 81.1  --  83.8 85.2 85.7  PLT 131*  --  102* 111* 118*    Medications:    . sodium chloride   Intravenous Once  . amLODipine  5 mg Oral Daily  . Chlorhexidine Gluconate Cloth  6 each Topical Daily  . darbepoetin (ARANESP) injection - DIALYSIS  150 mcg Intravenous Q Tue-HD  . heparin  5,000 Units Subcutaneous Q8H     07/27/20, MD [facility] Family Medicine Resident, PGY2 05/29/2020, 8:32  AM

## 2020-05-30 LAB — RENAL FUNCTION PANEL
Albumin: 1.7 g/dL — ABNORMAL LOW (ref 3.5–5.0)
Anion gap: 8 (ref 5–15)
BUN: 61 mg/dL — ABNORMAL HIGH (ref 6–20)
CO2: 25 mmol/L (ref 22–32)
Calcium: 8.3 mg/dL — ABNORMAL LOW (ref 8.9–10.3)
Chloride: 101 mmol/L (ref 98–111)
Creatinine, Ser: 1.52 mg/dL — ABNORMAL HIGH (ref 0.61–1.24)
GFR, Estimated: 58 mL/min — ABNORMAL LOW (ref >60)
Glucose, Bld: 125 mg/dL — ABNORMAL HIGH (ref 70–99)
Phosphorus: 3.9 mg/dL (ref 2.5–4.6)
Potassium: 4.4 mmol/L (ref 3.5–5.1)
Sodium: 134 mmol/L — ABNORMAL LOW (ref 135–145)

## 2020-05-30 MED ORDER — AMLODIPINE BESYLATE 10 MG PO TABS
10.0000 mg | ORAL_TABLET | Freq: Every day | ORAL | Status: DC
  Administered 2020-05-31 – 2020-06-06 (×7): 10 mg via ORAL
  Filled 2020-05-30 (×7): qty 1

## 2020-05-30 MED ORDER — AMLODIPINE BESYLATE 5 MG PO TABS
5.0000 mg | ORAL_TABLET | Freq: Once | ORAL | Status: AC
  Administered 2020-05-30: 5 mg via ORAL
  Filled 2020-05-30: qty 1

## 2020-05-30 NOTE — Progress Notes (Addendum)
   Subjective:  no significant overnight events Overall, he feels that his pain is better controlled this morning.  Discussed that he will require a lengthy period of antibiotics.  Objective:  Vital signs in last 24 hours: Vitals:   05/29/20 0840 05/29/20 2024 05/30/20 0400 05/30/20 0410  BP: (!) 171/95 (!) 158/89  (!) 162/99  Pulse: 91 90  84  Resp: (!) 23 16    Temp: 97.9 F (36.6 C) 99.1 F (37.3 C)  98.7 F (37.1 C)  TempSrc: Oral Oral  Oral  SpO2: 97% 97%  (!) 84%  Weight:   79.5 kg   Height:       General: chronically ill appearing Cardiac: extremities warm. No LE edema Pulm: breathing comfortably on room air Neuro: sensation to lower extremities are intact. 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 Cliffton Asters 2/4 for possible angiovac however is high risk due to PFO. Would need a balloon occlusion of the PFO if he were to do the angiovac.  -blood cultures negative to date Plan -continue daptomycin to complete 8w treatment.  -last CK normal on 2/1 -tenuous PIV access, so will refer for central line in the coming week. A PICC may be too risky given recent need for HD, we will discuss this with nephrology service. -pain management with oxycodone 10mg  q6h  Acute renal failure 2/2 endocarditis now off HD. HD line removed 2/4. Continues to have good urine output. Will continue to monitor with strict I/Os.  Anemia of chronic disease, Anemia of critical illness. Hemoglobin stable Thrombocytopenia stable -continue to monitor  Hypertension. Continue coreg. Increase amlodipine to  10mg .  Code: Full Diet: Regular VTE prophylaxis: heparin subcut Prior to Admission Living Arrangement: home Anticipated Discharge Location: home Barriers to Discharge: 4-6w of IV antibiotic treatment   , MD Internal Medicine Resident PGY-2 Internal Medicine Residency Pager: [phone] 05/30/2020 10:40 AM    After 5pm on weekdays and 1pm on weekends: On Call pager [phone]

## 2020-05-31 LAB — RENAL FUNCTION PANEL
Albumin: 1.7 g/dL — ABNORMAL LOW (ref 3.5–5.0)
Anion gap: 9 (ref 5–15)
BUN: 55 mg/dL — ABNORMAL HIGH (ref 6–20)
CO2: 23 mmol/L (ref 22–32)
Calcium: 8.1 mg/dL — ABNORMAL LOW (ref 8.9–10.3)
Chloride: 101 mmol/L (ref 98–111)
Creatinine, Ser: 1.52 mg/dL — ABNORMAL HIGH (ref 0.61–1.24)
GFR, Estimated: 58 mL/min — ABNORMAL LOW
Glucose, Bld: 115 mg/dL — ABNORMAL HIGH (ref 70–99)
Phosphorus: 4.1 mg/dL (ref 2.5–4.6)
Potassium: 4.8 mmol/L (ref 3.5–5.1)
Sodium: 133 mmol/L — ABNORMAL LOW (ref 135–145)

## 2020-05-31 LAB — CBC
HCT: 23.8 % — ABNORMAL LOW (ref 39.0–52.0)
Hemoglobin: 7.7 g/dL — ABNORMAL LOW (ref 13.0–17.0)
MCH: 28.7 pg (ref 26.0–34.0)
MCHC: 32.4 g/dL (ref 30.0–36.0)
MCV: 88.8 fL (ref 80.0–100.0)
Platelets: 121 10*3/uL — ABNORMAL LOW (ref 150–400)
RBC: 2.68 MIL/uL — ABNORMAL LOW (ref 4.22–5.81)
RDW: 17.5 % — ABNORMAL HIGH (ref 11.5–15.5)
WBC: 10.3 10*3/uL (ref 4.0–10.5)
nRBC: 0 % (ref 0.0–0.2)

## 2020-05-31 MED ORDER — ALBUTEROL SULFATE HFA 108 (90 BASE) MCG/ACT IN AERS
1.0000 | INHALATION_SPRAY | Freq: Four times a day (QID) | RESPIRATORY_TRACT | Status: DC | PRN
  Administered 2020-06-05 (×2): 2 via RESPIRATORY_TRACT
  Filled 2020-05-31: qty 6.7

## 2020-05-31 NOTE — Progress Notes (Addendum)
   Subjective:  Patient seen eating breakfast. Reports doing well. Hip and back pain is improving. Reports breathing well. Some mild chest tenderness when coughing, which has been typical for him. Performing ADLs without concerns. Conveys ambulating okay, though not reaching far distances. Discussed risk with potential Angiovac in light of PFO, and CT Surgery plan for additonal consideration on approach.   Objective:  Vital signs in last 24 hours: Vitals:   05/30/20 1135 05/30/20 1635 05/30/20 2000 05/31/20 0531  BP: (!) 163/101 (!) 170/99 (!) 158/100 (!) 161/91  Pulse: 84 88 91 88  Resp: 20 20 20 16   Temp: 98 F (36.7 C) 98.8 F (37.1 C) 98.2 F (36.8 C)   TempSrc: Oral Oral Oral Oral  SpO2: 98% 98% 100% 97%  Weight:    80.3 kg  Height:       Weight change: 0.8 kg  Intake/Output Summary (Last 24 hours) at 05/31/2020 07/29/2020 Last data filed at 05/30/2020 1700 Gross per 24 hour  Intake 840 ml  Output 750 ml  Net 90 ml    Physical Exam Constitutional:      General: He is not in acute distress.    Appearance: He is well-developed. He is not ill-appearing, toxic-appearing or diaphoretic.  Cardiovascular:     Rate and Rhythm: Normal rate and regular rhythm.  Pulmonary:     Effort: Pulmonary effort is normal. No accessory muscle usage or respiratory distress.     Breath sounds: Wheezing present.     Comments: Normal work of breathing. Moderate diffuse end-expiratory wheezes Musculoskeletal:     Right foot: Tenderness present. No swelling.     Left foot: No swelling.     Comments: Some tenderness to dorsal aspect of right foot. No erythema or appreciable swelling  Neurological:     Mental Status: He is alert.     Assessment/Plan:  Principal Problem:   AKI (acute kidney injury) (HCC) Active Problems:   Endocarditis   Urinary retention   Hyperkalemia   Microcytic anemia   MRSA infection   Epidural abscess   MRSA Tricuspid Endocarditis complicated by septic pulmonary  emboli and epidural abscess s/p I&D with laminectomy 1/29 -Evaluated by Dr. 2/29 2/4 for possible angiovac however is high risk due to PFO. Would need a balloon occlusion of the PFO if he were to do the angiovac.  -blood cultures negative to date Plan -continue daptomycin to complete 8w treatment.  -last CK normal on 2/1 -tenuous PIV access, so will refer for central line in the coming week. A PICC may be too risky given recent need for HD, we will discuss this with nephrology service. -pain management with oxycodone 10mg  q6h -PRN Albuterol   Acute renal failure 2/2 endocarditis now off HD. HD line removed 2/4. Continues to have good urine output. Will continue to monitor with strict I/Os.   Anemia of chronic disease, Anemia of critical illness. Hemoglobin stable.  Thrombocytopenia improving -continue to monitor   Hypertension. Remains HTNsive on Amlodipine 5 and Coreg. Continue coreg. Starting increased amlodipine dose of 10mg  today. -monitor for improvement    LOS: 10 days   Cliffton Asters, Medical Student 05/31/2020, 6:44 AM

## 2020-06-01 ENCOUNTER — Inpatient Hospital Stay (HOSPITAL_COMMUNITY): Payer: Medicaid Other

## 2020-06-01 DIAGNOSIS — F1190 Opioid use, unspecified, uncomplicated: Secondary | ICD-10-CM

## 2020-06-01 LAB — RENAL FUNCTION PANEL
Albumin: 1.7 g/dL — ABNORMAL LOW (ref 3.5–5.0)
Anion gap: 8 (ref 5–15)
BUN: 52 mg/dL — ABNORMAL HIGH (ref 6–20)
CO2: 25 mmol/L (ref 22–32)
Calcium: 8.1 mg/dL — ABNORMAL LOW (ref 8.9–10.3)
Chloride: 102 mmol/L (ref 98–111)
Creatinine, Ser: 1.53 mg/dL — ABNORMAL HIGH (ref 0.61–1.24)
GFR, Estimated: 57 mL/min — ABNORMAL LOW (ref >60)
Glucose, Bld: 101 mg/dL — ABNORMAL HIGH (ref 70–99)
Phosphorus: 4.2 mg/dL (ref 2.5–4.6)
Potassium: 4.7 mmol/L (ref 3.5–5.1)
Sodium: 135 mmol/L (ref 135–145)

## 2020-06-01 LAB — CK: Total CK: 14 U/L — ABNORMAL LOW (ref 49–397)

## 2020-06-01 IMAGING — CT CT HEAD W/O CM
4 series · 17 of 47 positions shown, 19 images · non-contrast
Comparison: None.

CLINICAL DATA: Headache, classic migraine

EXAM:
CT HEAD WITHOUT CONTRAST
TECHNIQUE: Contiguous axial images were obtained from the base of the skull
through the vertex without intravenous contrast.

[Series 3: head bone · axial · 0.43mm/px · z∈[-102,-50]mm · 4 of 76 slices shown]
[im 8/76  bone]
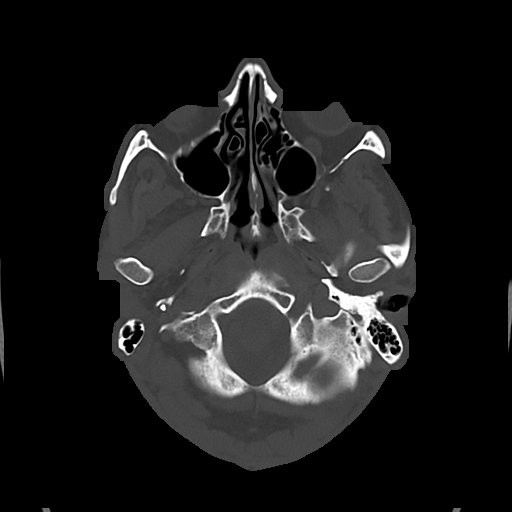
[im 16/76  bone]
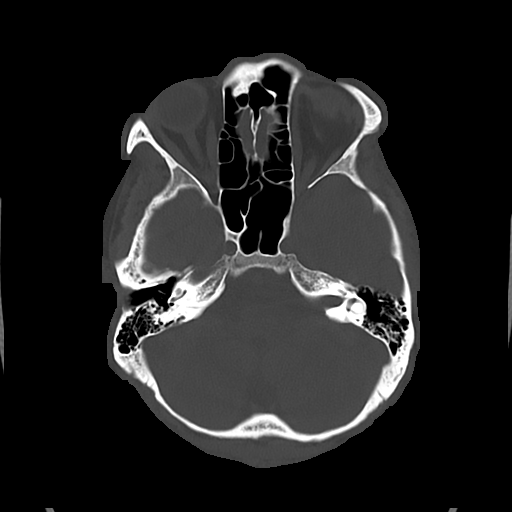
[im 23/76  bone]
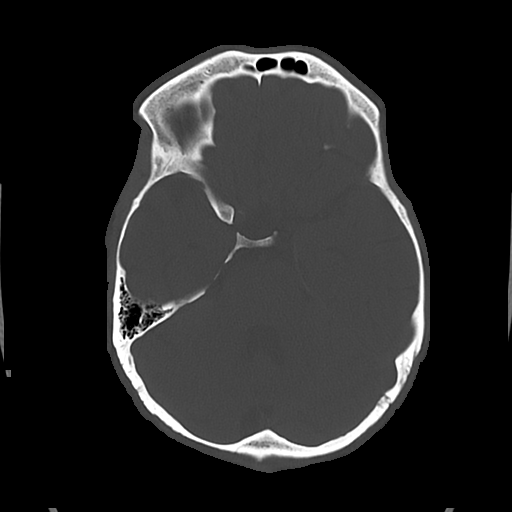
[im 34/76  bone]
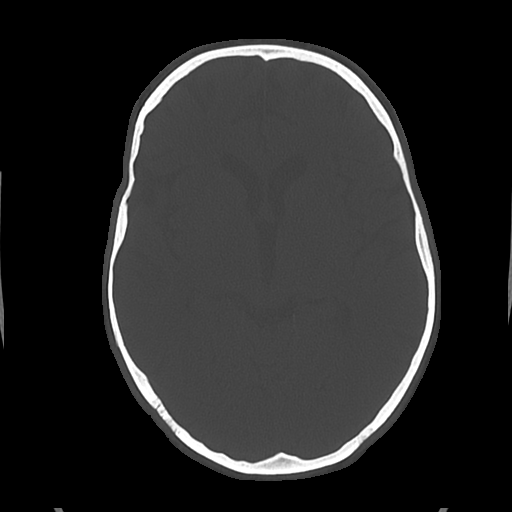

[Series 4: head wo · axial · 0.43mm/px · z∈[-101,+14]mm · 7 of 31 slices shown, 9 images]
[im 4/31  brain]
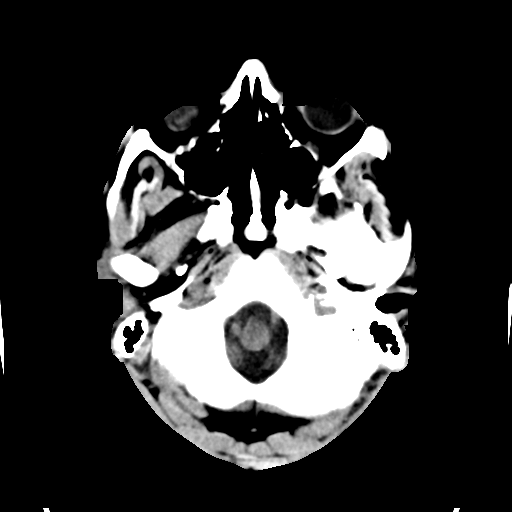
[im 4/31  bone]
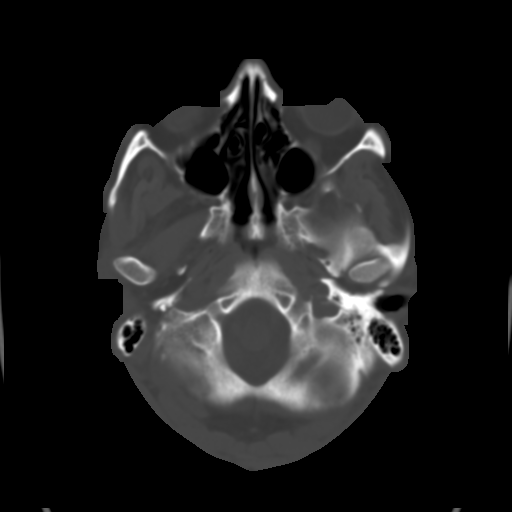
[im 8/31  brain]
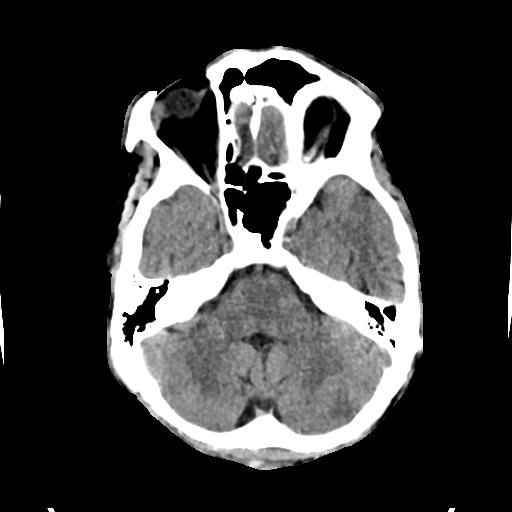
[im 12/31  brain]
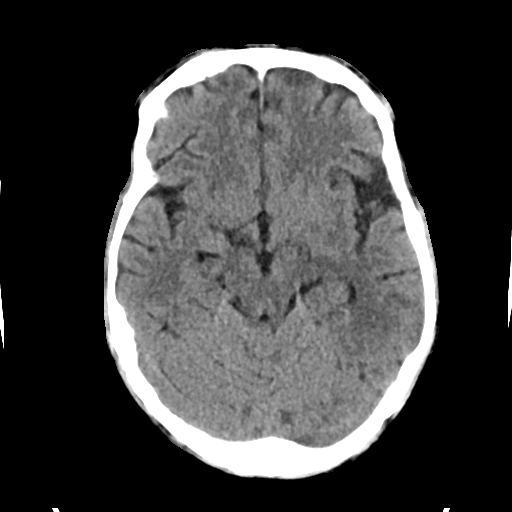
[im 16/31  brain]
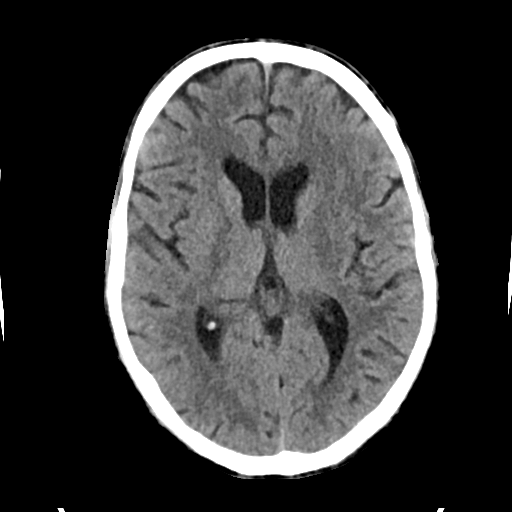
[im 19/31  brain]
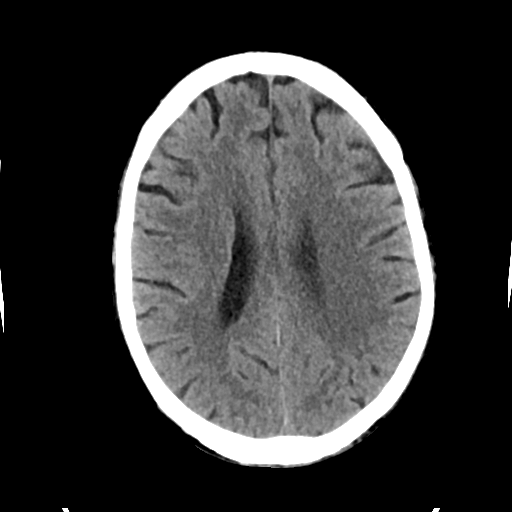
[im 19/31  bone]
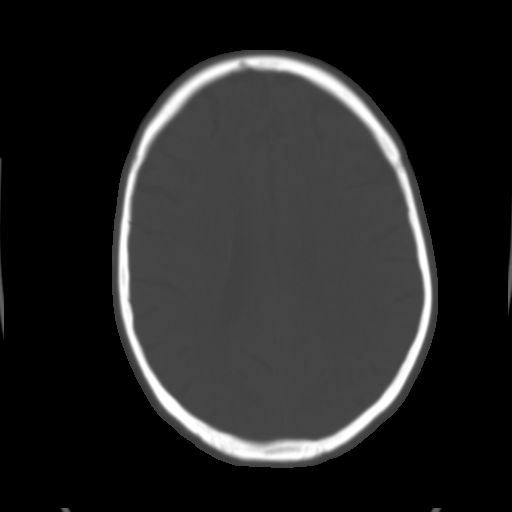
[im 23/31  brain]
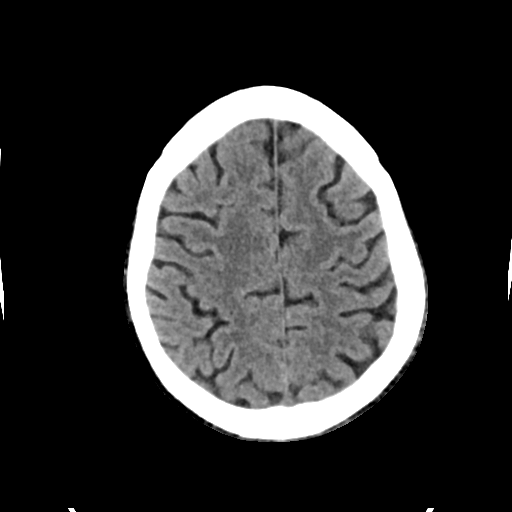
[im 27/31  brain]
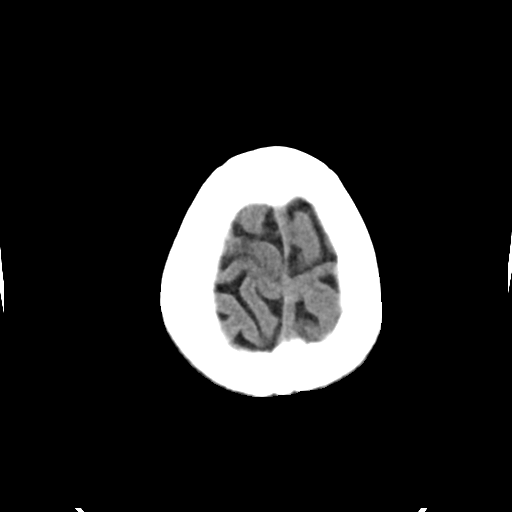

[Series 5: cor soft · coronal · 0.33mm/px · 3 of 67 slices shown]
[im 23/67  brain]
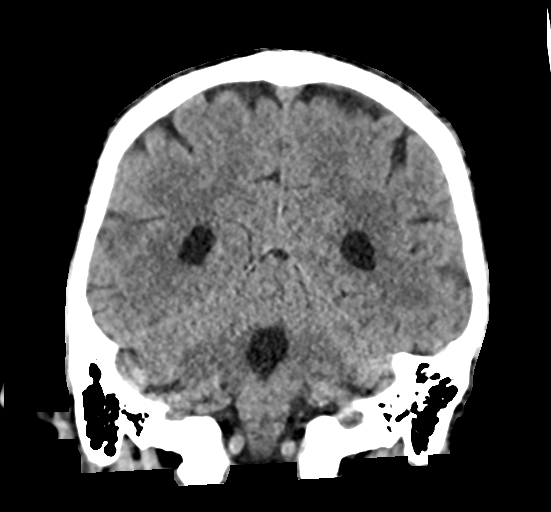
[im 30/67  brain]
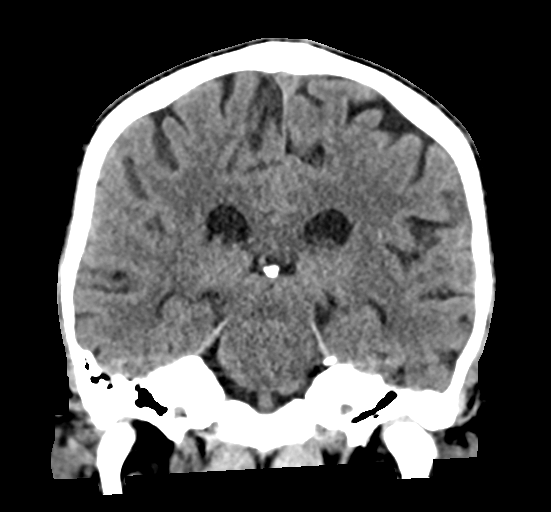
[im 37/67  brain]
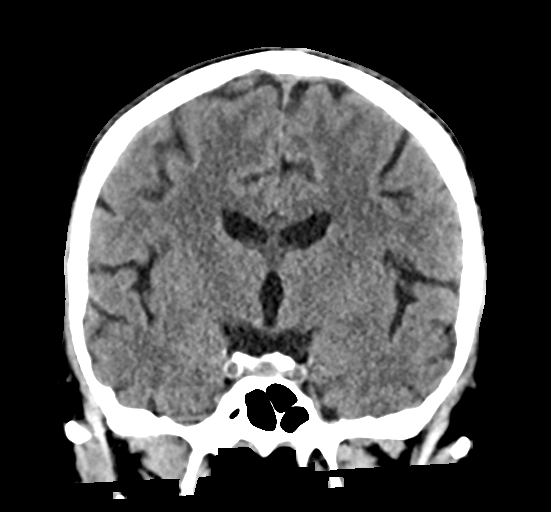

[Series 6: sag soft · sagittal · 0.35mm/px · 3 of 55 slices shown]
[im 19/55  brain]
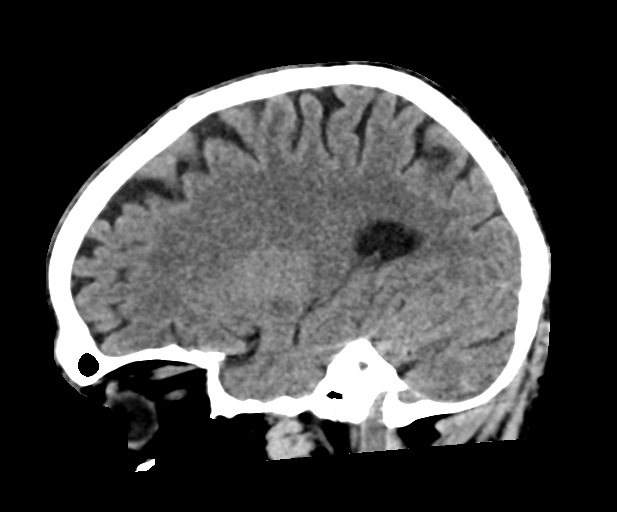
[im 28/55  brain]
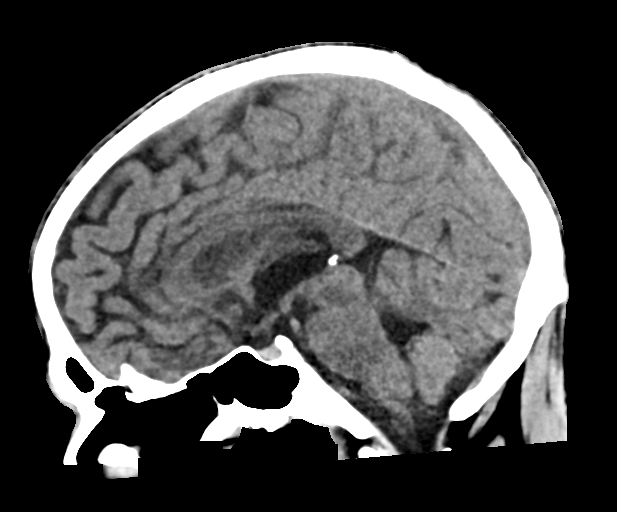
[im 37/55  brain]
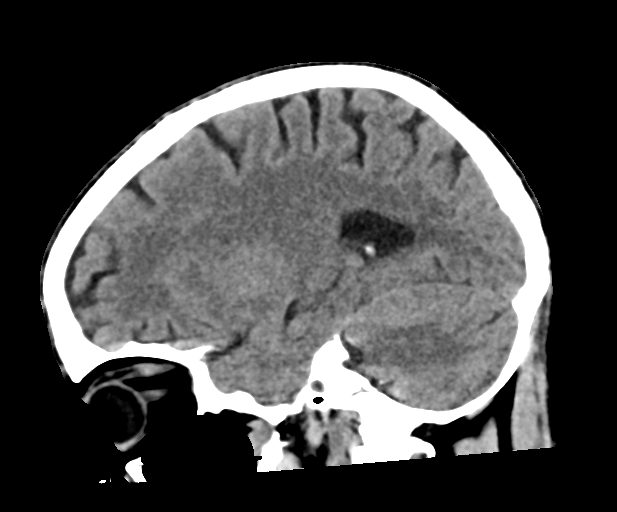

[17 of 47 positions shown; findings below may reference images not displayed]

FINDINGS: Brain: No evidence of acute infarction, hemorrhage, hydrocephalus,
extra-axial collection or mass lesion/mass effect.

Vascular: Negative for hyperdense vessel

Skull: Negative

Sinuses/Orbits: Paranasal sinuses clear.  Negative orbit

Other: None
IMPRESSION: Negative CT head

## 2020-06-01 MED ORDER — OXYCODONE HCL 5 MG PO TABS
10.0000 mg | ORAL_TABLET | Freq: Four times a day (QID) | ORAL | Status: DC | PRN
  Administered 2020-06-01 – 2020-06-02 (×3): 10 mg via ORAL
  Filled 2020-06-01 (×2): qty 2

## 2020-06-01 MED ORDER — ENOXAPARIN SODIUM 40 MG/0.4ML ~~LOC~~ SOLN
40.0000 mg | Freq: Every day | SUBCUTANEOUS | Status: DC
  Administered 2020-06-01 – 2020-06-14 (×14): 40 mg via SUBCUTANEOUS
  Filled 2020-06-01 (×14): qty 0.4

## 2020-06-01 NOTE — Progress Notes (Signed)
Physical Therapy Treatment Patient Details Name: Steven Cortez MRN: [id number] DOB: 1975/12/02 Today's Date: 06/01/2020    History of Present Illness Pt is a 45 year old male with PMHx including Hep C and IVDU who presented with back pain, weakness, dyspnea, pleuritic chest pain, urinary retention, and pitting edema. He was recently admitted to [facility] and found to have tricuspid endocarditis with MRSA bacteremia, and pulmonary septic emboli but left AMA. 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 s/pTEE 05/28/20.    PT Comments    Pt reporting improved RLE radicular pain, however, increased "back stiffness." Ambulating x 60 feet with a walker at a supervision level. Further distance limited as pt lunch tray delivered. Will benefit from continued acute PT to promote mobility.     Follow Up Recommendations  No PT follow up     Equipment Recommendations  Rolling walker with 5" wheels    Recommendations for Other Services       Precautions / Restrictions Precautions Precautions: Fall Precaution Comments: utilized back precautions for comfort Restrictions Weight Bearing Restrictions: No    Mobility  Bed Mobility Overal bed mobility: Modified Independent                Transfers Overall transfer level: Modified independent Equipment used: Rolling walker (2 wheeled)                Ambulation/Gait Ambulation/Gait assistance: Supervision Gait Distance (Feet): 60 Feet Assistive device: Rolling walker (2 wheeled) Gait Pattern/deviations: Step-through pattern;Decreased stride length Gait velocity: decreased   General Gait Details: Cues for scapular retraction, hip extension. Supervision for safety   Stairs             Wheelchair Mobility    Modified Rankin (Stroke Patients Only)       Balance Overall balance  assessment: Needs assistance Sitting-balance support: Feet supported Sitting balance-Leahy Scale: Good     Standing balance support: No upper extremity supported;During functional activity Standing balance-Leahy Scale: Fair                              Cognition Arousal/Alertness: Awake/alert Behavior During Therapy: WFL for tasks assessed/performed Overall Cognitive Status: Within Functional Limits for tasks assessed                                        Exercises      General Comments        Pertinent Vitals/Pain Pain Assessment: Faces Faces Pain Scale: Hurts even more Pain Location: back, RLE Pain Descriptors / Indicators: Discomfort;Sharp;Shooting Pain Intervention(s): Monitored during session    Home Living                      Prior Function            PT Goals (current goals can now be found in the care plan section) Acute Rehab PT Goals Potential to Achieve Goals: Good Progress towards PT goals: Progressing toward goals    Frequency    Min 3X/week      PT Plan Current plan remains appropriate    Co-evaluation              AM-PAC PT "6 Clicks" Mobility   Outcome Measure  Help needed turning from your back to your side while in a flat bed without using bedrails?: None Help needed moving from lying on your back to sitting on the side of a flat bed without using bedrails?: None Help needed moving to and from a bed to a chair (including a wheelchair)?: None Help needed standing up from a chair using your arms (e.g., wheelchair or bedside chair)?: None Help needed to walk in hospital room?: None Help needed climbing 3-5 steps with a railing? : A Little 6 Click Score: 23    End of Session   Activity Tolerance: Patient tolerated treatment well Patient left: in bed;with call bell/phone within reach Nurse Communication: Mobility status PT Visit Diagnosis: Unsteadiness on feet (R26.81);Pain Pain -  Right/Left: Right Pain - part of body: Leg     Time: 1153-1202 PT Time Calculation (min) (ACUTE ONLY): 9 min  Charges:  $Gait Training: 8-22 mins                     Lillia Pauls, PT, DPT Acute Rehabilitation Services Pager [phone] Office [phone]    Norval Morton 06/01/2020, 2:11 PM

## 2020-06-01 NOTE — Progress Notes (Addendum)
   Subjective:  Patient reports constant, posterior headache of 1-2 days durations. Denies hx of headache. No neck pain, vision changes, or chest pain. He conveys has been using IV drugs on-and-off for several years.  He reports has tried methadone in the past, many years ago. Has never tried suboxone, expressed might be interested in it in near future.   Objective:  Vital signs in last 24 hours: Vitals:   05/31/20 1556 05/31/20 2110 06/01/20 0448 06/01/20 1137  BP: (!) 146/91 (!) 165/95 (!) 165/101 (!) 164/98  Pulse: 91 87 90 (!) 42  Resp:  20 18 18   Temp:  99.5 F (37.5 C) 98.2 F (36.8 C) 97.8 F (36.6 C)  TempSrc:  Oral Oral Oral  SpO2:  97% 95% 97%  Weight:   79.9 kg   Height:       Weight change: -0.422 kg  Intake/Output Summary (Last 24 hours) at 06/01/2020 1211 Last data filed at 06/01/2020 0451 Gross per 24 hour  Intake 600 ml  Output 1500 ml  Net -900 ml    Physical Exam Constitutional:      General: He is not in acute distress.    Appearance: He is not toxic-appearing or diaphoretic.     Comments: Patient lying comfortably in bed, NAD  Cardiovascular:     Rate and Rhythm: Normal rate and regular rhythm.  Pulmonary:     Effort: Pulmonary effort is normal. No respiratory distress.  Neurological:     General: No focal deficit present.     Mental Status: He is alert.     Cranial Nerves: Cranial nerves are intact. No cranial nerve deficit, dysarthria or facial asymmetry.     Motor: No weakness.     Coordination: Finger-Nose-Finger Test normal.      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 2/29 2/4 for possible angiovac however is high risk due to PFO. Would need  a balloon occlusion of the PFO if he were to do the angiovac.  -blood cultures negative to date Plan -continue daptomycin to complete 8w treatment. -last CK normal on 2/7 -tenuous PIV access, so will refer for central line this week. A PICC may be too risky given recent need for HD. Discussed with Nephrology service; agreable with risk-benefit analysis. Consulted with CT Surg, f/u recs -pain management with oxycodone 10mg  q6h -PRN Albuterol   Occipital Headache Patient reporting new-onset, constant posterior headache starting 1-2 days ago. Neurologically appropriate on exam. No vision changes or neck pain, but further workup required in context of his tricuspid endocarditis with potential for embolic manifestations. CT head w/o contrast normal.    Acute renal failure 2/2 endocarditisnow off HD. HD line removed 2/4.Continues to have good urine output. Will continue to monitor with strict I/Os.   Anemia of chronic disease, Anemia of critical illness. Hemoglobin stable.  Thrombocytopenia improving -continue to monitor   Hypertension.Remains HTNsive on Amlodipine 5 and Coreg. Continue coreg. Started increased amlodipine dose of 10mg  2/6. Remains hypertensive -monitor for improvement   Opioid use disorder Long history. Injects Roxicodone. Wean patient off of oxycodone and start suboxone inpatient when patient is ready.     LOS: 11 days   4/7, Medical Student 06/01/2020, 12:11 PM

## 2020-06-02 ENCOUNTER — Inpatient Hospital Stay: Payer: Self-pay

## 2020-06-02 ENCOUNTER — Inpatient Hospital Stay (HOSPITAL_COMMUNITY): Payer: Medicaid Other

## 2020-06-02 DIAGNOSIS — D696 Thrombocytopenia, unspecified: Secondary | ICD-10-CM

## 2020-06-02 DIAGNOSIS — B9561 Methicillin susceptible Staphylococcus aureus infection as the cause of diseases classified elsewhere: Secondary | ICD-10-CM

## 2020-06-02 LAB — RENAL FUNCTION PANEL
Albumin: 1.7 g/dL — ABNORMAL LOW (ref 3.5–5.0)
Anion gap: 10 (ref 5–15)
BUN: 52 mg/dL — ABNORMAL HIGH (ref 6–20)
CO2: 22 mmol/L (ref 22–32)
Calcium: 7.9 mg/dL — ABNORMAL LOW (ref 8.9–10.3)
Chloride: 103 mmol/L (ref 98–111)
Creatinine, Ser: 1.45 mg/dL — ABNORMAL HIGH (ref 0.61–1.24)
GFR, Estimated: >60 mL/min (ref >60)
Glucose, Bld: 105 mg/dL — ABNORMAL HIGH (ref 70–99)
Phosphorus: 4.2 mg/dL (ref 2.5–4.6)
Potassium: 4.9 mmol/L (ref 3.5–5.1)
Sodium: 135 mmol/L (ref 135–145)

## 2020-06-02 IMAGING — MR MR HEAD W/O CM
8 of 10 series · 35 of 48 positions shown · non-contrast
Comparison: None.

CLINICAL DATA: Headache.  History of IV drug use.

EXAM:
MRI HEAD WITHOUT CONTRAST
TECHNIQUE: Multiplanar, multiecho pulse sequences of the brain and surrounding
structures were obtained without intravenous contrast.

[Series 3: DWI · axial · 3.0mm · 1.09mm/px · z∈[-76,+76]mm · 8 of 104 slices shown (1 of 4)]
[im 1/104]
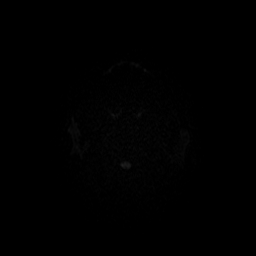
[im 12/104]
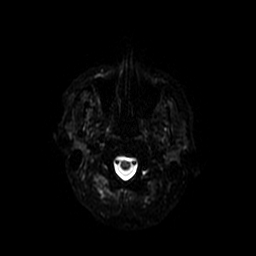
[im 35/104]
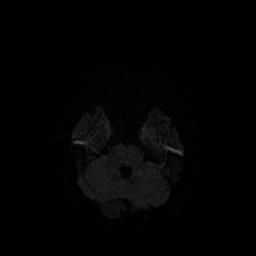
[im 46/104]
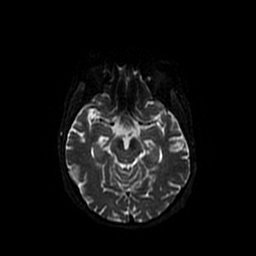
[im 58/104]
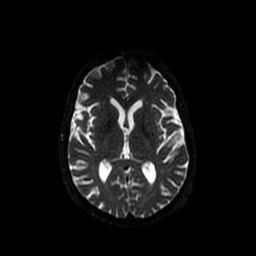
[im 69/104]
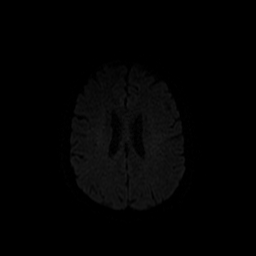
[im 92/104]
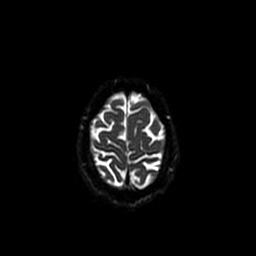
[im 104/104]
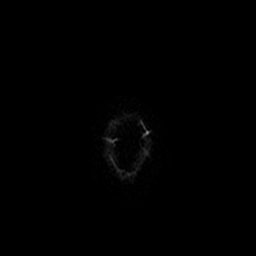

[Series 4: DWI · coronal · 5.0mm · 1.09mm/px · 7 of 72 slices shown (2 of 4)]
[im 1/72]
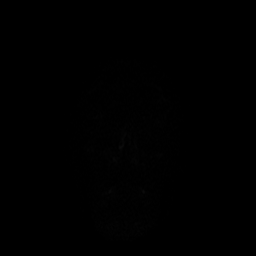
[im 12/72]
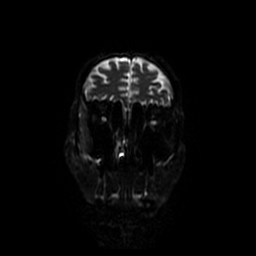
[im 24/72]
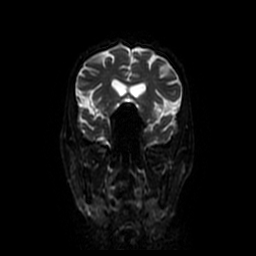
[im 36/72]
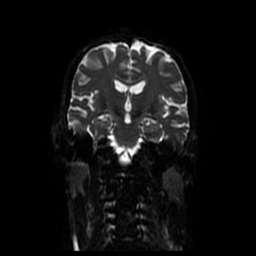
[im 48/72]
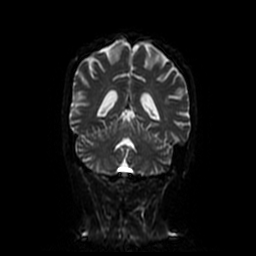
[im 60/72]
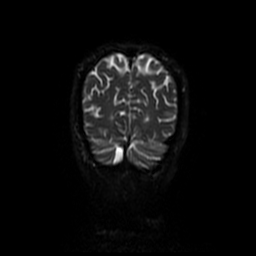
[im 72/72]
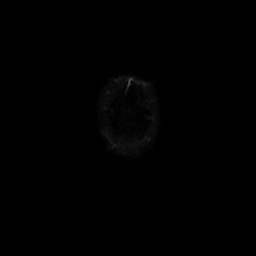

[Series 5: FLAIR · axial · 3.0mm · 0.43mm/px · z∈[-73,+76]mm · 2 of 26 slices shown]
[im 1/26]
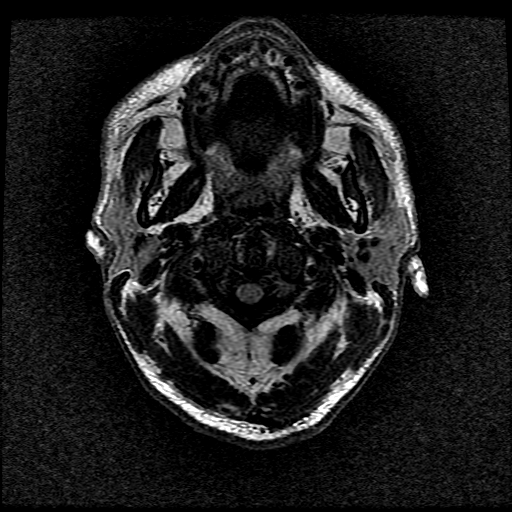
[im 26/26]
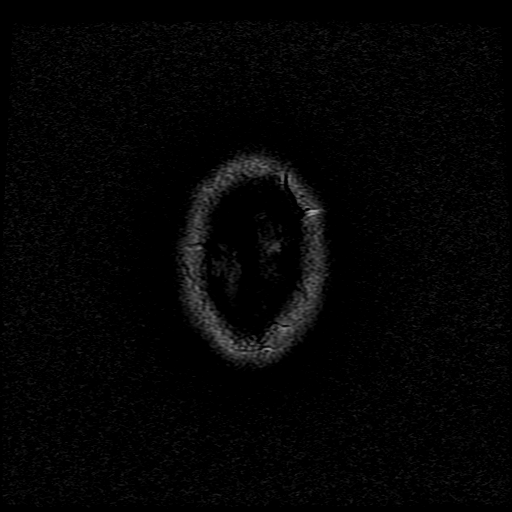

[Series 6: T1 · axial · 3.0mm · 0.47mm/px · z∈[-81,-16]mm · 4 of 112 slices shown]
[im 1/112]
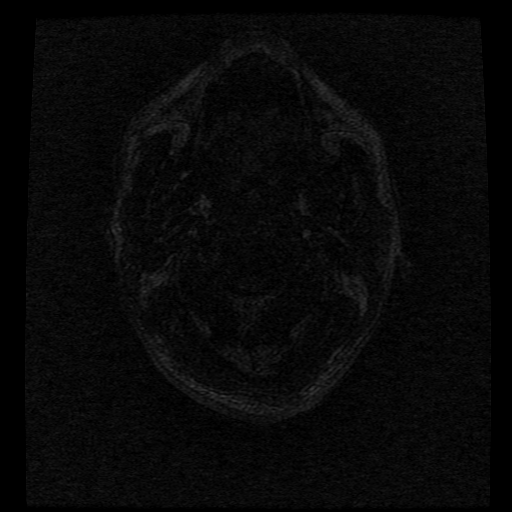
[im 23/112]
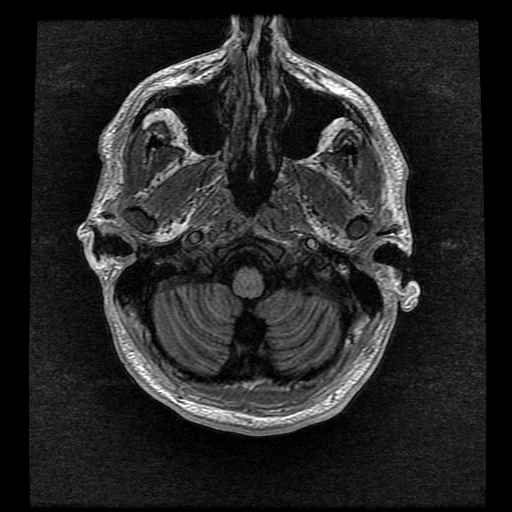
[im 34/112]
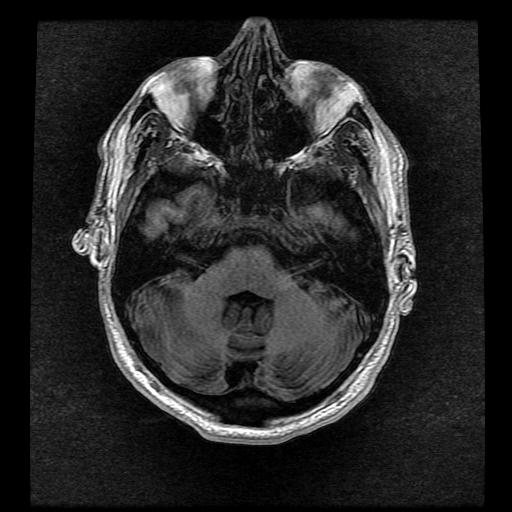
[im 45/112]
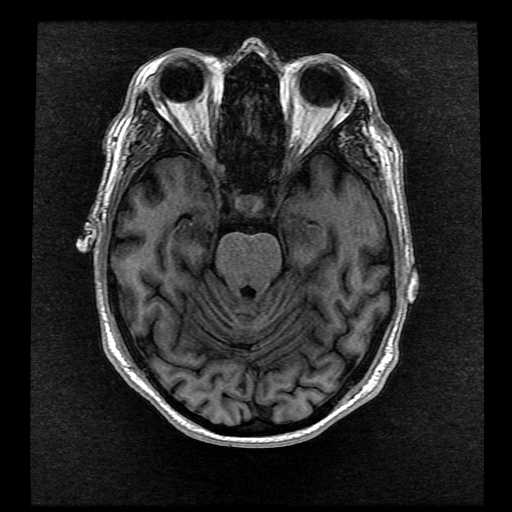

[Series 8: T2 · axial · 5.0mm · 0.43mm/px · z∈[-73,+76]mm · 3 of 31 slices shown (1 of 2)]
[im 1/31]
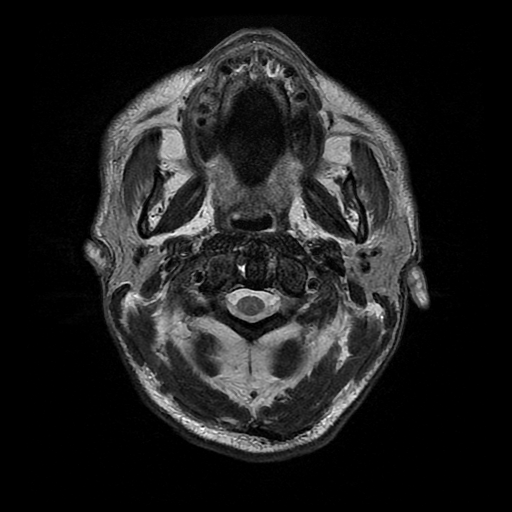
[im 16/31]
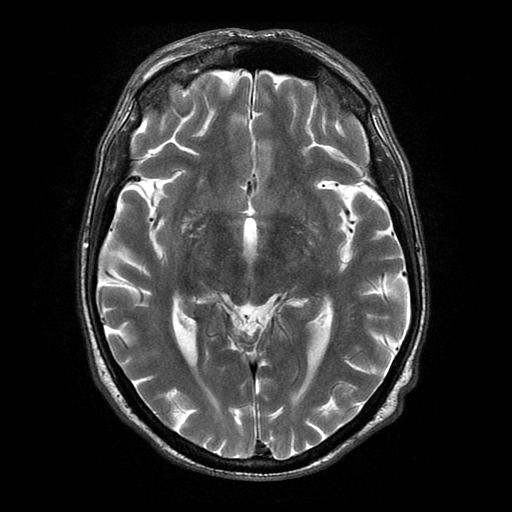
[im 31/31]
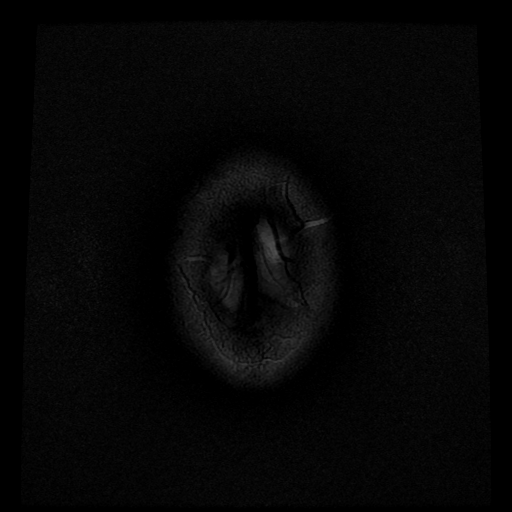

[Series 10: T2 · coronal · 5.0mm · 0.39mm/px · 3 of 29 slices shown (2 of 2)]
[im 1/29]
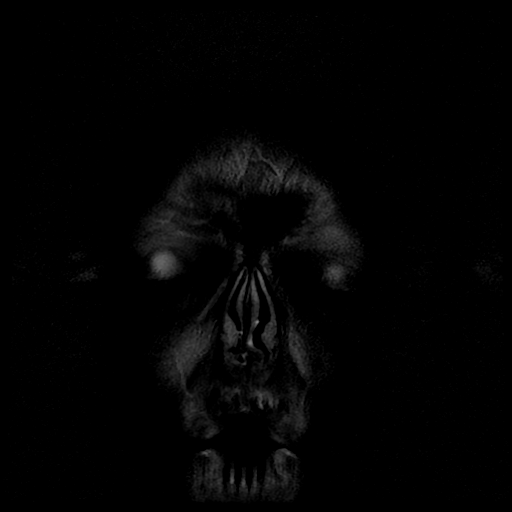
[im 15/29]
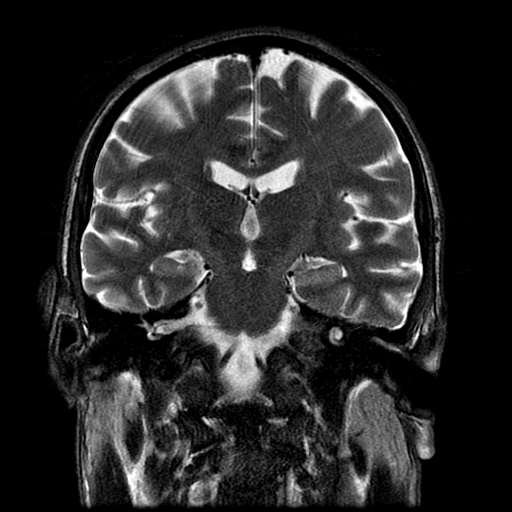
[im 29/29]
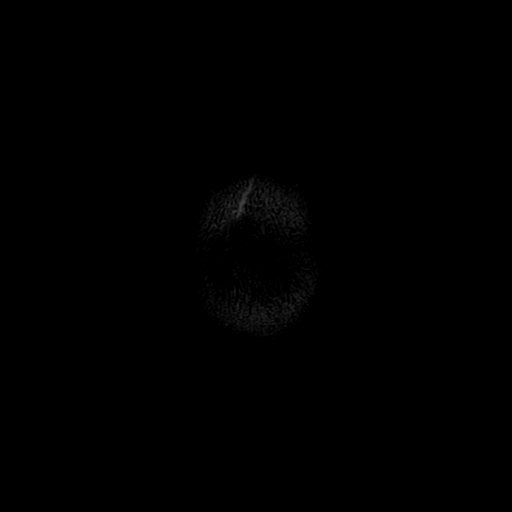

[Series 300: DWI · axial · 3.0mm · 1.09mm/px · z∈[-76,+76]mm · 5 of 52 slices shown (3 of 4)]
[im 1/52]
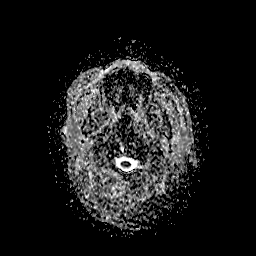
[im 13/52]
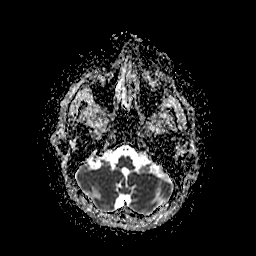
[im 26/52]
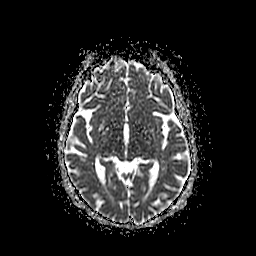
[im 39/52]
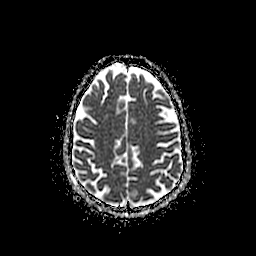
[im 52/52]
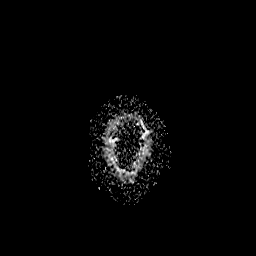

[Series 400: DWI · coronal · 5.0mm · 1.09mm/px · 3 of 36 slices shown (4 of 4)]
[im 1/36]
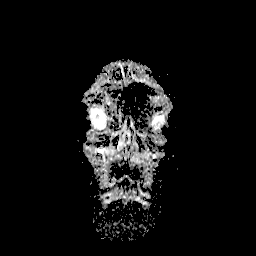
[im 18/36]
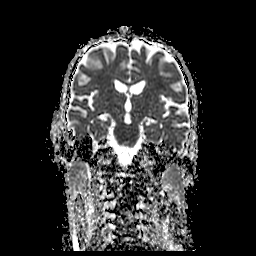
[im 36/36]
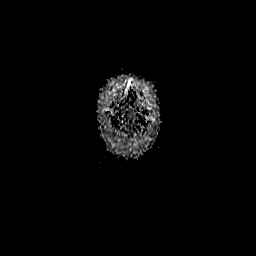

[35 of 48 positions shown; findings below may reference images not displayed]

FINDINGS: Brain: No acute infarct, mass effect or extra-axial collection. No
acute or chronic hemorrhage. Normal white matter signal, parenchymal
volume and CSF spaces. The midline structures are normal.

Vascular: Major flow voids are preserved.

Skull and upper cervical spine: Normal calvarium and skull base.
Visualized upper cervical spine and soft tissues are normal.

Sinuses/Orbits:No paranasal sinus fluid levels or advanced mucosal
thickening. No mastoid or middle ear effusion. Normal orbits.
IMPRESSION: Normal brain MRI.

## 2020-06-02 IMAGING — MR MR LUMBAR SPINE W/O CM
4 of 5 series · 17 of 48 positions shown · non-contrast
Comparison: Previous MRI from [DATE].

CLINICAL DATA: Follow-up examination for epidural abscess,
worsening back pain.

EXAM:
MRI LUMBAR SPINE WITHOUT CONTRAST
TECHNIQUE: Multiplanar, multisequence MR imaging of the lumbar spine was
performed. No intravenous contrast was administered.

[Series 3: T2 · sagittal · 4.0mm · 0.55mm/px · 6 of 13 slices shown (1 of 2)]
[im 1/13]
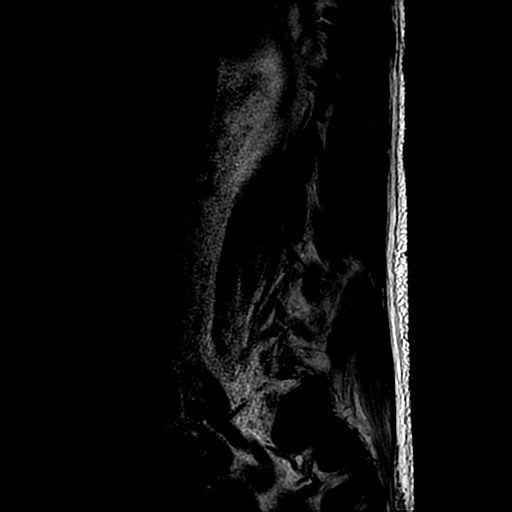
[im 3/13]
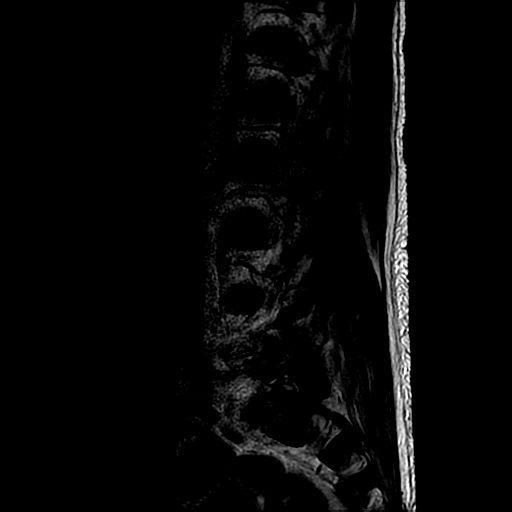
[im 5/13]
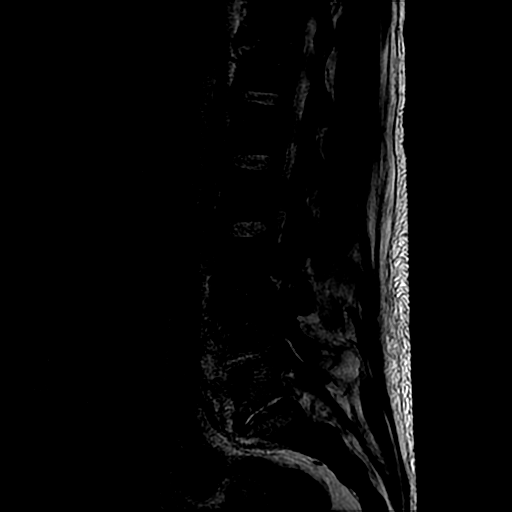
[im 8/13]
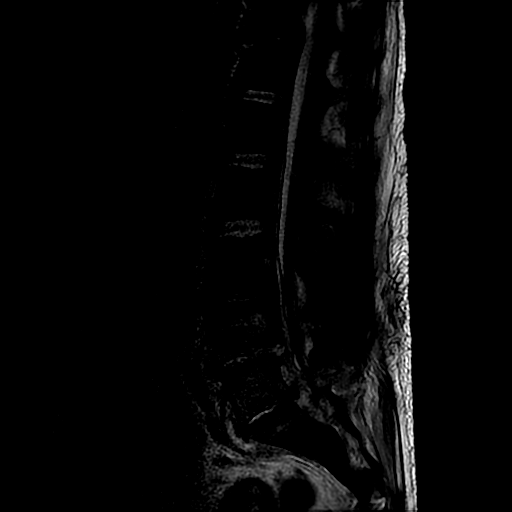
[im 10/13]
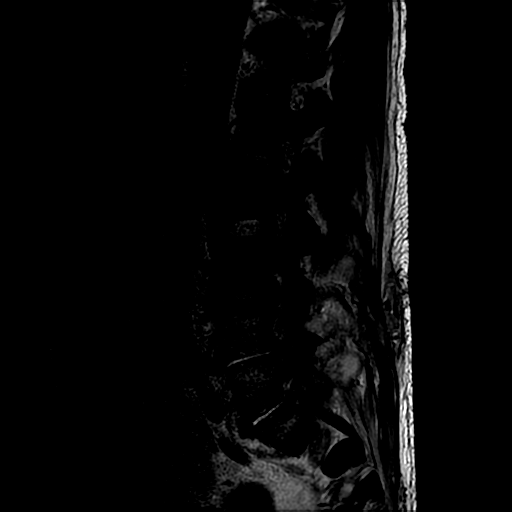
[im 13/13]
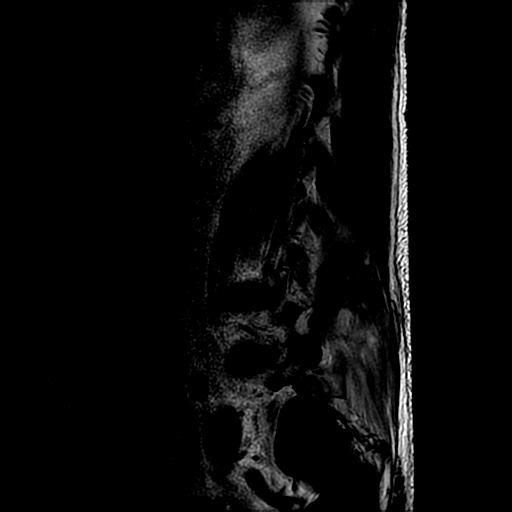

[Series 4: T1 · sagittal · 4.0mm · 0.55mm/px · 3 of 13 slices shown (1 of 2)]
[im 3/13]
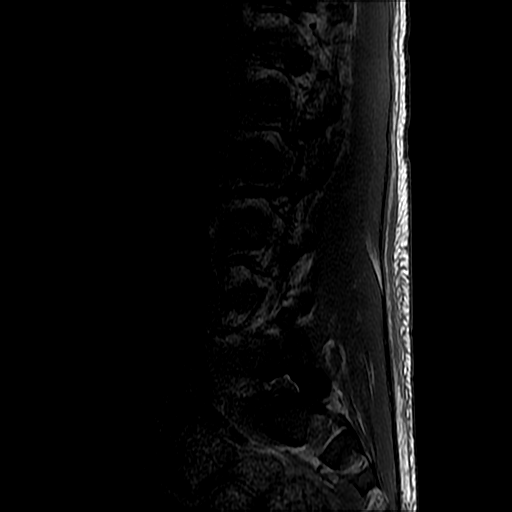
[im 8/13]
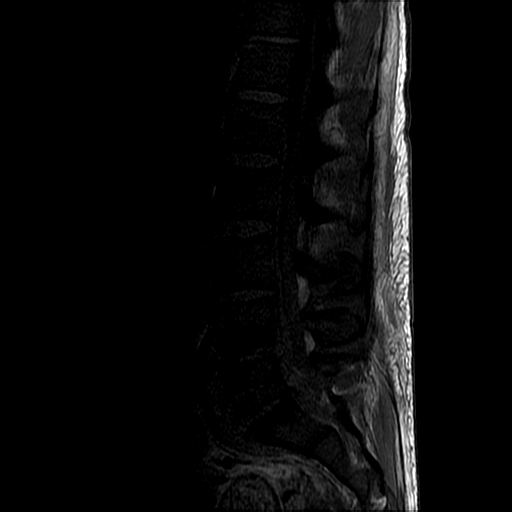
[im 13/13]
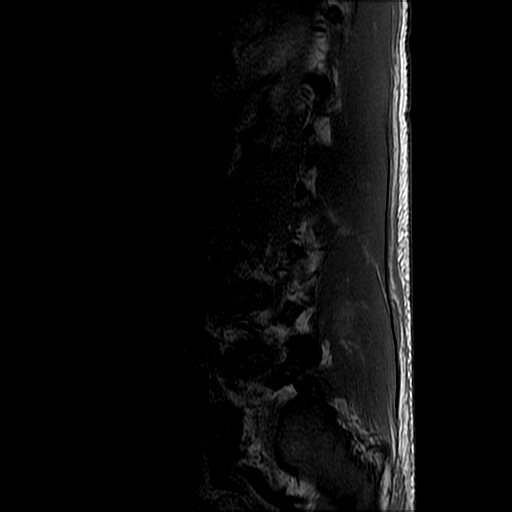

[Series 6: T2 · axial · 4.0mm · 0.39mm/px · z∈[-149,+11]mm · 5 of 32 slices shown (2 of 2)]
[im 1/32]
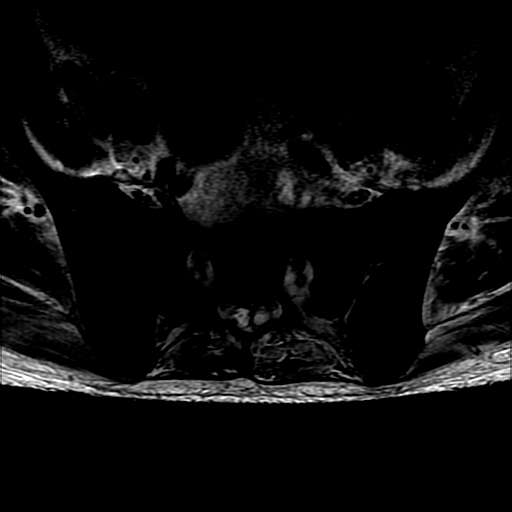
[im 5/32]
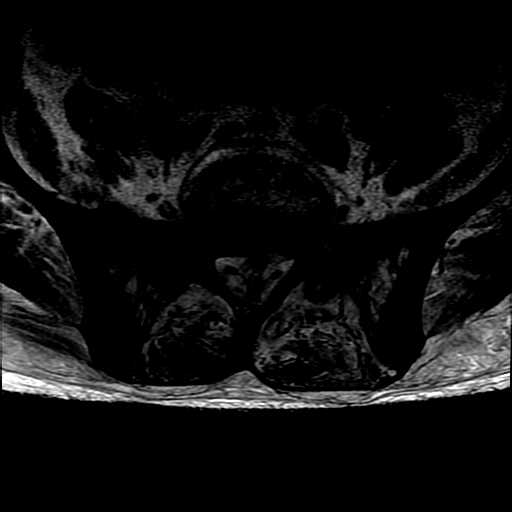
[im 9/32]
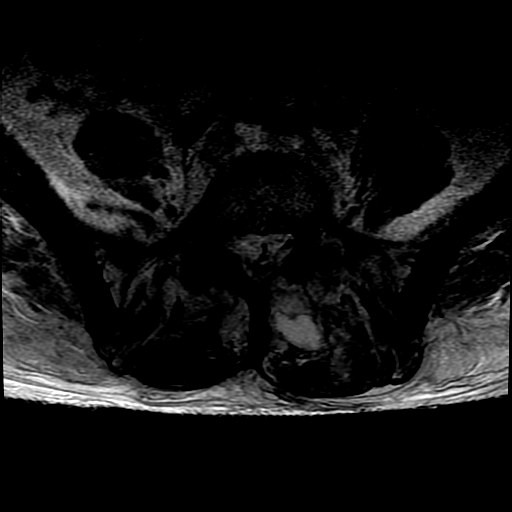
[im 16/32]
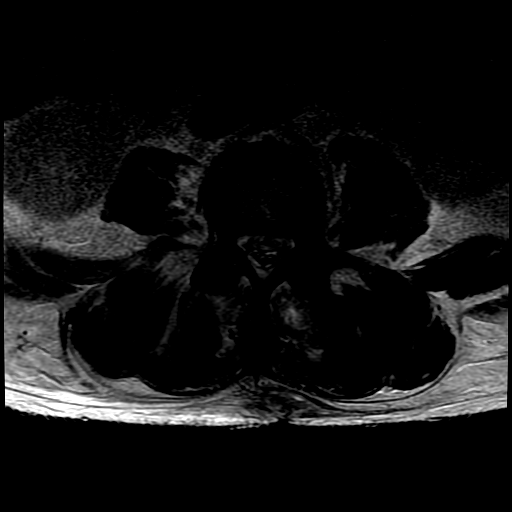
[im 27/32]
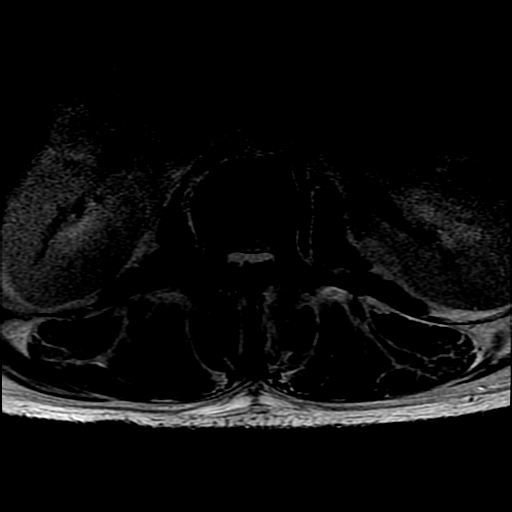

[Series 7: T1 · axial · 4.0mm · 0.39mm/px · z∈[-130,+11]mm · 3 of 32 slices shown (2 of 2)]
[im 5/32]
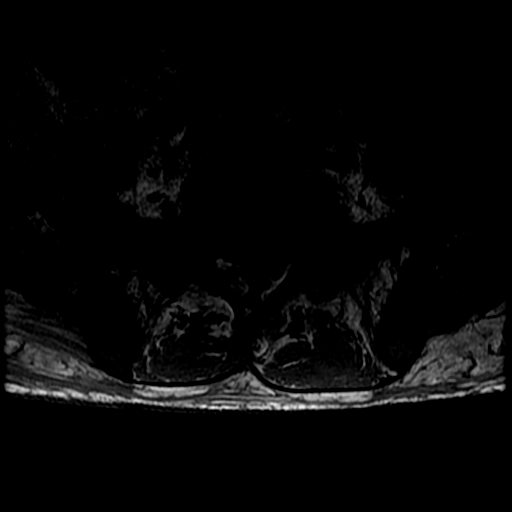
[im 16/32]
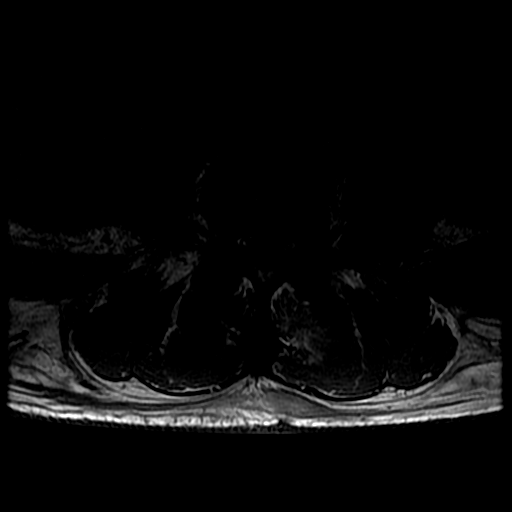
[im 27/32]
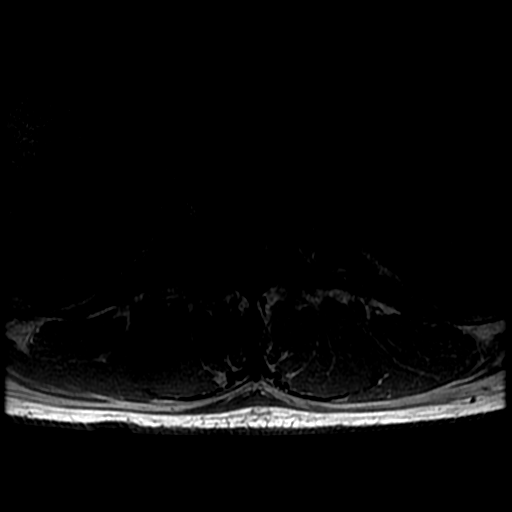

[17 of 48 positions shown; findings below may reference images not displayed]

FINDINGS: Segmentation: Standard. Lowest well-formed disc space labeled the
L5-S1 level. Same numbering system utilized as on previous exam.

Alignment:  Physiologic.  No interval listhesis.

Vertebrae: Persistent findings of osteomyelitis discitis again seen
at the L4-5 and L5-S1 levels. Associated disc space height loss with
fluid signal intensity and marrow edema is relatively similar.
Changes of septic arthritis about the L4-5 and L5-S1 facets again
noted as well, also relatively similar. There has been interval
performance of decompressive laminectomies on the left at L4-5 and
L5-S1. Fluid collections at the laminectomy sites measure 3.2 x
x 1.9 cm at the level of L4-5, and 3.4 x 1.7 x 1.9 cm at the level
of L5-S1. While these findings may be postoperative in nature,
possible abscess may also be present. No other new sites of
infection elsewhere within the lumbar spine.

Conus medullaris and cauda equina: Conus extends to the T12 level.
Visualized conus medullaris within normal limits. Previously seen
ventral epidural fluid collection is slightly decreased and improved
from prior exam, now extending cephalad to approximately the L1-2
level, previously T11-12. Overall size and mass effect is slightly
decreased and improved. There remains a more circumferential
collection at the levels of L4-5 and L5-S1 with secondary
compression of the nerve roots of the cauda equina, relatively
similar to previous exam.

Paraspinal and other soft tissues: Postoperative changes with
superimposed collections at the laminectomy sites as above. Edema
throughout the posterior paraspinous soft tissues is increased,
suspected to at least in part be postoperative in nature.
Cresenteric collections along the anterior margins of the L4-5 and
L5-S1 interspaces are now more well-defined and organized, measuring
3.5 x 1.2 x 2.5 cm at the level of L4-5, and 5.7 x 0.6 x 3.0 cm at
the level of L5-S1. There is right-sided hydronephrosis, which may
be increased from prior ultrasound.

Disc levels:

L1-2: Tiny residual ventral epidural collection without significant
stenosis.

L2-3: Tiny residual ventral epidural collection, improved from
previous. Probable small subdural and/or intradural component with
mild compression of the nerve roots of the cauda equina (series 6,
image 10).

L3-4: Disc desiccation with diffuse disc bulge. Circumferential
epidural collection with mild to moderate spinal stenosis. Mild
bilateral L3 foraminal narrowing.

L4-5: Changes of osteomyelitis discitis and septic arthritis.
Interval decompressive left hemi laminectomy. Persistent
circumferential epidural collection with collection at the
laminectomy site as above. Persistent moderate to severe spinal
stenosis, similar to previous. Moderate to severe right worse than
left L4 foraminal narrowing, also similar.

L5-S1: Findings consistent with osteomyelitis discitis and septic
arthritis. Interval left hemi laminectomy. Persistent
circumferential epidural collection with additional collection at
the laminectomy site. Persistent moderate to severe spinal stenosis,
mildly improved as compared to previous. Moderate left worse than
right L5 foraminal narrowing, stable.
IMPRESSION: 1. Persistent findings of osteomyelitis discitis and septic
arthritis at L4-5 and L5-S1.
2. Interval decompressive laminectomies on the left at L4-5 and
L5-S1. Persistent circumferential epidural collection at these
levels with resultant moderate to severe spinal stenosis, similar to
previous. Elsewhere, the epidural collection is overall improved.
3. Additional postoperative collections at the laminectomy sites as
above, which could be postoperative and/or infectious in nature.
4. Additional paraspinous collections along the anterior margin of
the spinal column at the levels of L4-5 and L5-S1, now more
organized as compared to previous.
5. Moderate right-sided hydronephrosis, potentially increased from
prior ultrasound. Further evaluation with dedicated renal ultrasound
suggested for direct comparison purposes.

## 2020-06-02 MED ORDER — OXYCODONE HCL 5 MG PO TABS
15.0000 mg | ORAL_TABLET | ORAL | Status: DC | PRN
  Administered 2020-06-02 – 2020-06-08 (×21): 15 mg via ORAL
  Filled 2020-06-02 (×22): qty 3

## 2020-06-02 MED ORDER — SODIUM CHLORIDE 0.9% FLUSH
10.0000 mL | INTRAVENOUS | Status: DC | PRN
Start: 2020-06-02 — End: 2020-07-15
  Administered 2020-06-14: 10 mL

## 2020-06-02 MED ORDER — OXYCODONE HCL 5 MG PO TABS: 15.0000 mg | ORAL_TABLET | Freq: Four times a day (QID) | ORAL | Status: DC | PRN

## 2020-06-02 MED ORDER — HYDROMORPHONE HCL 1 MG/ML IJ SOLN
0.5000 mg | Freq: Every day | INTRAMUSCULAR | Status: DC | PRN
  Administered 2020-06-02 – 2020-06-08 (×6): 0.5 mg via INTRAVENOUS
  Filled 2020-06-02 (×4): qty 0.5

## 2020-06-02 MED ORDER — SODIUM CHLORIDE 0.9% FLUSH
10.0000 mL | Freq: Two times a day (BID) | INTRAVENOUS | Status: DC
  Administered 2020-06-02 – 2020-06-03 (×2): 20 mL
  Administered 2020-06-03 – 2020-06-05 (×3): 10 mL
  Administered 2020-06-05: 20 mL
  Administered 2020-06-06 – 2020-06-10 (×10): 10 mL
  Administered 2020-06-11: 40 mL
  Administered 2020-06-11 – 2020-06-12 (×2): 10 mL
  Administered 2020-06-12: 40 mL
  Administered 2020-06-13 – 2020-06-18 (×11): 10 mL
  Administered 2020-06-19: 30 mL
  Administered 2020-06-19 – 2020-06-20 (×2): 10 mL
  Administered 2020-06-20: 30 mL
  Administered 2020-06-21 – 2020-06-29 (×16): 10 mL
  Administered 2020-06-30: 20 mL
  Administered 2020-07-01: 40 mL
  Administered 2020-07-01 – 2020-07-14 (×22): 10 mL

## 2020-06-02 MED ORDER — ADULT MULTIVITAMIN W/MINERALS CH
1.0000 | ORAL_TABLET | Freq: Every day | ORAL | Status: DC
  Administered 2020-06-02 – 2020-07-15 (×42): 1 via ORAL
  Filled 2020-06-02 (×43): qty 1

## 2020-06-02 MED ORDER — HYDROMORPHONE HCL 1 MG/ML IJ SOLN: 0.5000 mg | INTRAMUSCULAR | Status: DC | PRN

## 2020-06-02 MED ORDER — ENSURE ENLIVE PO LIQD
237.0000 mL | Freq: Two times a day (BID) | ORAL | Status: DC
  Administered 2020-06-02 – 2020-07-15 (×63): 237 mL via ORAL
  Filled 2020-06-02: qty 237

## 2020-06-02 NOTE — Progress Notes (Signed)
Subjective:  Patient seen near end of session with OT, seated. Reports back pain is increasing. Posterior headache resolved. Patient really desires to ambulate, but distances in PT sessions limited by pain.    Objective:  Vital signs in last 24 hours: Vitals:   06/02/20 0019 06/02/20 0417 06/02/20 0839 06/02/20 1210  BP: (!) 158/92 (!) 156/96 (!) 164/96 131/87  Pulse: 96 91 96 86  Resp: 18 18 18 18   Temp: 99.1 F (37.3 C) 98.3 F (36.8 C) 97.7 F (36.5 C)   TempSrc: Oral Oral Oral   SpO2: 93% 95% 97% 98%  Weight:  79.4 kg    Height:       Weight change: -0.479 kg  Intake/Output Summary (Last 24 hours) at 06/02/2020 1319 Last data filed at 06/02/2020 0818 Gross per 24 hour  Intake 786.82 ml  Output 2150 ml  Net -1363.18 ml   Physical Exam Constitutional:      General: He is not in acute distress.    Appearance: He is not toxic-appearing or diaphoretic.  Cardiovascular:     Rate and Rhythm: Normal rate and regular rhythm.  Pulmonary:     Effort: Pulmonary effort is normal.     Breath sounds: Rales (diffuse) present.  Neurological:     Mental Status: He is alert.    Assessment/Plan:  Principal Problem:   AKI (acute kidney injury) (HCC) Active Problems:   Endocarditis   Urinary retention   Hyperkalemia   Microcytic anemia   MRSA infection   Epidural abscess   Opioid use disorder  Steven Houchinsis a 44 y.o.malewith hx of IV drug use and untreated Hep Cwho was diagnosed with endocarditis and MRSA bacteremia at [facility], left AMA then presented to [facility] a few days later. Also found to have large epidural abscess,s/p laminectomy and evacuation by neurosurgery on1/29. Symptoms and leukocytosis are improving with antibiotics.   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 2/29 2/4 for possible angiovac however is high risk due to PFO. Would need a  balloon occlusion of the PFO if he were to do the angiovac.  -blood cultures negative to date -Patient endorsing worsening back pain and hip pain. Sessions with PT/OT limited by pain  Plan -f/u MRI lumbar spine w/o contrast -PICC line placement today -continue daptomycin to complete 8w treatment. -last CK normal on 2/7 -pain management: Increased oxycodone to 15mg  q4h, PRN dilaudid 0.5mg  daily 20-30 mins before PT/OT sessions -PRN Albuterol   Occipital Headache Patient reporting new-onset, constant posterior headache starting 1-2 days ago. Neurologically appropriate on exam. No vision changes or neck pain, but further workup required in context of his tricuspid endocarditis with potential for embolic manifestations. CT head w/o contrast normal. Patient reporting headache has resolved.   -f/u MRI brain w/o contrast   Acute renal failure 2/2 endocarditisnow off HD. HD line removed 2/4.Continues to have good urine output. Will continue to monitor with strict I/Os.   Anemia of chronic disease, Anemia of critical illness. Stable.  Thrombocytopenia -continue to monitor   Hypertension.Remains HTNsive on Amlodipine 5 and Coreg.Continue coreg.Started increasedamlodipine dose of10mg 2/6. Remains hypertensive. Potential contribution of pain.  -Monitor for improvement    Opioid use disorder Acute renal failure 2/2 endocarditisnow off HD. HD line removed 2/4.Continues to have good urine output. Creatinine down-trending. Will continue to monitor with strict I/Os.   Anemia of chronic disease, Anemia of critical illness. Hemoglobinstable. Thrombocytopeniaimproving -continue to monitor  Hypertension.Remains HTNsive on Amlodipine 5 and Coreg.Continue coreg.Started increasedamlodipine dose of10mg 2/6. Remains hypertensive. Potential contribution from pain.  -monitor for improvement   Opioid use disorder He has been on methadone in the past but would be  interested in treatment with suboxone. Will readdress this prior to discharge. Continue oxycodone prn for now.      LOS: 12 days   [name], Medical Student 06/02/2020, 1:19 PM

## 2020-06-02 NOTE — Progress Notes (Signed)
Peripherally Inserted Central Catheter Placement  The IV Nurse has discussed with the patient and/or persons authorized to consent for the patient, the purpose of this procedure and the potential benefits and risks involved with this procedure.  The benefits include less needle sticks, lab draws from the catheter, and the patient may be discharged home with the catheter. Risks include, but not limited to, infection, bleeding, blood clot (thrombus formation), and puncture of an artery; nerve damage and irregular heartbeat and possibility to perform a PICC exchange if needed/ordered by physician.  Alternatives to this procedure were also discussed.  Bard Power PICC patient education guide, fact sheet on infection prevention and patient information card has been provided to patient /or left at bedside.    PICC Placement Documentation  PICC Double Lumen 06/02/20 PICC Right Basilic 39 cm 0 cm (Active)  Indication for Insertion or Continuance of Line Prolonged intravenous therapies 06/02/20 1304  Exposed Catheter (cm) 0 cm 06/02/20 1304  Site Assessment Clean;Dry;Intact 06/02/20 1304  Lumen #1 Status Flushed;Saline locked;Blood return noted 06/02/20 1304  Lumen #2 Status Flushed;Saline locked;Blood return noted 06/02/20 1304  Dressing Type Transparent;Securing device 06/02/20 1304  Dressing Status Clean;Dry;Intact 06/02/20 1304  Antimicrobial disc in place? Yes 06/02/20 1304  Safety Lock Not Applicable 06/02/20 1304  Dressing Intervention New dressing 06/02/20 1304  Dressing Change Due 06/09/20 06/02/20 1304       Annett Fabian 06/02/2020, 1:06 PM

## 2020-06-02 NOTE — Progress Notes (Signed)
Occupational Therapy Treatment Patient Details Name: Steven Cortez MRN: [id number] DOB: 14-Nov-1975 Today's Date: 06/02/2020    History of present illness Pt is a 45 year old male with PMHx including Hep C and IVDU who presented with back pain, weakness, dyspnea, pleuritic chest pain, urinary retention, and pitting edema. He was recently admitted to [facility] and found to have tricuspid endocarditis with MRSA bacteremia, and pulmonary septic emboli but left AMA. 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 s/pTEE 05/28/20.  Pt with abscess in heart and hole in heart with possible surgery scheduled for week of Feb 14.   OT comments  Pt making very slow improvements despite having a lot of pain in his back.  Pt requiring overall min assist for adls but cannot tolerate much activity due to pain. Spoke with MDs about pain management. Pt also voiced some concern about upcoming court dates and his overall decline in health.  Pt very flat today.  Will continue to address mobility and adls.    Follow Up Recommendations  No OT follow up;Supervision - Intermittent    Equipment Recommendations  3 in 1 bedside commode    Recommendations for Other Services      Precautions / Restrictions Precautions Precautions: Fall Precaution Comments: utilized back precautions for comfort Restrictions Weight Bearing Restrictions: No       Mobility Bed Mobility Overal bed mobility: Modified Independent             General bed mobility comments: HOB elevated slightly; demonstrated good log roll technique with increased time due to pain and use of rail.  Transfers Overall transfer level: Modified independent Equipment used: Rolling walker (2 wheeled) Transfers: Sit to/from Stand Sit to Stand: Supervision Stand pivot transfers: Supervision       General transfer  comment: cues for hand placement    Balance Overall balance assessment: Needs assistance Sitting-balance support: Feet supported Sitting balance-Leahy Scale: Good     Standing balance support: No upper extremity supported;During functional activity Standing balance-Leahy Scale: Fair Standing balance comment: Pt must have walker more for pain control than the need for it for balance only.                           ADL either performed or assessed with clinical judgement   ADL Overall ADL's : Needs assistance/impaired Eating/Feeding: Independent;Sitting Eating/Feeding Details (indicate cue type and reason): Pt states appetite is improving. Grooming: Supervision/safety;Standing Grooming Details (indicate cue type and reason): Pt stood for 5 minutes.  Pt with extreme pain. Pt is safe standing and does not need help but cannot tolerate it for long             Lower Body Dressing: Minimal assistance;Sit to/from stand;Cueing for compensatory techniques Lower Body Dressing Details (indicate cue type and reason): Pt is able to dress LE with min assist but has great pain doing so requiring rest breaks. Toilet Transfer: Supervision/safety;Ambulation;Regular Teacher, adult education Details (indicate cue type and reason): Pt walked to bathroom.  no assist for transfer. Toileting- Clothing Manipulation and Hygiene: Minimal assistance;Sit to/from stand Toileting - Clothing Manipulation Details (indicate cue type and reason): assist to clean self.  Too much pain to clean self well.     Functional mobility during ADLs: Supervision/safety;Rolling walker General ADL Comments: Pt is slowly doing more but does seem to be having more  and more pain. Spoke to MDs about the pain being most limiting factor with therapy.     Vision   Vision Assessment?: No apparent visual deficits   Perception     Praxis      Cognition Arousal/Alertness: Awake/alert Behavior During Therapy: Flat  affect Overall Cognitive Status: Within Functional Limits for tasks assessed                                 General Comments: Pt overall very flat during therapy.  Not very verbal. pt has concerns about court date coming up and about new issues with his heart.  Is it time to consider something for depresson?  Psych consult?        Exercises     Shoulder Instructions       General Comments Pt limited most by pain.  Pt talked a lot about issues at home involving court dates and just his change in indepenence.    Pertinent Vitals/ Pain       Pain Assessment: 0-10 Pain Score: 8  Pain Location: back, RLE Pain Descriptors / Indicators: Discomfort;Sharp;Shooting Pain Intervention(s): Limited activity within patient's tolerance;Monitored during session;Premedicated before session;Repositioned;Patient requesting pain meds-RN notified  Home Living                                          Prior Functioning/Environment              Frequency  Min 2X/week        Progress Toward Goals  OT Goals(current goals can now be found in the care plan section)  Progress towards OT goals: Progressing toward goals  Acute Rehab OT Goals Patient Stated Goal: home, back to independent OT Goal Formulation: With patient Time For Goal Achievement: 06/07/20 Potential to Achieve Goals: Good ADL Goals Pt Will Perform Grooming: with modified independence;standing Pt Will Perform Lower Body Bathing: with modified independence;sit to/from stand;with adaptive equipment Pt Will Perform Upper Body Dressing: with modified independence;sitting Pt Will Perform Lower Body Dressing: with modified independence;sit to/from stand;with adaptive equipment Pt Will Transfer to Toilet: with modified independence;ambulating Pt Will Perform Toileting - Clothing Manipulation and hygiene: with modified independence;sit to/from stand Additional ADL Goal #1: Pt will perform bed mobility  at mod independent level as precursor to EOB/OOB ADL.  Plan Discharge plan remains appropriate    Co-evaluation                 AM-PAC OT "6 Clicks" Daily Activity     Outcome Measure   Help from another person eating meals?: None Help from another person taking care of personal grooming?: None Help from another person toileting, which includes using toliet, bedpan, or urinal?: A Little Help from another person bathing (including washing, rinsing, drying)?: A Little Help from another person to put on and taking off regular upper body clothing?: None Help from another person to put on and taking off regular lower body clothing?: A Little 6 Click Score: 21    End of Session Equipment Utilized During Treatment: Rolling walker  OT Visit Diagnosis: Other abnormalities of gait and mobility (R26.89);Muscle weakness (generalized) (M62.81);Pain Pain - part of body:  (back)   Activity Tolerance Patient limited by pain   Patient Left in bed;with call bell/phone within reach   Nurse Communication Mobility status  Time: 1947-1252 OT Time Calculation (min): 29 min  Charges: OT General Charges $OT Visit: 1 Visit OT Treatments $Self Care/Home Management : 23-37 mins   Hope Budds 06/02/2020, 10:33 AM

## 2020-06-02 NOTE — Progress Notes (Signed)
Physical Therapy Treatment Patient Details Name: Steven Cortez MRN: [id number] DOB: 10/25/1975 Today's Date: 06/02/2020    History of Present Illness Pt is a 45 year old male with PMHx including Hep C and IVDU who presented with back pain, weakness, dyspnea, pleuritic chest pain, urinary retention, and pitting edema. 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 s/pTEE 05/28/20.  Pt with abscess in heart and hole in heart with possible surgery scheduled for week of Feb 14.    PT Comments    Pt progressing slowly towards his physical therapy goals. Requiring supervision for functional mobility. Able to progress to limited hallway ambulation today (x60 ft with a walker). Reports discomfort and tightness in bilateral ankles. Did note them to be swollen; encouraged ankle pumps x 10/hour. Pt continues with decreased cardiopulmonary endurance. Will continue to progress as tolerated.     Follow Up Recommendations  No PT follow up     Equipment Recommendations  Rolling walker with 5" wheels    Recommendations for Other Services       Precautions / Restrictions Precautions Precautions: Fall Precaution Comments: utilized back precautions for comfort Restrictions Weight Bearing Restrictions: No    Mobility  Bed Mobility Overal bed mobility: Modified Independent             General bed mobility comments: HOB elevated slightly; demonstrated good log roll technique with increased time due to pain and use of rail.  Transfers Overall transfer level: Modified independent Equipment used: Rolling walker (2 wheeled)                Ambulation/Gait Ambulation/Gait assistance: Supervision Gait Distance  (Feet): 60 Feet Assistive device: Rolling walker (2 wheeled) Gait Pattern/deviations: Step-through pattern;Decreased stride length Gait velocity: decreased Gait velocity interpretation: <1.8 ft/sec, indicate of risk for recurrent falls General Gait Details: Cues for upward gaze, pt with slow pace, several brief standing rest breaks. No gross instability noted   Stairs             Wheelchair Mobility    Modified Rankin (Stroke Patients Only)       Balance Overall balance assessment: Needs assistance Sitting-balance support: Feet supported Sitting balance-Leahy Scale: Good     Standing balance support: No upper extremity supported;During functional activity Standing balance-Leahy Scale: Fair Standing balance comment: Pt must have walker more for pain control than the need for it for balance only.                            Cognition Arousal/Alertness: Awake/alert Behavior During Therapy: Flat affect Overall Cognitive Status: Within Functional Limits for tasks assessed                                        Exercises      General Comments        Pertinent Vitals/Pain Pain Assessment: Faces Faces Pain Scale: Hurts little more Pain Location: ankles Pain Descriptors / Indicators: Tightness Pain Intervention(s): Monitored during session    Home Living                      Prior  Function            PT Goals (current goals can now be found in the care plan section) Acute Rehab PT Goals Patient Stated Goal: home, back to independent Potential to Achieve Goals: Good    Frequency    Min 3X/week      PT Plan Current plan remains appropriate    Co-evaluation              AM-PAC PT "6 Clicks" Mobility   Outcome Measure  Help needed turning from your back to your side while in a flat bed without using bedrails?: None Help needed moving from lying on your back to sitting on the side of a flat bed without using  bedrails?: None Help needed moving to and from a bed to a chair (including a wheelchair)?: None Help needed standing up from a chair using your arms (e.g., wheelchair or bedside chair)?: None Help needed to walk in hospital room?: None Help needed climbing 3-5 steps with a railing? : A Little 6 Click Score: 23    End of Session   Activity Tolerance: Patient tolerated treatment well Patient left: with call bell/phone within reach;in chair Nurse Communication: Mobility status PT Visit Diagnosis: Unsteadiness on feet (R26.81);Pain     Time: 1062-6948 PT Time Calculation (min) (ACUTE ONLY): 20 min  Charges:  $Therapeutic Activity: 8-22 mins                     Lillia Pauls, PT, DPT Acute Rehabilitation Services Pager [phone] Office [phone]    Norval Morton 06/02/2020, 3:13 PM

## 2020-06-02 NOTE — Progress Notes (Signed)
Initial Nutrition Assessment  DOCUMENTATION CODES:   Not applicable  INTERVENTION:   -MVI with minerals daily -Ensure Enlive po BID, each supplement provides 350 kcal and 20 grams of protein -Magic cup TID with meals, each supplement provides 290 kcal and 9 grams of protein  NUTRITION DIAGNOSIS:   Increased nutrient needs related to acute illness (endocarditis) as evidenced by estimated needs.  GOAL:   Patient will meet greater than or equal to 90% of their needs  MONITOR:   PO intake,Supplement acceptance,Labs,Weight trends,Skin,I & O's  REASON FOR ASSESSMENT:   LOS    ASSESSMENT:      Pt admitted with tricuspid valve MRSA endocarditis with septic emboli.   1/30- first HD treatment 2/1- second HD treatment 2/3- s/p TEE- revealed large tricuspid valve vegetation, moderate to severe TR, and possible tiny PFO 2/4- HD cath removed  Reviewed I/O's: -13 ml x 24 hours and -15.1 L since admission  UOP: 1.3 L x 24 hours  Pt unavailable at time of visit.   Pt with good appetite. Noted meal completion 50-100%.   Pt required short term HD, but line was removed secondary to renal recovery.   Per MD notes, pt to remain hospitalized for approximately 8 weeks to complete IV antibiotics.   Per CVTS notes, possible plan for angiovac.   Wt has been stable since admission.  Medications reviewed.   Labs reviewed.  Diet Order:   Diet Order            Diet renal/carb modified with fluid restriction Fluid restriction: 1200 mL Fluid; Room service appropriate? Yes; Fluid consistency: Thin  Diet effective now                 EDUCATION NEEDS:   No education needs have been identified at this time  Skin:  Skin Assessment: Skin Integrity Issues: Skin Integrity Issues:: Incisions Incisions: closed back  Last BM:  06/02/20  Height:   Ht Readings from Last 1 Encounters:  05/21/20 5\' 10"  (1.778 m)    Weight:   Wt Readings from Last 1 Encounters:  06/02/20 79.4 kg     Ideal Body Weight:  75.5 kg  BMI:  Body mass index is 25.12 kg/m.  Estimated Nutritional Needs:   Kcal:  2300-2500  Protein:  135-155 grams  Fluid:  > 2 L    08/21/2020, RD, LDN, CDCES Registered Dietitian II Certified Diabetes Care and Education Specialist Please refer to [facility] for RD and/or RD on-call/weekend/after hours pager

## 2020-06-02 NOTE — Progress Notes (Signed)
Pt refusing to stand for daily weight at this time due to back pain. Oxy IR and ice pack were given at 2am. Will re-attempt standing weight before 7am.

## 2020-06-02 NOTE — Progress Notes (Signed)
[facility]    Date of Admission:  05/21/2020   Total days of antibiotics 2           ID: Steven Cortez is a 45 y.o. male with   Principal Problem:   AKI (acute kidney injury) (HCC) Active Problems:   Endocarditis   Urinary retention   Hyperkalemia   Microcytic anemia   MRSA infection   Epidural abscess   Opioid use disorder    Subjective: Low back pain controlled Intermittent headache improved No rash/n/v  Await thoracic surgery evaluation  Medications:  . sodium chloride   Intravenous Once  . amLODipine  10 mg Oral Daily  . carvedilol  3.125 mg Oral BID WC  . Chlorhexidine Gluconate Cloth  6 each Topical Daily  . enoxaparin (LOVENOX) injection  40 mg Subcutaneous Daily    Objective: Vital signs in last 24 hours: Temp:  [97.7 F (36.5 C)-99.7 F (37.6 C)] 97.7 F (36.5 C) (02/08 0839) Pulse Rate:  [42-97] 96 (02/08 0839) Resp:  [18] 18 (02/08 0839) BP: (156-164)/(92-98) 164/96 (02/08 0839) SpO2:  [93 %-97 %] 97 % (02/08 0839) Weight:  [79.4 kg] 79.4 kg (02/08 0417)  Physical Exam  Constitutional: well developed, no distress, conversant; getting picc HEENT: atraumatic Lungs: normal respiratory effort  Lab Results Recent Labs    05/31/20 0417 06/01/20 0454 06/02/20 0341  WBC 10.3  --   --   HGB 7.7*  --   --   HCT 23.8*  --   --   NA 133* 135 135  K 4.8 4.7 4.9  CL 101 102 103  CO2 23 25 22   BUN 55* 52* 52*  CREATININE 1.52* 1.53* 1.45*   Liver Panel Recent Labs    06/01/20 0454 06/02/20 0341  ALBUMIN 1.7* 1.7*   Sedimentation Rate No results for input(s): ESRSEDRATE in the last 72 hours. C-Reactive Protein No results for input(s): CRP in the last 72 hours.  Microbiology: 1/29 surgical lumbar spine tissue cx mrsa (S tetracycline, bactrim) 1/27 bcx negative  Serology: 05/25/19 hep b core ab, surface Ab reactive; sAg negative 05/22/19 hep c rna pcr negative  Imaging: 2/03 tee 1. Large tricuspid valve  vegetation, 3.5 x 0.5 cm serpiginous vegetation.  Appears attached to atrial aspect of valve, most closely associated with  base of posterior tricupsid valve leaflet. The tricuspid valve is  abnormal. Tricuspid valve regurgitation  is moderate to severe, and very eccentric.  2. Agitated saline contrast bubble study was positive with shunting  observed within 3-6 cardiac cycles suggestive of interatrial shunt (1 to 2  bubbles seem to cross). There is a probable tiny patent foramen ovale.  3. Left ventricular ejection fraction, by estimation, is 55 to 60%. The  left ventricle has normal function.  4. Right ventricular systolic function is normal. The right ventricular  size is normal.  5. No left atrial/left atrial appendage thrombus was detected.  6. The mitral valve is normal in structure. Trivial mitral valve  regurgitation.  7. The aortic valve is tricuspid. Aortic valve regurgitation is not  visualized. No aortic stenosis is present.   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 Epidural abscess compresses the thecal sac from T11-12 to the level of S2.  1/27 cxr Personally reviewed Multiple ill-defined nodular opacities of varying sizes, several of which show cavitation, likely representing multifocal septic emboli. These nodular opacities overall appear similar to findings on most recent CT. There  is a small right pleural effusion with slight increase in airspace opacity in the right base, likely focus of pneumonia.  Stable cardiac silhouette.  Assessment/Plan: Lines: 2/08-c rue picc  1/28-2/04 right HD catheter   Abx: 1/27-c dapto  1/27-1/31 doxy  44yo M IVDU, prior hep c/b infection (not active), left ama from osh ([place] 10 days prior to admission; bcx mrsa) after dx endocarditis, admitted 1/27 for LE swelling/pain, decreased uop, and dyspnea. Course complicated by AKI   Imaging concerning for bilateral  pulmonary septic emboli with cavitary nodules. tte large tv vegetation of 3 cm. MRI t/l spine shows L4-5 and L5-S1 discitis/OM along with t11-s2 epidural abscess. He is s/p l4-5, l5-s1 laminectomy and evacuation of epidural abscess on 1/29  At this time doesn't appear to have much if any LE neurological deficit post surgery  Primary team had spoken with ct surgery regarding angiovac    Aki: c3 level low; normal c4. Suspect mostly due to sepsis atn. However, possibility of postinfectious GN (GN does occur much earlier in staph aureus related process). For infectious gn, treatment is against underlying cause. Needed brief HD here but no longer; hd catheter removed 2/04  2/03 Tee showed pfo along with large tv veg. Await ct surgery/IR angiovac/pfo occlusion   Thrombocytopenia improving; unclear etiology; possible some consumption from sepsis. Remains on heparin/lovenox prophy.   -continue daptomycin renal dosing per pharmacy; last cpk 14 on 2/07 -await CTS/IR angiovac/pfo occlusion next week -will need at least 8 weeks abx therapy; would start 1/29    [facility]   06/02/2020, 9:33 AM

## 2020-06-03 ENCOUNTER — Inpatient Hospital Stay (HOSPITAL_COMMUNITY): Payer: Medicaid Other

## 2020-06-03 DIAGNOSIS — R519 Headache, unspecified: Secondary | ICD-10-CM

## 2020-06-03 DIAGNOSIS — M545 Low back pain, unspecified: Secondary | ICD-10-CM

## 2020-06-03 LAB — CBC WITH DIFFERENTIAL/PLATELET
Abs Immature Granulocytes: 0.02 10*3/uL (ref 0.00–0.07)
Basophils Absolute: 0 10*3/uL (ref 0.0–0.1)
Basophils Relative: 0 %
Eosinophils Absolute: 0.1 10*3/uL (ref 0.0–0.5)
Eosinophils Relative: 2 %
HCT: 20.9 % — ABNORMAL LOW (ref 39.0–52.0)
Hemoglobin: 6.8 g/dL — CL (ref 13.0–17.0)
Immature Granulocytes: 0 %
Lymphocytes Relative: 13 %
Lymphs Abs: 0.8 10*3/uL (ref 0.7–4.0)
MCH: 28.5 pg (ref 26.0–34.0)
MCHC: 32.5 g/dL (ref 30.0–36.0)
MCV: 87.4 fL (ref 80.0–100.0)
Monocytes Absolute: 0.8 10*3/uL (ref 0.1–1.0)
Monocytes Relative: 13 %
Neutro Abs: 4.5 10*3/uL (ref 1.7–7.7)
Neutrophils Relative %: 72 %
Platelets: 130 10*3/uL — ABNORMAL LOW (ref 150–400)
RBC: 2.39 MIL/uL — ABNORMAL LOW (ref 4.22–5.81)
RDW: 17.2 % — ABNORMAL HIGH (ref 11.5–15.5)
WBC: 6.3 10*3/uL (ref 4.0–10.5)
nRBC: 0 % (ref 0.0–0.2)

## 2020-06-03 LAB — HEMOGLOBIN AND HEMATOCRIT, BLOOD
HCT: 22.2 % — ABNORMAL LOW (ref 39.0–52.0)
Hemoglobin: 7.4 g/dL — ABNORMAL LOW (ref 13.0–17.0)

## 2020-06-03 LAB — HEPATIC FUNCTION PANEL
ALT: 46 U/L — ABNORMAL HIGH (ref 0–44)
AST: 45 U/L — ABNORMAL HIGH (ref 15–41)
Albumin: 1.6 g/dL — ABNORMAL LOW (ref 3.5–5.0)
Alkaline Phosphatase: 279 U/L — ABNORMAL HIGH (ref 38–126)
Bilirubin, Direct: 0.1 mg/dL (ref 0.0–0.2)
Indirect Bilirubin: 0.5 mg/dL (ref 0.3–0.9)
Total Bilirubin: 0.6 mg/dL (ref 0.3–1.2)
Total Protein: 5.9 g/dL — ABNORMAL LOW (ref 6.5–8.1)

## 2020-06-03 LAB — RENAL FUNCTION PANEL
Albumin: 1.6 g/dL — ABNORMAL LOW (ref 3.5–5.0)
Anion gap: 9 (ref 5–15)
BUN: 54 mg/dL — ABNORMAL HIGH (ref 6–20)
CO2: 24 mmol/L (ref 22–32)
Calcium: 7.9 mg/dL — ABNORMAL LOW (ref 8.9–10.3)
Chloride: 101 mmol/L (ref 98–111)
Creatinine, Ser: 1.63 mg/dL — ABNORMAL HIGH (ref 0.61–1.24)
GFR, Estimated: 53 mL/min — ABNORMAL LOW (ref >60)
Glucose, Bld: 93 mg/dL (ref 70–99)
Phosphorus: 4.5 mg/dL (ref 2.5–4.6)
Potassium: 5.1 mmol/L (ref 3.5–5.1)
Sodium: 134 mmol/L — ABNORMAL LOW (ref 135–145)

## 2020-06-03 LAB — PREPARE RBC (CROSSMATCH)

## 2020-06-03 IMAGING — US US RENAL
1 series · 14 of 25 positions shown · non-contrast
Comparison: [DATE].

CLINICAL DATA: Hydronephrosis.  Acute renal failure.

EXAM:
RENAL / URINARY TRACT ULTRASOUND COMPLETE

[Series 1: us renal · 14 of 40 slices shown]
[im 1/40]
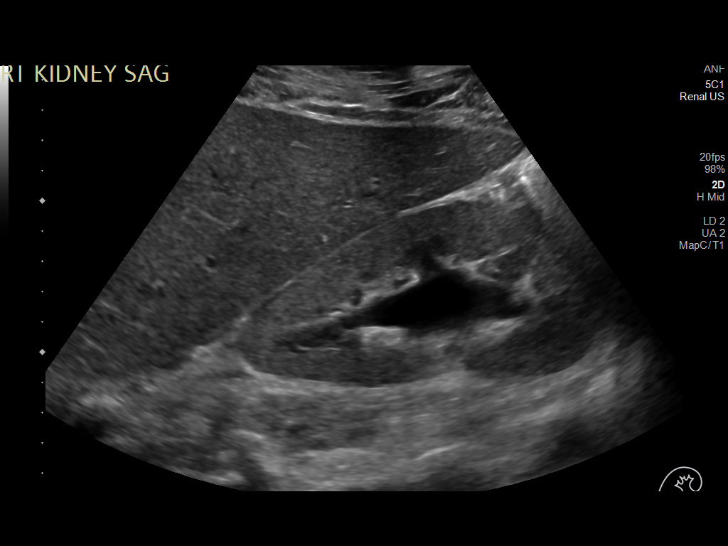
[im 4/40]
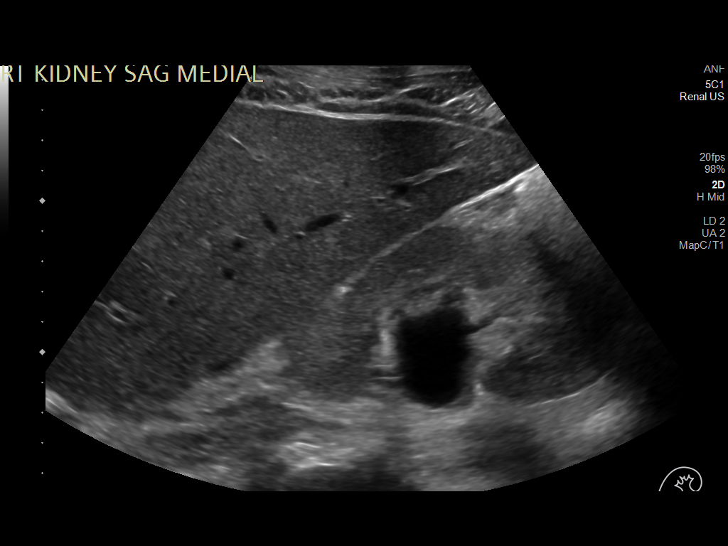
[im 7/40]
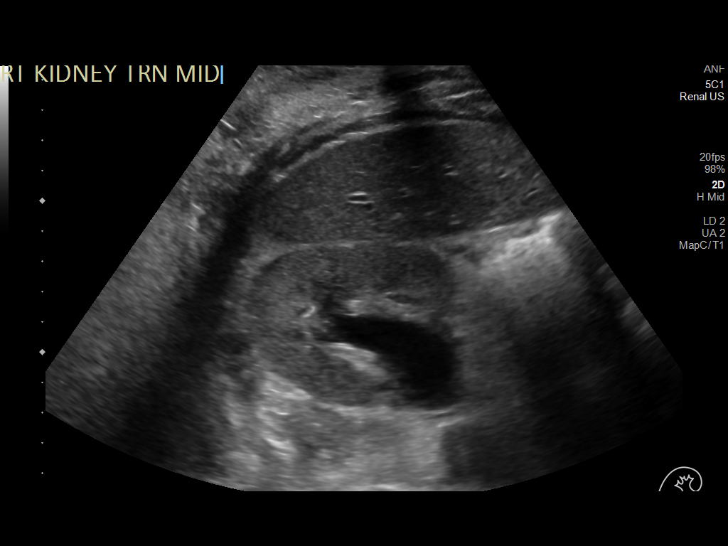
[im 10/40]
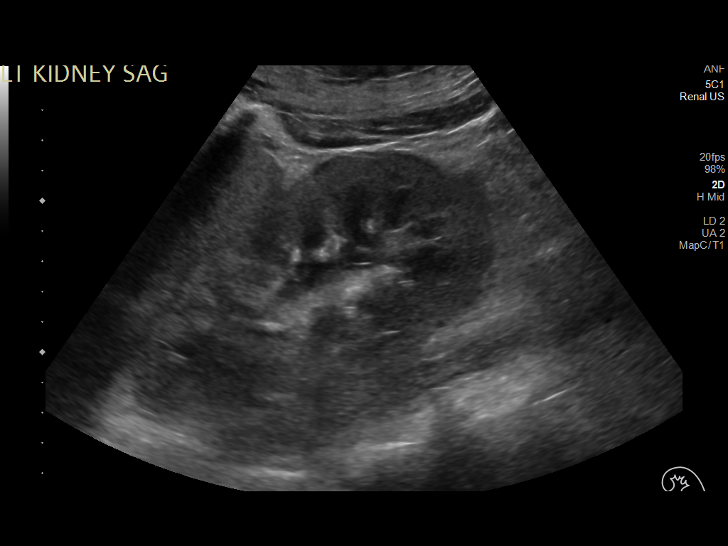
[im 14/40]
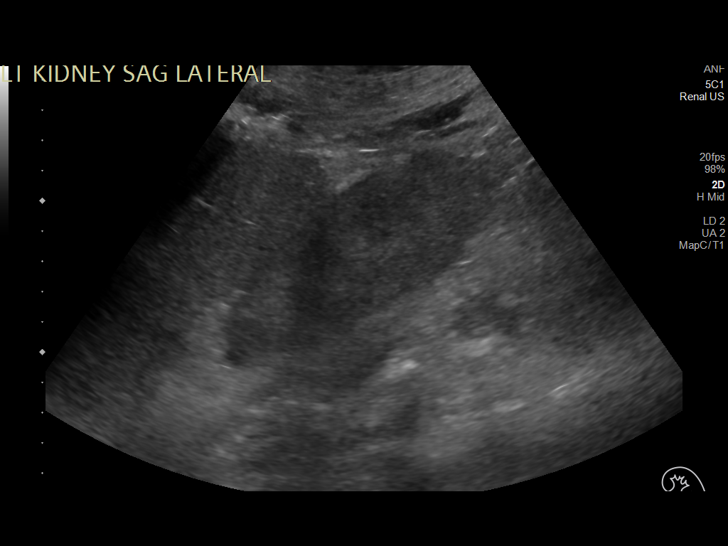
[im 15/40]
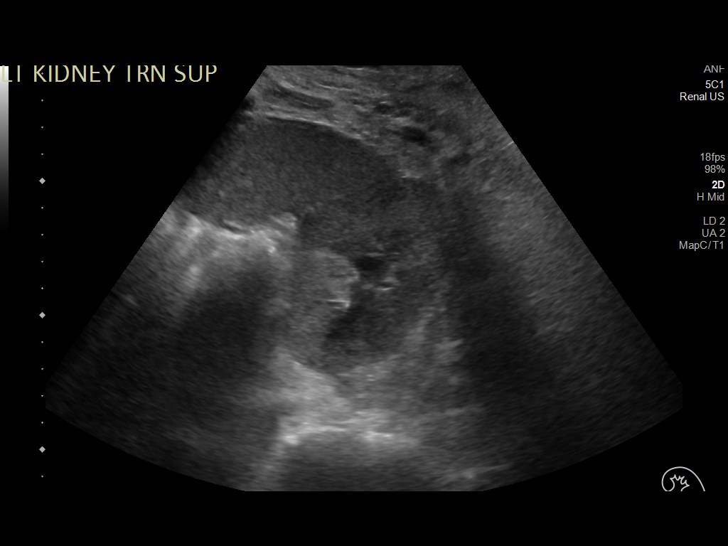
[im 18/40]
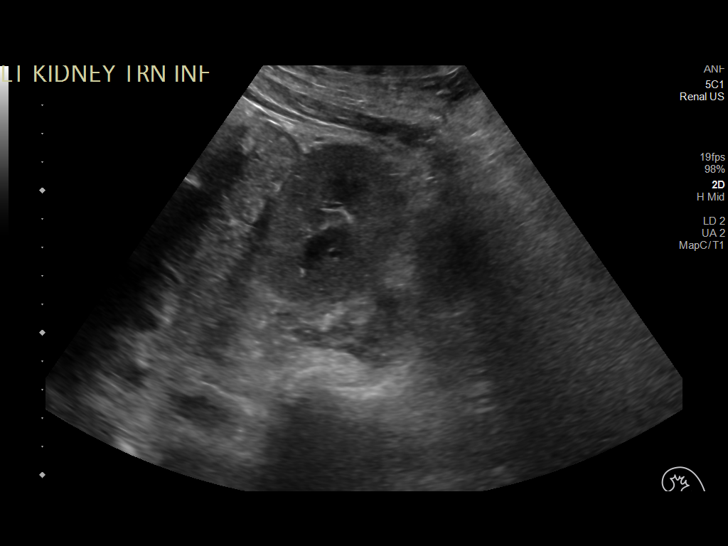
[im 22/40]
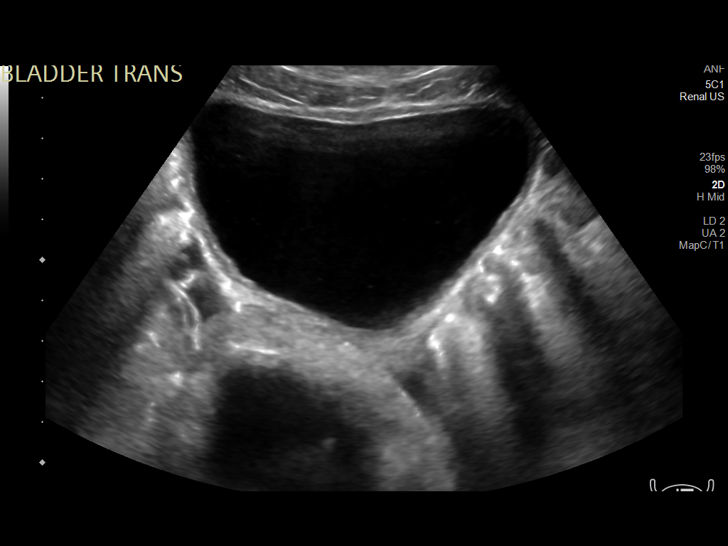
[im 25/40]
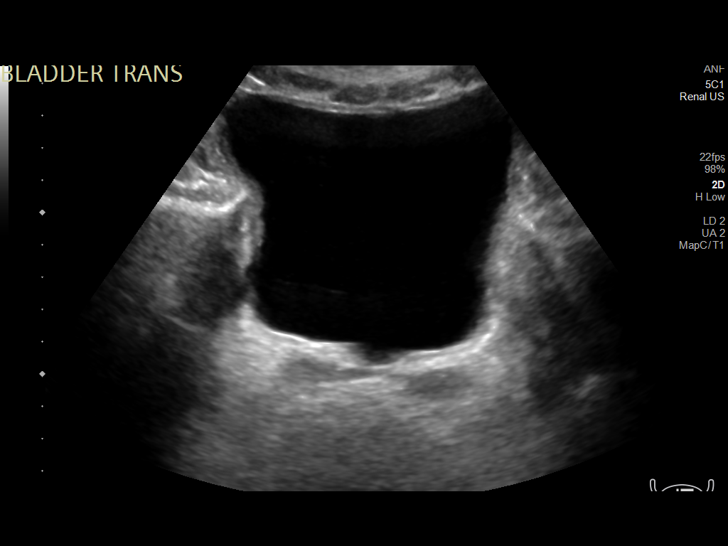
[im 27/40]
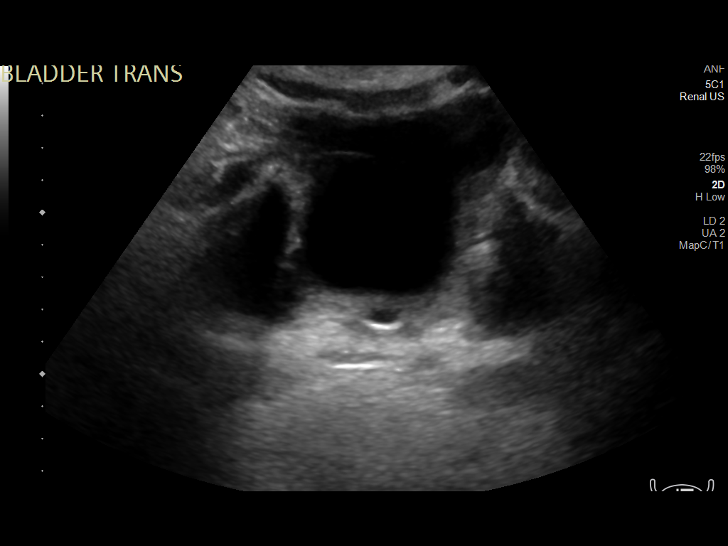
[im 30/40]
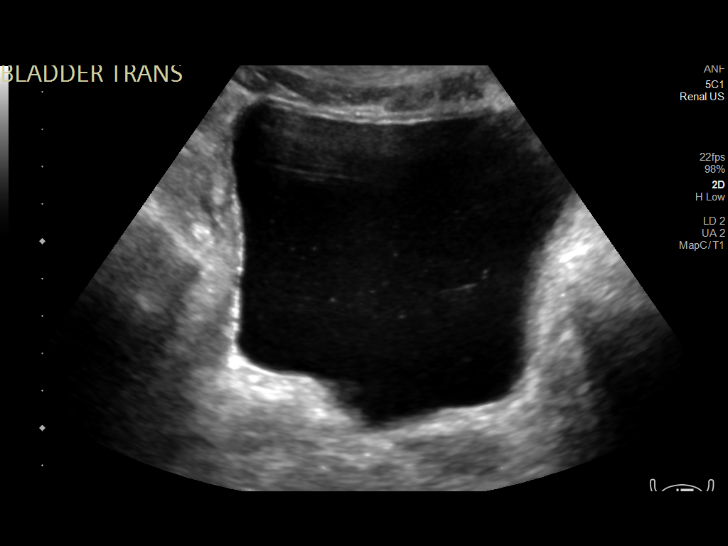
[im 33/40]
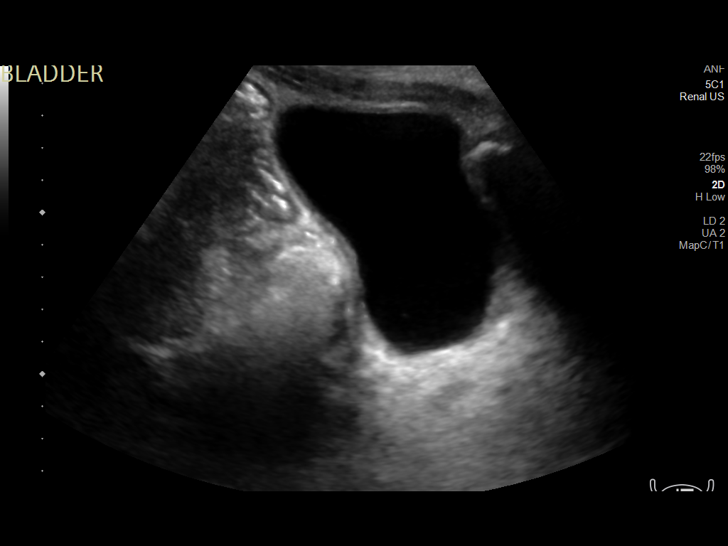
[im 36/40]
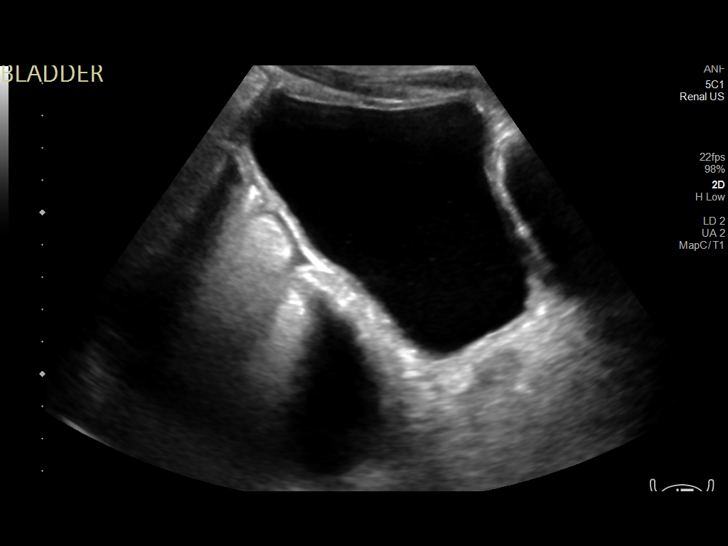
[im 40/40]
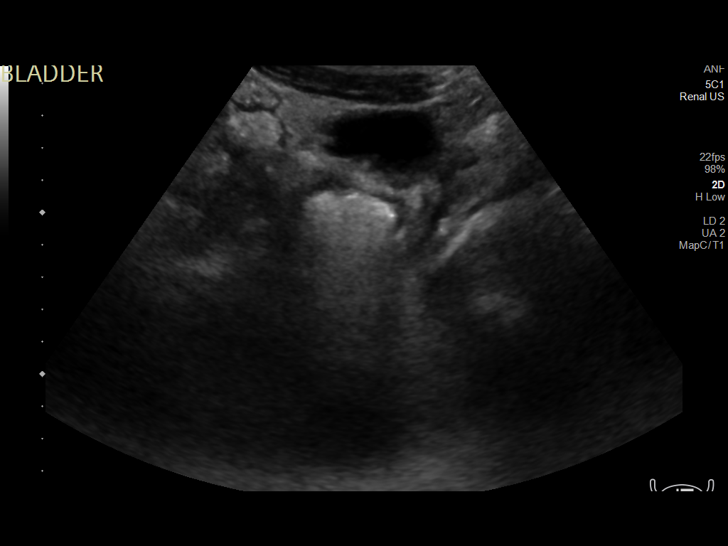

[14 of 25 positions shown; findings below may reference images not displayed]

FINDINGS: Right Kidney:

Renal measurements: 12.6 x 5.8 x 5.9 cm = volume: 226 mL. Increased
echogenicity of renal parenchyma is noted suggesting medical renal
disease. Mild hydronephrosis is noted. No definite mass is noted.

Left Kidney:

Renal measurements: 13.2 x 6.5 x 6.4 cm = volume: 288 mL. Increased
echogenicity of renal parenchyma is noted suggesting medical renal
disease. No definite mass or hydronephrosis is noted.

Bladder:

Appears normal for degree of bladder distention. Left ureteral jet
is visualized.

Other:

Minimal ascites is noted in the pelvis.
IMPRESSION: Increased echogenicity of renal parenchyma is noted bilaterally
suggesting medical renal disease.

Mild right hydronephrosis is noted.

Minimal pelvic ascites is noted.

## 2020-06-03 MED ORDER — SODIUM CHLORIDE 0.9% IV SOLUTION: Freq: Once | INTRAVENOUS | Status: DC

## 2020-06-03 NOTE — Progress Notes (Signed)
[facility]    Date of Admission:  05/21/2020   Total days of antibiotics 2           ID: Steven Cortez is a 45 y.o. male with   Principal Problem:   AKI (acute kidney injury) (HCC) Active Problems:   Endocarditis   Urinary retention   Hyperkalemia   Microcytic anemia   MRSA infection   Epidural abscess   Opioid use disorder    Subjective: Low back pain controlled Intermittent headache not worse; brain mri 2/08 no acute abnormality Reported worsening rle weakness; l-spine mri surgical bed fluid collection otherwise stable/improved epidural No rash/n/v  Await thoracic surgery evaluation  Medications:  . sodium chloride   Intravenous Once  . amLODipine  10 mg Oral Daily  . carvedilol  3.125 mg Oral BID WC  . Chlorhexidine Gluconate Cloth  6 each Topical Daily  . enoxaparin (LOVENOX) injection  40 mg Subcutaneous Daily  . feeding supplement  237 mL Oral BID BM  . multivitamin with minerals  1 tablet Oral Daily  . sodium chloride flush  10-40 mL Intracatheter Q12H    Objective: Vital signs in last 24 hours: Temp:  [98.6 F (37 C)-99.3 F (37.4 C)] 99.3 F (37.4 C) (02/09 1143) Pulse Rate:  [86-99] 89 (02/09 1143) Resp:  [18-20] 18 (02/09 1143) BP: (131-143)/(79-90) 132/84 (02/09 1143) SpO2:  [96 %-98 %] 96 % (02/09 1143) Weight:  [80.6 kg] 80.6 kg (02/09 0134)  Physical Exam   RUE picc site no erythema/purulence/tenderness  Constitutional: well developed, no distress, conversant HEENT: atraumatic; poor dentition cv rrr no mrg Lungs: normal respiratory effort abd s/nt Neuro: more effort to move RLE but strength appears to be 4-5/5 and stable from previous exam, strength LLE also 4-5/5 but appears to be slightly stronger; no clonus in bilateral LE Psych alert/oriented  Lab Results Recent Labs    06/02/20 0341 06/03/20 0414 06/03/20 1117  WBC  --   --  6.3  HGB  --   --  6.8*  HCT  --   --  20.9*  NA 135 134*  --   K 4.9  5.1  --   CL 103 101  --   CO2 22 24  --   BUN 52* 54*  --   CREATININE 1.45* 1.63*  --    Liver Panel Recent Labs    06/02/20 0341 06/03/20 0414  ALBUMIN 1.7* 1.6*   Sedimentation Rate No results for input(s): ESRSEDRATE in the last 72 hours. C-Reactive Protein No results for input(s): CRP in the last 72 hours.  Microbiology: 1/29 surgical lumbar spine tissue cx mrsa (S tetracycline, bactrim) 1/27 bcx negative  Serology: 05/25/19 hep b core ab, surface Ab reactive; sAg negative 05/22/19 hep c rna pcr negative  Imaging: 2/03 tee 1. Large tricuspid valve vegetation, 3.5 x 0.5 cm serpiginous vegetation.  Appears attached to atrial aspect of valve, most closely associated with  base of posterior tricupsid valve leaflet. The tricuspid valve is  abnormal. Tricuspid valve regurgitation  is moderate to severe, and very eccentric.  2. Agitated saline contrast bubble study was positive with shunting  observed within 3-6 cardiac cycles suggestive of interatrial shunt (1 to 2  bubbles seem to cross). There is a probable tiny patent foramen ovale.  3. Left ventricular ejection fraction, by estimation, is 55 to 60%. The  left ventricle has normal function.  4. Right ventricular systolic function is normal. The right ventricular  size is normal.  5. No left atrial/left atrial appendage thrombus was detected.  6. The mitral valve is normal in structure. Trivial mitral valve  regurgitation.  7. The aortic valve is tricuspid. Aortic valve regurgitation is not  visualized. No aortic stenosis is present.   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 Epidural abscess compresses the thecal sac from T11-12 to the level of S2.  1/27 cxr Personally reviewed Multiple ill-defined nodular opacities of varying sizes, several of which show cavitation, likely representing multifocal septic emboli. These nodular opacities overall appear similar  to findings on most recent CT. There is a small right pleural effusion with slight increase in airspace opacity in the right base, likely focus of pneumonia.  Stable cardiac silhouette.  Assessment/Plan: Lines: 2/08-c rue picc  1/28-2/04 right HD catheter   Abx: 1/27-c dapto  1/27-1/31 doxy  44yo M IVDU, prior hep c/b infection (not active), left ama from osh ([place] 10 days prior to admission; bcx mrsa) after dx endocarditis, admitted 1/27 for LE swelling/pain, decreased uop, and dyspnea. Course complicated by AKI   Imaging concerning for bilateral pulmonary septic emboli with cavitary nodules. tte large tv vegetation of 3 cm. MRI t/l spine shows L4-5 and L5-S1 discitis/OM along with t11-s2 epidural abscess. He is s/p l4-5, l5-s1 laminectomy and evacuation of epidural abscess on 1/29  At this time doesn't appear to have much if any LE neurological deficit post surgery  Primary team had spoken with ct surgery regarding angiovac    Aki: c3 level low; normal c4. Suspect mostly due to sepsis atn. However, possibility of postinfectious GN (GN does occur much earlier in staph aureus related process). For infectious gn, treatment is against underlying cause. Needed brief HD here but no longer; hd catheter removed 2/04  2/03 Tee showed pfo along with large tv veg. Await ct surgery/IR angiovac/pfo occlusion   Thrombocytopenia improving; unclear etiology; possible some consumption from sepsis. Remains on heparin/lovenox prophy.    --------- 2/09 assessment Patient complained of increased leg weakness right LE and intermittent headache. Brain mri 2/08 no acute abnormality. L-spine mri stable/improved epidural but mentioned laminectomy surgical bed fluid collection. I am unable to tell if he is weaker -- appear the same. NSG has been contacted to evaluate  -continue daptomycin renal dosing per pharmacy; last cpk 14 on 2/07 -await CTS/IR angiovac/pfo occlusion next week -f/u neurosurg  recs -plan least 8 weeks abx therapy; would start 1/29 -will need repeat mri spine in 4-5 weeks starting 06/02/2020 (around 2nd week march 2022)    [facility]   06/03/2020, 11:56 AM

## 2020-06-03 NOTE — Progress Notes (Addendum)
Subjective:  Patient reports feeling better, with improved pain. He notes sensation of numbness of his right ankle. Denies difficulty emptying bladder.   Objective:  Vital signs in last 24 hours: Vitals:   06/02/20 2003 06/03/20 0134 06/03/20 0454 06/03/20 1143  BP: (!) 143/86 140/90 137/79 132/84  Pulse: 98 91 99 89  Resp: 20 20 18 18   Temp: 98.7 F (37.1 C) 99 F (37.2 C) 98.6 F (37 C) 99.3 F (37.4 C)  TempSrc: Oral Oral Oral Oral  SpO2: 97% 97% 98% 96%  Weight:  80.6 kg    Height:       Weight change: 1.2 kg  Intake/Output Summary (Last 24 hours) at 06/03/2020 1534 Last data filed at 06/03/2020 1521 Gross per 24 hour  Intake 315 ml  Output 1650 ml  Net -1335 ml   Physical Exam Constitutional:      General: He is not in acute distress.    Appearance: He is not ill-appearing, toxic-appearing or diaphoretic.     Comments: Patient seen lying comfortably in bed. NAD.  Pulmonary:     Effort: Pulmonary effort is normal.  Musculoskeletal:     Right lower leg: No edema.     Left lower leg: No edema.     Comments: Moves extremities spontaneously  Neurological:     Mental Status: He is alert.      Assessment/Plan:   Principal Problem:   AKI (acute kidney injury) (HCC) Active Problems:   Endocarditis   Urinary retention   Hyperkalemia   Microcytic anemia   MRSA infection   Epidural abscess   Opioid use disorder  Steven Houchinsis a 45 y.o.malewith hx of IV drug use and untreated Hep Cwho was diagnosed with endocarditis and MRSA bacteremia at [facility], left AMA then presented to [facility] a few days later. Also found to have large epidural abscess,s/p laminectomy and evacuation by neurosurgery on1/29. Symptoms and leukocytosis are improving with antibiotics.   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 2/29 2/4 for possible angiovac however is high  risk due to PFO. Would need a balloon occlusion of the PFO if he were to do the angiovac (would be multidisciplinary with Interventional Cardiology assistance).  -blood cultures negative to date -Repeat Lumbar MRI today showed persistent osteomyelitis and additional paraspinous collections along the anterior margin at levels L4-5 and L5-S1, now more organized as compared to previous. Additionally showed potentially increased significant right-sided hydronephrosis. F/u Renal Clif[place]on Asters congruent. Bladder scan of 0, continues with appropriate UOP -Patient endorsing improved back pain and hip pain on increased pain medication. Some numbness to low  Plan -consulted neurosurgery, f/u recs -continue daptomycin to complete 8w treatment. -last CK normal on 2/7 -pain management: Oxycodone15mg  q4h, PRN dilaudid 0.5mg  daily 20-30 mins before PT/OT sessions -Appreciate ID rec: will need repeat mri spine in 4-5 weeks starting 06/02/2020 (around 2nd week march 2022) -PRN Albuterol      Occipital Headache; resolved Patient reporting new-onset, constant posterior headache starting 1-2 days ago. Neurologically appropriate on exam. No vision changes or neck pain, but further workup required in context of his tricuspid endocarditis with potential for embolic manifestations.CT head w/o contrast normal.Patient reporting headache has resolved. MRI brain without contrast unremarkable.    Acute renal failure 2/2 endocarditisnow off HD. HD line removed 2/4.Continues to have good urine output. Will continue to monitor with strict I/Os.   Anemia of chronic disease, Anemia of critical illness.  Some drop in Hgb to 6.8 today.  -f/u post transfusion H&H -continue to monitor   Hypertension.Appropriately controlled with improved pain control. On Amlodipine 10 and Coreg continue to monitor   Opioid use disorder He has been on methadone in the past but would be interested in treatment with suboxone. Will readdress  this prior to discharge. Continue oxycodone prn for now.     LOS: 13 days   [name], Medical Student 06/03/2020, 3:34 PM

## 2020-06-03 NOTE — Progress Notes (Signed)
Critical lab value- hemoglobin of 6.8; MD paged. Will continue to monitor.

## 2020-06-03 NOTE — Progress Notes (Signed)
MRI reviewed, shows overall stable findings, post-op changes from laminotomies. The new fluid collections are post-op in nature, likely a combination of seroma and muscle edema, and are not concerning for new areas of abscess. He still has fairly significant canal stenosis caudally. When I evacuated his abscess last time, I suctioned out pus for a few minutes straight before it stopped coming into the field. So, from an infection control standpoint, I think that a repeat of that will produce the same results with recurrence like this. I therefore do not think that repeat decompression is needed or going to significantly alter the disease process. Since he is clinically doing well at this point, will follow with serial neurologic exams and see his trajectory.

## 2020-06-04 DIAGNOSIS — N1330 Unspecified hydronephrosis: Secondary | ICD-10-CM

## 2020-06-04 DIAGNOSIS — F1120 Opioid dependence, uncomplicated: Secondary | ICD-10-CM

## 2020-06-04 DIAGNOSIS — D649 Anemia, unspecified: Secondary | ICD-10-CM

## 2020-06-04 DIAGNOSIS — M25559 Pain in unspecified hip: Secondary | ICD-10-CM

## 2020-06-04 DIAGNOSIS — E440 Moderate protein-calorie malnutrition: Secondary | ICD-10-CM | POA: Diagnosis present

## 2020-06-04 DIAGNOSIS — M549 Dorsalgia, unspecified: Secondary | ICD-10-CM

## 2020-06-04 LAB — RENAL FUNCTION PANEL
Albumin: 1.6 g/dL — ABNORMAL LOW (ref 3.5–5.0)
Anion gap: 7 (ref 5–15)
BUN: 59 mg/dL — ABNORMAL HIGH (ref 6–20)
CO2: 25 mmol/L (ref 22–32)
Calcium: 7.8 mg/dL — ABNORMAL LOW (ref 8.9–10.3)
Chloride: 103 mmol/L (ref 98–111)
Creatinine, Ser: 1.82 mg/dL — ABNORMAL HIGH (ref 0.61–1.24)
GFR, Estimated: 46 mL/min — ABNORMAL LOW (ref >60)
Glucose, Bld: 92 mg/dL (ref 70–99)
Phosphorus: 4.8 mg/dL — ABNORMAL HIGH (ref 2.5–4.6)
Potassium: 5.3 mmol/L — ABNORMAL HIGH (ref 3.5–5.1)
Sodium: 135 mmol/L (ref 135–145)

## 2020-06-04 LAB — BPAM RBC
Blood Product Expiration Date: 202203112359
ISSUE DATE / TIME: 202202091509
Unit Type and Rh: 5100

## 2020-06-04 LAB — TYPE AND SCREEN
ABO/RH(D): O POS
Antibody Screen: NEGATIVE
Unit division: 0

## 2020-06-04 LAB — CBC
HCT: 24.8 % — ABNORMAL LOW (ref 39.0–52.0)
Hemoglobin: 7.9 g/dL — ABNORMAL LOW (ref 13.0–17.0)
MCH: 28.3 pg (ref 26.0–34.0)
MCHC: 31.9 g/dL (ref 30.0–36.0)
MCV: 88.9 fL (ref 80.0–100.0)
Platelets: 153 10*3/uL (ref 150–400)
RBC: 2.79 MIL/uL — ABNORMAL LOW (ref 4.22–5.81)
RDW: 16.6 % — ABNORMAL HIGH (ref 11.5–15.5)
WBC: 7.8 10*3/uL (ref 4.0–10.5)
nRBC: 0 % (ref 0.0–0.2)

## 2020-06-04 MED ORDER — HYDROMORPHONE HCL 1 MG/ML IJ SOLN
0.5000 mg | INTRAMUSCULAR | Status: DC | PRN
  Administered 2020-06-05 – 2020-06-07 (×6): 0.5 mg via INTRAVENOUS
  Filled 2020-06-04 (×8): qty 0.5

## 2020-06-04 MED ORDER — HYDROMORPHONE HCL 1 MG/ML IJ SOLN
0.5000 mg | Freq: Once | INTRAMUSCULAR | Status: AC
  Administered 2020-06-04: 0.5 mg via INTRAVENOUS
  Filled 2020-06-04: qty 0.5

## 2020-06-04 MED ORDER — HYDROMORPHONE HCL 1 MG/ML IJ SOLN: 0.5000 mg | INTRAMUSCULAR | Status: DC | PRN

## 2020-06-04 MED ORDER — SODIUM ZIRCONIUM CYCLOSILICATE 5 G PO PACK
5.0000 g | PACK | Freq: Once | ORAL | Status: AC
  Administered 2020-06-04: 5 g via ORAL
  Filled 2020-06-04: qty 1

## 2020-06-04 NOTE — Progress Notes (Signed)
[facility]    Date of Admission:  05/21/2020   Total days of antibiotics 2           ID: Steven Cortez is a 45 y.o. male with   Principal Problem:   AKI (acute kidney injury) (HCC) Active Problems:   Endocarditis   Urinary retention   Hyperkalemia   Microcytic anemia   MRSA infection   Epidural abscess   Opioid use disorder    Subjective: afebrile No rash/n/v   Medications:  . amLODipine  10 mg Oral Daily  . carvedilol  3.125 mg Oral BID WC  . Chlorhexidine Gluconate Cloth  6 each Topical Daily  . enoxaparin (LOVENOX) injection  40 mg Subcutaneous Daily  . feeding supplement  237 mL Oral BID BM  . multivitamin with minerals  1 tablet Oral Daily  . sodium chloride flush  10-40 mL Intracatheter Q12H    Objective: Vital signs in last 24 hours: Temp:  [98.5 F (36.9 C)-99.3 F (37.4 C)] 98.5 F (36.9 C) (02/10 1113) Pulse Rate:  [89-101] 92 (02/10 1113) Resp:  [16-22] 16 (02/10 1113) BP: (131-146)/(75-89) 135/85 (02/10 1113) SpO2:  [96 %-100 %] 100 % (02/10 1113) Weight:  [82.1 kg-82.3 kg] 82.1 kg (02/10 1043)  Physical Exam   RUE picc site no erythema/purulence/tenderness  Constitutional: well developed, no distress, conversant HEENT: atraumatic; poor dentition cv rrr no mrg Lungs: normal respiratory effort abd s/nt Neuro: more effort to move RLE but strength appears to be 4-5/5 and stable from previous exam, strength LLE also 4-5/5 but appears to be slightly stronger; no clonus in bilateral LE Psych alert/oriented  Lab Results Recent Labs    06/03/20 0414 06/03/20 1117 06/03/20 1117 06/03/20 1946 06/04/20 0432 06/04/20 0433  WBC  --  6.3  --   --   --  7.8  HGB  --  6.8*   < > 7.4*  --  7.9*  HCT  --  20.9*   < > 22.2*  --  24.8*  NA 134*  --   --   --  135  --   K 5.1  --   --   --  5.3*  --   CL 101  --   --   --  103  --   CO2 24  --   --   --  25  --   BUN 54*  --   --   --  59*  --   CREATININE 1.63*  --    --   --  1.82*  --    < > = values in this interval not displayed.   Liver Panel Recent Labs    06/03/20 1117 06/04/20 0432  PROT 5.9*  --   ALBUMIN 1.6* 1.6*  AST 45*  --   ALT 46*  --   ALKPHOS 279*  --   BILITOT 0.6  --   BILIDIR 0.1  --   IBILI 0.5  --    Sedimentation Rate No results for input(s): ESRSEDRATE in the last 72 hours. C-Reactive Protein No results for input(s): CRP in the last 72 hours.  Microbiology: 1/29 surgical lumbar spine tissue cx mrsa (S tetracycline, bactrim) 1/27 bcx negative  Serology: 05/25/19 hep b core ab, surface Ab reactive; sAg negative 05/22/19 hep c rna pcr negative  Imaging: 2/08 L-spine mri 1. Persistent findings of osteomyelitis discitis and septic arthritis at L4-5 and L5-S1. 2. Interval decompressive laminectomies on the  left at L4-5 and L5-S1. Persistent circumferential epidural collection at these levels with resultant moderate to severe spinal stenosis, similar to previous. Elsewhere, the epidural collection is overall improved. 3. Additional postoperative collections at the laminectomy sites as above, which could be postoperative and/or infectious in nature. 4. Additional paraspinous collections along the anterior margin of the spinal column at the levels of L4-5 and L5-S1, now more organized as compared to previous. 5. Moderate right-sided hydronephrosis, potentially increased from prior ultrasound. Further evaluation with dedicated renal ultrasound suggested for direct comparison purposes.  2/03 tee 1. Large tricuspid valve vegetation, 3.5 x 0.5 cm serpiginous vegetation.  Appears attached to atrial aspect of valve, most closely associated with  base of posterior tricupsid valve leaflet. The tricuspid valve is  abnormal. Tricuspid valve regurgitation  is moderate to severe, and very eccentric.  2. Agitated saline contrast bubble study was positive with shunting  observed within 3-6 cardiac cycles suggestive of  interatrial shunt (1 to 2  bubbles seem to cross). There is a probable tiny patent foramen ovale.  3. Left ventricular ejection fraction, by estimation, is 55 to 60%. The  left ventricle has normal function.  4. Right ventricular systolic function is normal. The right ventricular  size is normal.  5. No left atrial/left atrial appendage thrombus was detected.  6. The mitral valve is normal in structure. Trivial mitral valve  regurgitation.  7. The aortic valve is tricuspid. Aortic valve regurgitation is not  visualized. No aortic stenosis is present.   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 Epidural abscess compresses the thecal sac from T11-12 to the level of S2.  1/27 cxr Personally reviewed Multiple ill-defined nodular opacities of varying sizes, several of which show cavitation, likely representing multifocal septic emboli. These nodular opacities overall appear similar to findings on most recent CT. There is a small right pleural effusion with slight increase in airspace opacity in the right base, likely focus of pneumonia.  Stable cardiac silhouette.    Assessment/Plan: Lines: 2/08-c rue picc  1/28-2/04 right HD catheter   Abx: 1/27-c dapto  1/27-1/31 doxy  44yo M IVDU, prior hep c/b infection (not active), left ama from osh ([place] [facility] 10 days prior to admission; bcx mrsa) after dx endocarditis, admitted 1/27 for LE swelling/pain, decreased uop, and dyspnea. Course complicated by AKI   Imaging concerning for bilateral pulmonary septic emboli with cavitary nodules. tte large tv vegetation of 3 cm. MRI t/l spine shows L4-5 and L5-S1 discitis/OM along with t11-s2 epidural abscess. He is s/p l4-5, l5-s1 laminectomy and evacuation of epidural abscess on 1/29  At this time doesn't appear to have much if any LE neurological deficit post surgery  Primary team had spoken with ct surgery regarding angiovac    Aki: c3  level low; normal c4. Suspect mostly due to sepsis atn. However, possibility of postinfectious GN (GN does occur much earlier in staph aureus related process). For infectious gn, treatment is against underlying cause. Needed brief HD here but no longer; hd catheter removed 2/04  2/03 Tee showed pfo along with large tv veg. Await ct surgery/IR angiovac/pfo occlusion   Thrombocytopenia improving; unclear etiology; possible some consumption from sepsis. Remains on heparin/lovenox prophy.   2/08 mri L-spine for increased RLE weakness showed new surgical bed fluid collection (improved epidural process) --------- 2/10 assessment Reviewed NSG plan, potentially going to OR for more evacuation of the fluid collection   -continue daptomycin renal dosing per pharmacy; last cpk  14 on 2/07 -await CTS/IR angiovac/pfo occlusion next week -f/u neurosurg recs -plan least 8 weeks abx therapy; if he ends up with more surgery of the L-spine area, would start the clock for the 8 weeks on that date -will need repeat mri spine in 4-5 weeks starting 06/02/2020 (around 2nd week march 2022)    [facility]   06/04/2020, 11:29 AM

## 2020-06-04 NOTE — Progress Notes (Signed)
Occupational Therapy Treatment Patient Details Name: Steven Cortez MRN: [id number] DOB: 06-30-1975 Today's Date: 06/04/2020    History of present illness Pt is a 45 year old male with PMHx including Hep C and IVDU who presented with back pain, weakness, dyspnea, pleuritic chest pain, urinary retention, and pitting edema. He was recently admitted to [facility] and found to have tricuspid endocarditis with MRSA bacteremia, and pulmonary septic emboli but left AMA. 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 s/pTEE 05/28/20.  Pt with abscess in heart and hole in heart with possible surgery scheduled for week of Feb 14.   OT comments  Pt progressing towards acute OT goals. Premedicated with IV pain meds prior to session per pt request. Pt endorses increased back pain in the past couple of days. Completed household distance functional mobility utilizing rw for pain relief>balance. D/c plan remains appropriate.    Follow Up Recommendations  No OT follow up;Supervision - Intermittent    Equipment Recommendations  3 in 1 bedside commode    Recommendations for Other Services      Precautions / Restrictions Precautions Precautions: Fall Precaution Comments: utilized back precautions for comfort Restrictions Weight Bearing Restrictions: No       Mobility Bed Mobility Overal bed mobility: Modified Independent Bed Mobility: Rolling;Sidelying to Sit Rolling: Supervision Sidelying to sit: Supervision;HOB elevated          Transfers Overall transfer level: Needs assistance Equipment used: Rolling walker (2 wheeled) Transfers: Sit to/from Stand Sit to Stand: Supervision         General transfer comment: for safety. to/from EOB, elevated seat height    Balance Overall balance assessment: Needs assistance Sitting-balance support: Feet  supported Sitting balance-Leahy Scale: Good Sitting balance - Comments: BUE support as position of comfort due to pain.   Standing balance support: No upper extremity supported;During functional activity Standing balance-Leahy Scale: Fair Standing balance comment: rw for pain control>balance                           ADL either performed or assessed with clinical judgement   ADL Overall ADL's : Needs assistance/impaired                         Toilet Transfer: Min guard;RW;Grab bars           Functional mobility during ADLs: Min guard;Rolling walker General ADL Comments: Pt completed bed mobility, household distance functional mobility after being premedicated with IV pain med. Extra time and effort. Min guard for safety.     Vision       Perception     Praxis      Cognition Arousal/Alertness: Awake/alert Behavior During Therapy: WFL for tasks assessed/performed Overall Cognitive Status: Within Functional Limits for tasks assessed                                          Exercises     Shoulder Instructions       General Comments      Pertinent Vitals/ Pain       Pain Assessment: Faces Faces Pain Scale: Hurts even more Pain Location: back, ankles Pain Descriptors / Indicators: Guarding;Grimacing;Sore Pain Intervention(s): Premedicated before session;Repositioned;Monitored during session;Limited activity within  patient's tolerance  Home Living                                          Prior Functioning/Environment              Frequency  Min 2X/week        Progress Toward Goals  OT Goals(current goals can now be found in the care plan section)  Progress towards OT goals: Progressing toward goals  Acute Rehab OT Goals Patient Stated Goal: home, back to independent OT Goal Formulation: With patient Time For Goal Achievement: 06/07/20 Potential to Achieve Goals: Good ADL Goals Pt Will  Perform Grooming: with modified independence;standing Pt Will Perform Lower Body Bathing: with modified independence;sit to/from stand;with adaptive equipment Pt Will Perform Upper Body Dressing: with modified independence;sitting Pt Will Perform Lower Body Dressing: with modified independence;sit to/from stand;with adaptive equipment Pt Will Transfer to Toilet: with modified independence;ambulating Pt Will Perform Toileting - Clothing Manipulation and hygiene: with modified independence;sit to/from stand Additional ADL Goal #1: Pt will perform bed mobility at mod independent level as precursor to EOB/OOB ADL.  Plan Discharge plan remains appropriate    Co-evaluation                 AM-PAC OT "6 Clicks" Daily Activity     Outcome Measure   Help from another person eating meals?: None Help from another person taking care of personal grooming?: None Help from another person toileting, which includes using toliet, bedpan, or urinal?: A Little Help from another person bathing (including washing, rinsing, drying)?: A Little Help from another person to put on and taking off regular upper body clothing?: None Help from another person to put on and taking off regular lower body clothing?: A Little 6 Click Score: 21    End of Session Equipment Utilized During Treatment: Rolling walker  OT Visit Diagnosis: Other abnormalities of gait and mobility (R26.89);Muscle weakness (generalized) (M62.81);Pain   Activity Tolerance Patient limited by pain   Patient Left in bed;with call bell/phone within reach;Other (comment) (sitting EOB)   Nurse Communication Patient requests pain meds        Time: 7591-6384 OT Time Calculation (min): 30 min  Charges: OT General Charges $OT Visit: 1 Visit OT Treatments $Self Care/Home Management : 23-37 mins  Steven Cortez, OT Acute Rehabilitation Services Pager: [phone] Office: [phone]    Pilar Grammes 06/04/2020, 12:44  PM

## 2020-06-04 NOTE — Progress Notes (Addendum)
Nutrition Follow-up  DOCUMENTATION CODES:   Non-severe (moderate) malnutrition in context of chronic illness  INTERVENTION:   -Liberalize diet to regular per discussion with MD -Continue MVI with minerals daily -Continue Ensure Enlive po BID, each supplement provides 350 kcal and 20 grams of protein -Continue Magic cup TID with meals, each supplement provides 290 kcal and 9 grams of protein  NUTRITION DIAGNOSIS:   Moderate Malnutrition related to chronic illness (IV drug abuse) as evidenced by energy intake < or equal to 75% for > or equal to 1 month,mild muscle depletion,mild fat depletion.  Ongoing  GOAL:   Patient will meet greater than or equal to 90% of their needs  Progressing   MONITOR:   PO intake,Supplement acceptance,Labs,Weight trends,Skin,I & O's  REASON FOR ASSESSMENT:   LOS    ASSESSMENT:   Steven Cortez is a 45 y.o. male with hx of IV drug use and untreated Hep C who was diagnosed with endocarditis and MRSA bacteremia at [facility], left AMA then presented to [facility] a few days later. Also found to have large epidural abscess, s/p laminectomy and evacuation by neurosurgery on 1/29. Symptoms and leukocytosis are improving with antibiotics.  1/30- first HD treatment 2/1- second HD treatment 2/3- s/p TEE- revealed large tricuspid valve vegetation, moderate to severe TR, and possible tiny PFO 2/4- HD cath removed  Reviewed I/O's: -233 ml x 24 hours and -16.9 L since admission  UOP: 1.3 L x 24 hours  Spoke with pt at bedside, who reports feeling better today. He shares that his appetite has improved the past few days and that he ate 100% of breakfast this morning. Noted meal completion 25-100%. He expresses frustration over restrictions of renal diet.  Per pt, his appetite was decreased for about 6 weeks PTA and he was often only able to consume bites and sips of meals due to early satiety.   Pt shares his UBW is around 180#. He estimates that  he has lost 30-40# over the past 7-8 months. He started getting concerned about weight loss, because he made no changes to his eating habits PTA before the 6 weeks that he had a poor appetite. When healthy and well, pt consumed 2 meals (meat, starch, and vegetable) and a snack (chips, milk, and cookies) daily.   Discussed with pt importance of good meal and supplement intake to promote healing. Pt is enjoying Ensure supplements. He shares with RD plan for potential surgery tomorrow.   Case discussed with MD; received permission to liberalize diet.  Labs reviewed: K: 5.3.   NUTRITION - FOCUSED PHYSICAL EXAM:  Flowsheet Row Most Recent Value  Orbital Region Mild depletion  Upper Arm Region Mild depletion  Thoracic and Lumbar Region No depletion  Buccal Region Mild depletion  Temple Region Mild depletion  Clavicle Bone Region Mild depletion  Clavicle and Acromion Bone Region Mild depletion  Scapular Bone Region Mild depletion  Dorsal Hand Mild depletion  Patellar Region No depletion  Anterior Thigh Region No depletion  Posterior Calf Region No depletion  Edema (RD Assessment) Mild  Hair Reviewed  Eyes Reviewed  Mouth Reviewed  Skin Reviewed  Nails Reviewed       Diet Order:   Diet Order            Diet regular Room service appropriate? Yes; Fluid consistency: Thin  Diet effective now                 EDUCATION NEEDS:   Education needs have  been addressed  Skin:  Skin Assessment: Skin Integrity Issues: Skin Integrity Issues:: Incisions Incisions: closed back  Last BM:  06/03/20  Height:   Ht Readings from Last 1 Encounters:  05/21/20 5\' 10"  (1.778 m)    Weight:   Wt Readings from Last 1 Encounters:  06/04/20 82.1 kg    Ideal Body Weight:  75.5 kg  BMI:  Body mass index is 25.97 kg/m.  Estimated Nutritional Needs:   Kcal:  2300-2500  Protein:  135-155 grams  Fluid:  > 2 L    08/02/20, RD, LDN, CDCES Registered Dietitian II Certified Diabetes  Care and Education Specialist Please refer to [facility] for RD and/or RD on-call/weekend/after hours pager

## 2020-06-04 NOTE — Progress Notes (Signed)
Subjective:  Patient reporting worsening to back and hip pain. Endorses continued sensation of new-onset numbness at right ankle; nowhere else. Patient desires to move, but ambulation distance continues to be limited by pain. He had been making progress with ambulation initially following the evacuation on 1/29, but feels like is reversing. Some initial improvement to pain with increased oxycodone dose prior. Denies neck pain or difficulty moving arms. No headache. Reports has had no issues with emptying bladder, or with bowel movements    Objective:  Vital signs in last 24 hours: Vitals:   06/03/20 2019 06/04/20 0021 06/04/20 0035 06/04/20 0618  BP: (!) 146/89  137/83 133/87  Pulse: 98  100 92  Resp: (!) 22  18 18   Temp: 99 F (37.2 C)  99.2 F (37.3 C) 99 F (37.2 C)  TempSrc: Oral  Oral Oral  SpO2: 96%  97% 97%  Weight:  82.3 kg    Height:       Weight change: 1.7 kg  Intake/Output Summary (Last 24 hours) at 06/04/2020 1022 Last data filed at 06/04/2020 0853 Gross per 24 hour  Intake 1427.5 ml  Output 1300 ml  Net 127.5 ml   Physical Exam Constitutional:      General: He is not in acute distress.    Appearance: He is not toxic-appearing or diaphoretic.     Comments: Patient seen lying in bed. NAD.  Pulmonary:     Effort: Pulmonary effort is normal.  Musculoskeletal:     Right lower leg: 1+ Edema present.     Left lower leg: 1+ Edema present.     Comments: +1 pitting edema to ankle. Patient moves upper extremities spontaneously without pain. Grimaces in pain with movement of legs. Reduced strength in lower extremities  Neurological:     Mental Status: He is alert.     Assessment/Plan: Occipital Headache; resolved  Principal Problem:   AKI (acute kidney injury) (HCC) Active Problems:   Endocarditis   Urinary retention   Hyperkalemia   Microcytic anemia   MRSA infection   Epidural abscess   Opioid use disorder  Steven Houchinsis a 45 y.o.malewith hx of  IV drug use and untreated Hep Cwho was diagnosed with endocarditis and MRSA bacteremia at [facility], left AMA then presented to [facility] a few days later. Also found to have large epidural abscess,s/p laminectomy and evacuation by neurosurgery on1/29. Initially improving, now with progressive back pain and LE weakness.    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 2/29 2/4 for possible angiovac however is high risk due to PFO. Would need a balloon occlusion of the PFO if he were to do the angiovac (would be multidisciplinary with Interventional Cardiology assistance).  -blood cultures negative to date -Repeat Lumbar MRI today showed persistent osteomyelitis and additional paraspinous collections along the anterior margin at levels L4-5 and L5-S1, now more organized as compared to previous. Additionally showed potentially increased significant right-sided hydronephrosis. F/u Renal Cliffton Asters congruent. Bladder scan approprate, continues with appropriate UOP -Patient endorsing improved back pain and hip pain on increased pain medication. Continues with numbness at right ankle  Plan -Neurosurgery contemplating repeat evacuation + expansion of laminotomies tomorrow; f/u  -Consider IV pyelography for further evaluation of persistent hydronephrosis   -continue daptomycin to complete 8w treatment; will restart the clock if does another lumbar procedure. -last CK normal on 2/7 -pain management: Oxycodone15mg  q4h, PRN dilaudid 0.5mg  daily 20-30 mins before  PT/OT sessions -Appreciate ID rec: will need repeat mri spine in 4-5 weeks starting 06/02/2020 (around 2nd week march 2022) -PRN Albuterol     Acute renal failure 2/2 endocarditis now off HD. HD line removed 2/4.Continues to have good urine output. Will continue to monitor with strict I/Os. Some uptrend to creatinine. Some significant LE edema noted  today, potential contribution of inactivity 2/2 pain as above.   -Continue monitoring -Lokelma 5g x1 for mild hyperkalemia, monitor for improvement   Anemia of chronic disease, Anemia of critical illness. Some drop in Hgb to 6.8 on 2/9, stable following  Receiving 1 unit pRBC -continue to monitor   Hypertension.Appropriately controlled with improved pain control. On Amlodipine 10 and Coreg continue to monitor   Opioid use disorder He has been on methadone in the past but would be interested in treatment with suboxone. Will readdress this prior to discharge. Continue oxycodone prn for now.     LOS: 14 days   [name], Medical Student 06/04/2020, 10:22 AM pro

## 2020-06-04 NOTE — Progress Notes (Signed)
Neurosurgery Service Progress Note  Subjective: Complaining of worsening low back pain, more pain with ambulating, new patch of numbness on lateral right foot  Objective: Vitals:   06/03/20 2019 06/04/20 0021 06/04/20 0035 06/04/20 0618  BP: (!) 146/89  137/83 133/87  Pulse: 98  100 92  Resp: (!) 22  18 18   Temp: 99 F (37.2 C)  99.2 F (37.3 C) 99 F (37.2 C)  TempSrc: Oral  Oral Oral  SpO2: 96%  97% 97%  Weight:  82.3 kg    Height:        Physical Exam: BUE 5/5 BLE 4+/5 with new numbness in the right S1 distribution  Assessment & Plan: 45 y.o. man w/ h/o IVDU and lumbar epidural abscess s/p MIS lami and evacuation, recovering well. Then worsening LBP / new numbness, repeat MRI with recurrence of collection and stenosis, post-op changes.   -discussed the with patient, he feels like he's "going backwards" from a recovery standpoint, which is always concerning. Will discuss with his primary team, as he still has cardiac vegetations undergoing further workup / consideration of treatment, recovering thrombocytopenia. If okay to take to the OR, I talked to him about the possibility of going back to the OR tomorrow for repeat evacuation of the abscess and expanding his laminotomies to give him some more space if they recur.   [address] Steven Cortez  06/04/20 9:45 AM

## 2020-06-05 ENCOUNTER — Encounter (HOSPITAL_COMMUNITY)
Admission: EM | Disposition: A | Discharge status: Home / Self Care | Payer: Self-pay | Source: Home / Self Care | Attending: Internal Medicine

## 2020-06-05 ENCOUNTER — Inpatient Hospital Stay (HOSPITAL_COMMUNITY): Payer: Medicaid Other

## 2020-06-05 ENCOUNTER — Inpatient Hospital Stay (HOSPITAL_COMMUNITY): Payer: Medicaid Other | Admitting: Certified Registered Nurse Anesthetist

## 2020-06-05 ENCOUNTER — Encounter (HOSPITAL_COMMUNITY): Payer: Self-pay | Admitting: Internal Medicine

## 2020-06-05 DIAGNOSIS — Z792 Long term (current) use of antibiotics: Secondary | ICD-10-CM

## 2020-06-05 HISTORY — PX: LUMBAR LAMINECTOMY/ DECOMPRESSION WITH MET-RX: SHX5959

## 2020-06-05 LAB — CBC
HCT: 24.3 % — ABNORMAL LOW (ref 39.0–52.0)
Hemoglobin: 7.6 g/dL — ABNORMAL LOW (ref 13.0–17.0)
MCH: 28.1 pg (ref 26.0–34.0)
MCHC: 31.3 g/dL (ref 30.0–36.0)
MCV: 90 fL (ref 80.0–100.0)
Platelets: 190 10*3/uL (ref 150–400)
RBC: 2.7 MIL/uL — ABNORMAL LOW (ref 4.22–5.81)
RDW: 16.3 % — ABNORMAL HIGH (ref 11.5–15.5)
WBC: 6.4 10*3/uL (ref 4.0–10.5)
nRBC: 0 % (ref 0.0–0.2)

## 2020-06-05 LAB — URINALYSIS, ROUTINE W REFLEX MICROSCOPIC
Bilirubin Urine: NEGATIVE
Glucose, UA: NEGATIVE mg/dL
Ketones, ur: NEGATIVE mg/dL
Leukocytes,Ua: NEGATIVE
Nitrite: NEGATIVE
Protein, ur: 100 mg/dL — AB
Specific Gravity, Urine: 1.02 (ref 1.005–1.030)
pH: 5.5 (ref 5.0–8.0)

## 2020-06-05 LAB — URINALYSIS, MICROSCOPIC (REFLEX)

## 2020-06-05 LAB — RENAL FUNCTION PANEL
Albumin: 1.7 g/dL — ABNORMAL LOW (ref 3.5–5.0)
Anion gap: 9 (ref 5–15)
BUN: 65 mg/dL — ABNORMAL HIGH (ref 6–20)
CO2: 25 mmol/L (ref 22–32)
Calcium: 8.2 mg/dL — ABNORMAL LOW (ref 8.9–10.3)
Chloride: 103 mmol/L (ref 98–111)
Creatinine, Ser: 1.95 mg/dL — ABNORMAL HIGH (ref 0.61–1.24)
GFR, Estimated: 43 mL/min — ABNORMAL LOW (ref >60)
Glucose, Bld: 115 mg/dL — ABNORMAL HIGH (ref 70–99)
Phosphorus: 4.8 mg/dL — ABNORMAL HIGH (ref 2.5–4.6)
Potassium: 5.3 mmol/L — ABNORMAL HIGH (ref 3.5–5.1)
Sodium: 137 mmol/L (ref 135–145)

## 2020-06-05 IMAGING — RF DG LUMBAR SPINE 2-3V
1 series · 3 of 3 positions shown · non-contrast
Comparison: Lumbar spine MRI [DATE].

CLINICAL DATA: Surgery, elective. Additional history provided:
Laminectomy L4-S1. Provided fluoroscopy time 10 seconds.

EXAM:
LUMBAR SPINE - 2-3 VIEW; DG C-ARM 1-60 MIN

[Series 1: unknown protocol · 0.14mm/px · 3 of 3 slices shown]
[im 1/3]
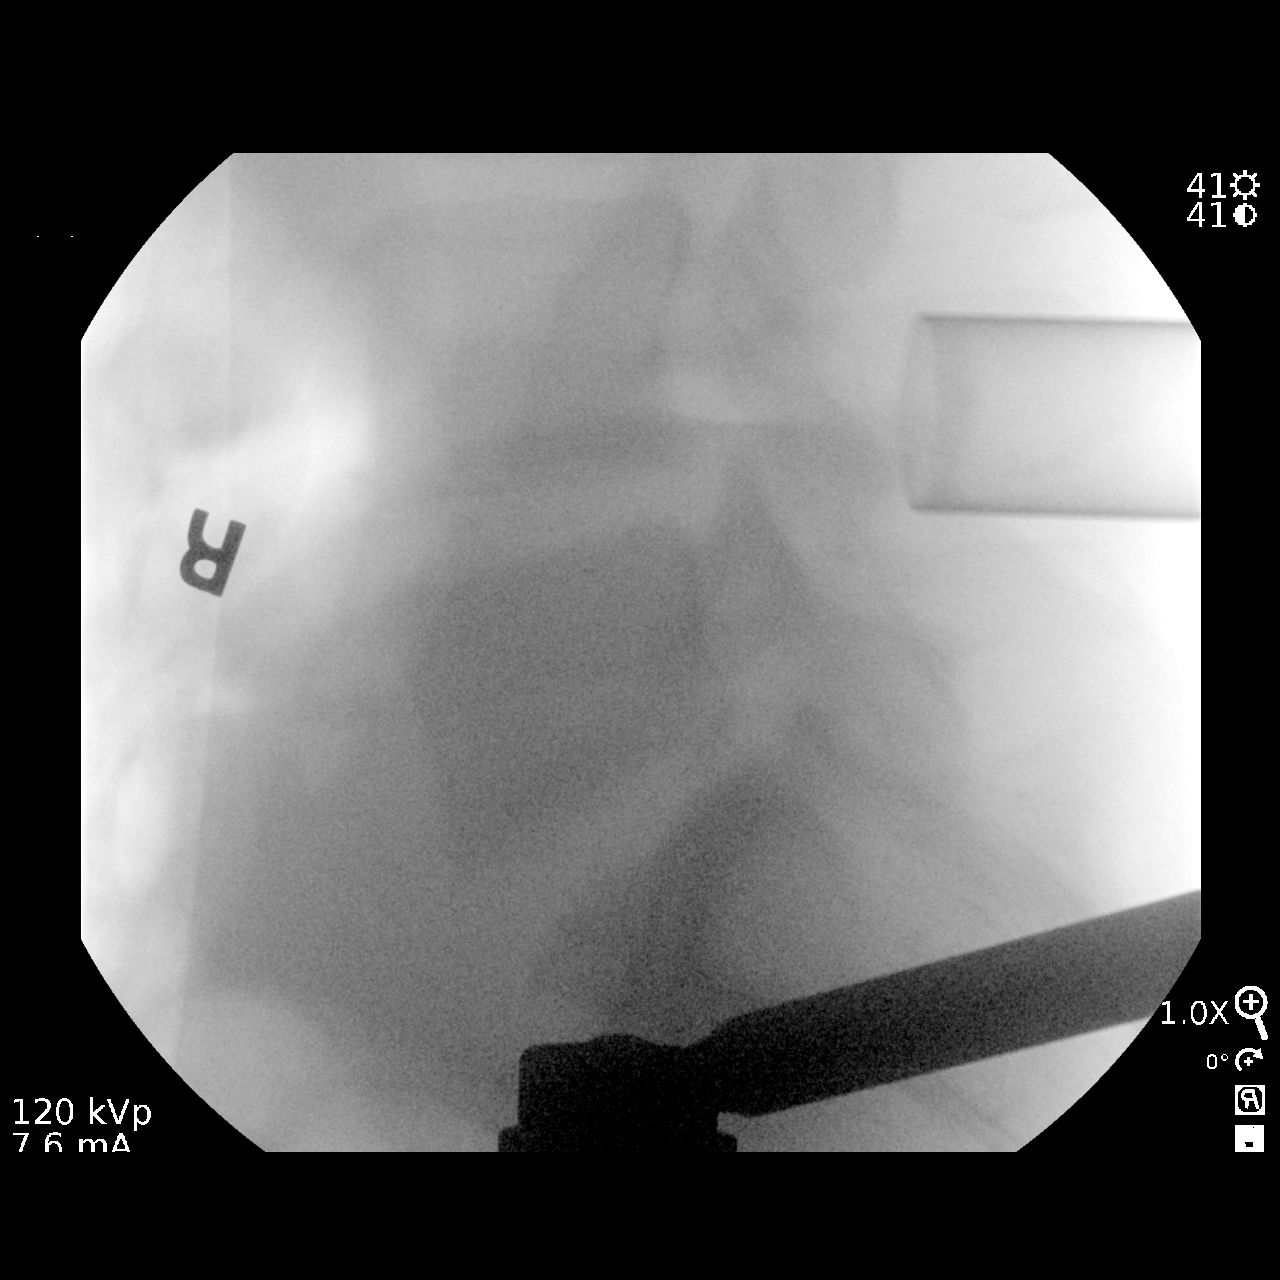
[im 2/3]
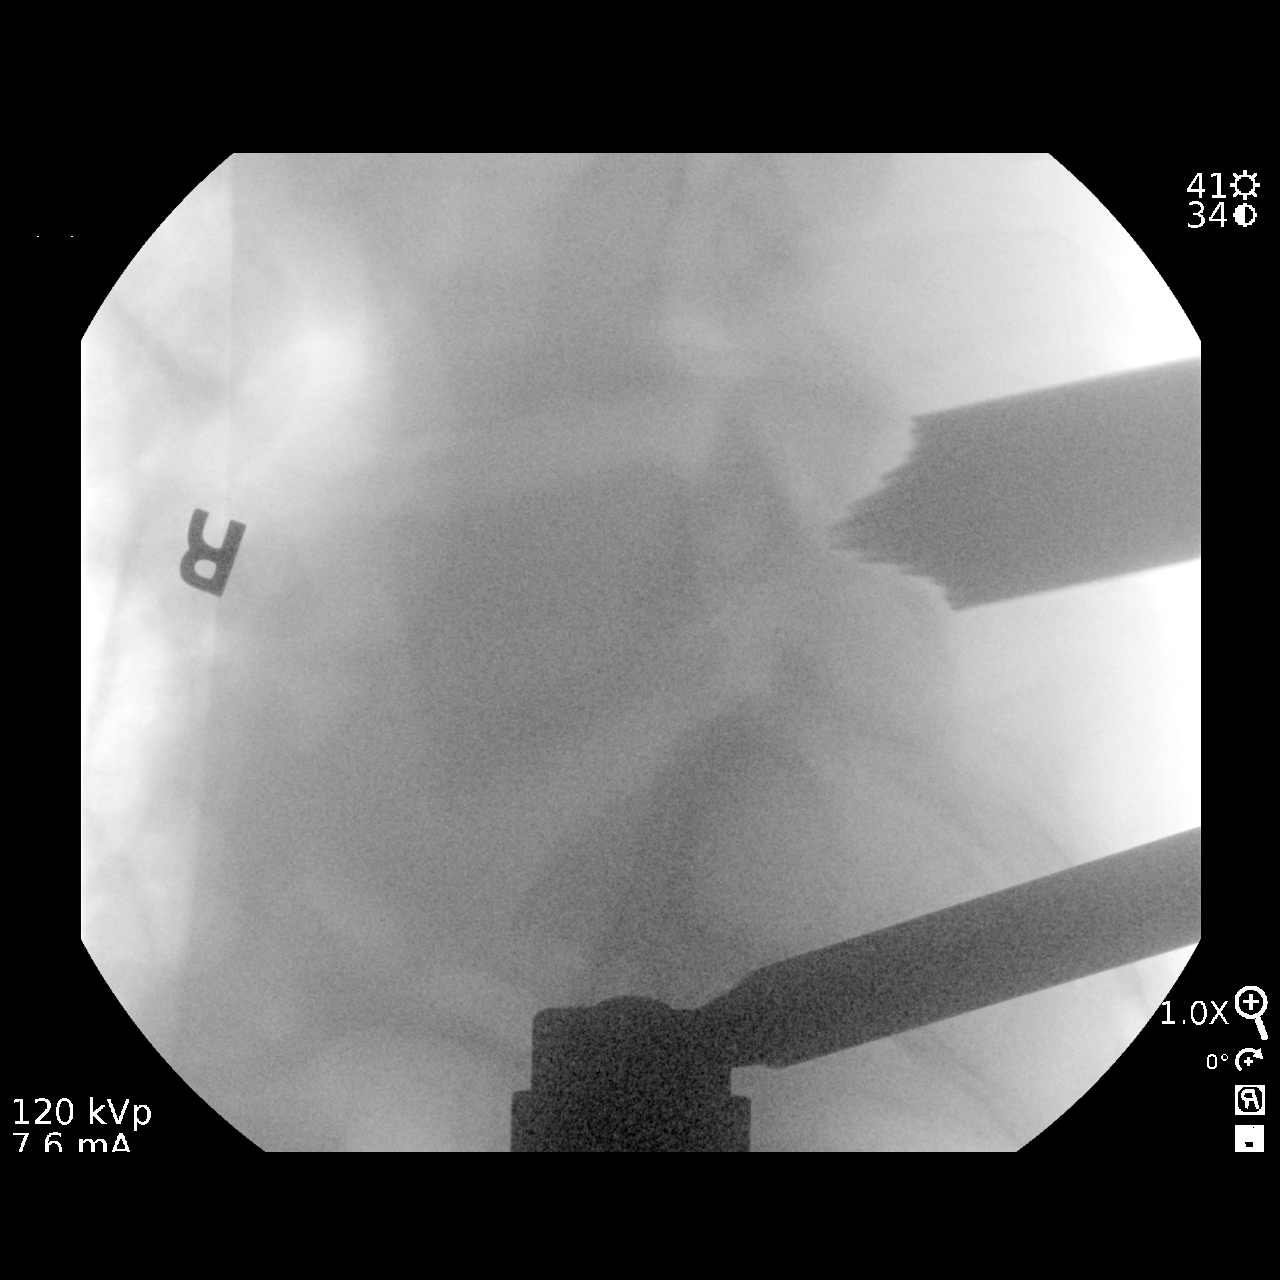
[im 3/3]
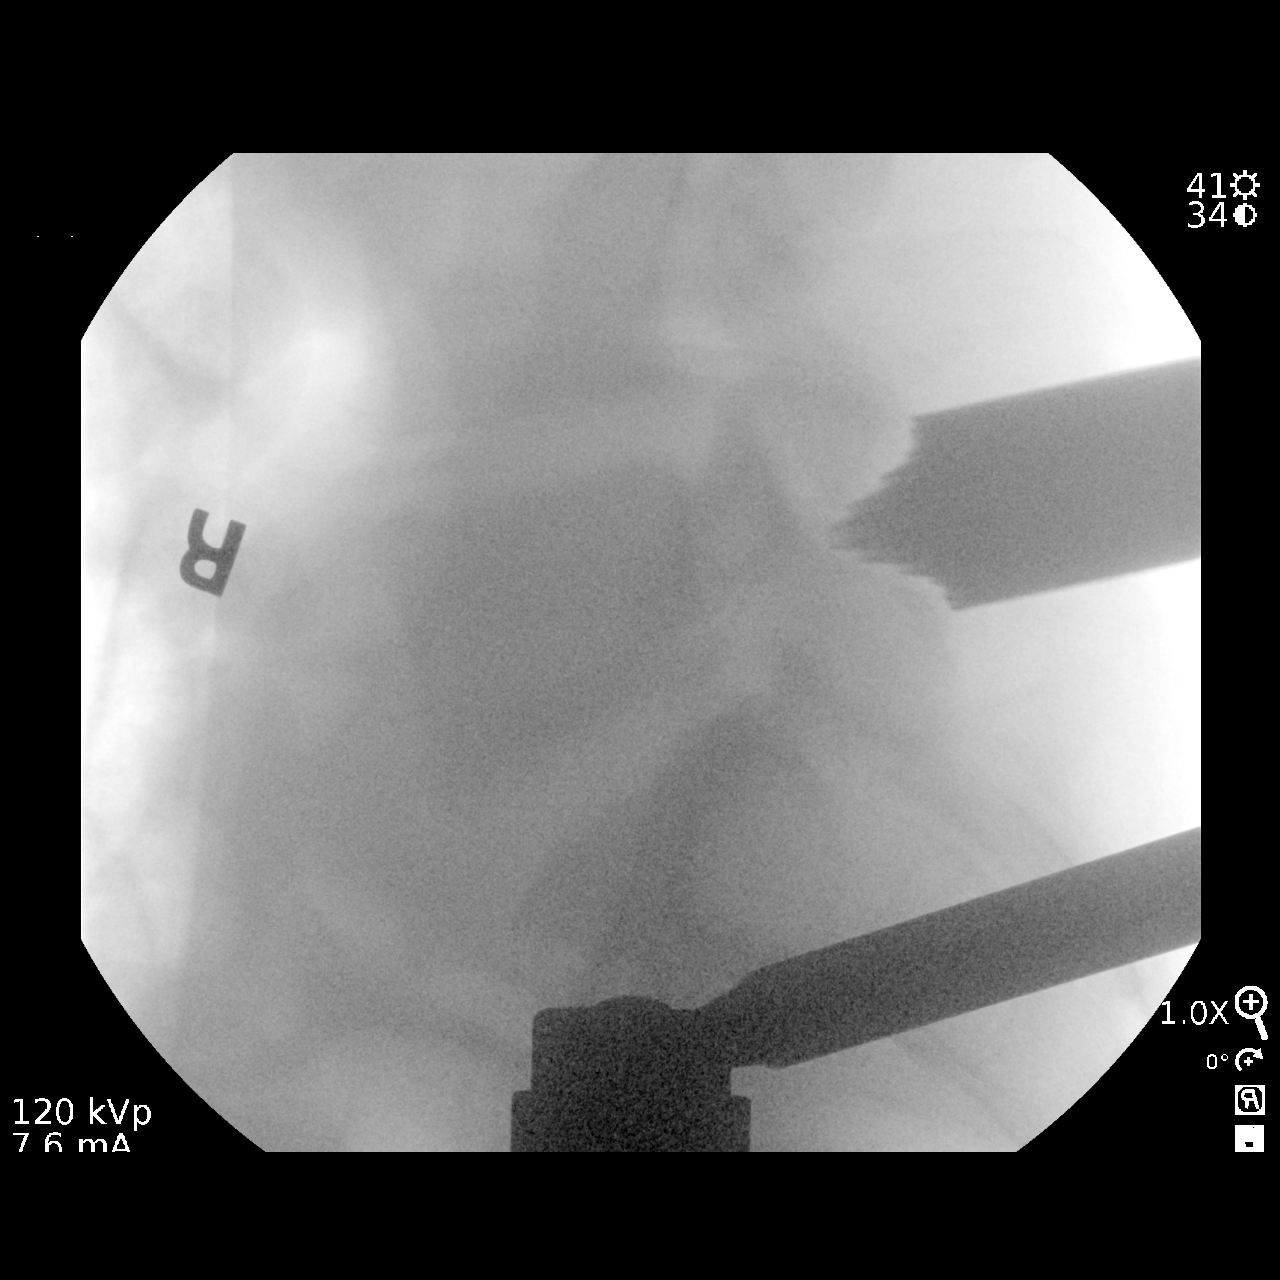

[3 of 3 positions shown; findings below may reference images not displayed]

FINDINGS: Two lateral view intraoperative fluoroscopic images of the lumbar
spine are submitted. The images demonstrate surgical instruments
projecting posterior to the L4-L5 and L5-S1 levels, and an
additional instrument projecting over the sacrum. Correlate with
procedural history.
IMPRESSION: Two intraoperative fluoroscopic images of the lumbar spine, as
described.

## 2020-06-05 IMAGING — RF DG C-ARM 1-60 MIN
1 series · 3 of 3 positions shown · non-contrast
Comparison: Lumbar spine MRI [DATE].

CLINICAL DATA: Surgery, elective. Additional history provided:
Laminectomy L4-S1. Provided fluoroscopy time 10 seconds.

EXAM:
LUMBAR SPINE - 2-3 VIEW; DG C-ARM 1-60 MIN

[Series 1: unknown protocol · 0.14mm/px · 3 of 3 slices shown]
[im 1/3]
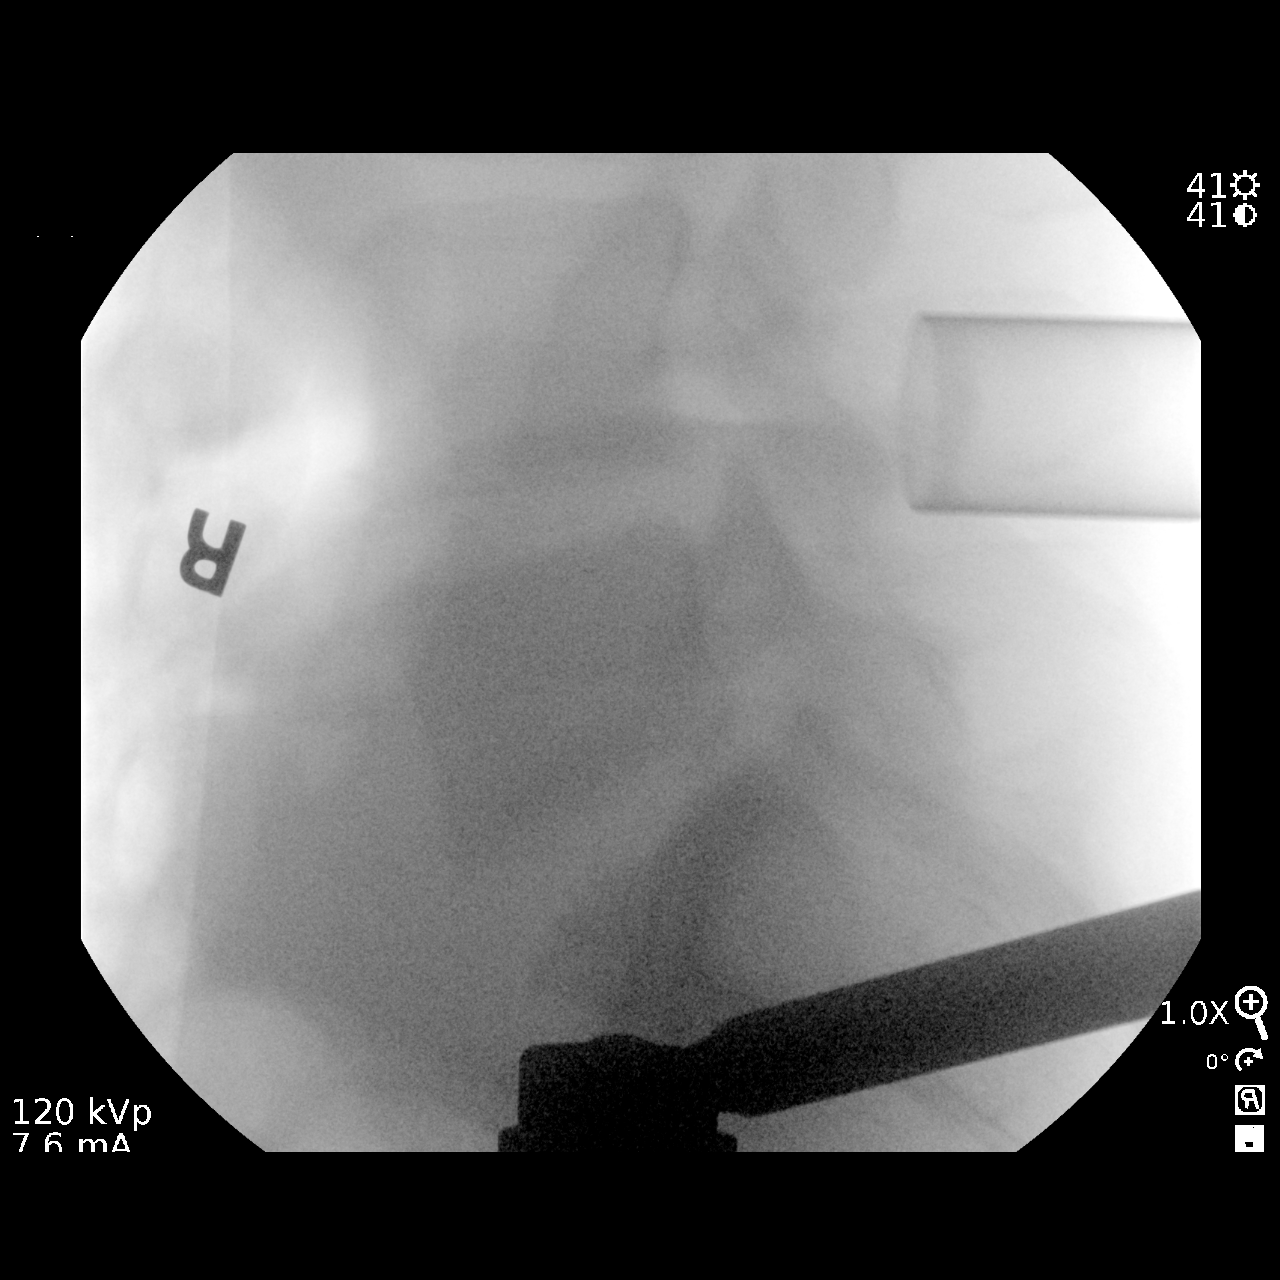
[im 2/3]
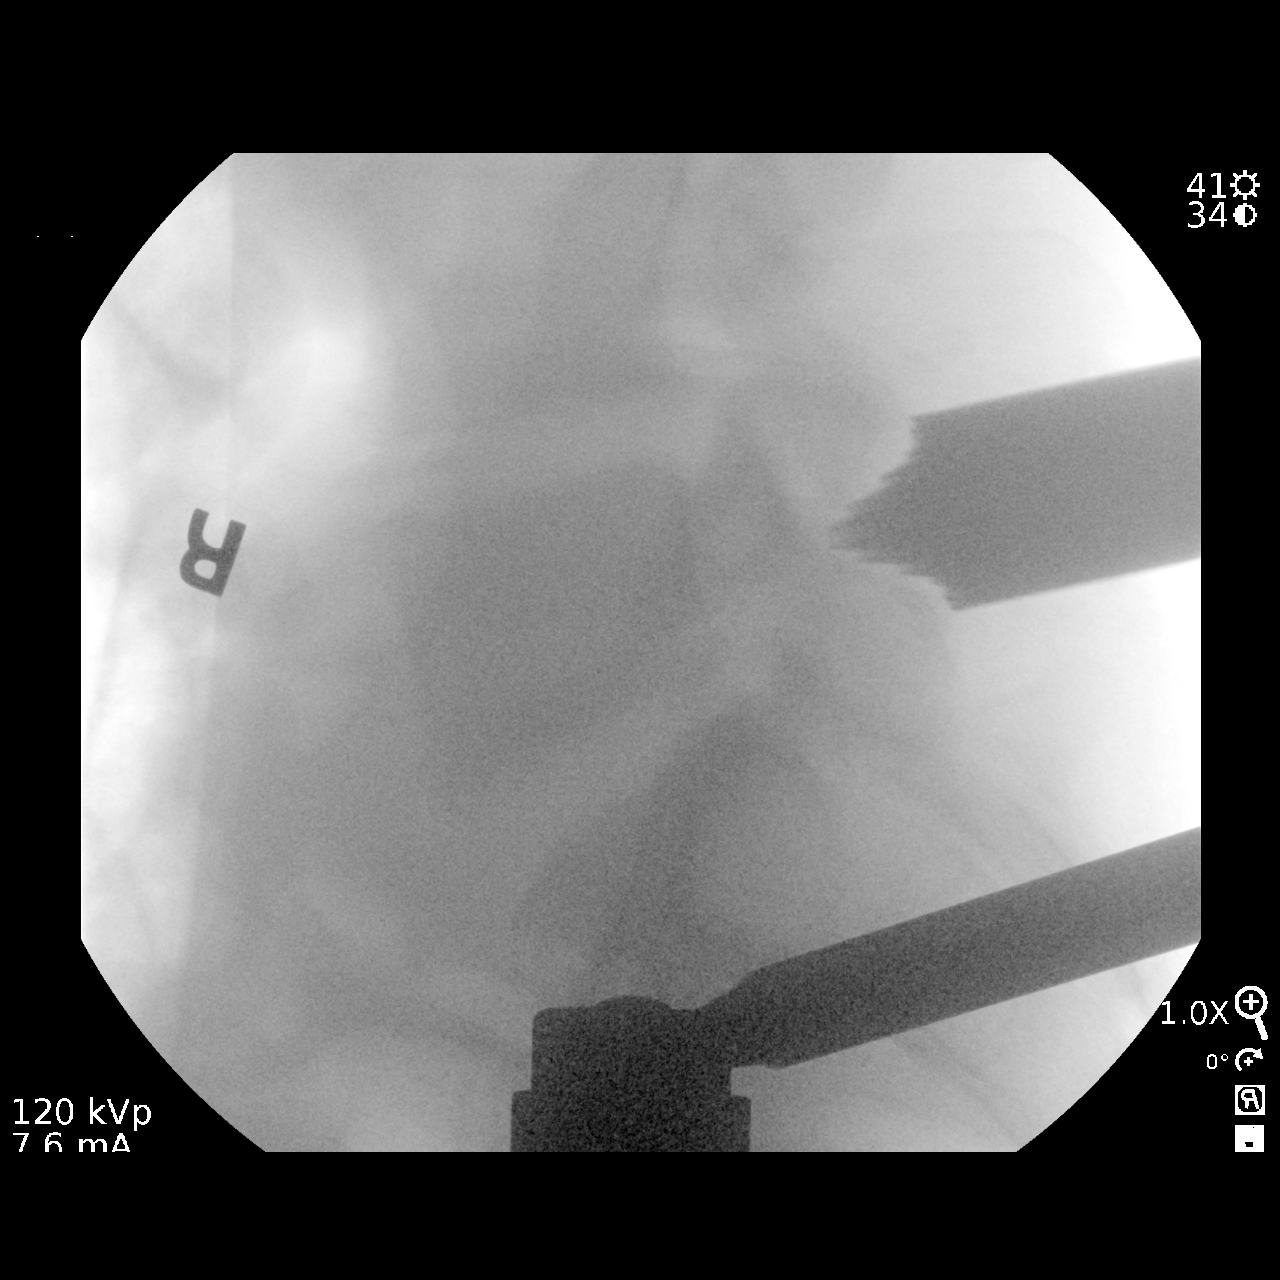
[im 3/3]
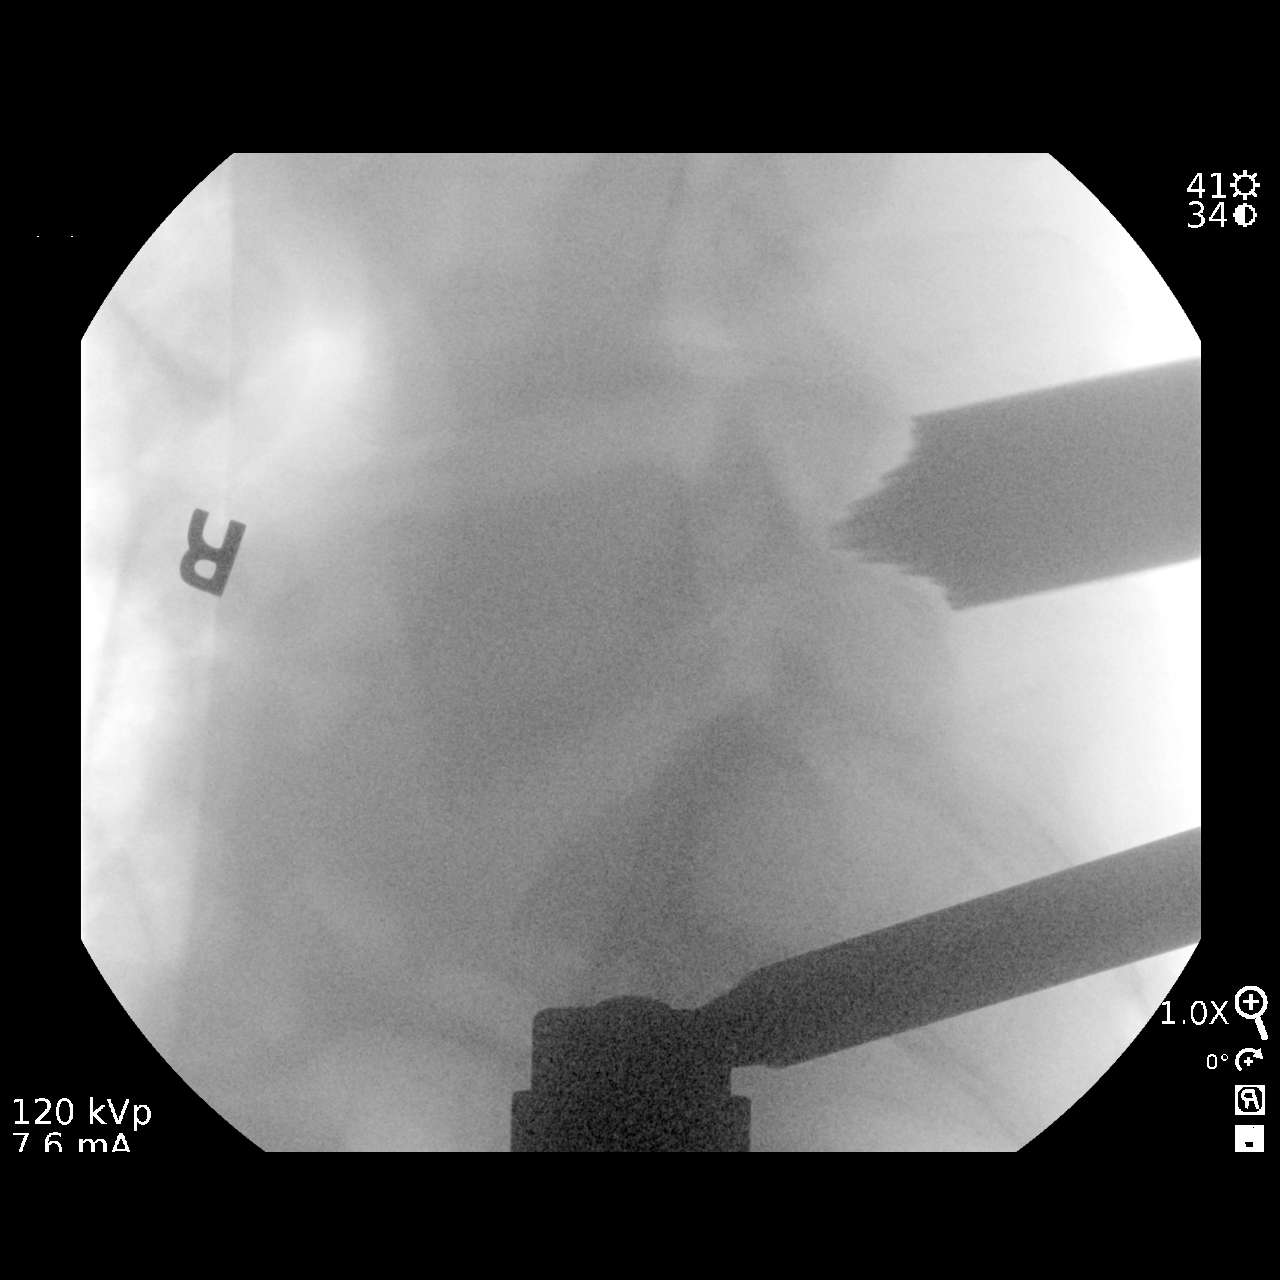

[3 of 3 positions shown; findings below may reference images not displayed]

FINDINGS: Two lateral view intraoperative fluoroscopic images of the lumbar
spine are submitted. The images demonstrate surgical instruments
projecting posterior to the L4-L5 and L5-S1 levels, and an
additional instrument projecting over the sacrum. Correlate with
procedural history.
IMPRESSION: Two intraoperative fluoroscopic images of the lumbar spine, as
described.

## 2020-06-05 IMAGING — CT CT RENAL STONE PROTOCOL
2 of 4 series · 15 of 46 positions shown, 17 images · non-contrast
Comparison: [DATE].

CLINICAL DATA: Renal failure

EXAM:
CT ABDOMEN AND PELVIS WITHOUT CONTRAST
TECHNIQUE: Multidetector CT imaging of the abdomen and pelvis was performed
following the standard protocol without oral or IV contrast.

[Series 3: renal stone 5.0 · axial · 0.92mm/px · z∈[+832,+1342]mm · 12 of 112 slices shown, 14 images]
[im 5/112  soft-tissue]
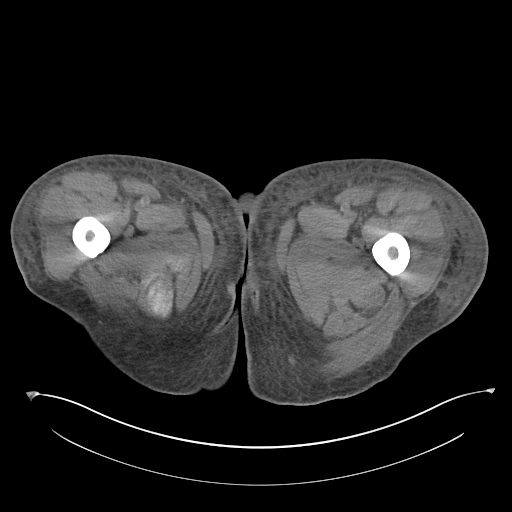
[im 5/112  bone]
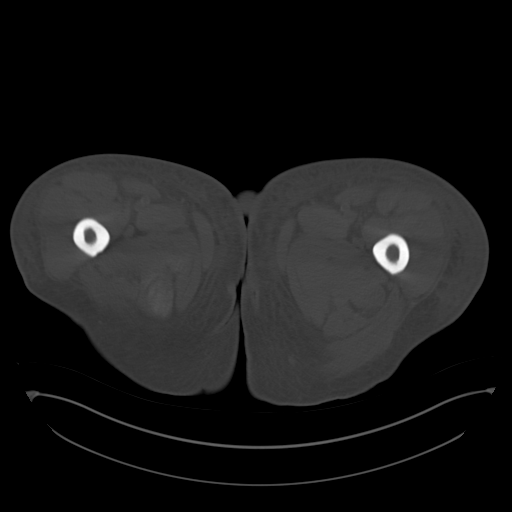
[im 14/112  soft-tissue]
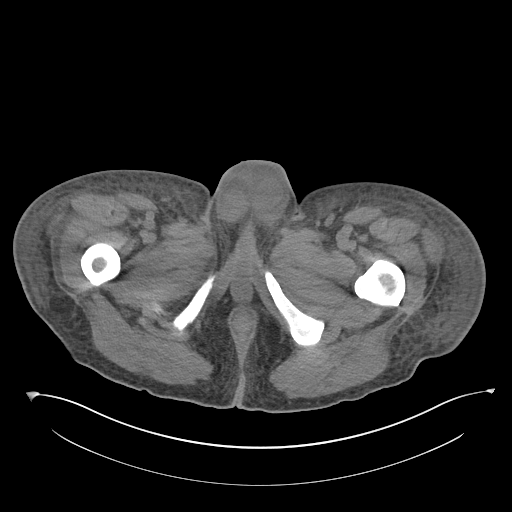
[im 24/112  soft-tissue]
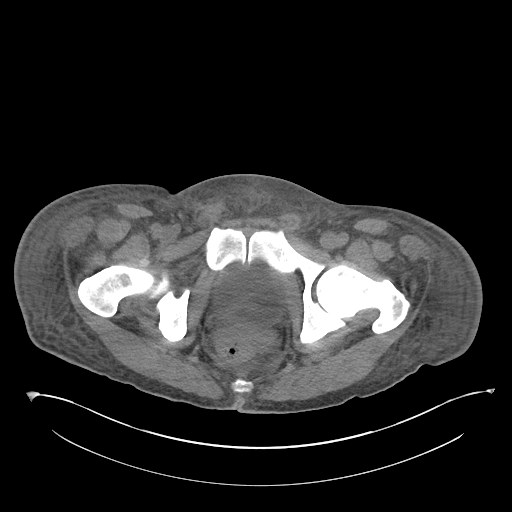
[im 33/112  soft-tissue]
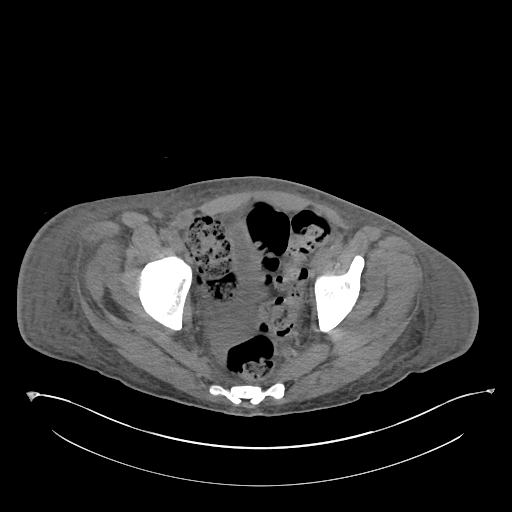
[im 42/112  soft-tissue]
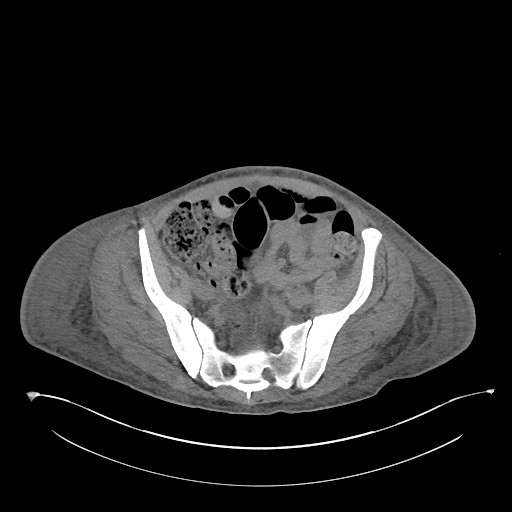
[im 51/112  soft-tissue]
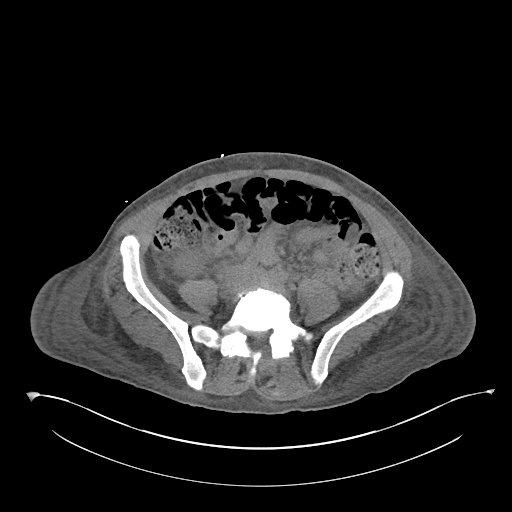
[im 61/112  soft-tissue]
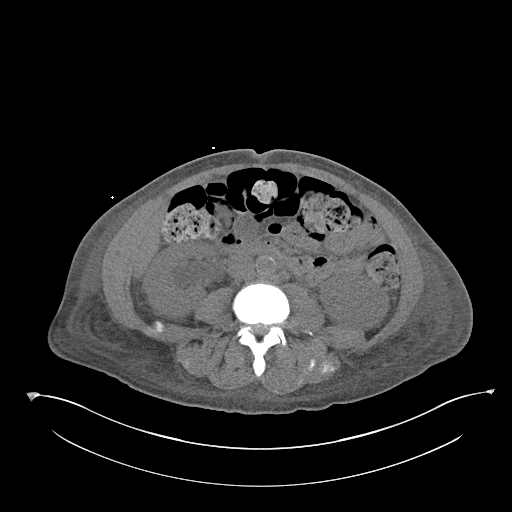
[im 70/112  soft-tissue]
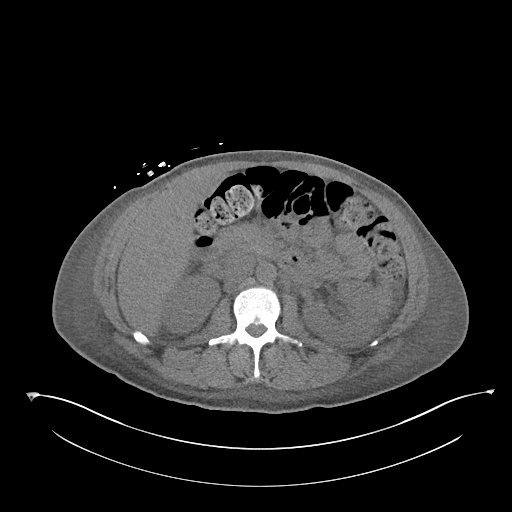
[im 79/112  soft-tissue]
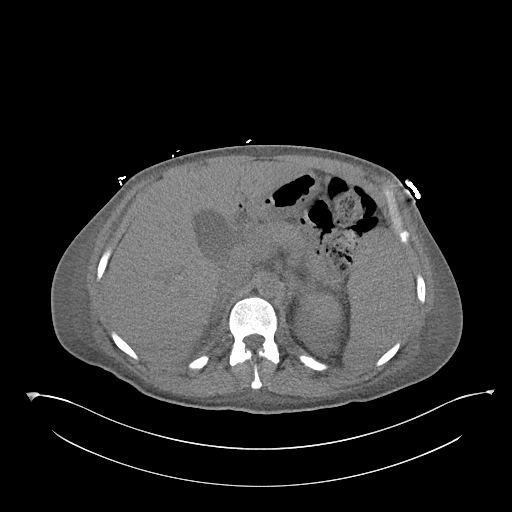
[im 79/112  bone]
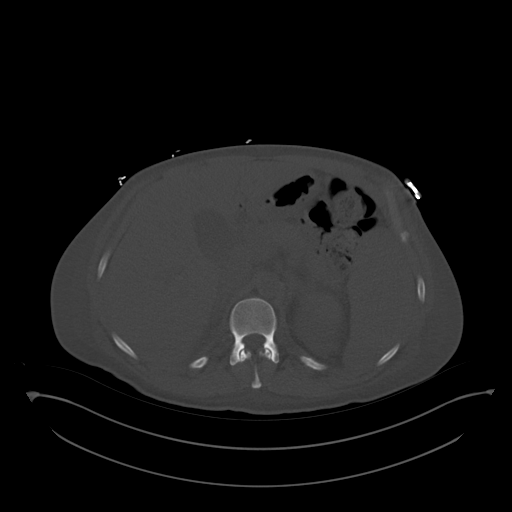
[im 88/112  soft-tissue]
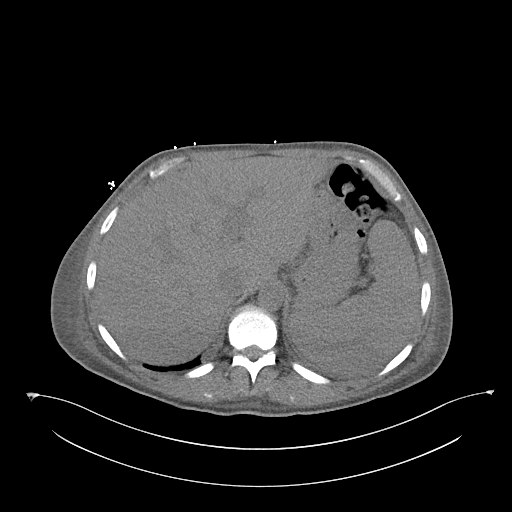
[im 98/112  soft-tissue]
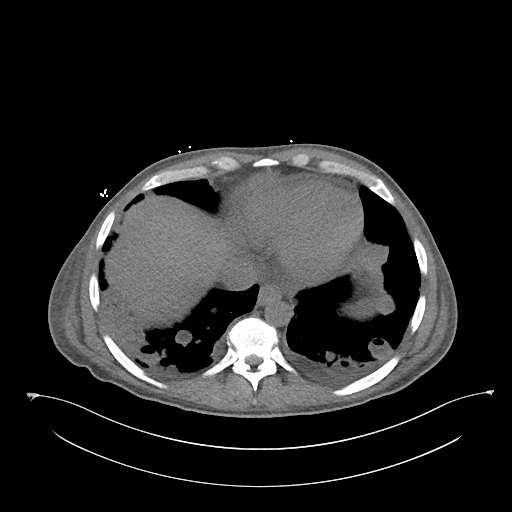
[im 107/112  soft-tissue]
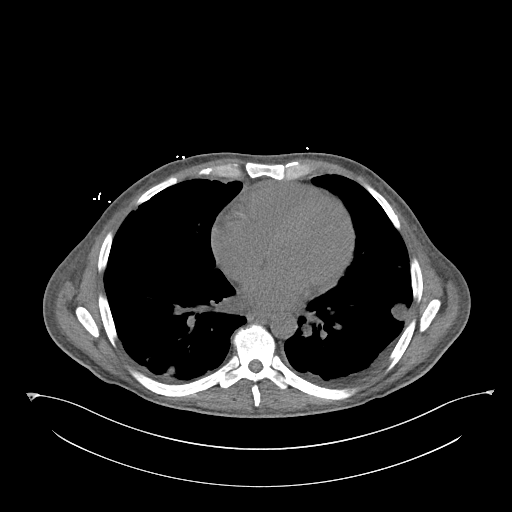

[Series 6: coronal · coronal · 1.03mm/px · 3 of 85 slices shown]
[im 29/85  soft-tissue]
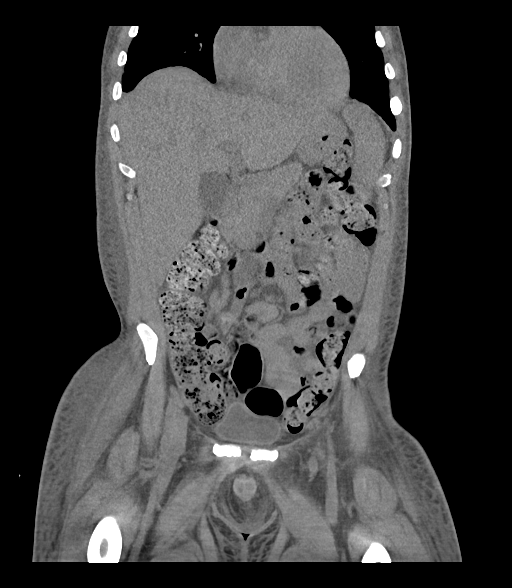
[im 38/85  soft-tissue]
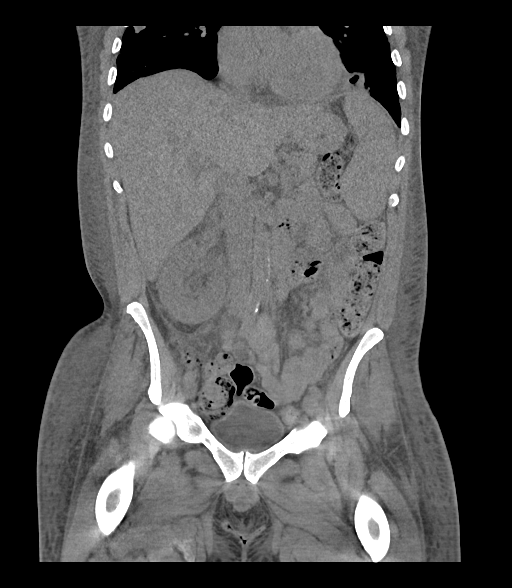
[im 47/85  soft-tissue]
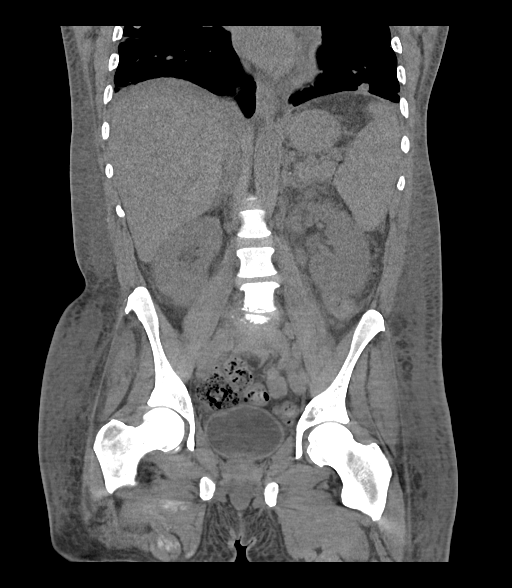

[15 of 46 positions shown; findings below may reference images not displayed]

FINDINGS: Lower chest: There are again noted multiple cavitary lesions
throughout the visualized mid and lower lung regions with
consolidation in a portion of the right lower lobe. This appearance
is similar to prior study. There is a left pleural effusion with a
small right pleural effusion, slightly larger compared to most
recent study.

Hepatobiliary: No focal liver lesions are appreciable on this
noncontrast enhanced study. Gallbladder wall is not appreciably
thickened. There is no biliary duct dilatation.

Pancreas: No evident pancreatic mass or inflammatory focus.

Spleen: Splenic prominence is again noted. No focal splenic lesions
are evident on this noncontrast enhanced study.

Adrenals/Urinary Tract: Adrenals appear unremarkable bilaterally.
There is no appreciable renal mass on either side. In comparison
with previous study, there is now mild hydronephrosis on either
side, slightly more on the right than on the left. No renal or
ureteral calculus is evident. Urinary bladder wall is not
appreciably thickened. There is moderate urine in the bladder
currently.

Stomach/Bowel: There is moderate stool throughout the colon. There
is no appreciable bowel wall thickening. There is mild mesenteric
thickening, similar to prior study. There is no evident bowel
obstruction. Terminal ileum appears normal. There is no evident free
air or portal venous air.

Vascular/Lymphatic: No abdominal aortic aneurysm. There are
scattered foci of aorta and common iliac artery atherosclerotic
calcification. There is no evident adenopathy by size criteria in
the abdomen or pelvis.

Reproductive: Prostate and seminal vesicles are normal in size and
contour.

Other: Appendix region appears unremarkable without inflammation. No
abscess evident in the abdomen pelvis. There is mild ascites in the
dependent portion of the pelvis. There is diffuse soft tissue edema
throughout the abdomen and pelvic wall regions.

Musculoskeletal: There is degenerative change in the lower lumbar
region. There is subtle endplate irregularity at L4-5 and L5-S1
which is subtly more notable than on previous study. No blastic or
lytic bone lesions are evident. There are no intramuscular lesions.
IMPRESSION: 1. Extensive nodular opacities throughout the visualized lungs with
consolidation again noted in the right lower lobe. Multiple areas of
cavitation evident. Appearance consistent with combination of
pneumonia and septic emboli. Small pleural effusions bilaterally
noted.

2. Mild hydronephrosis is now present on each side, a finding not
present on most recent prior study. No obstructing focus seen in
either ureter. Urinary bladder contains moderate urine is not
grossly distended. Question developing pyelonephritis on each side
given this appearance.

3. Widespread anasarca again noted. Mild mesenteric edema also noted
in the abdomen and pelvis, stable. Mild ascites.

4. Subtle endplate irregularity at L4-5 and L5-S1. Underlying
degenerative type change in these areas. The earliest changes of
discitis could present in this manner. MR would be the optimum study
of choice to further evaluate if discitis is of concern clinically.

5.  Aortic Atherosclerosis ([U3]-[U3]).

6.  Stable splenic prominence.

## 2020-06-05 SURGERY — LUMBAR LAMINECTOMY/ DECOMPRESSION WITH MET-RX
Anesthesia: General | Site: Back

## 2020-06-05 MED ORDER — PROPOFOL 10 MG/ML IV BOLUS
INTRAVENOUS | Status: AC
  Filled 2020-06-05: qty 20

## 2020-06-05 MED ORDER — THROMBIN 5000 UNITS EX SOLR
CUTANEOUS | Status: AC
  Filled 2020-06-05: qty 5000

## 2020-06-05 MED ORDER — 0.9 % SODIUM CHLORIDE (POUR BTL) OPTIME
TOPICAL | Status: DC | PRN
  Administered 2020-06-05: 1000 mL

## 2020-06-05 MED ORDER — CHLORHEXIDINE GLUCONATE 0.12 % MT SOLN
15.0000 mL | OROMUCOSAL | Status: AC
  Filled 2020-06-05: qty 15

## 2020-06-05 MED ORDER — CHLORHEXIDINE GLUCONATE 0.12 % MT SOLN
OROMUCOSAL | Status: AC
  Administered 2020-06-05: 15 mL via OROMUCOSAL
  Filled 2020-06-05: qty 15

## 2020-06-05 MED ORDER — SODIUM CHLORIDE 0.9 % IV SOLN: INTRAVENOUS | Status: DC | PRN

## 2020-06-05 MED ORDER — SUGAMMADEX SODIUM 200 MG/2ML IV SOLN
INTRAVENOUS | Status: DC | PRN
  Administered 2020-06-05: 200 mg via INTRAVENOUS

## 2020-06-05 MED ORDER — MEPERIDINE HCL 25 MG/ML IJ SOLN
6.2500 mg | INTRAMUSCULAR | Status: DC | PRN
Start: 2020-06-05 — End: 2020-06-05

## 2020-06-05 MED ORDER — PHENYLEPHRINE 40 MCG/ML (10ML) SYRINGE FOR IV PUSH (FOR BLOOD PRESSURE SUPPORT)
PREFILLED_SYRINGE | INTRAVENOUS | Status: DC | PRN
  Administered 2020-06-05: 80 ug via INTRAVENOUS

## 2020-06-05 MED ORDER — MIDAZOLAM HCL 2 MG/2ML IJ SOLN
INTRAMUSCULAR | Status: DC | PRN
  Administered 2020-06-05: 2 mg via INTRAVENOUS

## 2020-06-05 MED ORDER — PROPOFOL 10 MG/ML IV BOLUS
INTRAVENOUS | Status: DC | PRN
  Administered 2020-06-05: 100 mg via INTRAVENOUS
  Administered 2020-06-05: 50 mg via INTRAVENOUS

## 2020-06-05 MED ORDER — KETAMINE HCL 10 MG/ML IJ SOLN
INTRAMUSCULAR | Status: DC | PRN
  Administered 2020-06-05: 50 mg via INTRAVENOUS

## 2020-06-05 MED ORDER — LACTATED RINGERS IV SOLN: INTRAVENOUS | Status: DC

## 2020-06-05 MED ORDER — MIDAZOLAM HCL 2 MG/2ML IJ SOLN
INTRAMUSCULAR | Status: AC
  Filled 2020-06-05: qty 2

## 2020-06-05 MED ORDER — ROCURONIUM BROMIDE 10 MG/ML (PF) SYRINGE
PREFILLED_SYRINGE | INTRAVENOUS | Status: AC
  Filled 2020-06-05: qty 20

## 2020-06-05 MED ORDER — ALBUMIN HUMAN 5 % IV SOLN: INTRAVENOUS | Status: DC | PRN

## 2020-06-05 MED ORDER — LIDOCAINE-EPINEPHRINE 1 %-1:100000 IJ SOLN
INTRAMUSCULAR | Status: DC | PRN
  Administered 2020-06-05: 10 mL

## 2020-06-05 MED ORDER — ONDANSETRON HCL 4 MG/2ML IJ SOLN
INTRAMUSCULAR | Status: DC | PRN
  Administered 2020-06-05: 4 mg via INTRAVENOUS

## 2020-06-05 MED ORDER — ROCURONIUM BROMIDE 10 MG/ML (PF) SYRINGE
PREFILLED_SYRINGE | INTRAVENOUS | Status: DC | PRN
  Administered 2020-06-05 (×3): 50 mg via INTRAVENOUS

## 2020-06-05 MED ORDER — HYDROMORPHONE HCL 1 MG/ML IJ SOLN: 0.2500 mg | INTRAMUSCULAR | Status: DC | PRN

## 2020-06-05 MED ORDER — ONDANSETRON HCL 4 MG/2ML IJ SOLN
INTRAMUSCULAR | Status: AC
  Filled 2020-06-05: qty 2

## 2020-06-05 MED ORDER — DEXAMETHASONE SODIUM PHOSPHATE 10 MG/ML IJ SOLN
INTRAMUSCULAR | Status: AC
  Filled 2020-06-05: qty 1

## 2020-06-05 MED ORDER — LIDOCAINE 2% (20 MG/ML) 5 ML SYRINGE
INTRAMUSCULAR | Status: AC
  Filled 2020-06-05: qty 5

## 2020-06-05 MED ORDER — FENTANYL CITRATE (PF) 250 MCG/5ML IJ SOLN
INTRAMUSCULAR | Status: AC
  Filled 2020-06-05: qty 5

## 2020-06-05 MED ORDER — DEXMEDETOMIDINE (PRECEDEX) IN NS 20 MCG/5ML (4 MCG/ML) IV SYRINGE
PREFILLED_SYRINGE | INTRAVENOUS | Status: DC | PRN
  Administered 2020-06-05: 8 ug via INTRAVENOUS
  Administered 2020-06-05: 4 ug via INTRAVENOUS

## 2020-06-05 MED ORDER — DEXAMETHASONE SODIUM PHOSPHATE 10 MG/ML IJ SOLN
INTRAMUSCULAR | Status: DC | PRN
  Administered 2020-06-05: 10 mg via INTRAVENOUS

## 2020-06-05 MED ORDER — KETAMINE HCL 50 MG/5ML IJ SOSY
PREFILLED_SYRINGE | INTRAMUSCULAR | Status: AC
  Filled 2020-06-05: qty 5

## 2020-06-05 MED ORDER — SODIUM ZIRCONIUM CYCLOSILICATE 10 G PO PACK
10.0000 g | PACK | Freq: Once | ORAL | Status: DC
  Filled 2020-06-05: qty 1

## 2020-06-05 MED ORDER — LIDOCAINE-EPINEPHRINE 1 %-1:100000 IJ SOLN
INTRAMUSCULAR | Status: AC
  Filled 2020-06-05: qty 1

## 2020-06-05 MED ORDER — THROMBIN 5000 UNITS EX SOLR
OROMUCOSAL | Status: DC | PRN
  Administered 2020-06-05: 5 mL via TOPICAL

## 2020-06-05 MED ORDER — PROMETHAZINE HCL 25 MG/ML IJ SOLN: 6.2500 mg | INTRAMUSCULAR | Status: DC | PRN

## 2020-06-05 MED ORDER — DEXMEDETOMIDINE (PRECEDEX) IN NS 20 MCG/5ML (4 MCG/ML) IV SYRINGE
PREFILLED_SYRINGE | INTRAVENOUS | Status: AC
  Filled 2020-06-05: qty 5

## 2020-06-05 MED ORDER — PHENYLEPHRINE 40 MCG/ML (10ML) SYRINGE FOR IV PUSH (FOR BLOOD PRESSURE SUPPORT)
PREFILLED_SYRINGE | INTRAVENOUS | Status: AC
  Filled 2020-06-05: qty 10

## 2020-06-05 MED ORDER — FENTANYL CITRATE (PF) 250 MCG/5ML IJ SOLN
INTRAMUSCULAR | Status: DC | PRN
  Administered 2020-06-05 (×3): 50 ug via INTRAVENOUS
  Administered 2020-06-05: 25 ug via INTRAVENOUS
  Administered 2020-06-05: 50 ug via INTRAVENOUS

## 2020-06-05 MED ORDER — PHENYLEPHRINE HCL-NACL 10-0.9 MG/250ML-% IV SOLN
INTRAVENOUS | Status: DC | PRN
  Administered 2020-06-05: 20 ug/min via INTRAVENOUS

## 2020-06-05 MED ORDER — LIDOCAINE 2% (20 MG/ML) 5 ML SYRINGE
INTRAMUSCULAR | Status: DC | PRN
  Administered 2020-06-05: 100 mg via INTRAVENOUS

## 2020-06-05 SURGICAL SUPPLY — 47 items
BAND RUBBER #18 3X1/16 STRL (MISCELLANEOUS) ×4 IMPLANT
BLADE CLIPPER SURG (BLADE) IMPLANT
BLADE SURG 11 STRL SS (BLADE) ×2 IMPLANT
BUR PRECISION FLUTE 5.0 (BURR) IMPLANT
BUR PRECISION MATCH 3.0 13 (BURR) ×2 IMPLANT
CANISTER SUCT 3000ML PPV (MISCELLANEOUS) ×2 IMPLANT
COVER WAND RF STERILE (DRAPES) ×2 IMPLANT
DECANTER SPIKE VIAL GLASS SM (MISCELLANEOUS) ×2 IMPLANT
DERMABOND ADVANCED (GAUZE/BANDAGES/DRESSINGS) ×1
DERMABOND ADVANCED .7 DNX12 (GAUZE/BANDAGES/DRESSINGS) ×1 IMPLANT
DRAPE C-ARM 42X72 X-RAY (DRAPES) ×4 IMPLANT
DRAPE LAPAROTOMY 100X72X124 (DRAPES) ×2 IMPLANT
DRAPE MICROSCOPE LEICA (MISCELLANEOUS) ×2 IMPLANT
DRAPE SURG 17X23 STRL (DRAPES) ×2 IMPLANT
DURAPREP 26ML APPLICATOR (WOUND CARE) ×2 IMPLANT
ELECT BLADE 6.5 EXT (BLADE) ×2 IMPLANT
ELECT REM PT RETURN 9FT ADLT (ELECTROSURGICAL) ×2
ELECTRODE REM PT RTRN 9FT ADLT (ELECTROSURGICAL) ×1 IMPLANT
GAUZE 4X4 16PLY RFD (DISPOSABLE) IMPLANT
GAUZE SPONGE 4X4 12PLY STRL (GAUZE/BANDAGES/DRESSINGS) IMPLANT
GLOVE BIOGEL PI IND STRL 7.5 (GLOVE) ×1 IMPLANT
GLOVE BIOGEL PI INDICATOR 7.5 (GLOVE) ×1
GLOVE ECLIPSE 7.5 STRL STRAW (GLOVE) ×2 IMPLANT
GLOVE EXAM NITRILE LRG STRL (GLOVE) IMPLANT
GLOVE EXAM NITRILE XL STR (GLOVE) IMPLANT
GLOVE EXAM NITRILE XS STR PU (GLOVE) IMPLANT
GOWN STRL REUS W/ TWL LRG LVL3 (GOWN DISPOSABLE) ×2 IMPLANT
GOWN STRL REUS W/ TWL XL LVL3 (GOWN DISPOSABLE) ×2 IMPLANT
GOWN STRL REUS W/TWL 2XL LVL3 (GOWN DISPOSABLE) IMPLANT
GOWN STRL REUS W/TWL LRG LVL3 (GOWN DISPOSABLE) ×2
GOWN STRL REUS W/TWL XL LVL3 (GOWN DISPOSABLE) ×2
HEMOSTAT POWDER KIT SURGIFOAM (HEMOSTASIS) ×2 IMPLANT
KIT BASIN OR (CUSTOM PROCEDURE TRAY) ×2 IMPLANT
KIT TURNOVER KIT B (KITS) ×2 IMPLANT
NEEDLE HYPO 22GX1.5 SAFETY (NEEDLE) ×2 IMPLANT
NEEDLE SPNL 18GX3.5 QUINCKE PK (NEEDLE) ×2 IMPLANT
NS IRRIG 1000ML POUR BTL (IV SOLUTION) ×2 IMPLANT
PACK LAMINECTOMY NEURO (CUSTOM PROCEDURE TRAY) ×2 IMPLANT
PAD ARMBOARD 7.5X6 YLW CONV (MISCELLANEOUS) IMPLANT
SPONGE LAP 4X18 RFD (DISPOSABLE) IMPLANT
SUT MNCRL AB 3-0 PS2 18 (SUTURE) ×2 IMPLANT
SUT VIC AB 0 CT1 18XCR BRD8 (SUTURE) IMPLANT
SUT VIC AB 0 CT1 8-18 (SUTURE)
SUT VIC AB 2-0 CP2 18 (SUTURE) ×2 IMPLANT
TOWEL GREEN STERILE (TOWEL DISPOSABLE) ×2 IMPLANT
TOWEL GREEN STERILE FF (TOWEL DISPOSABLE) ×2 IMPLANT
WATER STERILE IRR 1000ML POUR (IV SOLUTION) ×2 IMPLANT

## 2020-06-05 NOTE — Progress Notes (Signed)
Neurosurgery Service Progress Note  Subjective: Stable pain and numbness, no new weakness  Objective: Vitals:   06/04/20 1815 06/04/20 2011 06/05/20 0400 06/05/20 0434  BP: (!) 142/83 137/86  (!) 155/99  Pulse: 96 95  92  Resp:  18  18  Temp:  99.1 F (37.3 C)  97.8 F (36.6 C)  TempSrc:  Oral  Oral  SpO2:  97%  97%  Weight:   82.6 kg   Height:        Physical Exam: BUE 5/5 BLE 4+/5 with stable numbness in the right S1 distribution  Assessment & Plan: 45 y.o. man w/ h/o IVDU and lumbar epidural abscess s/p MIS lami and evacuation, recovering well. Then worsening LBP / new numbness, repeat MRI with recurrence of collection and stenosis, post-op changes.   -OR today for repeat evacuation of abscess / decompression  Jadene Pierini  06/05/20 7:58 AM

## 2020-06-05 NOTE — Anesthesia Preprocedure Evaluation (Addendum)
Anesthesia Evaluation  Patient identified by MRN, date of birth, ID band Patient awake    Reviewed: Allergy & Precautions, NPO status , Patient's Chart, lab work & pertinent test results  Airway Mallampati: II  TM Distance: >3 FB Neck ROM: Full    Dental  (+) Loose, Dental Advisory Given, Chipped, Poor Dentition, Missing   Pulmonary Current Smoker and Patient abstained from smoking.,    Pulmonary exam normal breath sounds clear to auscultation       Cardiovascular Normal cardiovascular exam+ Valvular Problems/Murmurs  Rhythm:Regular Rate:Normal  ECG: SR, rate 91  Endocarditis  ECHO: 1. Left ventricular ejection fraction, by estimation, is 60 to 65%. The left ventricle has normal function. The left ventricle has no regional wall motion abnormalities. There is mild left ventricular hypertrophy. Left ventricular diastolic parameters were normal. 2. Right ventricular systolic function is normal. The right ventricular size is normal. 3. The mitral valve is normal in structure. No evidence of mitral valve regurgitation. 4. Large mobile echogenic mass noted on the RV side of the tricuspid valve septal leaflet, measuring 1 x 3 cm, suggestive of vegetation. The tricuspid valve is abnormal. 5. The aortic valve is tricuspid. Aortic valve regurgitation is not visualized. 6. The inferior vena cava is normal in size with greater than 50% respiratory variability, suggesting right atrial pressure of 3 mmHg.   Neuro/Psych negative neurological ROS  negative psych ROS   GI/Hepatic negative GI ROS, (+)     substance abuse  IV drug use, Hepatitis -, C  Endo/Other  negative endocrine ROS  Renal/GU CRF, ARF and ESRFRenal disease     Musculoskeletal negative musculoskeletal ROS (+)   Abdominal   Peds  Hematology  (+) anemia ,   Anesthesia Other Findings EPIDURAL ABSCESS  Reproductive/Obstetrics                            Anesthesia Physical Anesthesia Plan  ASA: III  Anesthesia Plan: General   Post-op Pain Management:    Induction: Intravenous  PONV Risk Score and Plan: 2 and Ondansetron, Dexamethasone, Treatment may vary due to age or medical condition and Midazolam  Airway Management Planned: Oral ETT  Additional Equipment: None  Intra-op Plan:   Post-operative Plan: Extubation in OR  Informed Consent: I have reviewed the patients History and Physical, chart, labs and discussed the procedure including the risks, benefits and alternatives for the proposed anesthesia with the patient or authorized representative who has indicated his/her understanding and acceptance.     Dental advisory given  Plan Discussed with: CRNA  Anesthesia Plan Comments:        Anesthesia Quick Evaluation

## 2020-06-05 NOTE — Progress Notes (Signed)
Neurosurgery Service Post-operative progress note  Assessment & Plan: 45 y.o. man s/p repeat MIS laminectomy, seen in PACU, MAEx4, recovering well. Intra-op findings consistent with epidural phlegmon, no frank abscess / pus. Of note, per anesthesia, during intubation he had some laryngospasm. Then after flipping we had to turn him back supine due to dec'd end tital CO2 - hard a significant amount of material suctioned through his inline suction. Following suctioning, flipped prone without further incident.  -activity as tolerated -no brace needed -okay for DVT chemoprophylaxis on 2/13  Jadene Pierini  06/05/20 3:52 PM

## 2020-06-05 NOTE — Progress Notes (Signed)
Pt with a bladderscan results of . Pt hasn't voided since 0700.

## 2020-06-05 NOTE — Transfer of Care (Signed)
Immediate Anesthesia Transfer of Care Note  Patient: Steven Cortez  Procedure(s) Performed: REPEAT LUMBAR FOUR-FIVE , LUMBAR FIVE- SACRAL ONE LUMBAR LAMINECTOMY/ DECOMPRESSION WITH MET-RX (N/A Back)  Patient Location: PACU  Anesthesia Type:General  Level of Consciousness: awake, alert  and oriented  Airway & Oxygen Therapy: Patient Spontanous Breathing and Patient connected to face mask oxygen  Post-op Assessment: Report given to RN and Post -op Vital signs reviewed and stable  Post vital signs: Reviewed and stable  Last Vitals:  Vitals Value Taken Time  BP 120/69 06/05/20 1609  Temp    Pulse 90 06/05/20 1612  Resp 11 06/05/20 1612  SpO2 98 % 06/05/20 1612  Vitals shown include unvalidated device data.  Last Pain:  Vitals:   06/05/20 1044  TempSrc:   PainSc: 8       Patients Stated Pain Goal: 4 (02/56/15 4884)  Complications: No complications documented.

## 2020-06-05 NOTE — Plan of Care (Signed)
  Problem: Clinical Measurements: Goal: Cardiovascular complication will be avoided Outcome: Progressing   

## 2020-06-05 NOTE — Op Note (Signed)
PATIENT: Steven Cortez  DAY OF SURGERY: 06/05/20   PRE-OPERATIVE DIAGNOSIS:  Lumbar epidural infection   POST-OPERATIVE DIAGNOSIS: Lumbar epidural infection, phlegmon   PROCEDURE:  Repeat L4-5, L5-S1 MIS laminectomies, evacuation of epidural phlegmon   SURGEON:  Surgeon(s) and Role:    Jadene Pierini, MD - Primary   ANESTHESIA: ETGA   BRIEF HISTORY: This is a 45 year old man in whom I previously performed MIS laminectomies 2 weeks ago for a very large epidural abscess. Post-op, he did very well with significant improvement of his pain, return to ambulation and resolution of his neurologic deficits. He has been getting treated with antibiotics and managing his other medical problems in the meantime. However, he began to have severe worsening of his pain again with new numbness in the right lower extremity and was again non-ambulatory due to pain. An MRI showed recurrence of the abscess with recurrence of the stenosis. Given that he is otherwise responding to antibiotics, I offered repeat evacuation of the abscess with extension of the laminectomies to improve his symptoms and allow him to return to ambulation as well as prevent continued progression of his neurologic deficits. We discussed risks, benefits, and alternatives and the patient wished to proceed with surgery. Along with the usual risks of surgery, we did discuss that the antibiotics would ultimately have to control the infection to prevent continued recurrence.   OPERATIVE DETAIL: The patient was taken to the operating room and placed on the OR table in the prone position. A formal time out was performed with two patient identifiers and confirmed the operative site. Anesthesia was induced by the anesthesia team. The prior incision was marked, hair was clipped with surgical clippers, the area was then prepped and draped in a sterile fashion.   The prior left sided lumbar incision was opened incision was opened. Fingertip palpation  was used to palpate the L4-5 facet complex and fluoroscopy was used to guide a blunt dilator onto the left L4-5 facet complex. Serial dilators were then used and the level was confirmed with fluoroscopy followed by placement of a tubular [employer] retractor which was secured to the table. The microscope was draped and brought into the field. The subperiosteal dissection was expanded, the prior laminar defect was fully appreciated, and significant epidural phlegmon was present and adherent to the dura. This was removed where possible, but was densely adherent to the thecal sac and, as usual, quite vascular.   An MIS laminectomy was completed in the usual fashion. The ipsilateral lateral recess decompression was completed and ligamentum flavum was resected until the traversing nerve root was appreciated. The tube was then tilted medially and a combination of high speed drill, 90 degree, and curved kerrison rongeurs were used to complete the contralateral decompression and ligamentum flavum removal until reaching the contralateral foramen.  The tube was then removed under microscopic visualization to obtain and confirm hemostasis from deep to superficial. The inferior level was then palpated, serial dilators were used with fluoroscopy, and a tube was inserted and secured. The same technique was used as described above to appreciate and then expand the ipsilateral laminotomy, lateral recess decompression, then central canal, and contralateral lateral recess decompression until the contralateral foramen was reached. Hemostasis was similarly obtained while removing the tube under microscopic guidance.   All instrument and sponge counts were correct, the incision was then closed in layers. The patient was then returned to anesthesia for emergence. No apparent complications at the completion of the procedure.  EBL:  58mL   DRAINS: none   SPECIMENS: none   Jadene Pierini, MD 06/05/20 1:10 PM

## 2020-06-05 NOTE — Brief Op Note (Signed)
06/05/2020  3:59 PM  PATIENT:  Steven Cortez  45 y.o. male  PRE-OPERATIVE DIAGNOSIS:  epidural abscess  POST-OPERATIVE DIAGNOSIS:  epidural abscess  PROCEDURE:  Procedure(s): REPEAT LUMBAR FOUR-FIVE , LUMBAR FIVE- SACRAL ONE LUMBAR LAMINECTOMY/ DECOMPRESSION WITH MET-RX (N/A)  SURGEON:  Surgeon(s) and Role:    * Kaylib Furness, Joyice Faster, MD - Primary  PHYSICIAN ASSISTANT:   ASSISTANTS: none   ANESTHESIA:   general  EBL:  100 mL   BLOOD ADMINISTERED:none  DRAINS: none   LOCAL MEDICATIONS USED:  LIDOCAINE   SPECIMEN:  No Specimen  DISPOSITION OF SPECIMEN:  N/A  COUNTS:  YES  TOURNIQUET:  * No tourniquets in log *  DICTATION: .Note written in EPIC  PLAN OF CARE: Admit to inpatient   PATIENT DISPOSITION:  PACU - hemodynamically stable.   Delay start of Pharmacological VTE agent (>24hrs) due to surgical blood loss or risk of bleeding: yes

## 2020-06-05 NOTE — Progress Notes (Signed)
Chart check  Patient away for procedure    A/p mrsa sepsis with septic pulm emboli, epidural abscess vertebral om S/p laminectomy but repeat imaging worsening fluid collection  OR note mention epidural phlegmon, no frank pus   -continue dapto -cts to see next week in regard to angiovac/pfo closure -plan 8 weeks abx from 2/11  Dr Drue Second available this weekend for urgent question, otherwise new ID team on monday

## 2020-06-05 NOTE — Progress Notes (Signed)
PT Cancellation Note  Patient Details Name: Steven Cortez MRN: [id number] DOB: 1975/10/12   Cancelled Treatment:    Reason Eval/Treat Not Completed: (P) Patient at procedure or test/unavailable (pt off unit at OR for repeat evacuation and decompression per chart review.) Will continue efforts per PT POC as schedule permits.   Dorathy Kinsman Valor [place] 06/05/2020, 1:34 PM

## 2020-06-05 NOTE — Progress Notes (Addendum)
Subjective:  Patient reports continued pain in right hip and lower back, and persistence of the numbness along his right ankle. Endorses has been moving bowels and bladder without concern.   Objective:  Vital signs in last 24 hours: Vitals:   06/05/20 0400 06/05/20 0434 06/05/20 1044 06/05/20 1136  BP:  (!) 155/99 138/86   Pulse:  92    Resp:  18    Temp:  97.8 F (36.6 C)    TempSrc:  Oral    SpO2:  97% 97%   Weight: 82.6 kg   82.6 kg  Height:    5\' 10"  (1.778 m)   Weight change: -0.199 kg  Intake/Output Summary (Last 24 hours) at 06/05/2020 1301 Last data filed at 06/05/2020 1123 Gross per 24 hour  Intake 654 ml  Output 1075 ml  Net -421 ml     Physical Exam Constitutional:      General: He is not in acute distress.    Appearance: He is not toxic-appearing or diaphoretic.     Comments: Patient seen lying in bed, NAD.   Cardiovascular:     Rate and Rhythm: Normal rate and regular rhythm.  Pulmonary:     Effort: Pulmonary effort is normal. No respiratory distress.  Musculoskeletal:     Right lower leg: Edema present.     Left lower leg: Edema present.     Comments: Symmetric 1-2+ pitting edema to ankle.   Neurological:     Mental Status: Mental status is at baseline.     Comments: Moves extremities spontaneously. Sensation intact.        Assessment/Plan:  Principal Problem:   AKI (acute kidney injury) (HCC) Active Problems:   Endocarditis   Urinary retention   Hyperkalemia   Microcytic anemia   MRSA infection   Epidural abscess   Opioid use disorder   Hydronephrosis   Malnutrition of moderate degree   Steven Cortez is a 45 y.o. male with hx of IV drug use and untreated Hep C who was diagnosed with endocarditis and MRSA bacteremia at [facility], left AMA then presented to [facility] a few days later. Also found to have large epidural abscess, s/p laminectomy and evacuation by neurosurgery on 1/29. Initially improving, now with progressive  back pain and LE weakness.    MRSA Tricuspid Endocarditis  MRSA Bacteremia  Complicated by septic pulmonary emboli and lumbar epidural abscess s/p I&D with laminectomy 1/29 -Evaluated by Dr. 2/29 2/4 for possible angiovac however is high risk due to PFO. Would need a balloon occlusion of the PFO if he were to do the angiovac (would be multidisciplinary with Interventional Cardiology assistance).  -blood cultures negative to date -Repeat Lumbar MRI showed persistent osteomyelitis and additional paraspinous collections along the anterior margin at levels L4-5 and L5-S1, now more organized as compared to previous. Additionally showed potentially increased significant right-sided hydronephrosis. F/u Renal Cliffton Asters congruent. Bladder scan appropriate. Had been having good UOP, poor output yesterday.  CT renal study showed new mild bilateral hydronephrosis without noted obstructing foci; notable for widespread anasarca. No intramuscular lesions noted. -Patient continues to have severe back and hip pain but improved on increased pain medication. Continues with numbness at right ankle   Plan -Neurosurgery following: repeat evacuation + expansion of laminotomies today -continue daptomycin to complete 8w treatment; will restart the clock 2/11 in light of repeat lumbar procedure. -last CK normal on 2/7 -pain management: Oxycodone15mg  q4h, PRN dilaudid 0.5mg  daily 20-30 mins before PT/OT sessions -Appreciate ID rec:  will need repeat mri spine in 4-5 weeks starting 06/02/2020 (around 2nd week march 2022) -PRN Albuterol    Acute renal failure; Likely multifactorial with acute urinary retention 2/2 to neurogenic bladder, ATN vs. Renal infarction from septic emboli vs. Infection associated GN. Hyperkalemia Hypervolemia Received HD on 1/29 and 2/1. HD line removed 2/4. UOP appears to be decreasing, 1300 > 675 > 500 cc today. Unfortunately he has developed hyperkalemia and is now hypervolemic with LE edema and  evidence of anasarca on imaging.  Received lokelma yesterday, persistent mild hyperkalemia today. Will continue to monitor with strict I/Os. Creatinine also increasing.   -Reconsulted nephrology who declined to see -Continue monitoring strict I&O -Consider starting Lasix if progresses -Lokelma 10g x1, monitor for improvement    Anemia of chronic disease, Anemia of critical illness. Some drop in Hgb to 6.8 on 2/9, stable following receiving 1 unit pRBC -continue to monitor     Hypertension. Appropriately controlled with improved pain control. On Amlodipine 10 and Coreg continue to monitor     Opioid use disorder He has been on methadone in the past but would be interested in treatment with suboxone. Will readdress this prior to discharge. Continue oxycodone prn for now.    LOS: 15 days   [name], Medical Student 06/05/2020, 1:01 PM

## 2020-06-05 NOTE — Anesthesia Procedure Notes (Addendum)
Procedure Name: Intubation Date/Time: 06/05/2020 1:31 PM Performed by: Dorthea Cove, CRNA Pre-anesthesia Checklist: Patient identified, Emergency Drugs available, Suction available and Patient being monitored Patient Re-evaluated:Patient Re-evaluated prior to induction Oxygen Delivery Method: Circle system utilized Preoxygenation: Pre-oxygenation with 100% oxygen Induction Type: IV induction Ventilation: Mask ventilation without difficulty Laryngoscope Size: Mac and 4 Grade View: Grade I Tube type: Oral Tube size: 7.5 mm Number of attempts: 1 Airway Equipment and Method: Stylet and Oral airway Placement Confirmation: ETT inserted through vocal cords under direct vision,  positive ETCO2 and breath sounds checked- equal and bilateral Secured at: 22 cm Tube secured with: Tape Dental Injury: Teeth and Oropharynx as per pre-operative assessment  Comments: Pt. Intubated by SRNA without incident. After turn to prone, ETCO2 lost. Returned to supine. [place], MD at bedside to ensure proper tube position with glidescope.

## 2020-06-06 DIAGNOSIS — N1330 Unspecified hydronephrosis: Secondary | ICD-10-CM

## 2020-06-06 DIAGNOSIS — I10 Essential (primary) hypertension: Secondary | ICD-10-CM

## 2020-06-06 LAB — BASIC METABOLIC PANEL
Anion gap: 11 (ref 5–15)
BUN: 88 mg/dL — ABNORMAL HIGH (ref 6–20)
CO2: 24 mmol/L (ref 22–32)
Calcium: 7.7 mg/dL — ABNORMAL LOW (ref 8.9–10.3)
Chloride: 106 mmol/L (ref 98–111)
Creatinine, Ser: 2.42 mg/dL — ABNORMAL HIGH (ref 0.61–1.24)
GFR, Estimated: 33 mL/min — ABNORMAL LOW (ref >60)
Glucose, Bld: 139 mg/dL — ABNORMAL HIGH (ref 70–99)
Potassium: 5 mmol/L (ref 3.5–5.1)
Sodium: 141 mmol/L (ref 135–145)

## 2020-06-06 LAB — CBC
HCT: 23.1 % — ABNORMAL LOW (ref 39.0–52.0)
Hemoglobin: 7.2 g/dL — ABNORMAL LOW (ref 13.0–17.0)
MCH: 28.2 pg (ref 26.0–34.0)
MCHC: 31.2 g/dL (ref 30.0–36.0)
MCV: 90.6 fL (ref 80.0–100.0)
Platelets: 228 10*3/uL (ref 150–400)
RBC: 2.55 MIL/uL — ABNORMAL LOW (ref 4.22–5.81)
RDW: 16.2 % — ABNORMAL HIGH (ref 11.5–15.5)
WBC: 7 10*3/uL (ref 4.0–10.5)
nRBC: 0 % (ref 0.0–0.2)

## 2020-06-06 LAB — RENAL FUNCTION PANEL
Albumin: 1.7 g/dL — ABNORMAL LOW (ref 3.5–5.0)
Anion gap: 11 (ref 5–15)
BUN: 84 mg/dL — ABNORMAL HIGH (ref 6–20)
CO2: 23 mmol/L (ref 22–32)
Calcium: 7.8 mg/dL — ABNORMAL LOW (ref 8.9–10.3)
Chloride: 105 mmol/L (ref 98–111)
Creatinine, Ser: 2.5 mg/dL — ABNORMAL HIGH (ref 0.61–1.24)
GFR, Estimated: 32 mL/min — ABNORMAL LOW (ref >60)
Glucose, Bld: 165 mg/dL — ABNORMAL HIGH (ref 70–99)
Phosphorus: 6.7 mg/dL — ABNORMAL HIGH (ref 2.5–4.6)
Potassium: 6 mmol/L — ABNORMAL HIGH (ref 3.5–5.1)
Sodium: 139 mmol/L (ref 135–145)

## 2020-06-06 MED ORDER — ALBUMIN HUMAN 25 % IV SOLN
25.0000 g | Freq: Once | INTRAVENOUS | Status: AC
  Administered 2020-06-06: 25 g via INTRAVENOUS
  Filled 2020-06-06: qty 100

## 2020-06-06 MED ORDER — CYCLOBENZAPRINE HCL 5 MG PO TABS
5.0000 mg | ORAL_TABLET | Freq: Once | ORAL | Status: AC
  Administered 2020-06-06: 5 mg via ORAL
  Filled 2020-06-06: qty 1

## 2020-06-06 MED ORDER — SODIUM ZIRCONIUM CYCLOSILICATE 10 G PO PACK
10.0000 g | PACK | Freq: Two times a day (BID) | ORAL | Status: DC
  Administered 2020-06-06 – 2020-06-07 (×3): 10 g via ORAL
  Filled 2020-06-06 (×2): qty 1

## 2020-06-06 MED ORDER — SODIUM CHLORIDE 0.9 % IV BOLUS
500.0000 mL | Freq: Once | INTRAVENOUS | Status: AC
  Administered 2020-06-06: 500 mL via INTRAVENOUS

## 2020-06-06 MED ORDER — SODIUM ZIRCONIUM CYCLOSILICATE 10 G PO PACK
10.0000 g | PACK | Freq: Once | ORAL | Status: AC
  Administered 2020-06-06: 10 g via ORAL
  Filled 2020-06-06: qty 1

## 2020-06-06 NOTE — Progress Notes (Signed)
Subjective: Patient reports feeling better this morning  Objective: Vital signs in last 24 hours: Temp:  [97.2 F (36.2 C)-97.9 F (36.6 C)] 97.8 F (36.6 C) (02/12 0431) Pulse Rate:  [69-92] 70 (02/12 0431) Resp:  [10-20] 20 (02/12 0431) BP: (110-138)/(61-86) 114/73 (02/12 0431) SpO2:  [95 %-99 %] 97 % (02/12 0431) Weight:  [82.6 kg-85.1 kg] 85.1 kg (02/12 0431)  Intake/Output from previous day: 02/11 0701 - 02/12 0700 In: 990 [P.O.:240; I.V.:500; IV Piggyback:250] Out: 950 [Urine:850; Blood:100] Intake/Output this shift: No intake/output data recorded.  Physical Exam: Patient is up and ambulating.  Incision CDI  Lab Results: Recent Labs    06/04/20 0433 06/05/20 0933  WBC 7.8 6.4  HGB 7.9* 7.6*  HCT 24.8* 24.3*  PLT 153 190   BMET Recent Labs    06/05/20 0501 06/06/20 0545  NA 137 139  K 5.3* 6.0*  CL 103 105  CO2 25 23  GLUCOSE 115* 165*  BUN 65* 84*  CREATININE 1.95* 2.50*  CALCIUM 8.2* 7.8*    Studies/Results: DG Lumbar Spine 2-3 Views  Result Date: 06/05/2020 CLINICAL DATA:  Surgery, elective. Additional history provided: Laminectomy L4-S1. Provided fluoroscopy time 10 seconds. EXAM: LUMBAR SPINE - 2-3 VIEW; DG C-ARM 1-60 MIN COMPARISON:  Lumbar spine MRI 06/02/2020. FINDINGS: Two lateral view intraoperative fluoroscopic images of the lumbar spine are submitted. The images demonstrate surgical instruments projecting posterior to the L4-L5 and L5-S1 levels, and an additional instrument projecting over the sacrum. Correlate with procedural history. IMPRESSION: Two intraoperative fluoroscopic images of the lumbar spine, as described. Electronically Signed   By: Jackey Loge DO   On: 06/05/2020 16:32   DG C-Arm 1-60 Min  Result Date: 06/05/2020 CLINICAL DATA:  Surgery, elective. Additional history provided: Laminectomy L4-S1. Provided fluoroscopy time 10 seconds. EXAM: LUMBAR SPINE - 2-3 VIEW; DG C-ARM 1-60 MIN COMPARISON:  Lumbar spine MRI 06/02/2020.  FINDINGS: Two lateral view intraoperative fluoroscopic images of the lumbar spine are submitted. The images demonstrate surgical instruments projecting posterior to the L4-L5 and L5-S1 levels, and an additional instrument projecting over the sacrum. Correlate with procedural history. IMPRESSION: Two intraoperative fluoroscopic images of the lumbar spine, as described. Electronically Signed   By: Jackey Loge DO   On: 06/05/2020 16:32   CT RENAL STONE STUDY  Result Date: 06/05/2020 CLINICAL DATA:  Renal failure EXAM: CT ABDOMEN AND PELVIS WITHOUT CONTRAST TECHNIQUE: Multidetector CT imaging of the abdomen and pelvis was performed following the standard protocol without oral or IV contrast. COMPARISON:  May 18, 2020. FINDINGS: Lower chest: There are again noted multiple cavitary lesions throughout the visualized mid and lower lung regions with consolidation in a portion of the right lower lobe. This appearance is similar to prior study. There is a left pleural effusion with a small right pleural effusion, slightly larger compared to most recent study. Hepatobiliary: No focal liver lesions are appreciable on this noncontrast enhanced study. Gallbladder wall is not appreciably thickened. There is no biliary duct dilatation. Pancreas: No evident pancreatic mass or inflammatory focus. Spleen: Splenic prominence is again noted. No focal splenic lesions are evident on this noncontrast enhanced study. Adrenals/Urinary Tract: Adrenals appear unremarkable bilaterally. There is no appreciable renal mass on either side. In comparison with previous study, there is now mild hydronephrosis on either side, slightly more on the right than on the left. No renal or ureteral calculus is evident. Urinary bladder wall is not appreciably thickened. There is moderate urine in the bladder currently. Stomach/Bowel: There is  moderate stool throughout the colon. There is no appreciable bowel wall thickening. There is mild mesenteric  thickening, similar to prior study. There is no evident bowel obstruction. Terminal ileum appears normal. There is no evident free air or portal venous air. Vascular/Lymphatic: No abdominal aortic aneurysm. There are scattered foci of aorta and common iliac artery atherosclerotic calcification. There is no evident adenopathy by size criteria in the abdomen or pelvis. Reproductive: Prostate and seminal vesicles are normal in size and contour. Other: Appendix region appears unremarkable without inflammation. No abscess evident in the abdomen pelvis. There is mild ascites in the dependent portion of the pelvis. There is diffuse soft tissue edema throughout the abdomen and pelvic wall regions. Musculoskeletal: There is degenerative change in the lower lumbar region. There is subtle endplate irregularity at L4-5 and L5-S1 which is subtly more notable than on previous study. No blastic or lytic bone lesions are evident. There are no intramuscular lesions. IMPRESSION: 1. Extensive nodular opacities throughout the visualized lungs with consolidation again noted in the right lower lobe. Multiple areas of cavitation evident. Appearance consistent with combination of pneumonia and septic emboli. Small pleural effusions bilaterally noted. 2. Mild hydronephrosis is now present on each side, a finding not present on most recent prior study. No obstructing focus seen in either ureter. Urinary bladder contains moderate urine is not grossly distended. Question developing pyelonephritis on each side given this appearance. 3. Widespread anasarca again noted. Mild mesenteric edema also noted in the abdomen and pelvis, stable. Mild ascites. 4. Subtle endplate irregularity at L4-5 and L5-S1. Underlying degenerative type change in these areas. The earliest changes of discitis could present in this manner. MR would be the optimum study of choice to further evaluate if discitis is of concern clinically. 5.  Aortic Atherosclerosis  (ICD10-I70.0). 6.  Stable splenic prominence. Electronically Signed   By: Bretta Bang III M.D.   On: 06/05/2020 09:39    Assessment/Plan: Patient is feeling better this am.  Continue IVABX.    LOS: 16 days    Dorian Heckle, MD 06/06/2020, 8:46 AM

## 2020-06-06 NOTE — Progress Notes (Addendum)
Subjective:  Patient reports doing better. Back and hip pain improving. Breathing well. Reports better able to sleep overnight. He reports has urinated twice this morning thus far.    Objective:  Vital signs in last 24 hours: Vitals:   06/05/20 2330 06/06/20 0431 06/06/20 0848 06/06/20 0851  BP: 118/75 114/73 114/74 114/74  Pulse: 69 70 76 76  Resp: 18 20  18   Temp: 97.8 F (36.6 C) 97.8 F (36.6 C)  97.7 F (36.5 C)  TempSrc: Oral Oral  Oral  SpO2: 97% 97%  99%  Weight:  85.1 kg    Height:       Weight change: 0.454 kg  Intake/Output Summary (Last 24 hours) at 06/06/2020 1421 Last data filed at 06/06/2020 1200 Gross per 24 hour  Intake 1000 ml  Output 1400 ml  Net -400 ml    Physical Exam Constitutional:      General: He is not in acute distress.    Appearance: He is not toxic-appearing or diaphoretic.     Comments: Patient seen sitting  in chair. NAD.   Cardiovascular:     Rate and Rhythm: Normal rate and regular rhythm.  Pulmonary:     Effort: Pulmonary effort is normal. No respiratory distress.  Musculoskeletal:     Comments: 1-2+ pitting edema to just above ankle  Neurological:     Mental Status: He is alert.  Psychiatric:        Mood and Affect: Mood normal.        Behavior: Behavior normal.      Assessment/Plan:  Principal Problem:   AKI (acute kidney injury) (HCC) Active Problems:   Endocarditis   Urinary retention   Hyperkalemia   Microcytic anemia   MRSA infection   Epidural abscess   Opioid use disorder   Hydronephrosis   Malnutrition of moderate degree   Antibiotic long-term use   Steven Houchinsis a 45 y.o.malewith hx of IV drug use and untreated Hep Cwho was diagnosed with endocarditis and MRSA bacteremia at [facility], left AMA then presented to [facility] a few days later. Also found to have large epidural abscess,s/p laminectomy and evacuation by neurosurgery on1/29. Initially improved, then some progressive back pain  and LE weakness.Patient now s/p repeat laminectomy on 2/11. Pain improving.    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 Plan Appreciate ID rec:  -continue daptomycin to complete 8w treatment; restarted the clock 2/11 in light of repeat lumbar procedure.  -will need repeat mri spine in 4-5 weeks starting 06/02/2020 (around 2nd week march 2022) -Appreciate Neurosurgery    -activity as tolerated   -okay for DVT prophylaxis 2/13 -pain management:Oxycodone15mg  q4h, PRN dilaudid 0.5mg  q 3 hrs, PRN dilaudid 0.5mg  daily 20-30 mins before PT/OT sessions  Acute renal failure. Now off HD. Renal function declining over last 2-3d. No indication to resume HD at this time. Due to worsening pain and poor oral intake over the past few days, question if he is not intravascularly depleted causing pre-renal injury. Low albumin may be contributing to LE edema Discussed with nephrology 2/12 Bilateral Hydronephrosis. CT renal study negative for stone. Post void bladder scan 10mL. Would ideally like to place a foley cath for better monitoring however staff was unsuccessful last evening due to phimosis. 11m  10g BID -25% Albumin 25g -500cc NS bolus -d/c amlodipine for now to allow for renal perfusion -renal diet -post void bladder scans q shift -Consider starting Lasix if progresses -strict I&O -Trend BMP -f/u evening BMP   Anemia of chronic disease, Anemia of critical illness. Follow up CBC  Hypertension.normotensive on coreg and amlodipine. Will hold amlodipine to allow for better renal perfusion per nephrology.  Opioid use disorder. Will need further discussion regarding  suboxone treatment in the future.      LOS: 16 days   [name], Medical Student 06/06/2020, 2:21 PM   Attestation for Student Documentation:  I personally was present and performed or re-performed the history, physical exam and medical decision-making activities of this service and have verified that the service and findings are accurately documented in the student's note.  Elige Radon, MD Internal Medicine Resident PGY-2 [facility] Pager: [phone] 06/06/2020 5:25 PM

## 2020-06-06 NOTE — Progress Notes (Signed)
Patient has order for foley catheter.Patient verbalized previous catheter was difficult to place.When chart checked catheter was placed by urology.Dr.Bloomfield and Dr.Speakman notified of this.Per Dr.Speakman will consult urology for foley placement.Patient has not voided since returning from surgery Dr.Speakman aware.

## 2020-06-07 LAB — RENAL FUNCTION PANEL
Albumin: 2.1 g/dL — ABNORMAL LOW (ref 3.5–5.0)
Anion gap: 10 (ref 5–15)
BUN: 84 mg/dL — ABNORMAL HIGH (ref 6–20)
CO2: 24 mmol/L (ref 22–32)
Calcium: 7.8 mg/dL — ABNORMAL LOW (ref 8.9–10.3)
Chloride: 106 mmol/L (ref 98–111)
Creatinine, Ser: 1.9 mg/dL — ABNORMAL HIGH (ref 0.61–1.24)
GFR, Estimated: 44 mL/min — ABNORMAL LOW (ref >60)
Glucose, Bld: 95 mg/dL (ref 70–99)
Phosphorus: 3.9 mg/dL (ref 2.5–4.6)
Potassium: 4.5 mmol/L (ref 3.5–5.1)
Sodium: 140 mmol/L (ref 135–145)

## 2020-06-07 LAB — CBC
HCT: 23.3 % — ABNORMAL LOW (ref 39.0–52.0)
Hemoglobin: 7.3 g/dL — ABNORMAL LOW (ref 13.0–17.0)
MCH: 28.6 pg (ref 26.0–34.0)
MCHC: 31.3 g/dL (ref 30.0–36.0)
MCV: 91.4 fL (ref 80.0–100.0)
Platelets: 216 10*3/uL (ref 150–400)
RBC: 2.55 MIL/uL — ABNORMAL LOW (ref 4.22–5.81)
RDW: 16.2 % — ABNORMAL HIGH (ref 11.5–15.5)
WBC: 7.2 10*3/uL (ref 4.0–10.5)
nRBC: 0 % (ref 0.0–0.2)

## 2020-06-07 MED ORDER — CYCLOBENZAPRINE HCL 5 MG PO TABS
5.0000 mg | ORAL_TABLET | Freq: Once | ORAL | Status: AC
  Administered 2020-06-07: 5 mg via ORAL
  Filled 2020-06-07: qty 1

## 2020-06-07 MED ORDER — HYDROMORPHONE HCL 1 MG/ML IJ SOLN
1.0000 mg | INTRAMUSCULAR | Status: AC | PRN
  Administered 2020-06-07 (×2): 1 mg via INTRAVENOUS
  Filled 2020-06-07 (×2): qty 1

## 2020-06-07 MED ORDER — ACETAMINOPHEN 325 MG PO TABS
650.0000 mg | ORAL_TABLET | Freq: Four times a day (QID) | ORAL | Status: DC
  Administered 2020-06-07 – 2020-06-12 (×20): 650 mg via ORAL
  Filled 2020-06-07 (×21): qty 2

## 2020-06-07 MED ORDER — AMLODIPINE BESYLATE 5 MG PO TABS
5.0000 mg | ORAL_TABLET | Freq: Every day | ORAL | Status: DC
  Administered 2020-06-07 – 2020-06-10 (×4): 5 mg via ORAL
  Filled 2020-06-07 (×4): qty 1

## 2020-06-07 MED ORDER — METHOCARBAMOL 500 MG PO TABS
750.0000 mg | ORAL_TABLET | Freq: Three times a day (TID) | ORAL | Status: DC
  Administered 2020-06-07 – 2020-06-28 (×82): 750 mg via ORAL
  Filled 2020-06-07 (×84): qty 2

## 2020-06-07 MED ORDER — SODIUM CHLORIDE 0.9 % IV BOLUS
500.0000 mL | Freq: Once | INTRAVENOUS | Status: AC
  Administered 2020-06-07: 500 mL via INTRAVENOUS

## 2020-06-07 MED ORDER — ALBUMIN HUMAN 25 % IV SOLN
25.0000 g | Freq: Once | INTRAVENOUS | Status: AC
  Administered 2020-06-07: 25 g via INTRAVENOUS
  Filled 2020-06-07: qty 100

## 2020-06-07 NOTE — Progress Notes (Signed)
  NEUROSURGERY PROGRESS NOTE   No issues overnight.  Complains of severe muscle spasms No new N/T/W  EXAM:  BP (!) 147/100 (BP Location: Left Arm)   Pulse 91   Temp 98.2 F (36.8 C) (Oral)   Resp 18   Ht 5\' 10"  (1.778 m)   Wt 85.4 kg   SpO2 100%   BMI 27.01 kg/m   Awake, alert, oriented  Speech fluent, appropriate  CN grossly intact  Stable motor   PLAN Stable neurologically Will add robaxin for muscle spasms Continue abx

## 2020-06-07 NOTE — Progress Notes (Signed)
Subjective:  Overnight team paged for severe muscle spasms in his back. 5mg  flexeril given. On entering his room this morning, he appeared to be in quite a bit of pain. He notes that he developed significantly worse pain overnight that feels like muscle spasms. The pain is primarily in his lower back but also radiates around his flanks and to his lateral abdomen, down to his groin.  He denies neurologic changes in the LE including numbness, tingling or weakness. The flexeril did not help overnight. He reports urinating well   Objective:  Vital signs in last 24 hours: Vitals:   06/06/20 2030 06/07/20 0103 06/07/20 0300 06/07/20 0348  BP: 129/85 (!) 141/84  (!) 156/92  Pulse: 89 87  88  Resp: 18 19  19   Temp: 98 F (36.7 C) 98.4 F (36.9 C)  98 F (36.7 C)  TempSrc: Oral Oral  Oral  SpO2: 100% 99%  100%  Weight:   85.4 kg   Height:       Weight change: 2.845 kg  Intake/Output Summary (Last 24 hours) at 06/07/2020 0555 Last data filed at 06/07/2020 0500 Gross per 24 hour  Intake 1630.84 ml  Output 2100 ml  Net -469.16 ml   Exam General: acutely ill appearing in distress Cardiac: tachycardic rate, regular rhythm Pulm: breathing comfortably on room air. Lungs clear Neuro: moves LE. Sensation to LE intact.  Skin: clean surgical incision over the lumbar spine without drainage. GI: pain with palpation to the lower quadrants  Assessment/Plan:  Principal Problem:   AKI (acute kidney injury) (HCC) Active Problems:   Endocarditis   Urinary retention   Hyperkalemia   Microcytic anemia   MRSA infection   Epidural abscess   Opioid use disorder   Hydronephrosis   Malnutrition of moderate degree   Antibiotic long-term use   Steven Houchinsis a 44 y.o.malewith hx of IV drug use and untreated Hep Cwho was diagnosed with endocarditis and MRSA bacteremia at [facility], left AMA then presented to [facility] a few days later. Also found to have large epidural  abscess,s/p laminectomy and evacuation by neurosurgery on1/29. Initially improved, then some progressive back pain and LE weakness.Patient now s/p repeat laminectomy on 2/11.    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 Plan -continue daptomycin to complete 8 week course (2/11-4/8) -appreciate neurosurgery -pain management: robaxin 750mg  TID, oxycodone 15mg  q4h prn, dilaudid 1mg  q3h prn, tylenol q6h  -will call neurosurgery if any acute neurological changes concerning for compression develop  Acute renal failure. Now off HD. Renal function improved overnight after receiving the IVF and albumin yesterday which suggests that his worsening renal function was primarily pre-renal which is likely from poor oral intake. Decline in renal function correlates with decreased UOP based on documentation He had great UOP yesterday K stable at 4.5.  Bilateral Hydronephrosis.  Post void bladder scan 33mL so I don't think this is driven by retention. His pain, this morning, is suspicious for renal colic however no nephrolithiasis on recent CT Plan -Lokelma 10g BID. Can discontinue in the upcoming days if renal function continues to improve -will give another dose of albumin and NS bolus -renal diet -post void bladder scans q shift -urology consulted  Anemia of chronic disease, Post op anemia. Stable. Continue  to monitor  Hypertension. Blood pressures are up a little today due to pain but still within goal. Continue amlodipine and coreg  Opioid use disorder. Will need further discussion regarding suboxone treatment in the future.    Steven Radon, MD Internal Medicine  Resident PGY-2 [facility] Pager: [phone] 06/07/2020 5:55 AM

## 2020-06-07 NOTE — Progress Notes (Signed)
   06/07/20 1531  Assess: MEWS Score  Temp 97.6 F (36.4 C)  BP (!) 174/104  Pulse Rate 91  Resp (!) 26  Level of Consciousness Alert  SpO2 99 %  O2 Device Room Air  Assess: MEWS Score  MEWS Temp 0  MEWS Systolic 0  MEWS Pulse 0  MEWS RR 2  MEWS LOC 0  MEWS Score 2  MEWS Score Color Yellow  Assess: if the MEWS score is Yellow or Red  Were vital signs taken at a resting state? Yes  Focused Assessment Change from prior assessment (see assessment flowsheet)  Early Detection of Sepsis Score *See Row Information* Low  MEWS guidelines implemented *See Row Information* Yes  Treat  MEWS Interventions Administered scheduled meds/treatments;Administered prn meds/treatments  Pain Scale 0-10  Pain Score 9  Pain Type Surgical pain  Pain Location Back  Pain Intervention(s) Medication (See eMAR);Repositioned  Take Vital Signs  Increase Vital Sign Frequency  Yellow: Q 2hr X 2 then Q 4hr X 2, if remains yellow, continue Q 4hrs  Document  Progress note created (see row info) Yes   Patient with elevated HR and RR. Pain continues to be difficult to control. Administered PRN and scheduled medications. Will reassess vitals in 2 hours

## 2020-06-07 NOTE — Progress Notes (Signed)
2 Days Post-Op Subjective: Called to assess chronic bilateral hydronephrosis noted on CT from 06/05/20.  Hydro also seen on RUS x2 several weeks ago.  No complaints of flank pain, nausea/vomiting or difficulty voiding.  PVR-4 mL.  UOP has bee more than adequate for the past several days.  Creatinine stable. Afebrile.  WBC normal.   Objective: Vital signs in last 24 hours: Temp:  [97.8 F (36.6 C)-98.4 F (36.9 C)] 97.9 F (36.6 C) (02/13 1138) Pulse Rate:  [87-98] 98 (02/13 1138) Resp:  [17-19] 17 (02/13 0851) BP: (129-165)/(84-100) 157/98 (02/13 1138) SpO2:  [99 %-100 %] 99 % (02/13 0851) Weight:  [85.4 kg] 85.4 kg (02/13 0300)  Intake/Output from previous day: 02/12 0701 - 02/13 0700 In: 1630.8 [P.O.:1080; I.V.:22; IV Piggyback:528.8] Out: 2100 [Urine:2100]  Intake/Output this shift: Total I/O In: 480 [P.O.:480] Out: 1350 [Urine:1350]  Physical Exam:  General: Alert and oriented CV: RRR, palpable distal pulses Lungs: CTAB, equal chest rise Abdomen: Soft, NTND, no rebound or guarding  Lab Results: Recent Labs    06/05/20 0933 06/06/20 2007 06/07/20 0500  HGB 7.6* 7.2* 7.3*  HCT 24.3* 23.1* 23.3*   BMET Recent Labs    06/06/20 1716 06/07/20 0500  NA 141 140  K 5.0 4.5  CL 106 106  CO2 24 24  GLUCOSE 139* 95  BUN 88* 84*  CREATININE 2.42* 1.90*  CALCIUM 7.7* 7.8*     Studies/Results: CLINICAL DATA:  Renal failure  EXAM: CT ABDOMEN AND PELVIS WITHOUT CONTRAST  TECHNIQUE: Multidetector CT imaging of the abdomen and pelvis was performed following the standard protocol without oral or IV contrast.  COMPARISON:  May 18, 2020.  FINDINGS: Lower chest: There are again noted multiple cavitary lesions throughout the visualized mid and lower lung regions with consolidation in a portion of the right lower lobe. This appearance is similar to prior study. There is a left pleural effusion with a small right pleural effusion, slightly larger compared to  most recent study.  Hepatobiliary: No focal liver lesions are appreciable on this noncontrast enhanced study. Gallbladder wall is not appreciably thickened. There is no biliary duct dilatation.  Pancreas: No evident pancreatic mass or inflammatory focus.  Spleen: Splenic prominence is again noted. No focal splenic lesions are evident on this noncontrast enhanced study.  Adrenals/Urinary Tract: Adrenals appear unremarkable bilaterally. There is no appreciable renal mass on either side. In comparison with previous study, there is now mild hydronephrosis on either side, slightly more on the right than on the left. No renal or ureteral calculus is evident. Urinary bladder wall is not appreciably thickened. There is moderate urine in the bladder currently.  Stomach/Bowel: There is moderate stool throughout the colon. There is no appreciable bowel wall thickening. There is mild mesenteric thickening, similar to prior study. There is no evident bowel obstruction. Terminal ileum appears normal. There is no evident free air or portal venous air.  Vascular/Lymphatic: No abdominal aortic aneurysm. There are scattered foci of aorta and common iliac artery atherosclerotic calcification. There is no evident adenopathy by size criteria in the abdomen or pelvis.  Reproductive: Prostate and seminal vesicles are normal in size and contour.  Other: Appendix region appears unremarkable without inflammation. No abscess evident in the abdomen pelvis. There is mild ascites in the dependent portion of the pelvis. There is diffuse soft tissue edema throughout the abdomen and pelvic wall regions.  Musculoskeletal: There is degenerative change in the lower lumbar region. There is subtle endplate irregularity at L4-5 and L5-S1  which is subtly more notable than on previous study. No blastic or lytic bone lesions are evident. There are no intramuscular lesions.  IMPRESSION: 1. Extensive  nodular opacities throughout the visualized lungs with consolidation again noted in the right lower lobe. Multiple areas of cavitation evident. Appearance consistent with combination of pneumonia and septic emboli. Small pleural effusions bilaterally noted.  2. Mild hydronephrosis is now present on each side, a finding not present on most recent prior study. No obstructing focus seen in either ureter. Urinary bladder contains moderate urine is not grossly distended. Question developing pyelonephritis on each side given this appearance.  3. Widespread anasarca again noted. Mild mesenteric edema also noted in the abdomen and pelvis, stable. Mild ascites.  4. Subtle endplate irregularity at L4-5 and L5-S1. Underlying degenerative type change in these areas. The earliest changes of discitis could present in this manner. MR would be the optimum study of choice to further evaluate if discitis is of concern clinically.  5.  Aortic Atherosclerosis (ICD10-I70.0).  6.  Stable splenic prominence.   Electronically Signed   By: Bretta Bang III M.D.   On: 06/05/2020 09:39 Assessment/Plan: 45 year old male with multiple medical coormorbidities with mild, chronic bilateral hydonephrosis and resolving ARF.  -Bilateral hydronephrosis is likely a chronic process, given that it has been present for the past several days and his renal function has progressively improved with IVF.  He is voiding adequately with minimal PVR so a post-renal source is unlikely.  His back pain is likely 2/2 his recent back surgeries and not from renal colic.   -Continue to monitor renal function and urine output. -Encouraged PO intake.  IVF per primary team -Call back as needed.    LOS: 17 days   Rhoderick Moody, MD [facility] Pager: [phone]  06/07/2020, 1:31 PM

## 2020-06-07 NOTE — Plan of Care (Signed)
  Problem: Coping: Goal: Level of anxiety will decrease Outcome: Progressing   Problem: Pain Managment: Goal: General experience of comfort will improve Outcome: Not Progressing   

## 2020-06-07 NOTE — Progress Notes (Signed)
Patient complaining of increased spasms in back and swelling in his legs. Upon assessment of surgical incision patient bed and gown have some blood tinged drainage and also has a steady trickle of fluid from incision site. Gauze dressing applied and neurosurgery made aware. Will change dressing as soiled

## 2020-06-08 ENCOUNTER — Encounter (HOSPITAL_COMMUNITY): Payer: Self-pay | Admitting: Neurological Surgery

## 2020-06-08 DIAGNOSIS — Z792 Long term (current) use of antibiotics: Secondary | ICD-10-CM

## 2020-06-08 DIAGNOSIS — R7881 Bacteremia: Secondary | ICD-10-CM

## 2020-06-08 DIAGNOSIS — B9562 Methicillin resistant Staphylococcus aureus infection as the cause of diseases classified elsewhere: Secondary | ICD-10-CM

## 2020-06-08 LAB — CBC
HCT: 23.6 % — ABNORMAL LOW (ref 39.0–52.0)
Hemoglobin: 7.2 g/dL — ABNORMAL LOW (ref 13.0–17.0)
MCH: 27.6 pg (ref 26.0–34.0)
MCHC: 30.5 g/dL (ref 30.0–36.0)
MCV: 90.4 fL (ref 80.0–100.0)
Platelets: 252 10*3/uL (ref 150–400)
RBC: 2.61 MIL/uL — ABNORMAL LOW (ref 4.22–5.81)
RDW: 16.1 % — ABNORMAL HIGH (ref 11.5–15.5)
WBC: 6.8 10*3/uL (ref 4.0–10.5)
nRBC: 0 % (ref 0.0–0.2)

## 2020-06-08 LAB — RENAL FUNCTION PANEL
Albumin: 2.2 g/dL — ABNORMAL LOW (ref 3.5–5.0)
Anion gap: 11 (ref 5–15)
BUN: 62 mg/dL — ABNORMAL HIGH (ref 6–20)
CO2: 23 mmol/L (ref 22–32)
Calcium: 8.2 mg/dL — ABNORMAL LOW (ref 8.9–10.3)
Chloride: 106 mmol/L (ref 98–111)
Creatinine, Ser: 1.53 mg/dL — ABNORMAL HIGH (ref 0.61–1.24)
GFR, Estimated: 57 mL/min — ABNORMAL LOW (ref >60)
Glucose, Bld: 116 mg/dL — ABNORMAL HIGH (ref 70–99)
Phosphorus: 4.4 mg/dL (ref 2.5–4.6)
Potassium: 4 mmol/L (ref 3.5–5.1)
Sodium: 140 mmol/L (ref 135–145)

## 2020-06-08 LAB — CK: Total CK: 82 U/L (ref 49–397)

## 2020-06-08 MED ORDER — HYDROMORPHONE HCL 1 MG/ML IJ SOLN: 1.0000 mg | INTRAMUSCULAR | Status: DC | PRN

## 2020-06-08 MED ORDER — GABAPENTIN 300 MG PO CAPS
300.0000 mg | ORAL_CAPSULE | Freq: Every day | ORAL | Status: DC
  Administered 2020-06-08 – 2020-06-10 (×3): 300 mg via ORAL
  Filled 2020-06-08 (×3): qty 1

## 2020-06-08 MED ORDER — SODIUM ZIRCONIUM CYCLOSILICATE 5 G PO PACK
5.0000 g | PACK | Freq: Two times a day (BID) | ORAL | Status: DC
  Administered 2020-06-08 – 2020-06-12 (×9): 5 g via ORAL
  Filled 2020-06-08 (×10): qty 1

## 2020-06-08 MED ORDER — OXYCODONE HCL 5 MG PO TABS
15.0000 mg | ORAL_TABLET | ORAL | Status: DC
  Administered 2020-06-08 – 2020-06-14 (×33): 15 mg via ORAL
  Filled 2020-06-08 (×34): qty 3

## 2020-06-08 MED ORDER — HYDROMORPHONE HCL 1 MG/ML IJ SOLN
1.0000 mg | INTRAMUSCULAR | Status: DC | PRN
  Administered 2020-06-08 – 2020-06-14 (×19): 1 mg via INTRAVENOUS
  Filled 2020-06-08 (×20): qty 1

## 2020-06-08 NOTE — Plan of Care (Signed)
  Problem: Activity: Goal: Risk for activity intolerance will decrease Outcome: Progressing   Problem: Coping: Goal: Level of anxiety will decrease Outcome: Progressing   

## 2020-06-08 NOTE — Progress Notes (Signed)
Physical Therapy Treatment Patient Details Name: Steven Cortez MRN: [id number] DOB: 14-Dec-1975 Today's Date: 06/08/2020    History of Present Illness Pt is a 45 year old male with PMHx including Hep C and IVDU who presented with back pain, weakness, dyspnea, pleuritic chest pain, urinary retention, and pitting edema. 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 s/pTEE 05/28/20.   2/11 repeat evacuation / extension of decompression    PT Comments    Pt with significant back pain and spasms over past two days; reports being "scared to move." However, pt is still very pleasant and willing to participate within his abilities. Session focused on gentle BLE stretching with muscle energy technique and bed level exercises. Education discussed regarding diaphragmatic breathing techniques and moving within pain tolerance. Will continue to progress as tolerated.    Follow Up Recommendations  No PT follow up     Equipment Recommendations  Rolling walker with 5" wheels    Recommendations for Other Services       Precautions / Restrictions Precautions Precautions: Fall;Back Restrictions Weight Bearing Restrictions: No    Mobility  Bed Mobility               General bed mobility comments: deferred    Transfers                    Ambulation/Gait                 Stairs             Wheelchair Mobility    Modified Rankin (Stroke Patients Only)       Balance                                            Cognition Arousal/Alertness: Awake/alert Behavior During Therapy: WFL for tasks assessed/performed Overall Cognitive Status: Within Functional Limits for tasks  assessed                                        Exercises General Exercises - Lower Extremity Ankle Circles/Pumps: Both;10 reps;Supine Heel Slides: Both;10 reps;Seated Other Exercises Other Exercises: Supine: bilateral hamstring stretch with MET, knee to chest with MET, gastroc stretch    General Comments        Pertinent Vitals/Pain Pain Assessment: Faces Faces Pain Scale: Hurts even more Pain Location: Pt reports intermittent spasms yesterday and today; none during session. Pain Descriptors / Indicators: Guarding;Grimacing;Sore;Spasm Pain Intervention(s): Monitored during session    Home Living                      Prior Function            PT Goals (current goals can now be found in the care plan section) Acute Rehab PT Goals Patient Stated Goal: home, back to independent PT Goal Formulation: With patient Time For Goal Achievement: 06/22/20 Potential to Achieve Goals: Good    Frequency    Min 3X/week      PT Plan Current plan remains appropriate  Co-evaluation              AM-PAC PT "6 Clicks" Mobility   Outcome Measure  Help needed turning from your back to your side while in a flat bed without using bedrails?: None Help needed moving from lying on your back to sitting on the side of a flat bed without using bedrails?: None Help needed moving to and from a bed to a chair (including a wheelchair)?: None Help needed standing up from a chair using your arms (e.g., wheelchair or bedside chair)?: None Help needed to walk in hospital room?: None Help needed climbing 3-5 steps with a railing? : A Little 6 Click Score: 23    End of Session   Activity Tolerance: Patient limited by pain Patient left: in bed;with call bell/phone within reach Nurse Communication: Mobility status PT Visit Diagnosis: Unsteadiness on feet (R26.81);Pain     Time: 8682-5749 PT Time Calculation (min) (ACUTE ONLY): 21 min  Charges:  $Therapeutic  Exercise: 8-22 mins                     Wyona Almas, PT, DPT Acute Rehabilitation Services Pager [phone] Office [phone]    Deno Etienne 06/08/2020, 4:46 PM

## 2020-06-08 NOTE — Progress Notes (Signed)
Neurosurgery Service Progress Note  Subjective: Muscle spasms, no radicular pain  Objective: Vitals:   06/07/20 1942 06/08/20 0326 06/08/20 0742 06/08/20 1117  BP: (!) 159/96  (!) 166/108 (!) 148/99  Pulse: 91  83 84  Resp: 19  12 16   Temp: 97.8 F (36.6 C)  97.7 F (36.5 C) 97.6 F (36.4 C)  TempSrc: Oral  Oral Oral  SpO2: 99%  100% 97%  Weight:  86 kg    Height:        Physical Exam: BUE 5/5 BLE 4+/5 with stable numbness in the right S1 distribution  Assessment & Plan: 45 y.o. man w/ h/o IVDU and lumbar epidural abscess s/p MIS lami and evacuation, recovering well. Then worsening LBP / new numbness, repeat MRI with recurrence of collection and stenosis, post-op changes. 2/11 repeat evacuation / extension of decompression  -on methocarbamol for muscle spasms, they still appear pretty bad. Can try cyclobenzaprine, tizanidine, or benzos. If refractory, gabapentin usually works but has a little stronger side effect profile.  -no change in neurosurgical plan of care  4/11  06/08/20 11:28 AM

## 2020-06-08 NOTE — Progress Notes (Addendum)
I have seen and examined the patient. I have personally reviewed the clinical findings, laboratory findings, microbiological data and imaging studies. The assessment and treatment plan was discussed with the  Advance Practice Provider, Jeanine Luz  I agree with her/his recommendations except following additions/corrections.  Disseminated MRSA  Bacteremia/TV endocarditis in an IVDU with prior Hep B and hep C infection complicated by septic pulmonary emboli, epidural abscess and discitis/vertebral osteomyelitis s/p laminectomy and evacuation with  worsening LBP / new numbness, repeat MRI with recurrence of collection. S/p 2/11 repeat laminectomy and evacuation of epidural phlegmon.  OR cultures 05/23/20 growing MRSA  There is a potential plan for angiovac and PFO closure later this week and will follow up on that   Continue Daptomycin for now pending plans for angiovac and PFO closure Monitor CBC, BMP and CPK while on IV antibiotics Management of back spasms per primary/NeuroSx Will follow intermittently this week   Odette Fraction, MD [facility]     [facility]  Date of Admission:  05/21/2020     Total days of antibiotics 18         ASSESSMENT:  Steven Cortez continues to have spasms in his lower back and is POD #3 from repeat L4-L5 and L5-S1 decompression in the setting of disseminated MRSA infection with osteomyelitis and tricuspid valve endocarditis. Renal function continues to improve and CK levels are stable. Pain appears to be waxing and waning. Current plan remains for Daptomycin for 8 weeks from 2/11 pending any other potential surgical interventions. PICC line is without evidence of infection. Continue pain management per primary team.   PLAN:  1. Continue current dose of daptomycin.  2. Continue to monitor CK levels.  3. Current plan remains 8 weeks of daptomycin from 2/11 pending addition  surgical intervention. 4. Pain management per primary team.    Principal Problem:   Endocarditis Active Problems:   Urinary retention   AKI (acute kidney injury) (HCC)   Hyperkalemia   Microcytic anemia   MRSA infection   Epidural abscess   Opioid use disorder   Hydronephrosis   Malnutrition of moderate degree   Antibiotic long-term use   . acetaminophen  650 mg Oral Q6H  . amLODipine  5 mg Oral Daily  . carvedilol  3.125 mg Oral BID WC  . Chlorhexidine Gluconate Cloth  6 each Topical Daily  . enoxaparin (LOVENOX) injection  40 mg Subcutaneous Daily  . feeding supplement  237 mL Oral BID BM  . methocarbamol  750 mg Oral TID AC & HS  . multivitamin with minerals  1 tablet Oral Daily  . sodium chloride flush  10-40 mL Intracatheter Q12H  . sodium zirconium cyclosilicate  10 g Oral BID    SUBJECTIVE:  Afebrile overnight with no acute events. Continues to have back pain and spasms. Unable to move readily at present.   No Known Allergies   Review of Systems: Review of Systems  Constitutional: Negative for chills, fever and weight loss.  Respiratory: Negative for cough, shortness of breath and wheezing.   Cardiovascular: Negative for chest pain and leg swelling.  Gastrointestinal: Negative for abdominal pain, constipation, diarrhea, nausea and vomiting.  Musculoskeletal: Positive for back pain.  Skin: Negative for rash.      OBJECTIVE: Vitals:   06/07/20 1531 06/07/20 1942 06/08/20 0326 06/08/20 0742  BP: (!) 174/104 (!) 159/96  (!) 166/108  Pulse: 91 91  83  Resp: (!) 26  19  12  Temp: 97.6 F (36.4 C) 97.8 F (36.6 C)  97.7 F (36.5 C)  TempSrc: Oral Oral  Oral  SpO2: 99% 99%  100%  Weight:   86 kg   Height:       Body mass index is 27.2 kg/m.  Physical Exam Constitutional:      General: He is not in acute distress.    Appearance: He is well-developed and well-nourished. He is not ill-appearing.     Comments: Lying in bed with head of bed slightly  elevated; pleasant.   Cardiovascular:     Rate and Rhythm: Normal rate and regular rhythm.     Pulses: Intact distal pulses.     Heart sounds: Normal heart sounds.     Comments: PICC right upper extremity is clean and dry and without evidence of infection.  Pulmonary:     Effort: Pulmonary effort is normal.     Breath sounds: Normal breath sounds.  Skin:    General: Skin is warm and dry.  Neurological:     Mental Status: He is alert and oriented to person, place, and time.  Psychiatric:        Mood and Affect: Mood and affect normal.        Behavior: Behavior normal.        Thought Content: Thought content normal.        Judgment: Judgment normal.     Lab Results Lab Results  Component Value Date   WBC 6.8 06/08/2020   HGB 7.2 (L) 06/08/2020   HCT 23.6 (L) 06/08/2020   MCV 90.4 06/08/2020   PLT 252 06/08/2020    Lab Results  Component Value Date   CREATININE 1.53 (H) 06/08/2020   BUN 62 (H) 06/08/2020   NA 140 06/08/2020   K 4.0 06/08/2020   CL 106 06/08/2020   CO2 23 06/08/2020    Lab Results  Component Value Date   ALT 46 (H) 06/03/2020   AST 45 (H) 06/03/2020   ALKPHOS 279 (H) 06/03/2020   BILITOT 0.6 06/03/2020     Microbiology: No results found for this or any previous visit (from the past 240 hour(s)).   Steven Eke, Steven Cort[facility]  06/08/2020  10:42 AM

## 2020-06-08 NOTE — Anesthesia Postprocedure Evaluation (Signed)
Anesthesia Post Note  Patient: Steven Cortez  Procedure(s) Performed: REPEAT LUMBAR FOUR-FIVE , LUMBAR FIVE- SACRAL ONE LUMBAR LAMINECTOMY/ DECOMPRESSION WITH MET-RX (N/A Back)     Patient location during evaluation: PACU Anesthesia Type: General Level of consciousness: awake and alert Pain management: pain level controlled Vital Signs Assessment: post-procedure vital signs reviewed and stable Respiratory status: spontaneous breathing, nonlabored ventilation and respiratory function stable Cardiovascular status: blood pressure returned to baseline and stable Postop Assessment: no apparent nausea or vomiting Anesthetic complications: no   No complications documented.  Last Vitals:  Vitals:   06/07/20 1531 06/07/20 1942  BP: (!) 174/104 (!) 159/96  Pulse: 91 91  Resp: (!) 26 19  Temp: 36.4 C 36.6 C  SpO2: 99% 99%    Last Pain:  Vitals:   06/08/20 0320  TempSrc:   PainSc: 7    Pain Goal: Patients Stated Pain Goal: 1 (06/06/20 1140)                 Lynda Rainwater

## 2020-06-08 NOTE — Progress Notes (Signed)
Internal Medicine Attending Note:  Patient seen this morning on rounds. Laying in bed, appears uncomfortable in significant pain. Reports on going back spasms despite robaxin. Scared to move.   Blood pressure (!) 148/99, pulse 84, temperature 97.6 F (36.4 C), temperature source Oral, resp. rate 16, height 5\' 10"  (1.778 m), weight 86 kg, SpO2 97 %. Physical Exam Constitutional: Diaphoretic, appears uncomfortable with pain  Cardiovascular: RRR, loud systolic murmur  Pulmonary/Chest: Clear to ascultation anteriorly, breathing comfortably on RA  Abdominal: Soft, non tender, non distended. +BS.  Extremities: Warm and well perfused. Bilateral pitting LE edema  Patient is a 45 yo M with pmhx of OUD and chronic hepatitis C here with sepsis 2/2 MRSA bacteremia complicated by TV endocarditis, septic pulmonary emboli, lumbar epidural abscess, urinary retention 2/2 to neurogenic bladder, and acute multifactorial renal failure transiently requiring dialysis.   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 Now with post op back spasms.  -- Continue robaxin -- Only receiving his q4h oxy 2-3 times a day. Will schedule this and increased his IV dilaudid PRN for breakthrough -- Appreciate NSG recs; will also add on low dose gabapentin QHS for back spasms. Monitor for sedation.  AKI: Improved some with IVFs. Monitor daily labs, strict I&Os. No NSAIDs.   HTN: Uncontrolled, added back low dose amlodipine yesterday. Continue coreg 3.125 mg BID. Avoid ACEi/ARBs for now with tenuous renal function.   Hypoalbuminemia LE edema due to third  spacing: S/p IV albumin yesterday -- Ordered TED hose  -- Encourage PO intake; will consult nutrition    4/11, MD 06/08/2020, 4:15 PM

## 2020-06-09 DIAGNOSIS — G9340 Encephalopathy, unspecified: Secondary | ICD-10-CM

## 2020-06-09 DIAGNOSIS — D508 Other iron deficiency anemias: Secondary | ICD-10-CM

## 2020-06-09 DIAGNOSIS — I339 Acute and subacute endocarditis, unspecified: Secondary | ICD-10-CM

## 2020-06-09 DIAGNOSIS — B181 Chronic viral hepatitis B without delta-agent: Secondary | ICD-10-CM

## 2020-06-09 LAB — RENAL FUNCTION PANEL
Albumin: 2.1 g/dL — ABNORMAL LOW (ref 3.5–5.0)
Anion gap: 7 (ref 5–15)
BUN: 52 mg/dL — ABNORMAL HIGH (ref 6–20)
CO2: 26 mmol/L (ref 22–32)
Calcium: 8.3 mg/dL — ABNORMAL LOW (ref 8.9–10.3)
Chloride: 106 mmol/L (ref 98–111)
Creatinine, Ser: 1.45 mg/dL — ABNORMAL HIGH (ref 0.61–1.24)
GFR, Estimated: >60 mL/min (ref >60)
Glucose, Bld: 97 mg/dL (ref 70–99)
Phosphorus: 4 mg/dL (ref 2.5–4.6)
Potassium: 4.6 mmol/L (ref 3.5–5.1)
Sodium: 139 mmol/L (ref 135–145)

## 2020-06-09 LAB — CBC
HCT: 23 % — ABNORMAL LOW (ref 39.0–52.0)
Hemoglobin: 7.5 g/dL — ABNORMAL LOW (ref 13.0–17.0)
MCH: 29.1 pg (ref 26.0–34.0)
MCHC: 32.6 g/dL (ref 30.0–36.0)
MCV: 89.1 fL (ref 80.0–100.0)
Platelets: 283 10*3/uL (ref 150–400)
RBC: 2.58 MIL/uL — ABNORMAL LOW (ref 4.22–5.81)
RDW: 16 % — ABNORMAL HIGH (ref 11.5–15.5)
WBC: 8.6 10*3/uL (ref 4.0–10.5)
nRBC: 0 % (ref 0.0–0.2)

## 2020-06-09 MED ORDER — CARVEDILOL 6.25 MG PO TABS
6.2500 mg | ORAL_TABLET | Freq: Two times a day (BID) | ORAL | Status: DC
  Administered 2020-06-09 – 2020-06-16 (×14): 6.25 mg via ORAL
  Filled 2020-06-09 (×14): qty 1

## 2020-06-09 NOTE — Progress Notes (Signed)
Subjective:  Patient reports some improvement to pain, though persistent. Endorses continued back spasms, with decreased duration. Conveys ambulation remains limited, but he has been doing overhead lifts with bed headboard for arm strength.   Objective:  Vital signs in last 24 hours: Vitals:   06/08/20 0742 06/08/20 1117 06/09/20 0403 06/09/20 0921  BP: (!) 166/108 (!) 148/99 (!) 159/99 (!) 160/101  Pulse: 83 84 100   Resp: 12 16 16    Temp: 97.7 F (36.5 C) 97.6 F (36.4 C) 98.8 F (37.1 C)   TempSrc: Oral Oral Oral   SpO2: 100% 97% 97%   Weight:   85.9 kg   Height:       Weight change: -0.1 kg  Intake/Output Summary (Last 24 hours) at 06/09/2020 1212 Last data filed at 06/09/2020 0932 Gross per 24 hour  Intake 954 ml  Output 2300 ml  Net -1346 ml   Physical Exam Constitutional:      General: He is not in acute distress.    Appearance: He is not ill-appearing, toxic-appearing or diaphoretic.     Comments: Patient seen lying flat in bed. NAD.   Cardiovascular:     Rate and Rhythm: Normal rate and regular rhythm.  Pulmonary:     Effort: Pulmonary effort is normal.     Breath sounds: Rales present.  Musculoskeletal:     Comments: 1-2+ pitting edema to mid pre-tibital region  Neurological:     Mental Status: He is alert.      Assessment/Plan:  Principal Problem:   Endocarditis Active Problems:   Urinary retention   AKI (acute kidney injury) (HCC)   Hyperkalemia   Microcytic anemia   MRSA infection   Epidural abscess   Opioid use disorder   Hydronephrosis   Malnutrition of moderate degree   Antibiotic long-term use   Ronnald Houchinsis a 45 y.o.malewith hx of IV drug use and untreated Hep Cwho was diagnosed with endocarditis and MRSA bacteremia at [facility], left AMA then presented to [facility] a few days later. Patient presented with multifactorial renal failure transiently requiring HD. Also found to have large epidural abscess,s/p  laminectomy and evacuation by neurosurgery on1/29. Initially improved, then some progressive back pain and LE weakness.Patient now s/p repeat laminectomy on 2/11. Has had back spasms post-operatively.    MRSA Tricuspid EndocarditisComplicated byseptic pulmonary emboli andlumbarepidural abscesss/p I&D with laminectomy 1/29 complicated by recurrent abscess s/p repeat L4-S1 laminectomy/evacuation Cardiothoracic surgery on board--Plan to reassess following completion of antibiotic course, with repeat echo. Potential AngioVAC outpatient if persistent and patient desires.   Plan -continue daptomycin to complete 8 week course (2/11-4/8) -appreciate neurosurgery -pain management: robaxin 750mg  TID, Gabapentin 300 mg QHS, scheduled oxycodone 15mg  q4h, tylenol q6h, prn dilaudid 1mg  q4h       AKI Hyperkalemia Continues improving. Good UOP. Monitor daily labs, strict I&Os. No NSAIDs  -On Lokelma 5 BID, stop tomorrow if further reduction in K.     HTN: Uncontrolled, on Amlodipine 5 and coreg 3.125 mg BID. Avoid ACEi/ARBs for now with tenuous renal function  -Increased Coreg to 6.25 mg BID -Continue Amlodipine 5 mg     Hypoalbuminemia LE edema due to third spacing: S/p IV albumin x2 few days ago --TED hose placed today -- Encourage PO intake --appreciate Nutrition following:    -liberalize diet with 2g sodium with 1.2 L fluid restriction    -continue Multivitamin with minerals, Ensure BID, magic cup TID,    LOS: 19 days   02-28-1980, Medical  Student 06/09/2020, 12:12 PM

## 2020-06-09 NOTE — Progress Notes (Signed)
   06/09/20 1346  Mobility  Activity Ambulated in room  Level of Assistance Moderate assist, patient does 50-74%  Assistive Device Front wheel walker  Distance Ambulated (ft) 5 ft  Mobility Response Tolerated poorly

## 2020-06-09 NOTE — Progress Notes (Signed)
Nutrition Follow-up  DOCUMENTATION CODES:   Non-severe (moderate) malnutrition in context of chronic illness  INTERVENTION:   -Liberalize diet with 2 gram sodium with 1.2 L fluid restriction -Continue MVI with minerals daily -Continue Ensure Enlive po BID, each supplement provides 350 kcal and 20 grams of protein -Continue Magic cup TID with meals, each supplement provides 290 kcal and 9 grams of protein  NUTRITION DIAGNOSIS:   Moderate Malnutrition related to chronic illness (IV drug abuse) as evidenced by energy intake < or equal to 75% for > or equal to 1 month,mild muscle depletion,mild fat depletion.  Ongoing  GOAL:   Patient will meet greater than or equal to 90% of their needs  Progressing   MONITOR:   PO intake,Supplement acceptance,Labs,Weight trends,Skin,I & O's  REASON FOR ASSESSMENT:   LOS    ASSESSMENT:   Steven Cortez is a 45 y.o. male with hx of IV drug use and untreated Hep C who was diagnosed with endocarditis and MRSA bacteremia at [facility], left AMA then presented to [facility] a few days later. Also found to have large epidural abscess, s/p laminectomy and evacuation by neurosurgery on 1/29. Symptoms and leukocytosis are improving with antibiotics.  1/30- first HD treatment 2/1- second HD treatment 2/3- s/p TEE- revealed large tricuspid valve vegetation, moderate to severe TR, and possible tiny PFO 2/4- HD cath removed 2/11- s/p PROCEDURE:Repeat L4-5, L5-S1 MIS laminectomies, evacuation of epidural phlegmon  Reviewed I/O's: -1.4 L x 24 hours and -13.2 L since 05/26/20  UOP: 2.3 L x 24 hours  Pt unavailable at time of visit.   Per CVTS notes, plan to hold off on angio VAC debridement until completion of IV antibiotics.   Pt continues with improved appetite. Noted meal completions 100%. He is consuming Ensure Enlive supplements. MD agreeable to liberalized diet.  Medications reviewed and include lokelma.   Labs reviewed.   Diet  Order:   Diet Order            Diet renal with fluid restriction Fluid restriction: 1200 mL Fluid; Room service appropriate? Yes; Fluid consistency: Thin  Diet effective now                 EDUCATION NEEDS:   Education needs have been addressed  Skin:  Skin Assessment: Skin Integrity Issues: Skin Integrity Issues:: Incisions Incisions: closed back  Last BM:  06/08/20  Height:   Ht Readings from Last 1 Encounters:  06/05/20 5\' 10"  (1.778 m)    Weight:   Wt Readings from Last 1 Encounters:  06/09/20 85.9 kg    Ideal Body Weight:  75.5 kg  BMI:  Body mass index is 27.17 kg/m.  Estimated Nutritional Needs:   Kcal:  2300-2500  Protein:  135-155 grams  Fluid:  > 2 L    06/11/20, RD, LDN, CDCES Registered Dietitian II Certified Diabetes Care and Education Specialist Please refer to [facility] for RD and/or RD on-call/weekend/after hours pager

## 2020-06-09 NOTE — Progress Notes (Signed)
   06/09/20 0946  Incentive Spirometry  Incentive Spirometry Goal (mL) (RN or RT) 1000 mL  Incentive Spirometry - Achieved (mL) (RN, NT, or RT) 750 mL  Incentive Spirometry - # of Times (RN or NT) 10  Incentive Spirometry Effort (RN) Needs reinforcement

## 2020-06-09 NOTE — Progress Notes (Signed)
     [address]       [place] [address]             [phone]       I had a long conversation with Mr. Hutchins today.  From a respiratory status he states that he is doing much better.  His only complaint is back pain.  We discussed the nature of the procedure in the setting of his PFO, and he is concerned about his stroke risk.  I think given this concern continue with antibiotic therapy for now is the ideal option.  Once she has completed his course of antibiotic therapy we will reassess him with echocardiogram to evaluate the status of his vegetation.  If it is still present at that point and he is less hesitant to proceed with angio VAC debridement then we can schedule him as an outpatient for the procedure.  Please call with questions  Corliss Skains

## 2020-06-09 NOTE — Progress Notes (Signed)
k pad had been ordered. Currently waiting to be brought to unit.

## 2020-06-09 NOTE — Progress Notes (Signed)
   06/09/20 0921  Incision (Closed) 06/05/20 Back  Date First Assessed/Time First Assessed: 06/05/20 1546   Location: Back  Dressing Type Gauze (Comment);Transparent dressing  Dressing Old drainage (marked);Changed  Dressing Change Frequency PRN  Site / Wound Assessment Painful  Margins Attached edges (approximated)  Drainage Description Serosanguineous (old drainage)   Area appears swollen and painful to touch. Area marked and new gauze with dressing placed.   On[place]orah  notified of assessment.  Will continue to monitor.

## 2020-06-09 NOTE — Progress Notes (Signed)
   06/09/20 0945  Mechanical VTE Prophylaxis (All Areas)  Mechanical VTE Prophylaxis Antiembolism stockings, knee (TED hose)  Mechanical VTE Prophylaxis Intervention On

## 2020-06-09 NOTE — Progress Notes (Addendum)
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 OR cultures growing MRSA  Prior Hepatitis B - Positive Hep B surface antibody and Hep B core antobody. Hep B surface ag NR HCV ab reactive, HCV RNA negative  Recommendations No plans for surgical intervention by CT Sx currently with a plan for echocardiogram after treatment course with antibiotics for assessing need for angiovac Continue Daptomycin as previously recommended by Dr Renold Don  for 8 weeks from 2/11. End date 2020-08-30 Monitor CPK while on Daptomycin  Minimal serosanguinous fluid soaked dressing at the back noted, would continue to monitor for now  Will get Hep B e antigen, e antibody and Hep B DNA Will need to FU with CT surgery upon discharge for repeat echocardiogram and possibly ID clinic follow up    Rest of the management as per the primary team. Thank you for the consult. Call us back with questions or any changes   ______________________________________________________________________ Subjective patient seen and examined at the bedside. He is lying in bed and turning to the left side. Back pain is stable, continues to have back spasms. Minimal serosanguionous  soaked dressing noted at the back   Vitals BP (!) 160/101   Pulse 100   Temp 98.8 F (37.1 C) (Oral)   Resp 16   Ht 5\' 10"  (1.778 m)   Wt 85.9 kg   SpO2 97%   BMI 27.17 kg/m     Constitutional:      General: He is not in acute distress.    Appearance: He is well-developed and well-nourished. He is not ill-appearing.     Comments: Lying in bed turning to the left side  Cardiovascular:     Rate and Rhythm: Normal rate and regular rhythm.     Pulses: Intact distal pulses.     Heart sounds: Normal heart sounds.     Comments: PICC right upper extremity is clean and dry and without evidence of infection.  Pulmonary:     Effort: Pulmonary effort is normal.     Breath sounds: Normal breath sounds.  Skin:    General: Skin is warm and dry.  Neurological:     Mental Status: He is alert and oriented to person, place, and time. Back - minimal swelling , hypersensitive to tpuch due to spasms  Psychiatric:        Mood and Affect: Mood and affect normal.        Behavior: Behavior normal.        Thought Content: Thought content normal.        Judgment: Judgment normal.    Pertinent Microbiology Results for orders placed or performed during the hospital encounter of 05/21/20  SARS Coronavirus 2 by RT PCR (hospital order, performed in [facility] hospital lab) Nasopharyngeal Nasopharyngeal Swab     Status: None   Collection Time: 05/21/20  8:45 AM   Specimen: Nasopharyngeal Swab  Result Value Ref Range Status   SARS Coronavirus 2 NEGATIVE NEGATIVE Final    Comment: (NOTE) SARS-CoV-2 target nucleic acids are NOT DETECTED.  The SARS-CoV-2 RNA is generally detectable in upper and lower respiratory specimens during the acute phase of infection. The lowest concentration of SARS-CoV-2 viral copies this assay can detect is 250 copies / mL. A negative result does not preclude SARS-CoV-2 infection and should not be used as the sole basis for treatment or other patient management decisions.   A negative result may occur with improper specimen collection / handling, submission of specimen other than nasopharyngeal swab, presence of viral mutation(s) within the areas targeted by this assay, and inadequate number of viral copies (<250 copies / mL). A negative result must be combined with clinical observations, patient history, and epidemiological information.  Fact Sheet for Patients:   BoilerBrush.com.cy  Fact Sheet for Healthcare Providers: https://pope.com/  This test is not yet approved or  cleared by the [place] FDA and has been authorized for detection and/or diagnosis of SARS-CoV-2 by FDA under an Emergency Use Authorization (EUA).  This EUA will remain in effect (meaning this test can be used) for the duration of the COVID-19 declaration under Section 564(b)(1) of the Act, 21 U.S.C. section 360bbb-3(b)(1), unless the authorization is terminated or revoked sooner.  Performed at [facility], [address] [place], [place] [address]   Blood culture (routine x 2)     Status: None   Collection Time: 05/21/20  9:18 AM   Specimen: BLOOD  Result Value Ref Range Status   Specimen Description BLOOD SITE NOT SPECIFIED  Final   Special Requests   Final    BOTTLES DRAWN AEROBIC ONLY Blood Culture results may not be optimal due to an inadequate volume of blood received in culture bottles   Culture   Final    NO GROWTH 5 DAYS Performed at [facility], [address], [place], [place] [address]    Report Status 05/26/2020 FINAL  Final  Surgical pcr screen     Status: Abnormal   Collection Time: 05/23/20  7:26 AM   Specimen: Nasal Mucosa; Nasal Swab  Result Value Ref Range Status   MRSA, PCR POSITIVE (A) NEGATIVE Final    Comment: CRITICAL RESULT CALLED TO, READ BACK BY AND VERIFIED WITH: TINA ISSACS AT 1022 1/29 BY MM    Staphylococcus aureus POSITIVE (A) NEGATIVE Final    Comment: (NOTE) The Xpert SA  Assay (FDA approved for NASAL specimens in patients 81 years of age and older), is one component of a comprehensive surveillance program. It is not intended to diagnose infection nor to guide or monitor treatment. Performed at [facility], [address], [place], [place] [address]   Aerobic/Anaerobic Culture (surgical/deep wound)     Status: None   Collection Time: 05/23/20  9:07 AM   Specimen: Abscess  Result Value Ref Range Status   Specimen Description ABSCESS  Final   Special Requests LUMBAR EPIDURAL SPEC A  Final   Gram Stain   Final    RARE WBC PRESENT,BOTH PMN AND MONONUCLEAR NO ORGANISMS SEEN    Culture   Final    FEW METHICILLIN RESISTANT STAPHYLOCOCCUS AUREUS NO ANAEROBES ISOLATED Performed at [facility], [address] [place], [place] [address]    Report Status 05/28/2020 FINAL  Final   Organism ID, Bacteria METHICILLIN RESISTANT STAPHYLOCOCCUS AUREUS  Final  Susceptibility   Methicillin resistant staphylococcus aureus - MIC*    CIPROFLOXACIN >=8 RESISTANT Resistant     ERYTHROMYCIN >=8 RESISTANT Resistant     GENTAMICIN <=0.5 SENSITIVE Sensitive     OXACILLIN >=4 RESISTANT Resistant     TETRACYCLINE <=1 SENSITIVE Sensitive     VANCOMYCIN <=0.5 SENSITIVE Sensitive     TRIMETH/SULFA <=10 SENSITIVE Sensitive     CLINDAMYCIN <=0.25 SENSITIVE Sensitive     RIFAMPIN <=0.5 SENSITIVE Sensitive     Inducible Clindamycin NEGATIVE Sensitive     * FEW METHICILLIN RESISTANT STAPHYLOCOCCUS AUREUS    Pertinent Lab. CBC Latest Ref Rng & Units 06/09/2020 06/08/2020 06/07/2020  WBC 4.0 - 10.5 K/uL 8.6 6.8 7.2  Hemoglobin 13.0 - 17.0 g/dL 7.5(L) 7.2(L) 7.3(L)  Hematocrit 39.0 - 52.0 % 23.0(L) 23.6(L) 23.3(L)  Platelets 150 - 400 K/uL 283 252 216   CMP Latest Ref Rng & Units 06/09/2020 06/08/2020 06/07/2020  Glucose 70 - 99 mg/dL 97 073(X) 95  BUN 6 - 20 mg/dL 10(G) 26(R) 48(N)  Creatinine 0.61 - 1.24 mg/dL 4.62(V) 0.35(K) 0.93(G)  Sodium 135 - 145 mmol/L 139  140 140  Potassium 3.5 - 5.1 mmol/L 4.6 4.0 4.5  Chloride 98 - 111 mmol/L 106 106 106  CO2 22 - 32 mmol/L 26 23 24   Calcium 8.9 - 10.3 mg/dL 8.3(L) 8.2(L) 7.8(L)  Total Protein 6.5 - 8.1 g/dL - - -  Total Bilirubin 0.3 - 1.2 mg/dL - - -  Alkaline Phos 38 - 126 U/L - - -  AST 15 - 41 U/L - - -  ALT 0 - 44 U/L - - -     Pertinent Imaging today Plain films and CT images have been personally visualized and interpreted; radiology reports have been reviewed. Decision making incorporated into the Impression / Recommendations.  I have spent approx 30 minutes for this patient encounter including review of prior medical records with greater than 50% of time being face to face and coordination of their care.  Electronically signed by:   , MD Infectious Disease Physician [facility] for Infectious Disease Pager: [phone]

## 2020-06-10 LAB — RENAL FUNCTION PANEL
Albumin: 1.9 g/dL — ABNORMAL LOW (ref 3.5–5.0)
Anion gap: 9 (ref 5–15)
BUN: 55 mg/dL — ABNORMAL HIGH (ref 6–20)
CO2: 26 mmol/L (ref 22–32)
Calcium: 8.1 mg/dL — ABNORMAL LOW (ref 8.9–10.3)
Chloride: 103 mmol/L (ref 98–111)
Creatinine, Ser: 1.85 mg/dL — ABNORMAL HIGH (ref 0.61–1.24)
GFR, Estimated: 45 mL/min — ABNORMAL LOW (ref >60)
Glucose, Bld: 113 mg/dL — ABNORMAL HIGH (ref 70–99)
Phosphorus: 4.4 mg/dL (ref 2.5–4.6)
Potassium: 4.6 mmol/L (ref 3.5–5.1)
Sodium: 138 mmol/L (ref 135–145)

## 2020-06-10 LAB — CBC
HCT: 20.7 % — ABNORMAL LOW (ref 39.0–52.0)
HCT: 21.1 % — ABNORMAL LOW (ref 39.0–52.0)
Hemoglobin: 6.8 g/dL — CL (ref 13.0–17.0)
Hemoglobin: 6.7 g/dL — CL (ref 13.0–17.0)
MCH: 29.6 pg (ref 26.0–34.0)
MCH: 28.9 pg (ref 26.0–34.0)
MCHC: 32.9 g/dL (ref 30.0–36.0)
MCHC: 31.8 g/dL (ref 30.0–36.0)
MCV: 90 fL (ref 80.0–100.0)
MCV: 90.9 fL (ref 80.0–100.0)
Platelets: 239 10*3/uL (ref 150–400)
Platelets: 189 10*3/uL (ref 150–400)
RBC: 2.3 MIL/uL — ABNORMAL LOW (ref 4.22–5.81)
RBC: 2.32 MIL/uL — ABNORMAL LOW (ref 4.22–5.81)
RDW: 16 % — ABNORMAL HIGH (ref 11.5–15.5)
RDW: 15.7 % — ABNORMAL HIGH (ref 11.5–15.5)
WBC: 9.3 10*3/uL (ref 4.0–10.5)
WBC: 8.2 10*3/uL (ref 4.0–10.5)
nRBC: 0 % (ref 0.0–0.2)
nRBC: 0 % (ref 0.0–0.2)

## 2020-06-10 LAB — PREPARE RBC (CROSSMATCH)

## 2020-06-10 LAB — C-REACTIVE PROTEIN: CRP: 18.6 mg/dL — ABNORMAL HIGH (ref <1.0)

## 2020-06-10 LAB — HEMOGLOBIN AND HEMATOCRIT, BLOOD
HCT: 20.6 % — ABNORMAL LOW (ref 39.0–52.0)
Hemoglobin: 6.5 g/dL — CL (ref 13.0–17.0)

## 2020-06-10 LAB — SEDIMENTATION RATE: Sed Rate: 120 mm/hr — ABNORMAL HIGH (ref 0–16)

## 2020-06-10 MED ORDER — SODIUM CHLORIDE 0.9% IV SOLUTION: Freq: Once | INTRAVENOUS | Status: AC

## 2020-06-10 MED ORDER — AMLODIPINE BESYLATE 10 MG PO TABS
10.0000 mg | ORAL_TABLET | Freq: Every day | ORAL | Status: DC
  Administered 2020-06-11 – 2020-07-15 (×34): 10 mg via ORAL
  Filled 2020-06-10 (×34): qty 1

## 2020-06-10 MED ORDER — SODIUM CHLORIDE 0.9 % IV BOLUS
500.0000 mL | Freq: Once | INTRAVENOUS | Status: AC
  Administered 2020-06-10: 500 mL via INTRAVENOUS

## 2020-06-10 MED ORDER — ALBUMIN HUMAN 25 % IV SOLN
25.0000 g | Freq: Once | INTRAVENOUS | Status: AC
  Administered 2020-06-10: 25 g via INTRAVENOUS
  Filled 2020-06-10: qty 100

## 2020-06-10 NOTE — Progress Notes (Addendum)
I have seen and examined the patient. I have personally reviewed the clinical findings, laboratory findings, microbiological data and imaging studies. The assessment and treatment plan was discussed with the  Advance Practice Provider, Steven Cortez  I agree with her/his recommendations except following additions/corrections.  Back spasms seems to have somewhat improved. The surgical site at the back seems to be swollen with acute drop in hb ( ? Hematoma). Back pain is stable   Afebrile, no leukocytosis and otherwise stable  Continue Daptomycin. CPK 82. Cr seems to be stable  Would have Neurosurgery evaluate the back wound given concerns for hematoma vs seroma  Monitor CBC, BMP and CPK on antibiotics  Fu Hep B serology   Steven Fraction, MD [facility]   [facility]  Date of Admission:  05/21/2020     Total days of antibiotics 20         ASSESSMENT:  Steven Cortez appears to have hematoma/seroma forming around his incision site. Hemoglobin down today making it likely that this may be a hemtoma. Spasms are overall improved. Would likely benefit from Neurosurgery opinion. Most recent CK level of 82 with no adverse side effects to Daptomycin. Continue daptomycin. Anemia being treated by primary team.  PLAN:  1. Continue daptomycin. 2. Anemia treatment per primary team. 3. Pain / spasm management per primary team and Neurosurgery.  4. Recommend evaluation of back wound by Neurosurgery for possible hematoma    Principal Problem:   Endocarditis Active Problems:   Urinary retention   AKI (acute kidney injury) (HCC)   Hyperkalemia   Microcytic anemia   MRSA infection   Epidural abscess   Opioid use disorder   Hydronephrosis   Malnutrition of moderate degree   Antibiotic long-term use   Chronic viral hepatitis B without delta agent and without coma (HCC)   . acetaminophen  650 mg Oral Q6H  .  amLODipine  5 mg Oral Daily  . carvedilol  6.25 mg Oral BID WC  . Chlorhexidine Gluconate Cloth  6 each Topical Daily  . enoxaparin (LOVENOX) injection  40 mg Subcutaneous Daily  . feeding supplement  237 mL Oral BID BM  . gabapentin  300 mg Oral QHS  . methocarbamol  750 mg Oral TID AC & HS  . multivitamin with minerals  1 tablet Oral Daily  . oxyCODONE  15 mg Oral Q4H  . sodium chloride flush  10-40 mL Intracatheter Q12H  . sodium zirconium cyclosilicate  5 g Oral BID    SUBJECTIVE:  Afebrile overnight with no acute events. Spasms are overall improved but continuing. Nursing using K-Pad. Pain occurs primarily when trying to sit up. Drainage has decreased. Denied fevers, chills, nausea, vomiting.   No Known Allergies   Review of Systems: Review of Systems  Constitutional: Negative for chills, fever and weight loss.  Respiratory: Negative for cough, shortness of breath and wheezing.   Cardiovascular: Negative for chest pain and leg swelling.  Gastrointestinal: Negative for abdominal pain, constipation, diarrhea, nausea and vomiting.  Musculoskeletal: Positive for back pain.  Skin: Negative for rash.      OBJECTIVE: Vitals:   06/09/20 2237 06/10/20 0637 06/10/20 0845 06/10/20 0941  BP: (!) 153/103 (!) 141/90 138/88 134/71  Pulse: 89 87 90 87  Resp: 16 18  18   Temp: 99.9 F (37.7 C) 99 F (37.2 C)  98.6 F (37 C)  TempSrc: Oral Oral  Oral  SpO2: 97% 95% 97%  99%  Weight:  83.9 kg    Height:       Body mass index is 26.54 kg/m.  Physical Exam Constitutional:      General: He is not in acute distress.    Appearance: He is well-developed and well-nourished.     Comments: Lying in bed flat; pleasant.   Cardiovascular:     Rate and Rhythm: Normal rate and regular rhythm.     Pulses: Intact distal pulses.     Heart sounds: Normal heart sounds.  Pulmonary:     Effort: Pulmonary effort is normal.     Breath sounds: Normal breath sounds.  Musculoskeletal:      Comments: Approximately grape sized lesion under surgical incision that is tender.   Skin:    General: Skin is warm and dry.  Neurological:     Mental Status: He is alert and oriented to person, place, and time.  Psychiatric:        Mood and Affect: Mood and affect normal.        Behavior: Behavior normal.        Thought Content: Thought content normal.        Judgment: Judgment normal.     Lab Results Lab Results  Component Value Date   WBC 9.3 06/10/2020   HGB 6.8 (LL) 06/10/2020   HCT 20.7 (L) 06/10/2020   MCV 90.0 06/10/2020   PLT 239 06/10/2020    Lab Results  Component Value Date   CREATININE 1.85 (H) 06/10/2020   BUN 55 (H) 06/10/2020   NA 138 06/10/2020   K 4.6 06/10/2020   CL 103 06/10/2020   CO2 26 06/10/2020    Lab Results  Component Value Date   ALT 46 (H) 06/03/2020   AST 45 (H) 06/03/2020   ALKPHOS 279 (H) 06/03/2020   BILITOT 0.6 06/03/2020     Microbiology: No results found for this or any previous visit (from the past 240 hour(s)).   Steven Eke, NP [facility]  06/10/2020  10:39 AM

## 2020-06-10 NOTE — Progress Notes (Signed)
CRITICAL VALUE ALERT  Critical Value: Hemoblobin: 6.5  Date & Time Notied:  06/10/2020  1630  Provider Notified: Marijo Conception MD [phone]

## 2020-06-10 NOTE — Progress Notes (Signed)
OT Cancellation Note  Patient Details Name: Jovani Flury MRN: [id number] DOB: 12-02-1975   Cancelled Treatment:    Reason Eval/Treat Not Completed: Pain limiting ability to participate.  OT will follow up as appropriate.    Ethel Veronica D Graysin Luczynski 06/10/2020, 4:29 PM

## 2020-06-10 NOTE — Plan of Care (Signed)
  Problem: Activity: Goal: Risk for activity intolerance will decrease Outcome: Progressing   

## 2020-06-10 NOTE — Progress Notes (Signed)
   06/10/20 1204  Incentive Spirometry  Incentive Spirometry Goal (mL) (RN or RT) 1000 mL  Incentive Spirometry - Achieved (mL) (RN, NT, or RT) 1000 mL  Incentive Spirometry - # of Times (RN or NT) 5  Incentive Spirometry Effort (RN) Satisfactory

## 2020-06-10 NOTE — Progress Notes (Signed)
Lab called with a critical hemoglobin value of 6.8. Primary RN notified.

## 2020-06-10 NOTE — Progress Notes (Signed)
Pt complaining of increased spasms in back and swelling in his surgical incision.  Upon assessment of surgical incision this morning, gauze has blood tinged drainage and area is swollen and painful to touch.   Primary MD made aware of findings.   Type and screen been collected this morning.   Will continue to monitor.

## 2020-06-10 NOTE — Progress Notes (Signed)
H & H sent to lab.

## 2020-06-10 NOTE — Progress Notes (Signed)
CRITICAL VALUE ALERT  Critical Value:  Hemoglobin 6.7  Date & Time Notied:  06/10/2020 1700  Provider Notified: Marijo Conception MD

## 2020-06-10 NOTE — Progress Notes (Signed)
Subjective:  Patient reports improvement to muscle spasms. Pain stable from yesterday, rated as 4/10 at rest, with enhanced pain on exertion. Feels more comfortable laying flat, increased pain with anterior bending.  Conveys improvement to ankle numbness. Notes legs feel a bit lighter.    Objective:  Vital signs in last 24 hours: Vitals:   06/09/20 2237 06/10/20 0637 06/10/20 0845 06/10/20 0941  BP: (!) 153/103 (!) 141/90 138/88 134/71  Pulse: 89 87 90 87  Resp: 16 18  18   Temp: 99.9 F (37.7 C) 99 F (37.2 C)  98.6 F (37 C)  TempSrc: Oral Oral  Oral  SpO2: 97% 95% 97% 99%  Weight:  83.9 kg    Height:       Weight change: -2 kg  Intake/Output Summary (Last 24 hours) at 06/10/2020 1112 Last data filed at 06/10/2020 1029 Gross per 24 hour  Intake 1299.04 ml  Output 1850 ml  Net -550.96 ml    Physical Exam Constitutional:      General: He is not in acute distress.    Appearance: He is not ill-appearing, toxic-appearing or diaphoretic.     Comments: Patient seen lying flat in bed, NAD. Some observed discomfort with transfers in bed  Cardiovascular:     Rate and Rhythm: Normal rate and regular rhythm.  Pulmonary:     Effort: Pulmonary effort is normal. No tachypnea or respiratory distress.  Musculoskeletal:     Comments: TED Hose in place. 1+ pitting edema, mildly improved from prior exam.   Skin:    Comments: Incision clean and intact. Small stable peri-incisional elevation consistent with seroma (vs hematoma). Minimal serosanguinous fluid observed per dressing.   Neurological:     Mental Status: He is alert.     Assessment/Plan:  Principal Problem:   Endocarditis Active Problems:   Urinary retention   AKI (acute kidney injury) (HCC)   Hyperkalemia   Microcytic anemia   MRSA infection   Epidural abscess   Opioid use disorder   Hydronephrosis   Malnutrition of moderate degree   Antibiotic long-term use   Chronic viral hepatitis B without delta agent and  without coma (HCC)   Steven Houchinsis a 44 y.o.malewith hx of IV drug use and untreated Hep Cwho was diagnosed with endocarditis and MRSA bacteremia at [facility], left AMA then presented to [facility] a few days later. Patient presented with multifactorial renal failure transiently requiring HD. Also found to have large epidural abscess,s/p laminectomy and evacuation by neurosurgery on1/29. Initially improved, then some progressive back pain and LE weakness.Patient now s/p repeat laminectomy on 2/11. Has had back spasms post-operatively.    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 Muscle spasms post-operatively, that are improving.    Cardiothoracic surgery on board--Plan to reassess following completion of antibiotic course, with repeat echo. Potential AngioVAC outpatient if persistent and patient desires.   Plan -continue daptomycin to complete 8 week course (2/11-4/8) -appreciate neurosurgery & CT surgery -pain management: robaxin 750mg  TID, Gabapentin 300 mg QHS, scheduled oxycodone 15mg  q4h, tylenol q6h, prn dilaudid 1mg  q4h   -Kpad     Anemia of chronic disease, Anemia of critical illness. Some drop in Hgb to 6.8today from 7.5 yesterday.  -ordered 1 unit pRBC, f/u post-transfusion H &H.  -Trend CBC    AKI Hyperkalemia Some uptrend in creatinine today. Good UOP. Monitor daily labs, strict I&Os. No NSAIDs  -Continue on  Lokelma 5 BID  -500 cc NS bolus    XYV:[id number], on Amlodipine 5 and coreg 6.25 mg BID. Avoid ACEi/ARBs for now with tenuous renal function  -Continue Coreg 6.25 mg BID -Increase Amlodipine to 10 mg   Hypoalbuminemia LE edema due to third spacing: S/p IV albumin x2 few days ago --IV albumin today --TED hose in place -- Encourage PO intake --appreciate  Nutrition following:    -liberalize diet with 2g sodium with 1.2 L fluid restriction    -continue Multivitamin with minerals, Ensure BID, magic cup TID   LOS: 20 days   [name], Medical Student 06/10/2020, 11:12 AM

## 2020-06-10 NOTE — Progress Notes (Addendum)
Physical Therapy Treatment Patient Details Name: Steven Cortez MRN: [id number] DOB: 1975/07/18 Today's Date: 06/10/2020    History of Present Illness Pt is a 45 year old male with PMHx including Hep C and IVDU who presented with back pain, weakness, dyspnea, pleuritic chest pain, urinary retention, and pitting edema. He was recently admitted to [facility] and found to have tricuspid endocarditis with MRSA bacteremia, and pulmonary septic emboli but left AMA. 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 s/pTEE 05/28/20.   2/11 repeat evacuation / extension of decompression    PT Comments    Pt regressing towards physical therapy goals. Pt is experiencing severe low back and right hip pain in addition to muscle spasms, despite premedicating. Pt remains very motivated and is disappointed by lack of progress. Attempted to sit up on side of bed today; pt able to achieve after ~5 minutes, however, quickly had to lie back down due to sharp increase in pain levels. RN notified. We discussed diaphragmatic breathing again, which pt stated has been helping work through some of the spasms. Will continue to progress as tolerated.     Follow Up Recommendations  No PT follow up     Equipment Recommendations  Rolling walker with 5" wheels    Recommendations for Other Services       Precautions / Restrictions Precautions Precautions: Fall;Back Restrictions Weight Bearing Restrictions: No    Mobility  Bed Mobility Overal bed mobility: Needs Assistance Bed Mobility: Rolling;Sidelying to Sit;Sit to Sidelying Rolling: Supervision Sidelying to sit: Supervision     Sit to sidelying: Min assist General bed mobility comments: Pt not requiring physical assist for progressing to edge of bed, just significantly increased time and effort. Cues for log roll technique, pt  with use of rail, however frequently would have to bring legs back up due to sharp pains when progressing off edge of bed. MinA for LE negotiation back into bed.    Transfers                 General transfer comment: deferred by pt  Ambulation/Gait                 Stairs             Wheelchair Mobility    Modified Rankin (Stroke Patients Only)       Balance                                            Cognition Arousal/Alertness: Awake/alert Behavior During Therapy: WFL for tasks assessed/performed Overall Cognitive Status: Within Functional Limits for tasks assessed                                        Exercises      General Comments        Pertinent Vitals/Pain Pain Assessment: Faces Faces Pain Scale: Hurts worst Pain Location: Low back, R hip Pain Descriptors / Indicators: Guarding;Grimacing;Spasm;Sharp Pain Intervention(s): Monitored during session;Limited activity within patient's tolerance;Premedicated before session;Repositioned    Home Living                      Prior Function  PT Goals (current goals can now be found in the care plan section) Acute Rehab PT Goals Patient Stated Goal: home, back to independent PT Goal Formulation: With patient Time For Goal Achievement: 06/22/20 Potential to Achieve Goals: Good Progress towards PT goals: Not progressing toward goals - comment (limited by pain)    Frequency    Min 3X/week      PT Plan Current plan remains appropriate    Co-evaluation              AM-PAC PT "6 Clicks" Mobility   Outcome Measure  Help needed turning from your back to your side while in a flat bed without using bedrails?: None Help needed moving from lying on your back to sitting on the side of a flat bed without using bedrails?: None Help needed moving to and from a bed to a chair (including a wheelchair)?: None Help needed standing up from  a chair using your arms (e.g., wheelchair or bedside chair)?: None Help needed to walk in hospital room?: None Help needed climbing 3-5 steps with a railing? : A Little 6 Click Score: 23    End of Session   Activity Tolerance: Patient limited by pain Patient left: in bed;with call bell/phone within reach Nurse Communication: Mobility status PT Visit Diagnosis: Unsteadiness on feet (R26.81);Pain     Time: 7782-4235 PT Time Calculation (min) (ACUTE ONLY): 18 min  Charges:  $Therapeutic Activity: 8-22 mins                     Lillia Pauls, PT, DPT Acute Rehabilitation Services Pager [phone] Office [phone]    Norval Morton 06/10/2020, 4:58 PM

## 2020-06-11 ENCOUNTER — Inpatient Hospital Stay (HOSPITAL_COMMUNITY): Payer: Medicaid Other

## 2020-06-11 LAB — CBC
HCT: 24.5 % — ABNORMAL LOW (ref 39.0–52.0)
HCT: 25.7 % — ABNORMAL LOW (ref 39.0–52.0)
Hemoglobin: 7.8 g/dL — ABNORMAL LOW (ref 13.0–17.0)
Hemoglobin: 8.1 g/dL — ABNORMAL LOW (ref 13.0–17.0)
MCH: 28.6 pg (ref 26.0–34.0)
MCH: 28.3 pg (ref 26.0–34.0)
MCHC: 31.8 g/dL (ref 30.0–36.0)
MCHC: 31.5 g/dL (ref 30.0–36.0)
MCV: 89.7 fL (ref 80.0–100.0)
MCV: 89.9 fL (ref 80.0–100.0)
Platelets: 219 10*3/uL (ref 150–400)
Platelets: 234 10*3/uL (ref 150–400)
RBC: 2.73 MIL/uL — ABNORMAL LOW (ref 4.22–5.81)
RBC: 2.86 MIL/uL — ABNORMAL LOW (ref 4.22–5.81)
RDW: 15.6 % — ABNORMAL HIGH (ref 11.5–15.5)
RDW: 15.7 % — ABNORMAL HIGH (ref 11.5–15.5)
WBC: 10.6 10*3/uL — ABNORMAL HIGH (ref 4.0–10.5)
WBC: 10.6 10*3/uL — ABNORMAL HIGH (ref 4.0–10.5)
nRBC: 0 % (ref 0.0–0.2)
nRBC: 0 % (ref 0.0–0.2)

## 2020-06-11 LAB — URINALYSIS, ROUTINE W REFLEX MICROSCOPIC
Bilirubin Urine: NEGATIVE
Glucose, UA: NEGATIVE mg/dL
Ketones, ur: NEGATIVE mg/dL
Nitrite: NEGATIVE
Protein, ur: >=300 mg/dL — AB
RBC / HPF: >50 RBC/hpf — ABNORMAL HIGH (ref 0–5)
Specific Gravity, Urine: 1.015 (ref 1.005–1.030)
pH: 5 (ref 5.0–8.0)

## 2020-06-11 LAB — FOLATE: Folate: 15.8 ng/mL (ref >5.9)

## 2020-06-11 LAB — HEPATITIS B E ANTIBODY: Hep B E Ab: NEGATIVE

## 2020-06-11 LAB — RENAL FUNCTION PANEL
Albumin: 2.1 g/dL — ABNORMAL LOW (ref 3.5–5.0)
Anion gap: 10 (ref 5–15)
BUN: 57 mg/dL — ABNORMAL HIGH (ref 6–20)
CO2: 25 mmol/L (ref 22–32)
Calcium: 7.9 mg/dL — ABNORMAL LOW (ref 8.9–10.3)
Chloride: 103 mmol/L (ref 98–111)
Creatinine, Ser: 2.32 mg/dL — ABNORMAL HIGH (ref 0.61–1.24)
GFR, Estimated: 35 mL/min — ABNORMAL LOW (ref >60)
Glucose, Bld: 102 mg/dL — ABNORMAL HIGH (ref 70–99)
Phosphorus: 5.1 mg/dL — ABNORMAL HIGH (ref 2.5–4.6)
Potassium: 4.5 mmol/L (ref 3.5–5.1)
Sodium: 138 mmol/L (ref 135–145)

## 2020-06-11 LAB — HEPATITIS B DNA, ULTRAQUANTITATIVE, PCR
HBV DNA SERPL PCR-ACNC: NOT DETECTED IU/mL
HBV DNA SERPL PCR-LOG IU: UNDETERMINED log10 IU/mL

## 2020-06-11 LAB — SAVE SMEAR(SSMR), FOR PROVIDER SLIDE REVIEW

## 2020-06-11 LAB — RETICULOCYTES
Immature Retic Fract: 13.1 % (ref 2.3–15.9)
RBC.: 2.74 MIL/uL — ABNORMAL LOW (ref 4.22–5.81)
Retic Count, Absolute: 38.6 10*3/uL (ref 19.0–186.0)
Retic Ct Pct: 1.4 % (ref 0.4–3.1)

## 2020-06-11 LAB — CREATININE, URINE, RANDOM: Creatinine, Urine: 83.6 mg/dL

## 2020-06-11 LAB — VITAMIN B12: Vitamin B-12: 301 pg/mL (ref 180–914)

## 2020-06-11 LAB — HEPATITIS B E ANTIGEN: Hep B E Ag: NEGATIVE

## 2020-06-11 LAB — SODIUM, URINE, RANDOM: Sodium, Ur: 26 mmol/L

## 2020-06-11 IMAGING — DX DG CHEST 1V PORT
1 series · 1 of 1 positions shown · non-contrast
Comparison: Portable exam [LJ] hours compared to [DATE]

Correlation CT chest [DATE]

CLINICAL DATA: Bacteremia, fever, renal failure, endocarditis

EXAM:
PORTABLE CHEST 1 VIEW

[chest ap]
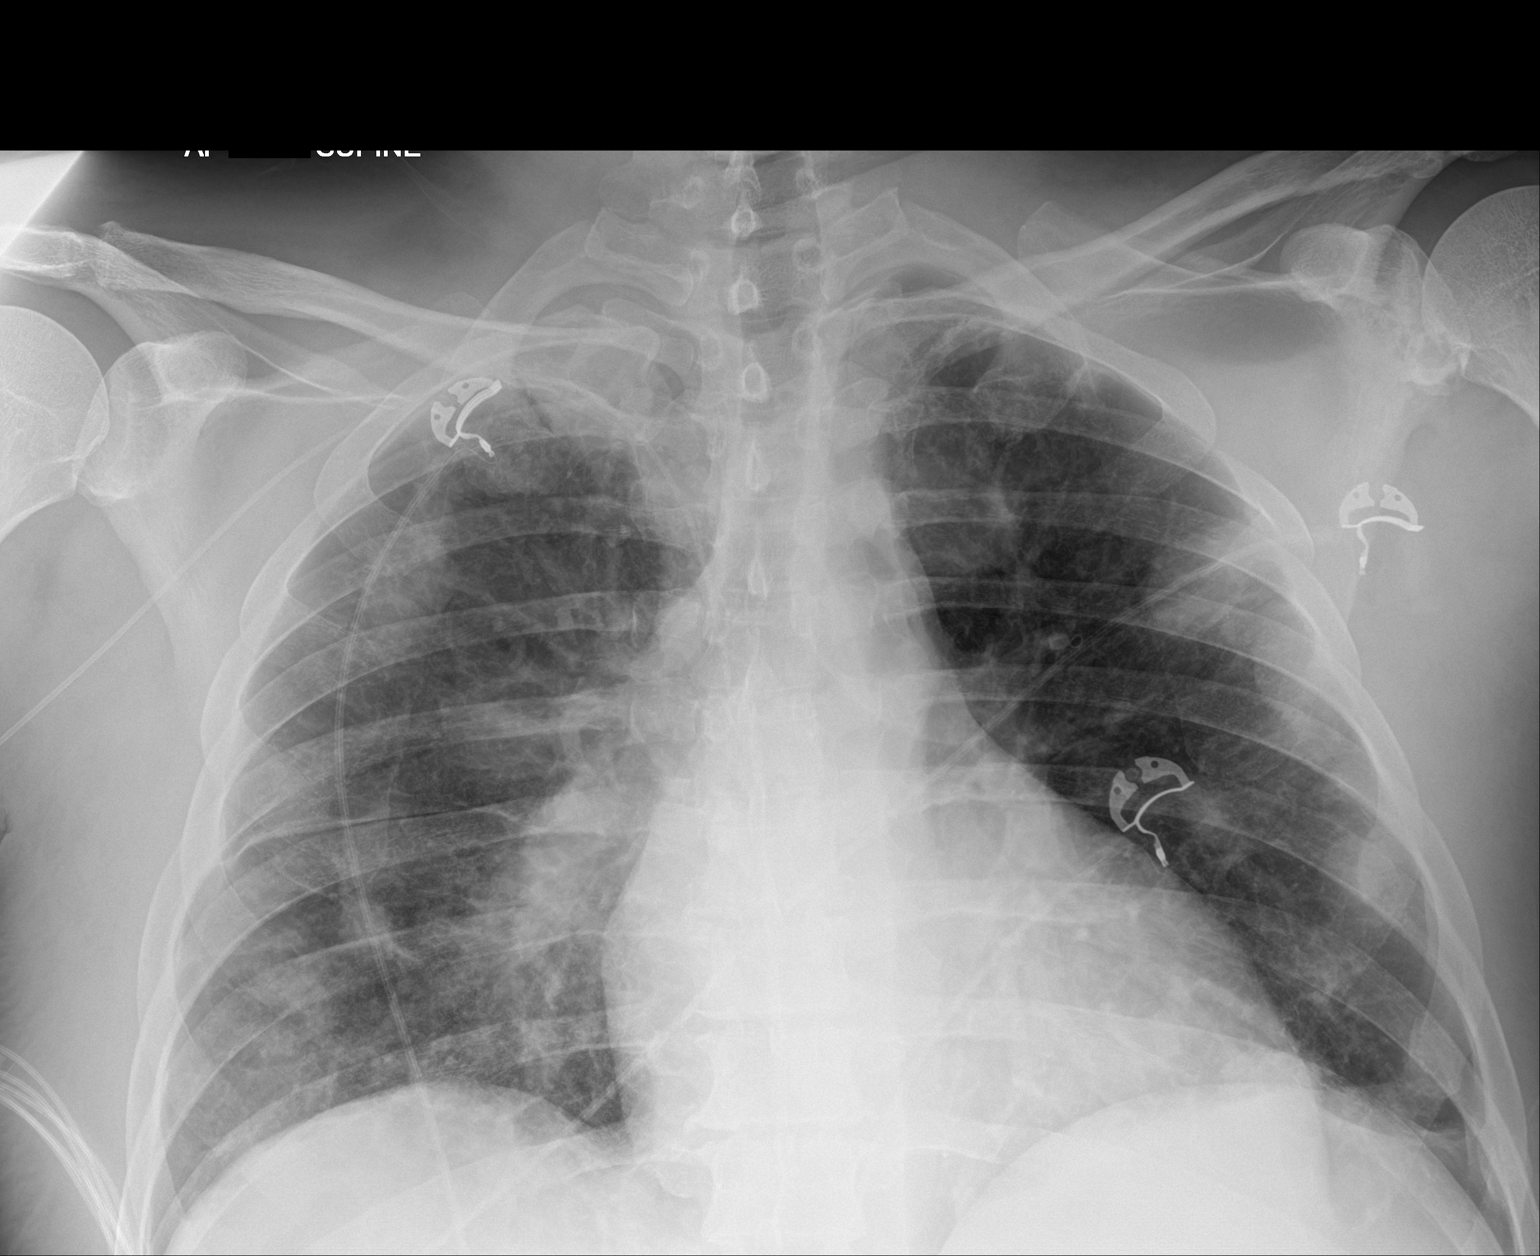

[1 of 1 positions shown; findings below may reference images not displayed]

FINDINGS: RIGHT arm PICC line tip projecting over SVC.

Normal heart size mediastinal contours.

Nodular opacities in both lungs, decreased in number since previous
exam.

Some of the nodular opacities are stable in size while others
demonstrate decreased.

None of the nodules appear cavitary on current exam.

Improved pulmonary infiltrates.

No pleural effusion or pneumothorax.
IMPRESSION: Decreased pulmonary infiltrates as well as sizes and number of
nodular opacities in both lungs consistent with improving septic
emboli.

## 2020-06-11 MED ORDER — HYDROMORPHONE HCL 1 MG/ML IJ SOLN: 1.0000 mg | INTRAMUSCULAR | Status: DC | PRN

## 2020-06-11 MED ORDER — GABAPENTIN 300 MG PO CAPS
600.0000 mg | ORAL_CAPSULE | Freq: Every day | ORAL | Status: DC
  Administered 2020-06-11 – 2020-06-14 (×4): 600 mg via ORAL
  Filled 2020-06-11 (×4): qty 2

## 2020-06-11 MED ORDER — ACETAMINOPHEN 325 MG PO TABS
325.0000 mg | ORAL_TABLET | Freq: Once | ORAL | Status: AC
  Administered 2020-06-11: 325 mg via ORAL
  Filled 2020-06-11: qty 1

## 2020-06-11 NOTE — Progress Notes (Signed)
Pt seen last night, slowly improving, stable R S1 distribution numbness, strength in BLE 4+/5, muscle spasms improving, some mild fullness of the wound but healing well. Likely edematous from repeat approaches through the same corridor versus seroma, no concerning features.

## 2020-06-11 NOTE — Progress Notes (Signed)
Subjective:   Hospital day: 21  Overnight event: Received 2 units of pRBC yesterday/overnight  This morning, patient states his pain is stable and better when he gets his pain meds. He is still restricted to the bed because of the back spasms. He tried to sit up to eat yesterday but was unable to tolerate more than a few minutes of sitting up. He denies any CP, SOB, cough, fever or chills. States he was told he had a fever by he did not feel hot.   Objective:  Vital signs in last 24 hours: Vitals:   06/11/20 0228 06/11/20 0413 06/11/20 0630 06/11/20 0925  BP: (!) 151/92 (!) 158/100  133/86  Pulse: 99 (!) 102    Resp: 16 16    Temp: (!) 100.9 F (38.3 C) (!) 101.6 F (38.7 C) 100 F (37.8 C)   TempSrc: Oral Oral    SpO2: 99% 93%    Weight:  88.5 kg    Height:        Filed Weights   06/09/20 0403 06/10/20 0637 06/11/20 0413  Weight: 85.9 kg 83.9 kg 88.5 kg     Intake/Output Summary (Last 24 hours) at 06/11/2020 0828 Last data filed at 06/11/2020 0554 Gross per 24 hour  Intake 1646.33 ml  Output 2375 ml  Net -728.67 ml   Net IO Since Admission: -22,057.45 mL [06/11/20 0700]  No results for input(s): GLUCAP in the last 72 hours.   Pertinent Labs: CBC Latest Ref Rng & Units 06/11/2020 06/10/2020 06/10/2020  WBC 4.0 - 10.5 K/uL 10.6(H) 8.2 -  Hemoglobin 13.0 - 17.0 g/dL 7.8(L) 6.7(LL) 6.5(LL)  Hematocrit 39.0 - 52.0 % 24.5(L) 21.1(L) 20.6(L)  Platelets 150 - 400 K/uL 219 189 -    CMP Latest Ref Rng & Units 06/11/2020 06/10/2020 06/09/2020  Glucose 70 - 99 mg/dL 630(Z) 601(U) 97  BUN 6 - 20 mg/dL 93(A) 35(T) 73(U)  Creatinine 0.61 - 1.24 mg/dL 2.02(R) 4.27(C) 6.23(J)  Sodium 135 - 145 mmol/L 138 138 139  Potassium 3.5 - 5.1 mmol/L 4.5 4.6 4.6  Chloride 98 - 111 mmol/L 103 103 106  CO2 22 - 32 mmol/L 25 26 26   Calcium 8.9 - 10.3 mg/dL 7.9(L) 8.1(L) 8.3(L)  Total Protein 6.5 - 8.1 g/dL - - -  Total Bilirubin 0.3 - 1.2 mg/dL - - -  Alkaline Phos 38 - 126 U/L - - -   AST 15 - 41 U/L - - -  ALT 0 - 44 U/L - - -    Imaging: No results found.  Physical Exam  General: Pleasant, middle-aged man laying a little uncomfortable in bed. NAD. CV: RRR. No m/r/g. Trace BLE edema Pulmonary: Lungs CTAB. Normal effort. No wheezing or rales. Abdominal: Soft, nontender, nondistended. Normal bowel sounds. MSK: Mildly tender small, raised lesion under surgical incision on back Extremities: 2+ distal pulses. Normal ROM. Skin: Warm and dry. No obvious rash or lesions. Neuro: A&Ox3. Moves all extremities. Decreased sensation of the SI dermatone of R foot.  Assessment/Plan: Steven Cortez is a 45 y.o. 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 Hospital course complicated by acute renal failure requiring HD and large epidural abscess s/p laminectomy x2 (1/29 and 2/11). Now on long course IV abx treatment and pain management.   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 Patient continues to have muscle spasms limiting mobility and activity. Spiked a fever of 101.6 overnight with mild leukocytosis. Patient denies any respiratory and urinary symptoms. CXR showed decreased pulmonary infiltrates, improved septic emboli and no evidence of pneumonia. Pending urinalysis to rule out UTI.  Likely secondary to ineffective treatment of his endocarditis but will monitor for now. Patient is currently afebrile.  --ID following, appreciate recs --Continue IV daptomycin  (2/11-4/8) --Follow-up UA --Follow-up repeat blood cultures --Continue Robaxin 750 mg TID, increase Gabapentin to 600 mg QHS --Continue Oxy 15 mg q4h, Tylenol 650 mg q6h, prn dilaudid 1 mg q4h --Start IV dilaudid 1 mg prn before PT/OT sessions --Continue heat pad  Anemia of chronic disease Iron Deficiency Anemia? Patient had a hemoglobin drop to 6.5 yesterday and received 2 units of pRBCs. He has had intermittent drops in hemoglobin since admission. Now s/p 6 units of pRBC transfusion since admission. No signs of GI bleed. Labs findings of low iron and high/normal ferritin will normally be consistent with AoCD however, ferritin as an inflammatory marker can also be elevated during an infectious process. Folate and B12 normal. DIC unlikely with normal platelet count. Aranesp order on 1/31 not given. Hgb improved to 7.8 this morning. Will consider iron transfusion if hgb continues to drop.  --F/u Erythropoietin levels --F/u blood smear --Pending afternoon CBC --Continue daily CBC  Acute renal failure His hospital course has been complicated by acute renal failure requiring dialysis x1. There has been intermittent improvements in renal function after diuresing however, kidney function has been worsening for the past 2 days (GFR 60>45>35). K+ stable at 4.5. He continues to have appropriate UOP and no urinary symptoms so doubt obstruction but will consider a urology evaluation if kidney function continues to worsen.  --F/u urinalysis and urine studies --Continue lokelma 5 g BID --Daily CMP --Strict I&Os, daily weights --Avoid nephrotoxic agents  Hypertension Patient's BP has been relatively uncontrolled during hospitalization. Amlodipine was increased from 5 mg to 10 mg yesterday. BP this morning improved to 133/86. Will continue to monitor.  --Continue Coreg 6.25 BID --Continue Amlodipine 10 mg daily --Daily vitals  Hypalbuminemia S/p IV albumin x3 doses over the last 4 days. LE edema  much improved this morning. Albumin still low but improving (1.9>>2.1). Will continue to monitor for now. --Continue TED hose --Daily RFP  Diet: Renal diet IVF: None VTE: Lovenox CODE: Full Code  Prior to Admission Living Arrangement: Home Anticipated Discharge Location: Home w/ HH Barriers to Discharge: Medical work up Dispo: Anticipated discharge in approximately 2-3 day(s).   Signed: Steffanie Rainwater, MD 06/11/2020, 7:00 AM  Pager: [phone] Internal Medicine Teaching Service After 5pm on weekdays and 1pm on weekends: On Call pager: [phone]

## 2020-06-11 NOTE — Progress Notes (Signed)
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 Leukocytosis of 10.6 The swelling at the lunar wound seems to be stable in size with minimal to no drainage in the last 24 hrs per RN   Assessment  Fevers Lumbar surgical site swelling ( seroma vs hematoma)- seen by Neuro sx , plannned to watch for now  Disseminated MRSA bacteremia TV Endocarditis c/b septic pulmonary emboli  Epidural abscess/discitis and L4-L5 discitis/Osteomyelitis/Septic arthritis s/p laminectomy on 1/29 and laminectomy and evacuation of epidural phlegmon on 2/11. OR cultures growing MRSA  Prior Hepatitis B - Positive Hep B surface antibody and Hep B core antobody. Hep B surface ag NR, Hep B E ag NR, hep B E ab NR, HB DNA negative  HCV ab reactive, HCV RNA negative   Recommendations Continue Daptomycin, monitor CPK Blood cx ( peripheral and central) Monitor for lumbar wound surgical site for increased drainage/swelling  Monitor fever curve  Neuro SX following   Rest of the management as per the primary team. Thank you for the consult. Please page with pertinent questions or concerns.  ______________________________________________________________________ Subjective patient seen and examined at the  bedside. He did not feel having fevers, says back pain is stable, back spasms are getting better. Denies cough, chest pain, SOB. Denies nausea, vomiting, diarrhea. Denies GU symptoms. Denies pain and tenderness in the PICC line site    Vitals BP 133/86   Pulse (!) 102   Temp 100 F (37.8 C)   Resp 16   Ht 5\' 10"  (1.778 m)   Wt 88.5 kg   SpO2 93%   BMI 27.99 kg/m     Physical Exam Lying in bed and appears to be little more comfortable than before Chest - clear bilaterally CVS- Normal s1s2, RRR Abdomen - soft, NT Extremities power 4/5 in both legs Back - lumbar peri-incisional swelling - no warmth, tenderness, no soakage in bandage, same size as yesterday    Pertinent Microbiology Results for orders placed or performed during the hospital encounter of 05/21/20  SARS Coronavirus 2 by RT PCR (hospital order, performed in [facility] hospital lab) Nasopharyngeal Nasopharyngeal Swab     Status: None   Collection Time: 05/21/20  8:45 AM   Specimen: Nasopharyngeal Swab  Result Value Ref Range Status   SARS Coronavirus 2 NEGATIVE NEGATIVE Final    Comment: (NOTE) SARS-CoV-2 target nucleic acids are NOT DETECTED.  The SARS-CoV-2 RNA is generally detectable in upper and lower respiratory specimens during the acute phase of infection. The lowest concentration of SARS-CoV-2 viral copies this assay can detect is 250 copies / mL. A negative result does not preclude SARS-CoV-2 infection and should not be used as the sole basis for treatment or other patient management decisions.  A negative result may occur with improper specimen collection / handling, submission of specimen other  than nasopharyngeal swab, presence of viral mutation(s) within the areas targeted by this assay, and inadequate number of viral copies (<250 copies / mL). A negative result must be combined with clinical observations, patient history, and epidemiological information.  Fact Sheet for Patients:    BoilerBrush.com.cy  Fact Sheet for Healthcare Providers: https://pope.com/  This test is not yet approved or  cleared by the [place] FDA and has been authorized for detection and/or diagnosis of SARS-CoV-2 by FDA under an Emergency Use Authorization (EUA).  This EUA will remain in effect (meaning this test can be used) for the duration of the COVID-19 declaration under Section 564(b)(1) of the Act, 21 U.S.C. section 360bbb-3(b)(1), unless the authorization is terminated or revoked sooner.  Performed at [facility], [address], [place], [place] [address]   Blood culture (routine x 2)     Status: None   Collection Time: 05/21/20  9:18 AM   Specimen: BLOOD  Result Value Ref Range Status   Specimen Description BLOOD SITE NOT SPECIFIED  Final   Special Requests   Final    BOTTLES DRAWN AEROBIC ONLY Blood Culture results may not be optimal due to an inadequate volume of blood received in culture bottles   Culture   Final    NO GROWTH 5 DAYS Performed at [facility], [address], [place], [place] [address]    Report Status 05/26/2020 FINAL  Final  Surgical pcr screen     Status: Abnormal   Collection Time: 05/23/20  7:26 AM   Specimen: Nasal Mucosa; Nasal Swab  Result Value Ref Range Status   MRSA, PCR POSITIVE (A) NEGATIVE Final    Comment: CRITICAL RESULT CALLED TO, READ BACK BY AND VERIFIED WITH: TINA ISSACS AT 1022 1/29 BY MM    Staphylococcus aureus POSITIVE (A) NEGATIVE Final    Comment: (NOTE) The Xpert SA Assay (FDA approved for NASAL specimens in patients 48 years of age and older), is one component of a comprehensive surveillance program. It is not intended to diagnose infection nor to guide or monitor treatment. Performed at [facility], [address] [place], [place] [address]   Aerobic/Anaerobic Culture (surgical/deep wound)     Status: None   Collection Time: 05/23/20  9:07 AM    Specimen: Abscess  Result Value Ref Range Status   Specimen Description ABSCESS  Final   Special Requests LUMBAR EPIDURAL SPEC A  Final   Gram Stain   Final    RARE WBC PRESENT,BOTH PMN AND MONONUCLEAR NO ORGANISMS SEEN    Culture   Final    FEW METHICILLIN RESISTANT STAPHYLOCOCCUS AUREUS NO ANAEROBES ISOLATED Performed at [facility], [address] [place], [place] [address]    Report Status 05/28/2020 FINAL  Final   Organism ID, Bacteria METHICILLIN RESISTANT STAPHYLOCOCCUS AUREUS  Final      Susceptibility   Methicillin resistant staphylococcus aureus - MIC*    CIPROFLOXACIN >=8 RESISTANT Resistant     ERYTHROMYCIN >=8 RESISTANT Resistant     GENTAMICIN <=0.5 SENSITIVE Sensitive     OXACILLIN >=4 RESISTANT Resistant     TETRACYCLINE <=1 SENSITIVE Sensitive     VANCOMYCIN <=0.5 SENSITIVE Sensitive     TRIMETH/SULFA <=10 SENSITIVE Sensitive     CLINDAMYCIN <=0.25 SENSITIVE Sensitive     RIFAMPIN <=0.5 SENSITIVE Sensitive     Inducible Clindamycin NEGATIVE Sensitive     * FEW METHICILLIN RESISTANT STAPHYLOCOCCUS AUREUS     Pertinent Lab. CBC  Latest Ref Rng & Units 06/11/2020 06/10/2020 06/10/2020  WBC 4.0 - 10.5 K/uL 10.6(H) 8.2 -  Hemoglobin 13.0 - 17.0 g/dL 7.8(L) 6.7(LL) 6.5(LL)  Hematocrit 39.0 - 52.0 % 24.5(L) 21.1(L) 20.6(L)  Platelets 150 - 400 K/uL 219 189 -   CMP Latest Ref Rng & Units 06/11/2020 06/10/2020 06/09/2020  Glucose 70 - 99 mg/dL 258(N) 277(O) 97  BUN 6 - 20 mg/dL 24(M) 35(T) 61(W)  Creatinine 0.61 - 1.24 mg/dL 4.31(V) 4.00(Q) 6.76(P)  Sodium 135 - 145 mmol/L 138 138 139  Potassium 3.5 - 5.1 mmol/L 4.5 4.6 4.6  Chloride 98 - 111 mmol/L 103 103 106  CO2 22 - 32 mmol/L 25 26 26   Calcium 8.9 - 10.3 mg/dL 7.9(L) 8.1(L) 8.3(L)  Total Protein 6.5 - 8.1 g/dL - - -  Total Bilirubin 0.3 - 1.2 mg/dL - - -  Alkaline Phos 38 - 126 U/L - - -  AST 15 - 41 U/L - - -  ALT 0 - 44 U/L - - -     Pertinent Imaging today Plain films and CT images have been  personally visualized and interpreted; radiology reports have been reviewed. Decision making incorporated into the Impression / Recommendations.  I have spent approx 30 minutes for this patient encounter including review of prior medical records with greater than 50% of time being face to face and coordination of their care.  Electronically signed by:   , MD Infectious Disease Physician [facility] for Infectious Disease Pager: [phone]

## 2020-06-11 NOTE — Plan of Care (Signed)
°  Problem: Coping: °Goal: Level of anxiety will decrease °Outcome: Progressing °  °

## 2020-06-11 NOTE — Progress Notes (Signed)
Occupational Therapy Treatment Patient Details Name: Steven Cortez MRN: [id number] DOB: 1975-08-17 Today's Date: 06/11/2020    History of present illness Pt is a 45 year old male with PMHx including Hep C and IVDU who presented with back pain, weakness, dyspnea, pleuritic chest pain, urinary retention, and pitting edema. He was recently admitted to [facility] and found to have tricuspid endocarditis with MRSA bacteremia, and pulmonary septic emboli but left AMA. 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 s/pTEE 05/28/20.   2/11 repeat evacuation / extension of decompression   OT comments  Pt currently struggling to progress with acute OT goals, goals downgraded this session and d/c recommendation updated. Pt limited by 10/10 FACEs back pain at rest, BLE spasms, difficulty controlling gross BLE movements( spasms vs decreased gross motor control?) Pt received in supine. Able to slowly and with great effort complete bed mobility with up to min A needed. Only able to tolerate about 20 seconds sitting EOB with BUE support. Spasms also noted in BUE in weightbearing position. Feel like we need to consider any options he may have for assistance at d/c. Updated d/c recommendation to below.    Follow Up Recommendations  Supervision/Assistance - 24 hour;SNF    Equipment Recommendations       Recommendations for Other Services      Precautions / Restrictions Precautions Precautions: Fall;Back Restrictions Weight Bearing Restrictions: No       Mobility Bed Mobility Overal bed mobility: Needs Assistance Bed Mobility: Rolling;Sidelying to Sit;Sit to Supine Rolling: Min guard Sidelying to sit: Min assist   Sit to supine: Min assist;HOB elevated   General bed mobility comments: Assist given to control end movements of rolling to his left. Assist to block RLE  and R hip as pt's RLE continuing to roll anteriorly once in sidelying, decreased motor control? Min A to powerup trunk to EOB position. For return to supine HOB elevated and assist given to power BLE fully onto bed 2/2 increased pain and fatigue "I'm going, I'm laying back down. I'm sorry."  Transfers                 General transfer comment: unable d/t increased pain EOB. Only able to sit EOB about 20 seconds this session    Balance Overall balance assessment: Needs assistance Sitting-balance support: Bilateral upper extremity supported;Feet supported Sitting balance-Leahy Scale: Fair Sitting balance - Comments: BUE support as position of comfort due to pain.                                   ADL either performed or assessed with clinical judgement   ADL Overall ADL's : Needs assistance/impaired   Eating/Feeding Details (indicate cue type and reason): pt needing to be in supine for pain relief. Pt reports he is currently only able to sit up for a bite or two at a time before needing to return to supine                                   General ADL Comments: Session limited by 10/10 Faces pain, premedicated, at rest. Bed mobility with up to min A. Only able to tolerate EOB for about 20 seconds, BUE shakiness in addition to BLE shakiness in this  position.     Vision       Perception     Praxis      Cognition Arousal/Alertness: Awake/alert Behavior During Therapy: WFL for tasks assessed/performed Overall Cognitive Status: Within Functional Limits for tasks assessed                                 General Comments: Internally distracted by pain.        Exercises Other Exercises Other Exercises: Pt requested and completed bed level BLE exercises prior to bed mobility. AAROM/PROM plantar flexion/dorsiflexion, BLE raises with light resistance,  AAROM exercises L hip flexion   Shoulder Instructions       General Comments       Pertinent Vitals/ Pain       Pain Assessment: Faces Faces Pain Scale: Hurts worst Pain Location: low back, BLE spasms Pain Descriptors / Indicators: Moaning;Guarding;Grimacing;Spasm;Sharp Pain Intervention(s): Premedicated before session;Limited activity within patient's tolerance;Monitored during session;Repositioned;Heat applied  Home Living                                          Prior Functioning/Environment              Frequency  Min 2X/week        Progress Toward Goals  OT Goals(current goals can now be found in the care plan section)  Progress towards OT goals: Not progressing toward goals - comment;Goals drowngraded-see care plan (back pain and BLE spasms at rest necessitating increased assist with ADLs.)  Acute Rehab OT Goals Patient Stated Goal: home, back to independent OT Goal Formulation: With patient Time For Goal Achievement: 06/25/20 Potential to Achieve Goals: Good ADL Goals Pt Will Perform Grooming: sitting;with supervision Pt Will Perform Lower Body Bathing: sit to/from stand;with adaptive equipment;with min guard assist Pt Will Perform Upper Body Dressing: with modified independence;sitting Pt Will Perform Lower Body Dressing: sit to/from stand;with adaptive equipment;with min guard assist Pt Will Transfer to Toilet: with modified independence;ambulating;with min guard assist Pt Will Perform Toileting - Clothing Manipulation and hygiene: with modified independence;sit to/from stand;with min guard assist Additional ADL Goal #1: Pt will complete bed mobility at min guard level to prepare for EOB ADLs  Plan Discharge plan needs to be updated    Co-evaluation                 AM-PAC OT "6 Clicks" Daily Activity     Outcome Measure   Help from another person eating meals?: A Little Help from another person taking care of personal grooming?: A Little Help from another person toileting, which includes using toliet, bedpan,  or urinal?: A Lot Help from another person bathing (including washing, rinsing, drying)?: A Lot Help from another person to put on and taking off regular upper body clothing?: A Lot Help from another person to put on and taking off regular lower body clothing?: A Lot 6 Click Score: 14    End of Session    OT Visit Diagnosis: Other abnormalities of gait and mobility (R26.89);Muscle weakness (generalized) (M62.81);Pain   Activity Tolerance Patient limited by pain;Patient limited by fatigue (BLE spasms at rest, BUE spasms when using to support self EOB)   Patient Left in bed;with call bell/phone within reach;with bed alarm set   Nurse Communication Mobility status;Other (comment) (appears to have significantly regressed compared  to this OT's session with him a week ago.)        Time: 1497-0263 OT Time Calculation (min): 18 min  Charges: OT General Charges $OT Visit: 1 Visit OT Treatments $Self Care/Home Management : 8-22 mins  Raynald Kemp, OT Acute Rehabilitation Services Pager: [phone] Office: [phone]    Pilar Grammes 06/11/2020, 1:58 PM

## 2020-06-11 NOTE — Progress Notes (Signed)
Pt. Temp 101.6. On call for IMTS paged to make aware.

## 2020-06-12 ENCOUNTER — Inpatient Hospital Stay (HOSPITAL_COMMUNITY): Payer: Medicaid Other

## 2020-06-12 LAB — HEPATIC FUNCTION PANEL
ALT: 32 U/L (ref 0–44)
AST: 25 U/L (ref 15–41)
Albumin: 2 g/dL — ABNORMAL LOW (ref 3.5–5.0)
Alkaline Phosphatase: 260 U/L — ABNORMAL HIGH (ref 38–126)
Bilirubin, Direct: 0.1 mg/dL (ref 0.0–0.2)
Indirect Bilirubin: 1 mg/dL — ABNORMAL HIGH (ref 0.3–0.9)
Total Bilirubin: 1.1 mg/dL (ref 0.3–1.2)
Total Protein: 6.5 g/dL (ref 6.5–8.1)

## 2020-06-12 LAB — CBC WITH DIFFERENTIAL/PLATELET
Abs Immature Granulocytes: 0.07 10*3/uL (ref 0.00–0.07)
Basophils Absolute: 0 10*3/uL (ref 0.0–0.1)
Basophils Relative: 0 %
Eosinophils Absolute: 0 10*3/uL (ref 0.0–0.5)
Eosinophils Relative: 0 %
HCT: 24.3 % — ABNORMAL LOW (ref 39.0–52.0)
Hemoglobin: 7.6 g/dL — ABNORMAL LOW (ref 13.0–17.0)
Immature Granulocytes: 1 %
Lymphocytes Relative: 20 %
Lymphs Abs: 1.8 10*3/uL (ref 0.7–4.0)
MCH: 28.4 pg (ref 26.0–34.0)
MCHC: 31.3 g/dL (ref 30.0–36.0)
MCV: 90.7 fL (ref 80.0–100.0)
Monocytes Absolute: 1.1 10*3/uL — ABNORMAL HIGH (ref 0.1–1.0)
Monocytes Relative: 13 %
Neutro Abs: 5.6 10*3/uL (ref 1.7–7.7)
Neutrophils Relative %: 66 %
Platelets: 225 10*3/uL (ref 150–400)
RBC: 2.68 MIL/uL — ABNORMAL LOW (ref 4.22–5.81)
RDW: 15.8 % — ABNORMAL HIGH (ref 11.5–15.5)
WBC: 8.6 10*3/uL (ref 4.0–10.5)
nRBC: 0 % (ref 0.0–0.2)

## 2020-06-12 LAB — LIPID PANEL
Cholesterol: 89 mg/dL (ref 0–200)
HDL: 22 mg/dL — ABNORMAL LOW (ref >40)
LDL Cholesterol: 51 mg/dL (ref 0–99)
Total CHOL/HDL Ratio: 4 RATIO
Triglycerides: 82 mg/dL (ref <150)
VLDL: 16 mg/dL (ref 0–40)

## 2020-06-12 LAB — RENAL FUNCTION PANEL
Albumin: 2 g/dL — ABNORMAL LOW (ref 3.5–5.0)
Anion gap: 9 (ref 5–15)
BUN: 64 mg/dL — ABNORMAL HIGH (ref 6–20)
CO2: 25 mmol/L (ref 22–32)
Calcium: 7.9 mg/dL — ABNORMAL LOW (ref 8.9–10.3)
Chloride: 104 mmol/L (ref 98–111)
Creatinine, Ser: 2.89 mg/dL — ABNORMAL HIGH (ref 0.61–1.24)
GFR, Estimated: 27 mL/min — ABNORMAL LOW (ref >60)
Glucose, Bld: 99 mg/dL (ref 70–99)
Phosphorus: 5.6 mg/dL — ABNORMAL HIGH (ref 2.5–4.6)
Potassium: 4.4 mmol/L (ref 3.5–5.1)
Sodium: 138 mmol/L (ref 135–145)

## 2020-06-12 LAB — PROTEIN / CREATININE RATIO, URINE
Creatinine, Urine: 84.08 mg/dL
Protein Creatinine Ratio: 7.2 mg/mg{Cre} — ABNORMAL HIGH (ref 0.00–0.15)
Total Protein, Urine: 605 mg/dL

## 2020-06-12 LAB — ERYTHROPOIETIN: Erythropoietin: 23.2 m[IU]/mL — ABNORMAL HIGH (ref 2.6–18.5)

## 2020-06-12 LAB — PATHOLOGIST SMEAR REVIEW

## 2020-06-12 IMAGING — US US RENAL
1 series · 14 of 25 positions shown · non-contrast
Comparison: CT [DATE], ultrasound [DATE]

CLINICAL DATA: Acute renal failure

EXAM:
RENAL / URINARY TRACT ULTRASOUND COMPLETE

[Series 1: us renal · 14 of 45 slices shown]
[im 1/45]
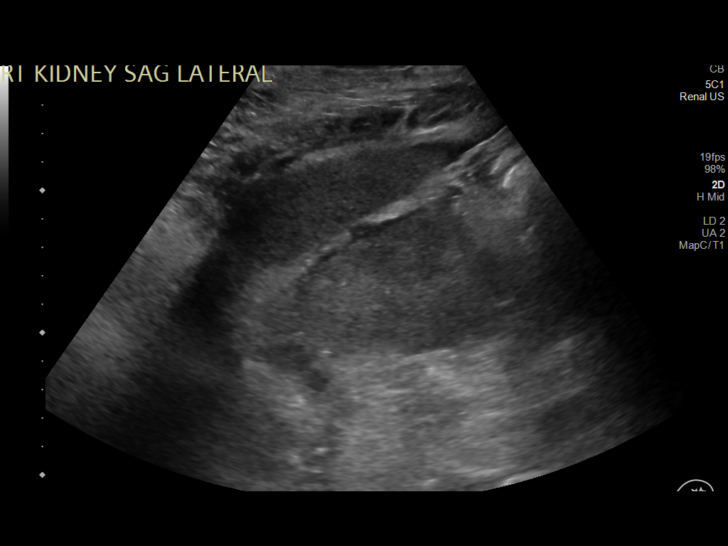
[im 4/45]
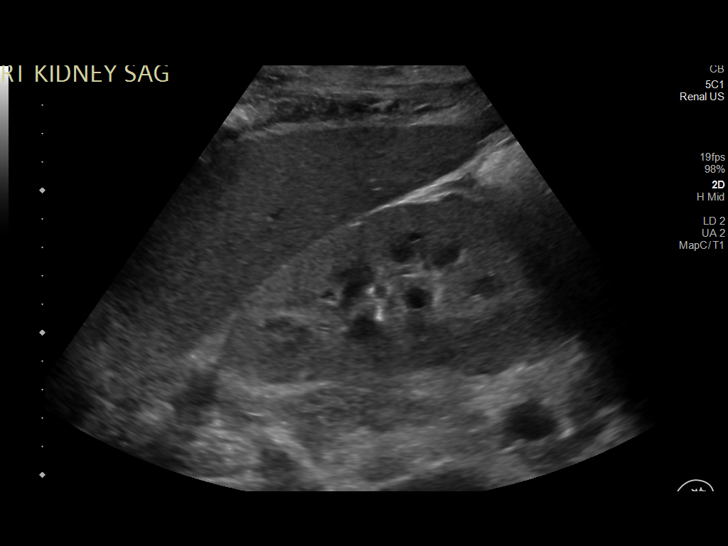
[im 8/45]
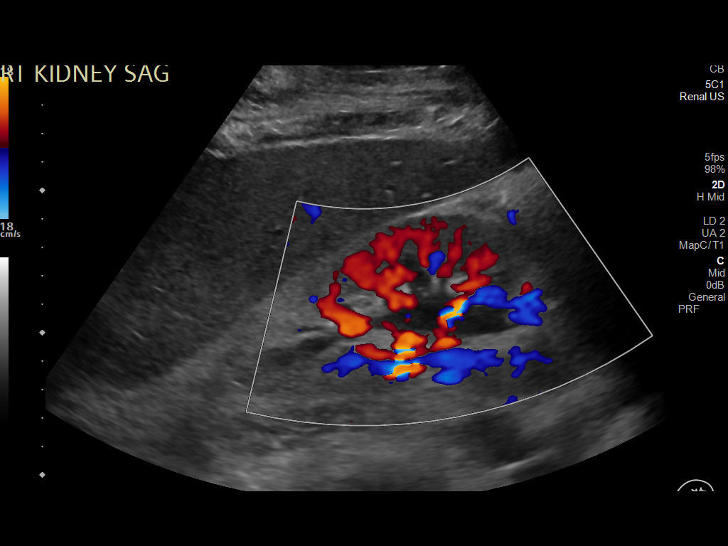
[im 12/45]
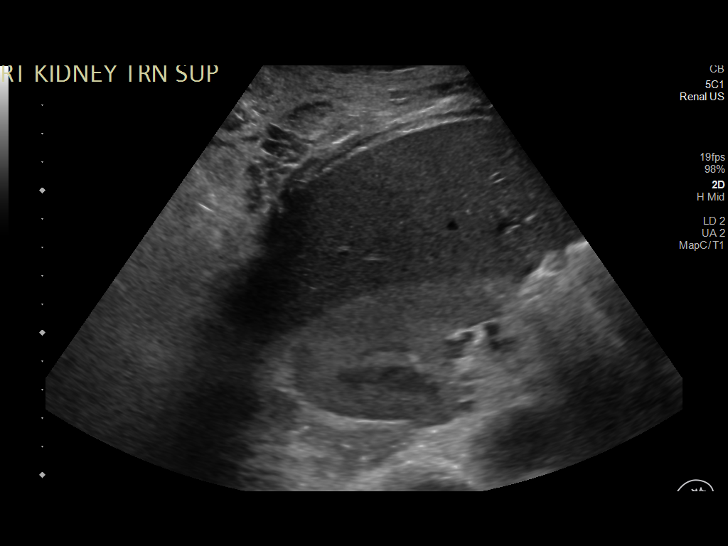
[im 15/45]
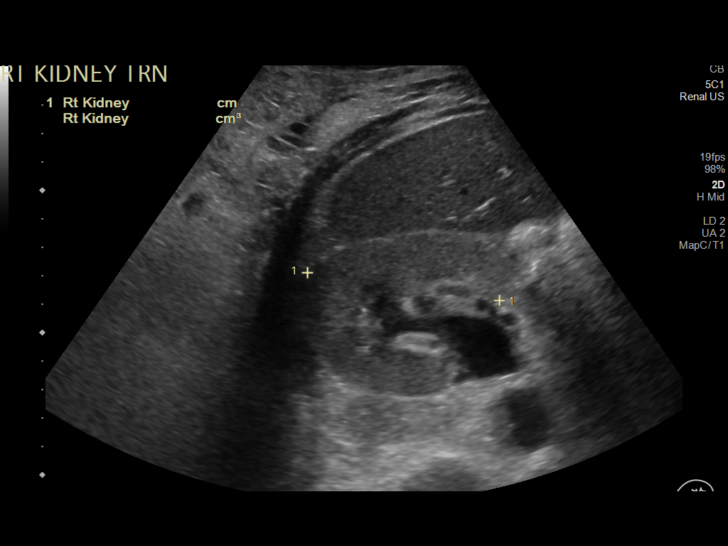
[im 17/45]
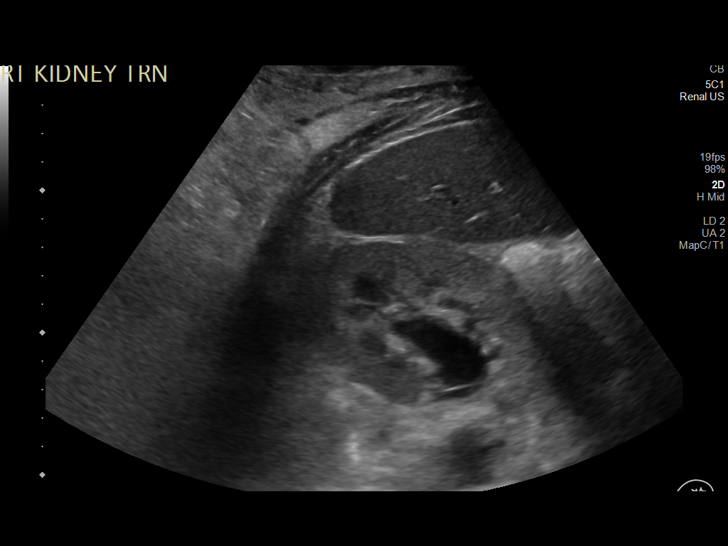
[im 21/45]
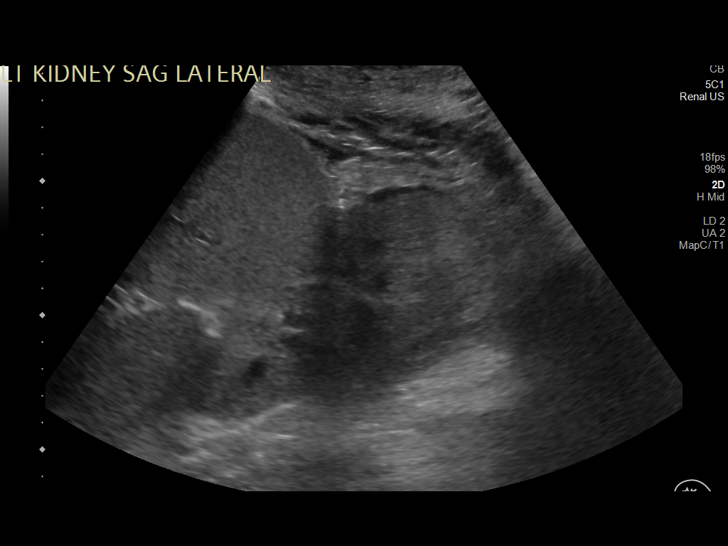
[im 24/45]
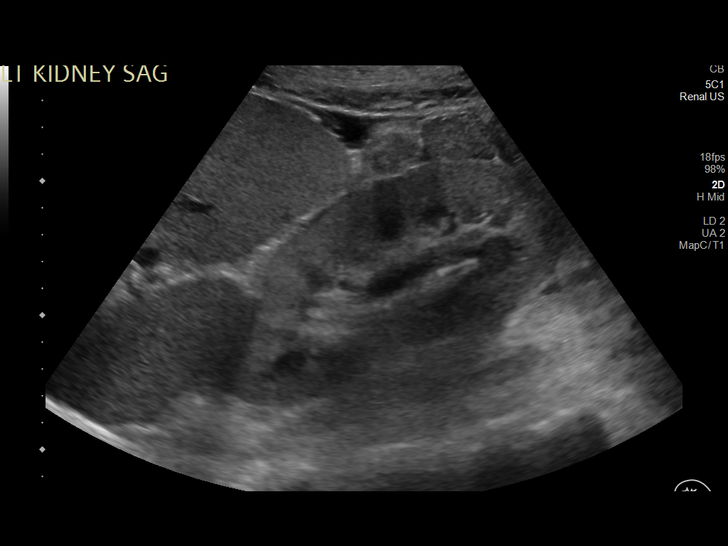
[im 28/45]
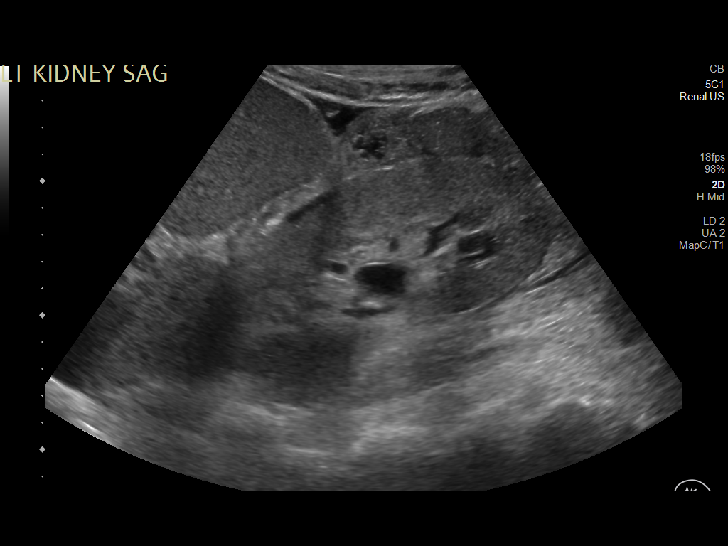
[im 30/45]
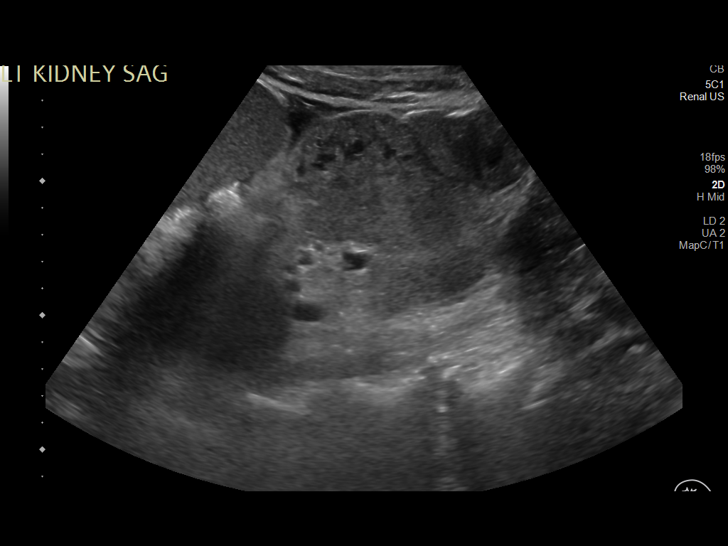
[im 34/45]
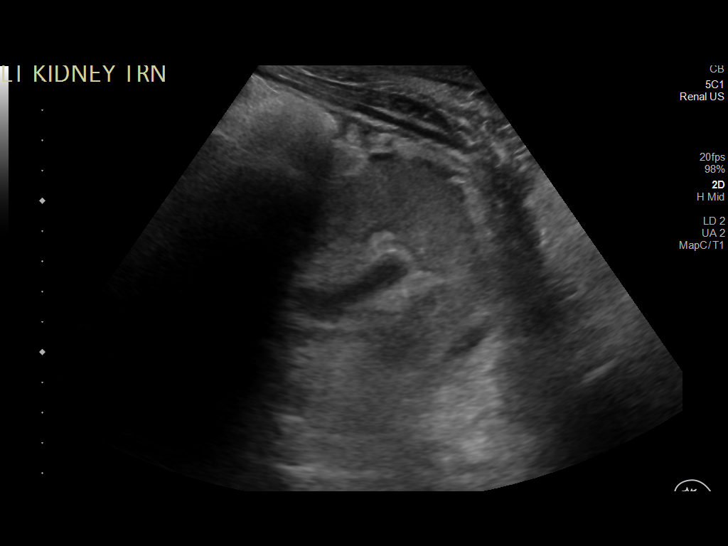
[im 37/45]
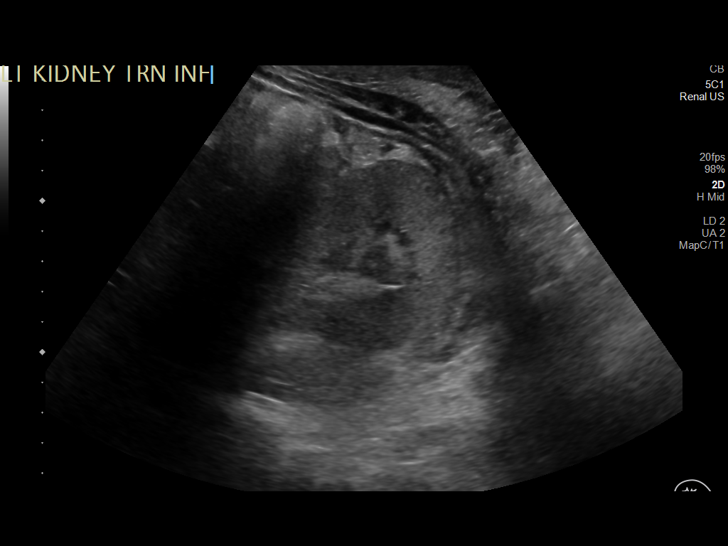
[im 41/45]
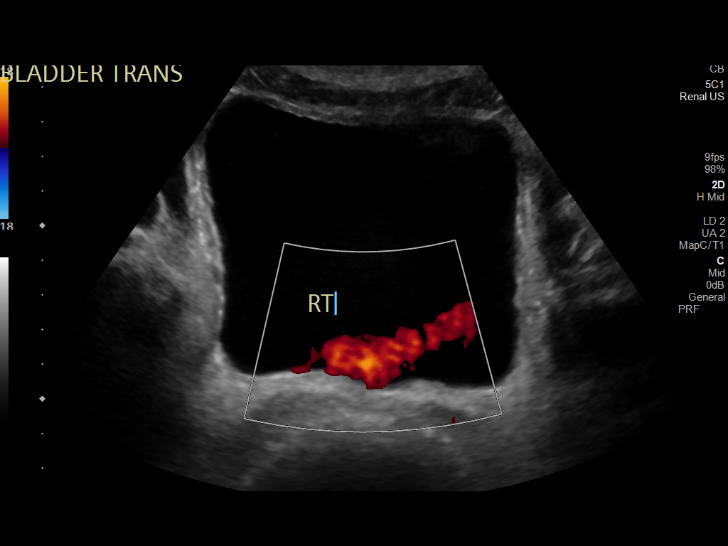
[im 45/45]
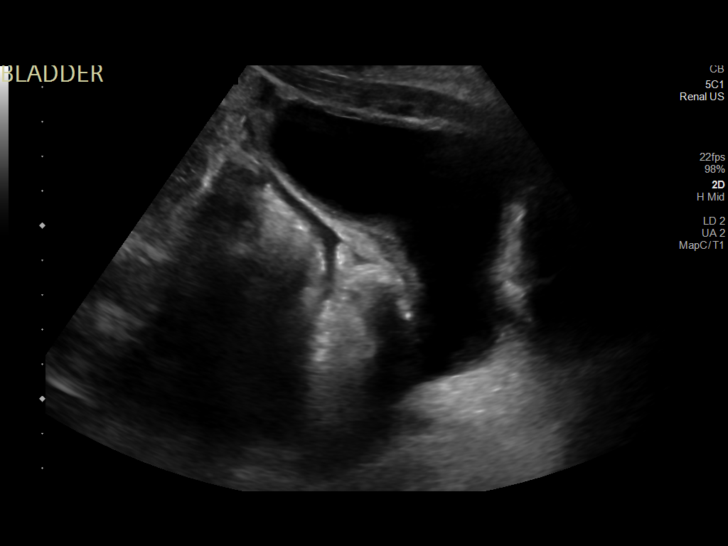

[14 of 25 positions shown; findings below may reference images not displayed]

FINDINGS: Right Kidney:

Renal measurements: 13.1 x 5.9 x 6.8 cm = volume: 275 mL. Increased
renal cortical echogenicity. Mild to moderate hydronephrosis. No
concerning renal mass or shadowing calculus. Trace perinephric free
fluid.

Left Kidney:

Renal measurements: 14.2 x 6.3 x 5.4 = volume: 252 mL. Increased
renal cortical echogenicity. Mild hydronephrosis. No concerning
renal mass or shadowing calculus. Trace perinephric free fluid.

Bladder:

Normal for degree of distention.  Bilateral bladder jets identified.

Other:

Trace free fluid in the pelvis. Technically challenging exam due to
patient motion artifact related to reported back spasm.
IMPRESSION: 1. Mild to moderate right and mild left hydronephrosis.
2. Increased renal cortical echogenicity compatible with medical
renal disease.
3. Trace perinephric and pelvic free fluid, nonspecific.
4. Small volume intraperitoneal ascites as well.

## 2020-06-12 MED ORDER — ALBUMIN HUMAN 25 % IV SOLN
25.0000 g | Freq: Once | INTRAVENOUS | Status: AC
  Administered 2020-06-12: 25 g via INTRAVENOUS
  Filled 2020-06-12: qty 100

## 2020-06-12 MED ORDER — SODIUM CHLORIDE 0.9 % IV BOLUS
500.0000 mL | Freq: Once | INTRAVENOUS | Status: AC
  Administered 2020-06-12: 500 mL via INTRAVENOUS

## 2020-06-12 MED ORDER — ACETAMINOPHEN 325 MG PO TABS
650.0000 mg | ORAL_TABLET | ORAL | Status: DC | PRN
  Administered 2020-06-13 – 2020-06-18 (×3): 650 mg via ORAL
  Filled 2020-06-12 (×3): qty 2

## 2020-06-12 MED ORDER — ALTEPLASE 2 MG IJ SOLR
2.0000 mg | Freq: Once | INTRAMUSCULAR | Status: DC
  Filled 2020-06-12: qty 2

## 2020-06-12 MED ORDER — ALTEPLASE 2 MG IJ SOLR
2.0000 mg | Freq: Once | INTRAMUSCULAR | Status: AC
  Administered 2020-06-12: 2 mg
  Filled 2020-06-12 (×2): qty 2

## 2020-06-12 NOTE — Progress Notes (Addendum)
Subjective:   Hospital day: 21  Overnight event: No acute events overnight.   This morning, patient states am. back spasms have improved slightly but he continues to have pain that is well managed by pain medicines when he gets it. He denies any fever, chills, chest pain, shortness of breath, abdominal pain or dysuria.  Objective:  Vital signs in last 24 hours: Vitals:   06/11/20 1641 06/11/20 2124 06/12/20 0429 06/12/20 0820  BP: (!) 145/84 (!) 144/82 138/83 (!) 141/80  Pulse: 100 88 96 92  Resp:  12 16   Temp:  98.5 F (36.9 C) 100.2 F (37.9 C)   TempSrc: Oral Oral Oral   SpO2: 93% 97% 90% 93%  Weight:   88.9 kg   Height:        Filed Weights   06/10/20 0637 06/11/20 0413 06/12/20 0429  Weight: 83.9 kg 88.5 kg 88.9 kg     Intake/Output Summary (Last 24 hours) at 06/12/2020 0651 Last data filed at 06/12/2020 0400 Gross per 24 hour  Intake 760 ml  Output 900 ml  Net -140 ml   Net IO Since Admission: -22,197.45 mL [06/12/20 0651]  No results for input(s): GLUCAP in the last 72 hours.   Pertinent Labs: CBC Latest Ref Rng & Units 06/12/2020 06/11/2020 06/11/2020  WBC 4.0 - 10.5 K/uL 8.6 10.6(H) 10.6(H)  Hemoglobin 13.0 - 17.0 g/dL 7.6(L) 8.1(L) 7.8(L)  Hematocrit 39.0 - 52.0 % 24.3(L) 25.7(L) 24.5(L)  Platelets 150 - 400 K/uL 225 234 219    CMP Latest Ref Rng & Units 06/12/2020 06/11/2020 06/10/2020  Glucose 70 - 99 mg/dL 99 716(R) 678(L)  BUN 6 - 20 mg/dL 38(B) 01(B) 51(W)  Creatinine 0.61 - 1.24 mg/dL 2.58(N) 2.77(O) 2.42(P)  Sodium 135 - 145 mmol/L 138 138 138  Potassium 3.5 - 5.1 mmol/L 4.4 4.5 4.6  Chloride 98 - 111 mmol/L 104 103 103  CO2 22 - 32 mmol/L 25 25 26   Calcium 8.9 - 10.3 mg/dL 7.9(L) 7.9(L) 8.1(L)  Total Protein 6.5 - 8.1 g/dL 6.5 - -  Total Bilirubin 0.3 - 1.2 mg/dL 1.1 - -  Alkaline Phos 38 - 126 U/L 260(H) - -  AST 15 - 41 U/L 25 - -  ALT 0 - 44 U/L 32 - -    Imaging: DG CHEST PORT 1 VIEW  Result Date: 06/11/2020 CLINICAL DATA:   Bacteremia, fever, renal failure, endocarditis EXAM: PORTABLE CHEST 1 VIEW COMPARISON:  Portable exam 0914 hours compared to 05/21/2020 Correlation CT chest 05/18/2020 FINDINGS: RIGHT arm PICC line tip projecting over SVC. Normal heart size mediastinal contours. Nodular opacities in both lungs, decreased in number since previous exam. Some of the nodular opacities are stable in size while others demonstrate decreased. None of the nodules appear cavitary on current exam. Improved pulmonary infiltrates. No pleural effusion or pneumothorax. IMPRESSION: Decreased pulmonary infiltrates as well as sizes and number of nodular opacities in both lungs consistent with improving septic emboli. Electronically Signed   By: 05/20/2020 M.D.   On: 06/11/2020 10:57    Physical Exam  General: Middle-aged man laying in bed. NAD CV: RRR. Trace BLE edema.  Pulmonary: Normal effort. Lungs CTAB Abdominal: Soft. NT/ND.  MSK: Back incisor with dry dressing, no draining. Extremities: 2+ distal pulses. Neuro: A&Ox3. Moves all extremities  Assessment/Plan: Joshawa Dubin is a 45 y.o. 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 Hospital course complicated  by acute renal failure requiring HD and large epidural abscess s/p laminectomy x2 (1/29 and 2/11). Now on long course IV abx treatment and pain management.   Principal Problem:   Endocarditis Active Problems:   Urinary retention   AKI (acute kidney injury) (HCC)   Hyperkalemia   Microcytic anemia   MRSA infection   Epidural abscess   Opioid use disorder   Hydronephrosis   Malnutrition of moderate degree   Antibiotic long-term use   Chronic viral hepatitis B without delta agent and without coma (HCC)  MRSA tricuspid endocarditis Complicated by septic emboli and epidural abscess s/p I&D w/ laminectomy (x2).  Patient is clinically unchanged from yesterday. Fever curve trending  down patient currently on scheduled Tylenol every 6 hours. Repeat blood cultures pending. Per ID, patient will need imaging of back if he continues to be feverish overnight. --ID following, appreciate recs --F/u Bcx --Continue IV daptomycin (2/11-4/8) --Continue Robaxin 750 mg TID, gabapentin 600 mg QHS --Continue scheduled oxy and prn dilaudid --Change Tylenol to prn for fever.  Anemia of chronic disease Iron Deficiency Anemia? Slight drop in hgb from 8.1 to 7.6 this morning. Labs consistent with hypoproliferative picture in the setting of lack of appropriate rise in RDW. Will follow up on EPO levels but will consider imaging to access for possible intramuscular bleed, retroperitoneal bleed or hematoma at site of back incision.  --Pending erythropoietin levels --Follow-up blood smear --Daily CBC  Acute renal failure Nephritic vs. Nephrotic syndrome Kidney function continues to worsen with creatinine rise to 2.89 from 2.32 and GFR down to 27 from 35. Repeat urinalysis significant for proteinuria, hemoglobinuria, rare bacteria and trace leuks. Patient denies any urinary symptoms but had suboptimal urine output overnight (900 cc). Urine studies rules out ATN as cause of his renal failure. In the setting of his hemoglobinuria, worsening proteinuria, hypoalbuminemia, LE edema, differential includes poststaphalococcal glomerunephritis, Rapidly Progressive Glomerulonephritis, or nephrotic syndrome. Spot protein-creatinine ratio of 1.44 makes nephrotic syndrome unlikely. Plan to reconsult nephrology if kidney function continues to worsen.  --Giving 500 cc fluid bolus --Follow-up 24-hour urine protein --Continue Lokelma 5 mg twice daily --Daily CMP --Strict I&O's, daily weights --Continue lokelma 5 g BID --Bladder scan x1  Hypertension BP continues to improve. Systolic BP in the 130-140 range overnight. Continue to monitor. --Continue Coreg 6.25 twice daily and amlodipine 10 mg daily --Daily  vitals  Hypalbuminemia S/p IV albumin 3x doses. Albumin still low but stable at 2.0. LE edema improved. --Repeat IV albumin x1 dose --Continue TED hose --Daily RFP  Diet: Renal diet IVF: None VTE: Lovenox CODE: Full Code  Prior to Admission Living Arrangement: Home Anticipated Discharge Location: Home w/ HH Barriers to Discharge: Medical work up Dispo: Anticipated discharge in approximately 2-3 day(s).   Signed: Steffanie Rainwater, MD 06/12/2020, 6:50 AM  Pager: [phone] Internal Medicine Teaching Service After 5pm on weekdays and 1pm on weekends: On Call pager: [phone]

## 2020-06-12 NOTE — Discharge Summary (Incomplete)
Name: Steven Cortez MRN: [id number] DOB: [birth year] 45 y.o. PCP: Pcp, No  Date of Admission: 05/21/2020  8:27 AM Date of Discharge:  Attending Physician: Dr. Philipp Ovens  Discharge Diagnosis: Principal Problem:   Endocarditis Active Problems:   Urinary retention   AKI (acute kidney injury) ([place])   Hyperkalemia   Microcytic anemia   MRSA infection   Epidural abscess   Opioid use disorder   Hydronephrosis   Malnutrition of moderate degree   Antibiotic long-term use   Chronic viral hepatitis B without delta agent and without coma [facility])    Discharge Medications: Allergies as of 06/12/2020   No Known Allergies   Med Rec must be completed prior to using this [facility]***        Durable Medical Equipment  (From admission, onward)         Start     Ordered   05/25/20 1646  For home use only DME Walker rolling  Once       Question Answer Comment  Walker: With 5 Inch Wheels   Patient needs a walker to treat with the following condition Weakness      05/25/20 1646          Disposition and follow-up:   Steven Cortez was discharged from [facility] in {DISCHARGE CONDITION:19696} condition.  At the hospital follow up visit please address:  1.  Follow-up:  A. MRSA Tricuspid Endocarditis    B.    c.   d.  2.  Labs / imaging needed at time of follow-up: {Labs:13245}  3.  Pending labs/ test needing follow-up: None  Follow-up Appointments:  Follow-up [facility] Follow up on 06/18/2020.   Why: 10:30 for hospital follow up Contact information: [place] [address] [place], [facility] Follow up.   Why: rolling walker thru charity Contact information: [place] [address] [phone]               Hospital Course by problem list:  MRSA tricuspid endocarditis Complicated by septic emboli and epidural abscess  s/p I&D w/ laminectomy (x2).  Patient is clinically unchanged from yesterday. Fever curve trending down patient currently on scheduled Tylenol every 6 hours. Repeat blood cultures pending. Per ID, patient will need imaging of back if he continues to be feverish overnight. --ID following, appreciate recs --F/u Bcx --Continue IV daptomycin (2/11-4/8) --Continue Robaxin 750 mg TID, gabapentin 600 mg QHS --Continue scheduled oxy and prn dilaudid --Change Tylenol to prn for fever.  Anemia of chronic disease Iron Deficiency Anemia? Slight drop in hgb from 8.1 to 7.6 this morning. Labs consistent with hypoproliferative picture in the setting of lack of appropriate rise in RDW. Will follow up on EPO levels but will consider imaging to access for possible intramuscular bleed, retroperitoneal bleed or hematoma at site of back incision.  --Pending erythropoietin levels --Follow-up blood smear --Daily CBC  Acute renal failure Nephritic vs. Nephrotic syndrome Kidney function continues to worsen with creatinine rise to 2.89 from 2.32 and GFR down to 27 from 35. Repeat urinalysis significant for proteinuria, hemoglobinuria, rare bacteria and trace leuks. Patient denies any urinary symptoms but had suboptimal urine output overnight (900 cc). Urine studies rules out ATN as cause of his renal failure. In the setting of his hemoglobinuria, worsening proteinuria, hypoalbuminemia, LE edema, differential includes poststaphalococcal glomerunephritis, Rapidly Progressive Glomerulonephritis, or nephrotic syndrome. Spot protein-creatinine  ratio of 1.44 makes nephrotic syndrome unlikely. Plan to reconsult nephrology if kidney function continues to worsen.  --Giving 500 cc fluid bolus --Follow-up 24-hour urine protein --Continue Lokelma 5 mg twice daily --Daily CMP --Strict I&O's, daily weights --Continue lokelma 5 g BID --Bladder scan x1  Hypertension BP continues to improve. Systolic BP in the 606-301 range  overnight. Continue to monitor. --Continue Coreg 6.25 twice daily and amlodipine 10 mg daily --Daily vitals  Hypalbuminemia Patient had persistently low albumin throughout his admission. He received a total IV albumin 3x doses. He continued to third-spaced throughout her stay. He was ordered a TED hose to help with the leg swelling. This improved and patient was discharged with a bicarb of ***  Subjective  Discharge Vitals:   BP (!) 149/96   Pulse 100   Temp 100.3 F (37.9 C) (Oral)   Resp 20   Ht 5' 10"  (1.778 m)   Wt 88.9 kg   SpO2 95%   BMI 28.12 kg/m  Discharge Exam  Pertinent Labs, Studies, and Procedures:  CBC Latest Ref Rng & Units 06/12/2020 06/11/2020 06/11/2020  WBC 4.0 - 10.5 K/uL 8.6 10.6(H) 10.6(H)  Hemoglobin 13.0 - 17.0 g/dL 7.6(L) 8.1(L) 7.8(L)  Hematocrit 39.0 - 52.0 % 24.3(L) 25.7(L) 24.5(L)  Platelets 150 - 400 K/uL 225 234 219    CMP Latest Ref Rng & Units 06/12/2020 06/11/2020 06/10/2020  Glucose 70 - 99 mg/dL 99 102(H) 113(H)  BUN 6 - 20 mg/dL 64(H) 57(H) 55(H)  Creatinine 0.61 - 1.24 mg/dL 2.89(H) 2.32(H) 1.85(H)  Sodium 135 - 145 mmol/L 138 138 138  Potassium 3.5 - 5.1 mmol/L 4.4 4.5 4.6  Chloride 98 - 111 mmol/L 104 103 103  CO2 22 - 32 mmol/L 25 25 26   Calcium 8.9 - 10.3 mg/dL 7.9(L) 7.9(L) 8.1(L)  Total Protein 6.5 - 8.1 g/dL 6.5 - -  Total Bilirubin 0.3 - 1.2 mg/dL 1.1 - -  Alkaline Phos 38 - 126 U/L 260(H) - -  AST 15 - 41 U/L 25 - -  ALT 0 - 44 U/L 32 - -    US RENAL  Result Date: 05/21/2020 CLINICAL DATA:  AK I EXAM: RENAL / URINARY TRACT ULTRASOUND COMPLETE COMPARISON:  May 17, 2020 FINDINGS: Right Kidney: Renal measurements: 12.5 x 5.0 x 5.6 cm = volume: 182 mL. Diffusely increased renal echogenicity. There is mild RIGHT hydronephrosis, similar in comparison to prior. Left Kidney: Renal measurements: 13.1 x 5.5 x 5.9 cm = volume: 222 mL. Renal echogenicity is diffusely increased. There is minimal LEFT hydronephrosis. Bladder: Distended  Other: Small volume ascites. Splenomegaly the spleen measuring at least 14 x 13.5 x 8 cm for a volume of 792 ML. Small LEFT pleural effusion. IMPRESSION: 1. There is mild RIGHT greater than LEFT hydronephrosis. The bladder is distended. Recommend correlation for outlet obstruction. 2. Splenomegaly. 3. Trace ascites and small LEFT pleural effusion. 4. Diffusely increased renal echogenicity as can be seen in medical renal disease. Electronically Signed   By: Valentino Saxon MD   On: 05/21/2020 13:52   IR Fluoro Guide CV Line Right  Result Date: 05/22/2020 INDICATION: 45 year old male referred for non tunneled hemodialysis catheter EXAM: CENTRAL VENOUS HEMODIALYSIS CATHETER PLACEMENT WITH ULTRASOUND AND FLUOROSCOPIC GUIDANCE MEDICATIONS: NONE ANESTHESIA/SEDATION: None FLUOROSCOPY TIME:  Fluoroscopy Time: 0 minutes 6 seconds (1 mGy). COMPLICATIONS: None PROCEDURE: Informed written consent was obtained from the patient's family after a discussion of the risks, benefits, and alternatives to treatment. Questions regarding the procedure were encouraged and answered.  The right neck was prepped with chlorhexidine in a sterile fashion, and a sterile drape was applied covering the operative field. Maximum barrier sterile technique with sterile gowns and gloves were used for the procedure. A timeout was performed prior to the initiation of the procedure. A micropuncture kit was utilized to access the right internal jugular vein under direct, real-time ultrasound guidance after the overlying soft tissues were anesthetized with 1% lidocaine with epinephrine. Ultrasound image documentation was performed. The microwire was kinked to measure appropriate catheter length. A stiff glidewire was advanced to the level of the IVC. A 16 cm hemodialysis catheter was then placed over the wire. Final catheter positioning was confirmed and documented with a spot radiographic image. The catheter aspirates and flushes normally. The  catheter was flushed with appropriate volume heparin dwells. Dressings were applied. The patient tolerated the procedure well without immediate post procedural complication. IMPRESSION: Status post image guided placement of right IJ temporary triple-lumen hemodialysis catheter. Signed, Dulcy Fanny. Dellia Nims, RPVI [facility] Radiology Electronically Signed   By: Corrie Mckusick D.O.   On: 05/22/2020 14:12   IR US Guide Vasc Access Right  Result Date: 05/26/2020 INDICATION: 45 year old male referred for non tunneled hemodialysis catheter EXAM: CENTRAL VENOUS HEMODIALYSIS CATHETER PLACEMENT WITH ULTRASOUND AND FLUOROSCOPIC GUIDANCE MEDICATIONS: NONE ANESTHESIA/SEDATION: None FLUOROSCOPY TIME:  Fluoroscopy Time: 0 minutes 6 seconds (1 mGy). COMPLICATIONS: None PROCEDURE: Informed written consent was obtained from the patient's family after a discussion of the risks, benefits, and alternatives to treatment. Questions regarding the procedure were encouraged and answered. The right neck was prepped with chlorhexidine in a sterile fashion, and a sterile drape was applied covering the operative field. Maximum barrier sterile technique with sterile gowns and gloves were used for the procedure. A timeout was performed prior to the initiation of the procedure. A micropuncture kit was utilized to access the right internal jugular vein under direct, real-time ultrasound guidance after the overlying soft tissues were anesthetized with 1% lidocaine with epinephrine. Ultrasound image documentation was performed. The microwire was kinked to measure appropriate catheter length. A stiff glidewire was advanced to the level of the IVC. A 16 cm hemodialysis catheter was then placed over the wire. Final catheter positioning was confirmed and documented with a spot radiographic image. The catheter aspirates and flushes normally. The catheter was flushed with appropriate volume heparin dwells.  Dressings were applied. The patient tolerated the procedure well without immediate post procedural complication. IMPRESSION: Status post image guided placement of right IJ temporary triple-lumen hemodialysis catheter. Signed, Dulcy Fanny. Dellia Nims, RPVI [facility] Radiology Electronically Signed   By: Corrie Mckusick D.O.   On: 05/22/2020 14:12   DG Chest Port 1 View  Result Date: 05/21/2020 CLINICAL DATA:  Chest pain and shortness of breath EXAM: PORTABLE CHEST 1 VIEW COMPARISON:  May 15, 2020 chest radiograph and chest CT May 18, 2020 FINDINGS: There are again noted multiple nodular lesions throughout the lungs bilaterally, several of which are cavitated. There is a small right pleural effusion. There is ill-defined airspace opacity in the right base which appears increased from recent studies. Heart size and pulmonary vascular normal. No adenopathy. Degenerative changes noted in the left shoulder. IMPRESSION: Multiple ill-defined nodular opacities of varying sizes, several of which show cavitation, likely representing multifocal septic emboli. These nodular opacities overall appear similar to findings on most recent CT. There is a small right pleural effusion with slight increase in airspace opacity in  the right base, likely focus of pneumonia. Stable cardiac silhouette. Electronically Signed   By: Lowella Grip III M.D.   On: 05/21/2020 09:02   ECHOCARDIOGRAM COMPLETE  Result Date: 05/22/2020    ECHOCARDIOGRAM REPORT   Patient Name:   Steven Cortez Date of Exam: 05/22/2020 Medical Rec #:  [id number]      Height:       70.0 in Accession #:    [id number]     Weight:       180.3 lb Date of Birth:  02-Jul-1975      BSA:          1.998 m Patient Age:    2 years       BP:           148/80 mmHg Patient Gender: M              HR:           90 bpm. Exam Location:  Inpatient Procedure: 2D Echo, Cardiac Doppler and Color Doppler  Reported to: Dr. Debara Pickett on  05/22/2020 3:20:00 AM      Mass visualized on Tricuspid valve. Indications:    Endocarditis. I38  History:        Patient has no prior history of Echocardiogram examinations.  Sonographer:    Jonelle Sidle Dance Referring Phys: [id number] Raynaldo Opitz RAINES IMPRESSIONS  1. Left ventricular ejection fraction, by estimation, is 60 to 65%. The left ventricle has normal function. The left ventricle has no regional wall motion abnormalities. There is mild left ventricular hypertrophy. Left ventricular diastolic parameters were normal.  2. Right ventricular systolic function is normal. The right ventricular size is normal.  3. The mitral valve is normal in structure. No evidence of mitral valve regurgitation.  4. Large mobile echogenic mass noted on the RV side of the tricuspid valve septal leaflet, measuring 1 x 3 cm, suggestive of vegetation. The tricuspid valve is abnormal.  5. The aortic valve is tricuspid. Aortic valve regurgitation is not visualized.  6. The inferior vena cava is normal in size with greater than 50% respiratory variability, suggesting right atrial pressure of 3 mmHg. Comparison(s): No prior Echocardiogram. Conclusion(s)/Recommendation(s): Findings demonstrate tricuspid valve mass likley vegetation, would recommend a Transesophageal Echocardiogram for further evaluation. Critical findings reported to Dr. Rebeca Alert and acknowledged at 05/22/2020 at 3:56 pm. FINDINGS  Left Ventricle: Left ventricular ejection fraction, by estimation, is 60 to 65%. The left ventricle has normal function. The left ventricle has no regional wall motion abnormalities. The left ventricular internal cavity size was normal in size. There is  mild left ventricular hypertrophy. Left ventricular diastolic parameters were normal. Right Ventricle: The right ventricular size is normal. No increase in right ventricular wall thickness. Right ventricular systolic function is normal. Left Atrium: Left atrial size was normal in size. Right Atrium:  Right atrial size was normal in size. Pericardium: There is no evidence of pericardial effusion. Mitral Valve: The mitral valve is normal in structure. No evidence of mitral valve regurgitation. Tricuspid Valve: Large mobile echogenic mass noted on the RV side of the tricuspid valve septal leaflet, measuring 1 x 3 cm, suggestive of vegetation. The tricuspid valve is abnormal. Tricuspid valve regurgitation is mild. Aortic Valve: The aortic valve is tricuspid. Aortic valve regurgitation is not visualized. Pulmonic Valve: The pulmonic valve was normal in structure. Pulmonic valve regurgitation is not visualized. Aorta: The aortic root and ascending aorta are structurally normal, with no evidence of dilitation. Venous: The inferior vena cava is  normal in size with greater than 50% respiratory variability, suggesting right atrial pressure of 3 mmHg. IAS/Shunts: No atrial level shunt detected by color flow Doppler.  LEFT VENTRICLE PLAX 2D LVIDd:         4.90 cm  Diastology LVIDs:         3.70 cm  LV e' medial:    12.20 cm/s LV PW:         1.30 cm  LV E/e' medial:  8.7 LV IVS:        0.80 cm  LV e' lateral:   14.30 cm/s LVOT diam:     2.00 cm  LV E/e' lateral: 7.4 LV SV:         96 LV SV Index:   48 LVOT Area:     3.14 cm  RIGHT VENTRICLE             IVC RV Basal diam:  2.30 cm     IVC diam: 2.00 cm RV S prime:     17.70 cm/s TAPSE (M-mode): 2.8 cm LEFT ATRIUM             Index       RIGHT ATRIUM           Index LA diam:        4.20 cm 2.10 cm/m  RA Area:     16.50 cm LA Vol (A2C):   71.2 ml 35.64 ml/m RA Volume:   43.30 ml  21.68 ml/m LA Vol (A4C):   61.3 ml 30.69 ml/m LA Biplane Vol: 67.1 ml 33.59 ml/m  AORTIC VALVE LVOT Vmax:   168.00 cm/s LVOT Vmean:  116.000 cm/s LVOT VTI:    0.306 m  AORTA Ao Root diam: 3.40 cm Ao Asc diam:  3.20 cm MITRAL VALVE MV Area (PHT): 2.91 cm     SHUNTS MV Decel Time: 261 msec     Systemic VTI:  0.31 m MV E velocity: 106.00 cm/s  Systemic Diam: 2.00 cm MV A velocity: 82.00 cm/s MV  E/A ratio:  1.29 Lyman Bishop MD Electronically signed by Lyman Bishop MD Signature Date/Time: 05/22/2020/3:59:45 PM    Final      Discharge Instructions:   Signed: Lacinda Axon, MD 06/18/2020, 6:34 AM   Pager: [phone]

## 2020-06-12 NOTE — Progress Notes (Signed)
[facility] Infectious Diseases Follow Up Note  Patient Identification: Patient Name: Steven Cortez MRN: [id number] Admit Date: 05/21/2020  8:27 AM Age: 45 y.o.Today's Date: 06/12/2020   Reason for Visit: fevers   Principal Problem:   Endocarditis Active Problems:   Urinary retention   AKI (acute kidney injury) (HCC)   Hyperkalemia   Microcytic anemia   MRSA infection   Epidural abscess   Opioid use disorder   Hydronephrosis   Malnutrition of moderate degree   Antibiotic long-term use   Chronic viral hepatitis B without delta agent and without coma (HCC)  Current Antibiotics: Daptomycin   Lines/Tubes: PICC RT arm    Interval Events: Continues to have low grade fevers, back spasms. Swelling at the lower back seems to be stable    Assessment # Fevers  Denies respiratory symptoms, GI symptoms, GU symptoms. Denies rashes. PICC line site tenderness and swelling No obvious swelling in the extremities for DVT   # Lumbar surgical site swelling ( seroma vs hematoma)- seen by Neuro sx , plannned to watch for now  # Disseminated MRSA bacteremia TV Endocarditis c/b septic pulmonary emboli  Epidural abscess/discitis and L4-L5 discitis/Osteomyelitis/Septic arthritis s/p laminectomy on 1/29 and laminectomy and evacuation of epidural phlegmon on 2/11. OR cultures growing MRSA  Recommendations -No obvious source for persistent low grade fevers . Blood culture from 2/17 are pending at this time.  -Given persistent back pain/spasms with fever and leukocytosis, no other sources identified,  would recommend to image lower back if continues to spike fever -Continue Daptomycin for now -FU fever curve, WBC and potential sources for fevers  Will follow peripherally over the weekend.  Rest of the management as per the primary team. Thank you for the consult. Please page with pertinent questions or  concerns.  ______________________________________________________________________ Subjective patient seen and examined at the bedside. Continues to have back pain/spams. Was not able to work with PT.   Vitals BP (!) 141/80 (BP Location: Left Arm)   Pulse 92   Temp 100.2 F (37.9 C) (Oral)   Resp 16   Ht 5\' 10"  (1.778 m)   Wt 88.9 kg   SpO2 93%   BMI 28.12 kg/m   Physical Exam Lying in bed and in moderate back pain  Chest - clear bilaterally CVS- Normal s1s2, RRR Abdomen - soft, NT Extremities power 4/5 in both legs Back - lumbar peri-incisional swelling - no warmth, tenderness, no soakage in bandage, same size as yesterday   Pertinent Microbiology Results for orders placed or performed during the hospital encounter of 05/21/20  SARS Coronavirus 2 by RT PCR (hospital order, performed in [facility] hospital lab) Nasopharyngeal Nasopharyngeal Swab     Status: None   Collection Time: 05/21/20  8:45 AM   Specimen: Nasopharyngeal Swab  Result Value Ref Range Status   SARS Coronavirus 2 NEGATIVE NEGATIVE Final    Comment: (NOTE) SARS-CoV-2 target nucleic acids are NOT DETECTED.  The SARS-CoV-2 RNA is generally detectable in upper and lower respiratory specimens during the acute phase of infection. The lowest concentration of SARS-CoV-2 viral copies this assay can detect is 250 copies / mL. A negative result does not preclude SARS-CoV-2 infection and should not be used as the sole basis for treatment or other patient management decisions.  A negative result may occur with improper specimen collection / handling, submission of specimen other than nasopharyngeal swab, presence of viral mutation(s) within the areas targeted by this assay, and inadequate number of viral copies (<250 copies /  mL). A negative result must be combined with clinical observations, patient history, and epidemiological information.  Fact Sheet for Patients:    BoilerBrush.com.cy  Fact Sheet for Healthcare Providers: https://pope.com/  This test is not yet approved or  cleared by the [place] FDA and has been authorized for detection and/or diagnosis of SARS-CoV-2 by FDA under an Emergency Use Authorization (EUA).  This EUA will remain in effect (meaning this test can be used) for the duration of the COVID-19 declaration under Section 564(b)(1) of the Act, 21 U.S.C. section 360bbb-3(b)(1), unless the authorization is terminated or revoked sooner.  Performed at [facility], [address] [place], [place] [address]   Blood culture (routine x 2)     Status: None   Collection Time: 05/21/20  9:18 AM   Specimen: BLOOD  Result Value Ref Range Status   Specimen Description BLOOD SITE NOT SPECIFIED  Final   Special Requests   Final    BOTTLES DRAWN AEROBIC ONLY Blood Culture results may not be optimal due to an inadequate volume of blood received in culture bottles   Culture   Final    NO GROWTH 5 DAYS Performed at [facility], [address], [place], [place] [address]    Report Status 05/26/2020 FINAL  Final  Surgical pcr screen     Status: Abnormal   Collection Time: 05/23/20  7:26 AM   Specimen: Nasal Mucosa; Nasal Swab  Result Value Ref Range Status   MRSA, PCR POSITIVE (A) NEGATIVE Final    Comment: CRITICAL RESULT CALLED TO, READ BACK BY AND VERIFIED WITH: TINA ISSACS AT 1022 1/29 BY MM    Staphylococcus aureus POSITIVE (A) NEGATIVE Final    Comment: (NOTE) The Xpert SA Assay (FDA approved for NASAL specimens in patients 21 years of age and older), is one component of a comprehensive surveillance program. It is not intended to diagnose infection nor to guide or monitor treatment. Performed at [facility], [address] [place], [place] [address]   Aerobic/Anaerobic Culture (surgical/deep wound)     Status: None   Collection Time: 05/23/20  9:07 AM    Specimen: Abscess  Result Value Ref Range Status   Specimen Description ABSCESS  Final   Special Requests LUMBAR EPIDURAL SPEC A  Final   Gram Stain   Final    RARE WBC PRESENT,BOTH PMN AND MONONUCLEAR NO ORGANISMS SEEN    Culture   Final    FEW METHICILLIN RESISTANT STAPHYLOCOCCUS AUREUS NO ANAEROBES ISOLATED Performed at [facility], [address], [place], [place] [address]    Report Status 05/28/2020 FINAL  Final   Organism ID, Bacteria METHICILLIN RESISTANT STAPHYLOCOCCUS AUREUS  Final      Susceptibility   Methicillin resistant staphylococcus aureus - MIC*    CIPROFLOXACIN >=8 RESISTANT Resistant     ERYTHROMYCIN >=8 RESISTANT Resistant     GENTAMICIN <=0.5 SENSITIVE Sensitive     OXACILLIN >=4 RESISTANT Resistant     TETRACYCLINE <=1 SENSITIVE Sensitive     VANCOMYCIN <=0.5 SENSITIVE Sensitive     TRIMETH/SULFA <=10 SENSITIVE Sensitive     CLINDAMYCIN <=0.25 SENSITIVE Sensitive     RIFAMPIN <=0.5 SENSITIVE Sensitive     Inducible Clindamycin NEGATIVE Sensitive     * FEW METHICILLIN RESISTANT STAPHYLOCOCCUS AUREUS   Pertinent Lab. CBC Latest Ref Rng & Units 06/12/2020 06/11/2020 06/11/2020  WBC 4.0 - 10.5 K/uL 8.6 10.6(H) 10.6(H)  Hemoglobin 13.0 - 17.0 g/dL 7.6(L) 8.1(L)  7.8(L)  Hematocrit 39.0 - 52.0 % 24.3(L) 25.7(L) 24.5(L)  Platelets 150 - 400 K/uL 225 234 219   CMP Latest Ref Rng & Units 06/12/2020 06/11/2020 06/10/2020  Glucose 70 - 99 mg/dL 99 449(Q) 759(F)  BUN 6 - 20 mg/dL 63(W) 46(K) 59(D)  Creatinine 0.61 - 1.24 mg/dL 3.57(S) 1.77(L) 3.90(Z)  Sodium 135 - 145 mmol/L 138 138 138  Potassium 3.5 - 5.1 mmol/L 4.4 4.5 4.6  Chloride 98 - 111 mmol/L 104 103 103  CO2 22 - 32 mmol/L 25 25 26   Calcium 8.9 - 10.3 mg/dL 7.9(L) 7.9(L) 8.1(L)  Total Protein 6.5 - 8.1 g/dL 6.5 - -  Total Bilirubin 0.3 - 1.2 mg/dL 1.1 - -  Alkaline Phos 38 - 126 U/L 260(H) - -  AST 15 - 41 U/L 25 - -  ALT 0 - 44 U/L 32 - -     Pertinent Imaging today Plain films and CT images  have been personally visualized and interpreted; radiology reports have been reviewed. Decision making incorporated into the Impression / Recommendations.  I have spent approx 30 minutes for this patient encounter including review of prior medical records with greater than 50% of time being face to face and coordination of their care.  Electronically signed by:   , MD Infectious Disease Physician [facility] for Infectious Disease Pager: [phone]

## 2020-06-12 NOTE — Progress Notes (Signed)
PT Cancellation Note  Patient Details Name: Ozil Stettler MRN: [id number] DOB: 09/11/1975   Cancelled Treatment:    Reason Eval/Treat Not Completed: Patient at procedure or test/unavailable Pt getting [place] at bedside from MD per RN. Will follow up as time allows.   Blake Divine A Shantella Blubaugh 06/12/2020, 2:31 PM [name], PT, DPT Acute Rehabilitation Services Pager [phone] Office [phone]

## 2020-06-12 NOTE — Plan of Care (Signed)
  Problem: Pain Managment: Goal: General experience of comfort will improve Outcome: Progressing   

## 2020-06-13 LAB — CBC
HCT: 21.4 % — ABNORMAL LOW (ref 39.0–52.0)
Hemoglobin: 7 g/dL — ABNORMAL LOW (ref 13.0–17.0)
MCH: 29.4 pg (ref 26.0–34.0)
MCHC: 32.7 g/dL (ref 30.0–36.0)
MCV: 89.9 fL (ref 80.0–100.0)
Platelets: 199 10*3/uL (ref 150–400)
RBC: 2.38 MIL/uL — ABNORMAL LOW (ref 4.22–5.81)
RDW: 16 % — ABNORMAL HIGH (ref 11.5–15.5)
WBC: 7.1 10*3/uL (ref 4.0–10.5)
nRBC: 0 % (ref 0.0–0.2)

## 2020-06-13 LAB — RENAL FUNCTION PANEL
Albumin: 2.1 g/dL — ABNORMAL LOW (ref 3.5–5.0)
Anion gap: 14 (ref 5–15)
BUN: 66 mg/dL — ABNORMAL HIGH (ref 6–20)
CO2: 23 mmol/L (ref 22–32)
Calcium: 7.8 mg/dL — ABNORMAL LOW (ref 8.9–10.3)
Chloride: 102 mmol/L (ref 98–111)
Creatinine, Ser: 3.3 mg/dL — ABNORMAL HIGH (ref 0.61–1.24)
GFR, Estimated: 23 mL/min — ABNORMAL LOW (ref >60)
Glucose, Bld: 139 mg/dL — ABNORMAL HIGH (ref 70–99)
Phosphorus: 5.5 mg/dL — ABNORMAL HIGH (ref 2.5–4.6)
Potassium: 4.4 mmol/L (ref 3.5–5.1)
Sodium: 139 mmol/L (ref 135–145)

## 2020-06-13 LAB — PROTEIN, URINE, 24 HOUR
Collection Interval-UPROT: 24 hours
Protein, 24H Urine: 13482 mg/d — ABNORMAL HIGH (ref 50–100)
Protein, Urine: 642 mg/dL
Urine Total Volume-UPROT: 2100 mL

## 2020-06-13 LAB — HEMOGLOBIN AND HEMATOCRIT, BLOOD
HCT: 23 % — ABNORMAL LOW (ref 39.0–52.0)
Hemoglobin: 7.6 g/dL — ABNORMAL LOW (ref 13.0–17.0)

## 2020-06-13 LAB — PREPARE RBC (CROSSMATCH)

## 2020-06-13 MED ORDER — LACTATED RINGERS IV BOLUS
1000.0000 mL | Freq: Once | INTRAVENOUS | Status: AC
  Administered 2020-06-13: 1000 mL via INTRAVENOUS

## 2020-06-13 MED ORDER — DARBEPOETIN ALFA 100 MCG/0.5ML IJ SOSY
100.0000 ug | PREFILLED_SYRINGE | Freq: Once | INTRAMUSCULAR | Status: AC
  Administered 2020-06-13: 100 ug via SUBCUTANEOUS
  Filled 2020-06-13: qty 0.5

## 2020-06-13 MED ORDER — SODIUM CHLORIDE 0.9% IV SOLUTION: Freq: Once | INTRAVENOUS | Status: AC

## 2020-06-13 NOTE — Progress Notes (Signed)
Subjective:   Hospital day: 22  Overnight event: No acute events overnight.   Patient states he is not doing well. States he has not gotten better or worse compared to yesterday. His back spasms have improved but he has been shaking more the last few days. States he is urinating and moving his bowels well.   Objective:  Vital signs in last 24 hours: Vitals:   06/12/20 2008 06/13/20 0100 06/13/20 0200 06/13/20 0312  BP: (!) 146/85 (!) 148/93  (!) 153/87  Pulse: 97 97  97  Resp: 18 16  18   Temp: 99.4 F (37.4 C) 99.6 F (37.6 C)  99.8 F (37.7 C)  TempSrc: Oral Oral  Oral  SpO2: 95% 96%  92%  Weight:   90.7 kg 88.4 kg  Height:        Filed Weights   06/12/20 0429 06/13/20 0200 06/13/20 0312  Weight: 88.9 kg 90.7 kg 88.4 kg     Intake/Output Summary (Last 24 hours) at 06/13/2020 0658 Last data filed at 06/13/2020 0314 Gross per 24 hour  Intake 948.53 ml  Output 1330 ml  Net -381.47 ml   Net IO Since Admission: -22,338.92 mL [06/13/20 0658]  No results for input(s): GLUCAP in the last 72 hours.   Pertinent Labs: CBC Latest Ref Rng & Units 06/12/2020 06/11/2020 06/11/2020  WBC 4.0 - 10.5 K/uL 8.6 10.6(H) 10.6(H)  Hemoglobin 13.0 - 17.0 g/dL 7.6(L) 8.1(L) 7.8(L)  Hematocrit 39.0 - 52.0 % 24.3(L) 25.7(L) 24.5(L)  Platelets 150 - 400 K/uL 225 234 219    CMP Latest Ref Rng & Units 06/13/2020 06/12/2020 06/11/2020  Glucose 70 - 99 mg/dL 06/13/2020) 99 161(W)  BUN 6 - 20 mg/dL 960(A) 54(U) 98(J)  Creatinine 0.61 - 1.24 mg/dL 19(J) 4.78(G) 9.56(O)  Sodium 135 - 145 mmol/L 139 138 138  Potassium 3.5 - 5.1 mmol/L 4.4 4.4 4.5  Chloride 98 - 111 mmol/L 102 104 103  CO2 22 - 32 mmol/L 23 25 25   Calcium 8.9 - 10.3 mg/dL 7.8(L) 7.9(L) 7.9(L)  Total Protein 6.5 - 8.1 g/dL - 6.5 -  Total Bilirubin 0.3 - 1.2 mg/dL - 1.1 -  Alkaline Phos 38 - 126 U/L - 260(H) -  AST 15 - 41 U/L - 25 -  ALT 0 - 44 U/L - 32 -    Imaging: 1.30(Q RENAL  Result Date: 06/12/2020 CLINICAL DATA:  Acute  renal failure EXAM: RENAL / URINARY TRACT ULTRASOUND COMPLETE COMPARISON:  CT 06/05/2020, ultrasound 01/01/2021 FINDINGS: Right Kidney: Renal measurements: 13.1 x 5.9 x 6.8 cm = volume: 275 mL. Increased renal cortical echogenicity. Mild to moderate hydronephrosis. No concerning renal mass or shadowing calculus. Trace perinephric free fluid. Left Kidney: Renal measurements: 14.2 x 6.3 x 5.4 = volume: 252 mL. Increased renal cortical echogenicity. Mild hydronephrosis. No concerning renal mass or shadowing calculus. Trace perinephric free fluid. Bladder: Normal for degree of distention.  Bilateral bladder jets identified. Other: Trace free fluid in the pelvis. Technically challenging exam due to patient motion artifact related to reported back spasm. IMPRESSION: 1. Mild to moderate right and mild left hydronephrosis. 2. Increased renal cortical echogenicity compatible with medical renal disease. 3. Trace perinephric and pelvic free fluid, nonspecific. 4. Small volume intraperitoneal ascites as well. Electronically Signed   By: 08/03/2020 M.D.   On: 06/12/2020 21:50    Physical Exam  General: Middle-aged man laying a little uncomfortable in bed.  CV: RRR. No LE edema  Pulmonary: Normal effort. Lungs CTAB Abdominal:  Soft. NT/ND. Normal BS. MSK: Intermittent involuntary muscle jerking Extremities: 2+ distalpulses. Skin: No obvious rash or lesions Neuro: A&Ox3. Moves all extremities. BLE clonus with 4-5 beats.   Assessment/Plan: Steven Cortez is a 45 y.o. 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 Hospital course complicated by acute renal failure requiring HD and large epidural abscess s/p laminectomy x2 (1/29 and 2/11). Now on long course IV abx treatment and pain management.   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  Patient clinically unchanged from yesterday. No fevers overnight. Leukocytosis resolved. Repeat blood cultures with no growth after 2 days. No additional sources infection identified. Will continue to monitor.  --ID following, appreciate recs --Continue Daptomycin (2/11-4/8) --Continue Robaxin 750 mg TID, Gabapentin 600 mg QHS for spasms and neurogenic pain --Continue Tylenol prn for fevers  Anemia of chronic disease Iron Deficiency Anemia? Hgb trending down and currently at 7.0 from 7.6 yesterday. Blood smear showed normocytic anemia w/ mild anisocytosis. EPO levels appropriately elevated in the setting anemia. Nephro starting patient Aranesp. No signs of active bleed. Will continue to monitor closely with low threshold to transfuse. S/p 5 units of pRBC since admission. --Transfuse 1 unit pRBC --F/u posttransfusion H&H --Daily CBC --Start Aranesp weekly  Non-Oliguric AKI Patient with worsening kidney function over the last few days (Cr 2.89>3.30). Repeat renal U/S showed mild bilateral hydronephrosis. Spot urine analysis showed UP/UR of 7.20 consistent with nephrotic range proteinuria. Patient making appropriate urine output with UOP of 1.3 over the last 24 hrs. Worsening kidney function and proteinuria likely multifactorial in the setting hydronephrosis and infection. Will consider re-consulting urology for an evaluation for cystourethroscopy if no improvement in kidney function after a few days of managing with a foley. --Nephro re-consulted, appreciate recs    --Place foley    --Repeat renal U/S Monday    --IVLR 1 L bolus x1 dose    --C3, C4 labs    --Discontinue  Lokelma    --No dialysis indicated at the moment --Avoid nephrotoxic agents --Strict I&O's, daily weight  Hypertension BP still elevated this morning to 146/98. Likely a component of pain contributing to the elevated BP. Will avoid ACEi/ARB for now in the setting of AKI. Will continue to monitor. --Continue Coreg 6.25 mg BID and Amlodipine 10 mg daily. --Daily vitals  Hypalbuminemia Albumin levels trending up (2.0>2.1). LE edema improved.  --Continue TED hose as tolerated --Daily RPF  Diet: Renal diet IVF: None VTE: Lovenox CODE: Full Code  Prior to Admission Living Arrangement: Home Anticipated Discharge Location: Home w/ HH Barriers to Discharge: Medical work up Dispo: Anticipated discharge in approximately 2-3 day(s).   Signed: Steffanie Rainwater, MD 06/13/2020, 6:58 AM  Pager: [phone] Internal Medicine Teaching Service After 5pm on weekdays and 1pm on weekends: On Call pager: [phone]

## 2020-06-13 NOTE — Progress Notes (Signed)
Subjective: The patient is alert and pleasant.  He complains of back and leg pain.  Objective: Vital signs in last 24 hours: Temp:  [99.4 F (37.4 C)-100.3 F (37.9 C)] 99.8 F (37.7 C) (02/19 0312) Pulse Rate:  [97-100] 97 (02/19 0312) Resp:  [16-20] 18 (02/19 0312) BP: (146-153)/(85-98) 146/98 (02/19 0958) SpO2:  [92 %-96 %] 92 % (02/19 0312) Weight:  [88.4 kg-90.7 kg] 88.4 kg (02/19 0312) Estimated body mass index is 27.96 kg/m as calculated from the following:   Height as of this encounter: 5\' 10"  (1.778 m).   Weight as of this encounter: 88.4 kg.   Intake/Output from previous day: 02/18 0701 - 02/19 0700 In: 948.5 [P.O.:240; IV Piggyback:708.5] Out: 2630 [Urine:2630] Intake/Output this shift: No intake/output data recorded.  Physical exam the patient is alert and pleasant.  He is moving his lower extremities well.  Lab Results: Recent Labs    06/12/20 0718 06/13/20 0807  WBC 8.6 7.1  HGB 7.6* 7.0*  HCT 24.3* 21.4*  PLT 225 199   BMET Recent Labs    06/12/20 0549 06/13/20 0456  NA 138 139  K 4.4 4.4  CL 104 102  CO2 25 23  GLUCOSE 99 139*  BUN 64* 66*  CREATININE 2.89* 3.30*  CALCIUM 7.9* 7.8*    Studies/Results: 06/15/20 RENAL  Result Date: 06/12/2020 CLINICAL DATA:  Acute renal failure EXAM: RENAL / URINARY TRACT ULTRASOUND COMPLETE COMPARISON:  CT 06/05/2020, ultrasound 01/01/2021 FINDINGS: Right Kidney: Renal measurements: 13.1 x 5.9 x 6.8 cm = volume: 275 mL. Increased renal cortical echogenicity. Mild to moderate hydronephrosis. No concerning renal mass or shadowing calculus. Trace perinephric free fluid. Left Kidney: Renal measurements: 14.2 x 6.3 x 5.4 = volume: 252 mL. Increased renal cortical echogenicity. Mild hydronephrosis. No concerning renal mass or shadowing calculus. Trace perinephric free fluid. Bladder: Normal for degree of distention.  Bilateral bladder jets identified. Other: Trace free fluid in the pelvis. Technically challenging exam due  to patient motion artifact related to reported back spasm. IMPRESSION: 1. Mild to moderate right and mild left hydronephrosis. 2. Increased renal cortical echogenicity compatible with medical renal disease. 3. Trace perinephric and pelvic free fluid, nonspecific. 4. Small volume intraperitoneal ascites as well. Electronically Signed   By: 03/03/2021 M.D.   On: 06/12/2020 21:50    Assessment/Plan: Postop day #8: Hopefully the patient's pain will improve as his infection clears with antibiotics.  Anemia: It looks like he may need some blood.  I will leave this up to the primary team's discretion.  LOS: 23 days     06/14/2020 06/13/2020, 10:39 AM

## 2020-06-13 NOTE — Progress Notes (Signed)
   06/13/20 2004  Assess: MEWS Score  Temp (!) 100.4 F (38 C)  BP (!) 140/92  Pulse Rate (!) 111  SpO2 (!) 89 %  Assess: MEWS Score  MEWS Temp 0  MEWS Systolic 0  MEWS Pulse 2  MEWS RR 0  MEWS LOC 0  MEWS Score 2  MEWS Score Color Yellow  Assess: if the MEWS score is Yellow or Red  Were vital signs taken at a resting state? Yes  Focused Assessment No change from prior assessment  Early Detection of Sepsis Score *See Row Information* Low  MEWS guidelines implemented *See Row Information* Yes  Treat  MEWS Interventions Administered prn meds/treatments  Pain Scale 0-10  Pain Score 7  Take Vital Signs  Increase Vital Sign Frequency  Yellow: Q 2hr X 2 then Q 4hr X 2, if remains yellow, continue Q 4hrs  Escalate  MEWS: Escalate Yellow: discuss with charge nurse/RN and consider discussing with provider and RRT  Notify: Charge Nurse/RN  Name of Charge Nurse/RN Notified Fred  Date Charge Nurse/RN Notified 06/13/20  Time Charge Nurse/RN Notified 2035  Document  Patient Outcome Not stable and remains on department  Progress note created (see row info) Yes  Tylenol given, will monitor for effect.  IV antibiotics running currently.

## 2020-06-13 NOTE — Progress Notes (Signed)
Nephrology Follow-Up Consult note   Assessment/Recommendations: Steven Cortez is a/an 45 y.o. 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     Non-Oliguric AKI: Likely early ATN requiring dialysis now no longer requiring dialysis.  Creatinine improved to 1.5 but steadily rising creatinine for several days now 3.3.  Possible worsening proteinuria.  Also with bilateral hydronephrosis on renal ultrasound.  Would presume this is hydronephrosis until proven otherwise.  Possible intermittent urinary retention related to infectious issues of the spine.  Is also possible that he has periinfectious glomerular disease but would treat the hydronephrosis first -Place Foley catheter at this time -Repeat renal ultrasound likely Monday to ensure resolution of hydronephrosis -Consider 24-hour urine protein collection in a couple days after hydronephrosis is treated -Obtain C3 and C4 given possibility of infectious associated GN -1 L of LR today -Continue to monitor daily Cr, Dose meds for GFR -Monitor Daily I/Os, Daily weight  -Maintain MAP>65 for optimal renal perfusion.  -Avoid nephrotoxic medications including NSAIDs and Vanc/Zosyn combo -Currently no indication for HD  Hypertension: Mild elevation in blood pressure.  Continue to monitor with current medications  Anemia: Likely associated with significant illness.  Start Aranesp for now.  Tricuspid MRSA endocarditis: Management per primary team and infectious disease  Hyperkalemia: Problem in the past and was continued on Lokelma.  We will stop Lokelma for now   Recommendations conveyed to primary service.    Darnell Level [facility] 06/13/2020 8:45 AM  ___________________________________________________________  CC: AKI  Interval History/Subjective: Reconsult for worsening creatinine over  the past few days.  Course continues to be complicated regarding tricuspid endocarditis with MRSA.  Multiple abscesses present.  Did require hemodialysis early in hospital stay but not for some time.  Mild hydronephrosis present in the past.  CT scan on 2/11 demonstrated bilateral hydro as well as signs concerning for pyelonephritis.  Renal ultrasound obtained on 2/18 demonstrated worsening bilateral hydronephrosis without significant bladder distention.  UPC was obtained on 2/18 and was 7.2.  Creatinine improved to 1.5 but now steadily trending up to 3.3 today.  Urine output continues to be good but noted to be dark.  Patient states he is very tired but able to void without significant issues.     Medications:  Current Facility-Administered Medications  Medication Dose Route Frequency Provider Last Rate Last Admin  . 0.9 %  sodium chloride infusion   Intravenous PRN Jadene Pierini, MD   Stopped at 06/02/20 [phone]  . acetaminophen (TYLENOL) tablet 650 mg  650 mg Oral Q4H PRN Steffanie Rainwater, MD      . albuterol (VENTOLIN HFA) 108 (90 Base) MCG/ACT inhaler 1 puff  1 puff Inhalation Q6H PRN Elige Radon, MD   2 puff at 06/05/20 1559  . alteplase (CATHFLO ACTIVASE) injection 2 mg  2 mg Intracatheter Once Reymundo Poll, MD      . amLODipine (NORVASC) tablet 10 mg  10 mg Oral Daily Ephriam Knuckles, Rylee, MD   10 mg at 06/12/20 0927  . carvedilol (COREG) tablet 6.25 mg  6.25 mg Oral BID WC Evlyn Kanner, MD   6.25 mg at 06/13/20 0737  . Chlorhexidine Gluconate Cloth 2 % PADS 6 each  6 each Topical Daily Reymundo Poll, MD   6 each at 06/12/20 [phone]  . DAPTOmycin (CUBICIN) 670 mg in sodium chloride 0.9 % IVPB  670 mg Intravenous Q2000 Price,  Magnus Ivan, RPH 226.8 mL/hr at 06/12/20 2024 670 mg at 06/12/20 2024  . Darbepoetin Alfa (ARANESP) injection 100 mcg  100 mcg Subcutaneous Once Darnell Level, MD      . enoxaparin (LOVENOX) injection 40 mg  40 mg Subcutaneous Daily Reymundo Poll, MD    40 mg at 06/12/20 1610  . feeding supplement (ENSURE ENLIVE / ENSURE PLUS) liquid 237 mL  237 mL Oral BID BM Reymundo Poll, MD   237 mL at 06/12/20 1419  . gabapentin (NEURONTIN) capsule 600 mg  600 mg Oral QHS Steffanie Rainwater, MD   600 mg at 06/12/20 2221  . guaiFENesin-dextromethorphan (ROBITUSSIN DM) 100-10 MG/5ML syrup 5 mL  5 mL Oral Q4H PRN Marguerita Merles Latif, DO   5 mL at 05/25/20 1648  . HYDROmorphone (DILAUDID) injection 1 mg  1 mg Intravenous Q4H PRN Evlyn Kanner, MD   1 mg at 06/13/20 0443  . HYDROmorphone (DILAUDID) injection 1 mg  1 mg Intravenous PRN Steffanie Rainwater, MD      . lactated ringers bolus 1,000 mL  1,000 mL Intravenous Once Darnell Level, MD      . methocarbamol (ROBAXIN) tablet 750 mg  750 mg Oral TID AC & HS Costella, Vincent J, PA-C   750 mg at 06/13/20 9604  . multivitamin with minerals tablet 1 tablet  1 tablet Oral Daily Reymundo Poll, MD   1 tablet at 06/12/20 [phone]  . oxyCODONE (Oxy IR/ROXICODONE) immediate release tablet 15 mg  15 mg Oral Q4H Reymundo Poll, MD   15 mg at 06/13/20 8119  . sodium chloride flush (NS) 0.9 % injection 10-40 mL  10-40 mL Intracatheter Q12H Reymundo Poll, MD   10 mL at 06/12/20 2217  . sodium chloride flush (NS) 0.9 % injection 10-40 mL  10-40 mL Intracatheter PRN Reymundo Poll, MD          Review of Systems: 10 systems reviewed and negative except per interval history/subjective  Physical Exam: Vitals:   06/13/20 0100 06/13/20 0312  BP: (!) 148/93 (!) 153/87  Pulse: 97 97  Resp: 16 18  Temp: 99.6 F (37.6 C) 99.8 F (37.7 C)  SpO2: 96% 92%   No intake/output data recorded.  Intake/Output Summary (Last 24 hours) at 06/13/2020 0845 Last data filed at 06/13/2020 0700 Gross per 24 hour  Intake 948.53 ml  Output 2630 ml  Net -1681.47 ml   Constitutional: Ill-appearing, lying in bed, no distress ENMT: ears and nose without scars or lesions, MMM CV: normal rate, trace bilateral lower  extremity edema Respiratory: Bilateral chest rise, normal work of breathing Gastrointestinal: soft, non-tender, no palpable masses or hernias Skin: no visible lesions or rashes Psych: alert, judgement/insight appropriate, appropriate mood and affect   Test Results I personally reviewed new and old clinical labs and radiology tests Lab Results  Component Value Date   NA 139 06/13/2020   K 4.4 06/13/2020   CL 102 06/13/2020   CO2 23 06/13/2020   BUN 66 (H) 06/13/2020   CREATININE 3.30 (H) 06/13/2020   CALCIUM 7.8 (L) 06/13/2020   ALBUMIN 2.1 (L) 06/13/2020   PHOS 5.5 (H) 06/13/2020

## 2020-06-14 LAB — BPAM RBC
Blood Product Expiration Date: 202202232359
Blood Product Expiration Date: 202203192359
Blood Product Expiration Date: 202203202359
ISSUE DATE / TIME: 202202161130
ISSUE DATE / TIME: 202202170200
ISSUE DATE / TIME: 202202191247
Unit Type and Rh: 5100
Unit Type and Rh: 5100
Unit Type and Rh: 5100

## 2020-06-14 LAB — TYPE AND SCREEN
ABO/RH(D): O POS
Antibody Screen: NEGATIVE
Unit division: 0
Unit division: 0
Unit division: 0

## 2020-06-14 LAB — CBC
HCT: 25.4 % — ABNORMAL LOW (ref 39.0–52.0)
Hemoglobin: 8 g/dL — ABNORMAL LOW (ref 13.0–17.0)
MCH: 28.6 pg (ref 26.0–34.0)
MCHC: 31.5 g/dL (ref 30.0–36.0)
MCV: 90.7 fL (ref 80.0–100.0)
Platelets: 217 10*3/uL (ref 150–400)
RBC: 2.8 MIL/uL — ABNORMAL LOW (ref 4.22–5.81)
RDW: 15.6 % — ABNORMAL HIGH (ref 11.5–15.5)
WBC: 8.6 10*3/uL (ref 4.0–10.5)
nRBC: 0 % (ref 0.0–0.2)

## 2020-06-14 LAB — C4 COMPLEMENT: Complement C4, Body Fluid: 19 mg/dL (ref 12–38)

## 2020-06-14 LAB — RENAL FUNCTION PANEL
Albumin: 2 g/dL — ABNORMAL LOW (ref 3.5–5.0)
Anion gap: 13 (ref 5–15)
BUN: 71 mg/dL — ABNORMAL HIGH (ref 6–20)
CO2: 25 mmol/L (ref 22–32)
Calcium: 8 mg/dL — ABNORMAL LOW (ref 8.9–10.3)
Chloride: 102 mmol/L (ref 98–111)
Creatinine, Ser: 3.73 mg/dL — ABNORMAL HIGH (ref 0.61–1.24)
GFR, Estimated: 20 mL/min — ABNORMAL LOW (ref >60)
Glucose, Bld: 96 mg/dL (ref 70–99)
Phosphorus: 6.3 mg/dL — ABNORMAL HIGH (ref 2.5–4.6)
Potassium: 4.8 mmol/L (ref 3.5–5.1)
Sodium: 140 mmol/L (ref 135–145)

## 2020-06-14 LAB — C3 COMPLEMENT: C3 Complement: 93 mg/dL (ref 82–167)

## 2020-06-14 LAB — SEDIMENTATION RATE: Sed Rate: 115 mm/hr — ABNORMAL HIGH (ref 0–16)

## 2020-06-14 LAB — C-REACTIVE PROTEIN: CRP: 26.4 mg/dL — ABNORMAL HIGH (ref <1.0)

## 2020-06-14 MED ORDER — SODIUM CHLORIDE 0.9 % IV SOLN
670.0000 mg | INTRAVENOUS | Status: DC
  Administered 2020-06-15 – 2020-06-17 (×2): 670 mg via INTRAVENOUS
  Filled 2020-06-14 (×2): qty 13.4

## 2020-06-14 MED ORDER — SENNOSIDES-DOCUSATE SODIUM 8.6-50 MG PO TABS
2.0000 | ORAL_TABLET | Freq: Two times a day (BID) | ORAL | Status: DC
  Administered 2020-06-14 – 2020-07-11 (×22): 2 via ORAL
  Filled 2020-06-14 (×50): qty 2

## 2020-06-14 MED ORDER — ENOXAPARIN SODIUM 30 MG/0.3ML ~~LOC~~ SOLN
30.0000 mg | Freq: Every day | SUBCUTANEOUS | Status: DC
  Administered 2020-06-15 – 2020-06-18 (×4): 30 mg via SUBCUTANEOUS
  Filled 2020-06-14 (×4): qty 0.3

## 2020-06-14 MED ORDER — DIPHENHYDRAMINE HCL 50 MG/ML IJ SOLN: 12.5000 mg | Freq: Four times a day (QID) | INTRAMUSCULAR | Status: DC | PRN

## 2020-06-14 MED ORDER — DIPHENHYDRAMINE HCL 12.5 MG/5ML PO ELIX
12.5000 mg | ORAL_SOLUTION | Freq: Four times a day (QID) | ORAL | Status: DC | PRN
  Administered 2020-06-18: 12.5 mg via ORAL
  Filled 2020-06-14 (×2): qty 5

## 2020-06-14 MED ORDER — ONDANSETRON HCL 4 MG/2ML IJ SOLN: 4.0000 mg | Freq: Four times a day (QID) | INTRAMUSCULAR | Status: DC | PRN

## 2020-06-14 MED ORDER — NALOXONE HCL 0.4 MG/ML IJ SOLN: 0.4000 mg | INTRAMUSCULAR | Status: DC | PRN

## 2020-06-14 MED ORDER — POLYETHYLENE GLYCOL 3350 17 G PO PACK
17.0000 g | PACK | Freq: Every day | ORAL | Status: DC
  Administered 2020-06-14 – 2020-06-16 (×2): 17 g via ORAL
  Filled 2020-06-14 (×3): qty 1

## 2020-06-14 MED ORDER — SODIUM CHLORIDE 0.9% FLUSH: 9.0000 mL | INTRAVENOUS | Status: DC | PRN

## 2020-06-14 MED ORDER — HYDROMORPHONE 1 MG/ML IV SOLN
INTRAVENOUS | Status: DC
Start: 2020-06-14 — End: 2020-06-15
  Administered 2020-06-14: 1 mg via INTRAVENOUS
  Administered 2020-06-14: 30 mg via INTRAVENOUS
  Filled 2020-06-14: qty 30

## 2020-06-14 NOTE — Progress Notes (Signed)
Patient dropped 3 x 5 mg oxycodone and 1.5 x 500mg  robaxin onto floor. Located 1 of the oxycodone and 1 of the robaxin. Notified and EVS. Velia RN as witness of disposal of the found medications.

## 2020-06-14 NOTE — Progress Notes (Addendum)
[facility] Infectious Diseases Follow Up Note  Patient Identification: Patient Name: Steven Cortez MRN: [id number] [place] Date: 05/21/2020  8:27 AM Age: 45 y.o.Today's Date: 06/14/2020   Reason for Visit: Disseminated MRSA infection   Principal Problem:   Endocarditis Active Problems:   Urinary retention   AKI (acute kidney injury) ([place])   Hyperkalemia   Microcytic anemia   MRSA infection   Epidural abscess   Opioid use disorder   Hydronephrosis   Malnutrition of moderate degree   Antibiotic long-term use   Chronic viral hepatitis B without delta agent and without coma (HCC)  CurrentAntibiotics:Daptomycin  Lines/Tubes:PICC RT arm  Interval Events: 1 episode of low grade fever yesterday 100.4 ( in the setting of blood transfusion). WBC is 8. Hemodynamically stable. Surgical wound at the back is smaller with no soakage in dressing   Assessment # Fevers  Denies respiratory symptoms, GI symptoms, GU symptoms. Denies rashes. PICC line site/PIV sites tenderness and swelling No obvious swelling in the extremities for DVT  Lumbar surgical site wound is much smaller in size, no erythema and tenderness today.  One episode of low grade fever yesterday that was after blood transfusion. 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 OR cultures growing MRSA  Recommendations Continue Daptomycin as previously recommended by Dr Gale Journey  for 8 weeks from 2/11. End date 08/05/2020 Monitor CPK while on Daptomycin  ESR and CRP weekly  Will need to FU with CT surgery for repeat echocardiogram after completion of IV antibiotics and  possibly ID clinic follow up  when approaching discharge   Will sign off for now. Call us back with questions or concerns  Rest of the management as per the primary team. Thank you for the consult. ______________________________________________________________________ Subjective patient seen and examined at the bedside. Lying in bed, ready to eat breakfast. Continues to have back spasms  Vitals BP 135/90   Pulse 82   Temp 98.7 F (37.1 C) (Oral)   Resp 18   Ht _0  (1.778 m)   Wt 90 kg   SpO2 100%   BMI 28.47 kg/m   Lying in bed Chest - clear bilaterally CVS- Normal s1s2, RRR Abdomen - soft, NT Extremities power 4/5 in both legs Back - lumbar peri-incisional swelling - no warmth, tenderness, no soakage in bandage, decreased size   Pertinent Microbiology Results for orders placed or performed during the hospital encounter of 05/21/20  SARS Coronavirus 2 by RT PCR (hospital order, performed in [facility] hospital lab) Nasopharyngeal Nasopharyngeal Swab     Status: None   Collection Time: 05/21/20  8:45 AM   Specimen: Nasopharyngeal Swab  Result Value Ref Range Status   SARS Coronavirus 2 NEGATIVE NEGATIVE Final    Comment: (NOTE) SARS-CoV-2 target nucleic acids are NOT DETECTED.  The SARS-CoV-2 RNA is generally detectable in upper and lower respiratory specimens during the acute phase of infection. The lowest concentration of SARS-CoV-2 viral copies this assay can detect is 250 copies / mL. A negative result does not preclude SARS-CoV-2 infection and should not be used as the sole basis for treatment or other patient management decisions.  A negative result may occur with improper specimen collection / handling, submission of specimen other than  nasopharyngeal swab, presence of viral mutation(s) within the areas targeted by this assay, and inadequate number of viral copies (<250 copies / mL). A negative result must be combined with clinical observations, patient  history, and epidemiological information.  Fact Sheet for Patients:   StrictlyIdeas.no  Fact Sheet for Healthcare Providers: BankingDealers.co.za  This test is not yet approved or  cleared by the [place] FDA and has been authorized for detection and/or diagnosis of SARS-CoV-2 by FDA under an Emergency Use Authorization (EUA).  This EUA will remain in effect (meaning this test can be used) for the duration of the COVID-19 declaration under Section 564(b)(1) of the Act, 21 U.S.C. section 360bbb-3(b)(1), unless the authorization is terminated or revoked sooner.  Performed at [facility], [place] [address] [place], [place] [address]   Blood culture (routine x 2)     Status: None   Collection Time: 05/21/20  9:18 AM   Specimen: BLOOD  Result Value Ref Range Status   Specimen Description BLOOD SITE NOT SPECIFIED  Final   Special Requests   Final    BOTTLES DRAWN AEROBIC ONLY Blood Culture results may not be optimal due to an inadequate volume of blood received in culture bottles   Culture   Final    NO GROWTH 5 DAYS Performed at [facility], [place] [address], [place], [place] [address]    Report Status 05/26/2020 FINAL  Final  Surgical pcr screen     Status: Abnormal   Collection Time: 05/23/20  7:26 AM   Specimen: Nasal Mucosa; Nasal Swab  Result Value Ref Range Status   MRSA, PCR POSITIVE (A) NEGATIVE Final    Comment: CRITICAL RESULT CALLED TO, READ BACK BY AND VERIFIED WITH: TINA ISSACS AT 1022 1/29 BY MM    Staphylococcus aureus POSITIVE (A) NEGATIVE Final    Comment: (NOTE) The Xpert SA Assay (FDA approved for NASAL specimens in patients 37 years of age and older), is one component of a comprehensive surveillance program. It is not intended to diagnose infection nor to guide or monitor treatment. Performed at [facility], [place] [address], [place], [place] [address]   Aerobic/Anaerobic Culture  (surgical/deep wound)     Status: None   Collection Time: 05/23/20  9:07 AM   Specimen: Abscess  Result Value Ref Range Status   Specimen Description ABSCESS  Final   Special Requests LUMBAR EPIDURAL SPEC A  Final   Gram Stain   Final    RARE WBC PRESENT,BOTH PMN AND MONONUCLEAR NO ORGANISMS SEEN    Culture   Final    FEW METHICILLIN RESISTANT STAPHYLOCOCCUS AUREUS NO ANAEROBES ISOLATED Performed at [facility], [address], [place], [place] [address]    Report Status 05/28/2020 FINAL  Final   Organism ID, Bacteria METHICILLIN RESISTANT STAPHYLOCOCCUS AUREUS  Final      Susceptibility   Methicillin resistant staphylococcus aureus - MIC*    CIPROFLOXACIN >=8 RESISTANT Resistant     ERYTHROMYCIN >=8 RESISTANT Resistant     GENTAMICIN <=0.5 SENSITIVE Sensitive     OXACILLIN >=4 RESISTANT Resistant     TETRACYCLINE <=1 SENSITIVE Sensitive     VANCOMYCIN <=0.5 SENSITIVE Sensitive     TRIMETH/SULFA <=10 SENSITIVE Sensitive     CLINDAMYCIN <=0.25 SENSITIVE Sensitive     RIFAMPIN <=0.5 SENSITIVE Sensitive     Inducible Clindamycin NEGATIVE Sensitive     * FEW METHICILLIN RESISTANT STAPHYLOCOCCUS AUREUS  Culture, blood (routine x 2)  Status: None (Preliminary result)   Collection Time: 06/11/20  7:50 AM   Specimen: BLOOD LEFT FOREARM  Result Value Ref Range Status   Specimen Description BLOOD LEFT FOREARM  Final   Special Requests   Final    BOTTLES DRAWN AEROBIC AND ANAEROBIC Blood Culture adequate volume   Culture   Final    NO GROWTH 3 DAYS Performed at [facility], [address] [place], [place] [address]    Report Status PENDING  Incomplete  Culture, blood (routine x 2)     Status: None (Preliminary result)   Collection Time: 06/11/20  7:55 AM   Specimen: BLOOD  Result Value Ref Range Status   Specimen Description BLOOD LEFT ANTECUBITAL  Final   Special Requests   Final    BOTTLES DRAWN AEROBIC ONLY Blood Culture adequate volume   Culture   Final     NO GROWTH 3 DAYS Performed at [facility], [place] [address], [place], [place] [address]    Report Status PENDING  Incomplete     Pertinent Lab. CBC Latest Ref Rng & Units 06/14/2020 06/13/2020 06/13/2020  WBC 4.0 - 10.5 K/uL 8.6 - 7.1  Hemoglobin 13.0 - 17.0 g/dL 8.0(L) 7.6(L) 7.0(L)  Hematocrit 39.0 - 52.0 % 25.4(L) 23.0(L) 21.4(L)  Platelets 150 - 400 K/uL 217 - 199   CMP Latest Ref Rng & Units 06/14/2020 06/13/2020 06/12/2020  Glucose 70 - 99 mg/dL 96 139(H) 99  BUN 6 - 20 mg/dL 71(H) 66(H) 64(H)  Creatinine 0.61 - 1.24 mg/dL 3.73(H) 3.30(H) 2.89(H)  Sodium 135 - 145 mmol/L 140 139 138  Potassium 3.5 - 5.1 mmol/L 4.8 4.4 4.4  Chloride 98 - 111 mmol/L 102 102 104  CO2 22 - 32 mmol/L _0 Calcium 8.9 - 10.3 mg/dL 8.0(L) 7.8(L) 7.9(L)  Total Protein 6.5 - 8.1 g/dL - - 6.5  Total Bilirubin 0.3 - 1.2 mg/dL - - 1.1  Alkaline Phos 38 - 126 U/L - - 260(H)  AST 15 - 41 U/L - - 25  ALT 0 - 44 U/L - - 32     Pertinent Imaging today Plain films and CT images have been personally visualized and interpreted; radiology reports have been reviewed. Decision making incorporated into the Impression / Recommendations.  I have spent approx 30 minutes for this patient encounter including review of prior medical records with greater than 50% of time being face to face and coordination of their care.  Electronically signed by:   Rosiland Oz, MD Infectious Disease Physician [facility] Pager: [phone]

## 2020-06-14 NOTE — Progress Notes (Signed)
Nephrology Follow-Up Consult note   Assessment/Recommendations: Steven Cortez is a/an 45 y.o. 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     Non-Oliguric AKI: ATN requiring dialysis early in the hospitalization now no longer requiring dialysis.  Creatinine improved to 1.5 but steadily rising creatinine for several days.  Also now with significant proteinuria 13 g.  Also with bilateral hydronephrosis on renal ultrasound.  Possible intermittent urinary retention related to infectious issues of the spine.  Concern for recent rising creatinine possibly related to hydronephrosis but periinfectious GN also possible -Continue Foley catheter for now -Repeat renal ultrasound likely Monday to ensure resolution of hydronephrosis -Consider biopsy if the patient's creatinine fails to improve, concern for infection related GN possibly MPGN -Follow-up C3/C4 -Continue to monitor daily Cr, Dose meds for GFR -Monitor Daily I/Os, Daily weight  -Maintain MAP>65 for optimal renal perfusion.  -Avoid nephrotoxic medications including NSAIDs and Vanc/Zosyn combo -Currently no indication for HD  Hypertension: Mild elevation in blood pressure.  Continue to monitor with current medications  Anemia: Likely associated with significant illness.  Urinalysis started  Tricuspid MRSA endocarditis: Management per primary team and infectious disease   Recommendations conveyed to primary service.    Darnell Level  [facility] 06/14/2020 8:44 AM  ___________________________________________________________  CC: AKI  Interval History/Subjective: 24-hour urine with 13 g of protein.  Foley catheter placed last night.  Creatinine slightly higher at 3.7.  1.5 L of urine output over the past 24 hours.   Medications:  Current Facility-Administered Medications  Medication  Dose Route Frequency Provider Last Rate Last Admin  . 0.9 %  sodium chloride infusion   Intravenous PRN Jadene Pierini, MD   Stopped at 06/02/20 [phone]  . acetaminophen (TYLENOL) tablet 650 mg  650 mg Oral Q4H PRN Steffanie Rainwater, MD   650 mg at 06/13/20 2018  . albuterol (VENTOLIN HFA) 108 (90 Base) MCG/ACT inhaler 1 puff  1 puff Inhalation Q6H PRN Elige Radon, MD   2 puff at 06/05/20 1559  . alteplase (CATHFLO ACTIVASE) injection 2 mg  2 mg Intracatheter Once Reymundo Poll, MD      . amLODipine (NORVASC) tablet 10 mg  10 mg Oral Daily Ephriam Knuckles, Rylee, MD   10 mg at 06/14/20 7494  . carvedilol (COREG) tablet 6.25 mg  6.25 mg Oral BID WC Evlyn Kanner, MD   6.25 mg at 06/14/20 0605  . Chlorhexidine Gluconate Cloth 2 % PADS 6 each  6 each Topical Daily Reymundo Poll, MD   6 each at 06/13/20 1005  . DAPTOmycin (CUBICIN) 670 mg in sodium chloride 0.9 % IVPB  670 mg Intravenous Q2000 Ike Bene, RPH 226.8 mL/hr at 06/13/20 2023 670 mg at 06/13/20 2023  . enoxaparin (LOVENOX) injection 40 mg  40 mg Subcutaneous Daily Reymundo Poll, MD   40 mg at 06/14/20 4967  . feeding supplement (ENSURE ENLIVE / ENSURE PLUS) liquid 237 mL  237 mL Oral BID BM Reymundo Poll, MD   237 mL at 06/14/20 0821  . gabapentin (NEURONTIN) capsule 600 mg  600 mg Oral QHS Steffanie Rainwater, MD   600 mg at 06/13/20 2135  . guaiFENesin-dextromethorphan (ROBITUSSIN DM) 100-10 MG/5ML syrup 5 mL  5 mL Oral Q4H PRN Marguerita Merles Latif, DO   5 mL at 05/25/20 1648  . HYDROmorphone (DILAUDID) injection 1 mg  1 mg Intravenous Q4H PRN Braswell,  Aneta Mins, MD   1 mg at 06/14/20 0827  . HYDROmorphone (DILAUDID) injection 1 mg  1 mg Intravenous PRN Steffanie Rainwater, MD      . methocarbamol (ROBAXIN) tablet 750 mg  750 mg Oral TID AC & HS Costella, Vincent J, PA-C   750 mg at 06/14/20 0605  . multivitamin with minerals tablet 1 tablet  1 tablet Oral Daily Reymundo Poll, MD   1 tablet at 06/14/20 [phone]  .  oxyCODONE (Oxy IR/ROXICODONE) immediate release tablet 15 mg  15 mg Oral Q4H Reymundo Poll, MD   15 mg at 06/14/20 0518  . sodium chloride flush (NS) 0.9 % injection 10-40 mL  10-40 mL Intracatheter Q12H Reymundo Poll, MD   10 mL at 06/14/20 9604  . sodium chloride flush (NS) 0.9 % injection 10-40 mL  10-40 mL Intracatheter PRN Reymundo Poll, MD   10 mL at 06/14/20 0101      Review of Systems: 10 systems reviewed and negative except per interval history/subjective  Physical Exam: Vitals:   06/14/20 0735 06/14/20 0821  BP: (!) 141/87 135/90  Pulse: 82   Resp: 18   Temp: 98.7 F (37.1 C)   SpO2: 100%    Total I/O In: 20 [I.V.:20] Out: -   Intake/Output Summary (Last 24 hours) at 06/14/2020 0844 Last data filed at 06/14/2020 5409 Gross per 24 hour  Intake 1245.27 ml  Output 1525 ml  Net -279.73 ml   Constitutional: Ill-appearing, lying in bed, no distress ENMT: ears and nose without scars or lesions, MMM CV: normal rate, trace bilateral lower extremity edema Respiratory: Bilateral chest rise, normal work of breathing Gastrointestinal: soft, non-tender, no palpable masses or hernias Skin: no visible lesions or rashes Psych: alert, judgement/insight appropriate, appropriate mood and affect   Test Results I personally reviewed new and old clinical labs and radiology tests Lab Results  Component Value Date   NA 140 06/14/2020   K 4.8 06/14/2020   CL 102 06/14/2020   CO2 25 06/14/2020   BUN 71 (H) 06/14/2020   CREATININE 3.73 (H) 06/14/2020   CALCIUM 8.0 (L) 06/14/2020   ALBUMIN 2.0 (L) 06/14/2020   PHOS 6.3 (H) 06/14/2020

## 2020-06-14 NOTE — Plan of Care (Signed)
  Problem: Clinical Measurements: Goal: Diagnostic test results will improve Outcome: Progressing Goal: Respiratory complications will improve Outcome: Progressing   Problem: Pain Managment: Goal: General experience of comfort will improve Outcome: Progressing   

## 2020-06-14 NOTE — Progress Notes (Addendum)
Subjective:  No significant overnight events. Pain is still significantly limiting pt's mobility. He continues to express concern that he is bothering nursing staff to much. Discussed alternatives including trying a PCA which he would like to try.  Objective:  Vital signs in last 24 hours: Vitals:   06/13/20 2310 06/14/20 0029 06/14/20 0505 06/14/20 0735  BP: 127/85  (!) 155/94 (!) 141/87  Pulse: 88  82 82  Resp: 18   18  Temp: 98.6 F (37 C)  98.9 F (37.2 C) 98.7 F (37.1 C)  TempSrc: Oral  Oral Oral  SpO2: 99%  100% 100%  Weight:  90 kg    Height:        Intake/Output Summary (Last 24 hours) at 06/14/2020 0742 Last data filed at 06/14/2020 0615 Gross per 24 hour  Intake 1225.27 ml  Output 1525 ml  Net -299.73 ml    Physical Exam  General: chronically ill appearing Cardiac: RRR, extremities warm, no LE edema Pulm: lungs clear bilaterally. Breathing comfortably on room air MSK: pain with minimal movement. Moves all extremities Neuro: myoclonic movement throughout all extremities. Alert and oriented.  Assessment/Plan: Steven Cortez is a 45 y.o. 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 Hospital course complicated by acute renal failure requiring HD and large epidural abscess s/p laminectomy x2 (1/29 and 2/11). Now on long course IV abx treatment and pain management.   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 Discussed importance of trying to increase mobility as he has been unable to get out of bed over the past several days due to pain. He is unable to even sit up in bed long enough to eat a meal without having to lay his head back down due to back pain --ID following, appreciate recs --Continue Daptomycin (2/11-4/8). Weekly CK --Pain management: Robaxin 750 mg TID, Gabapentin 600 mg QHS (renally dose adjusted) for spasms and neurogenic pain. Will trial a dilaudid PCA to see if that helps him be able to be more mobile --Bowel management: miralax and senakot --Continue Tylenol prn for fevers  Normocytic hypoproliferative anemia, ACD.  Iron studies from earlier this admission most consistent with ACD. Erythropoetin level from 2/17 is elevated. Received a dose of aranesp 2/19 from nephrology S/p 6 units of pRBC since admission. Hemoglobin stable this morning. --Daily CBC --Start Aranesp weekly (last dose 2/19) per nephrology  Myoclonus (2/17). Progressively worsening since 2/17. Etiology unclear but favor medication induced due to the generalized nature of it being upper and lower extremities Literature review indicates increased incidence in peripheral neuropathies associated with daptomycin in individuals receiving therapy for staph infections and endocarditis.  Alternatively, could consider gabapentin. He is currently receiving the max recommended dose given his renal function.  Consider uremia with progressively declining renal function Plan -Will try to touch base with ID to discuss daptomycin -If considered unlikely, could try to decrease gabapentin  Non-Oliguric progressive AKI  with nephrotic syndrome and microscopic hematuria. Initially required 2 HD runs earlier in admission however renal function recovered. Unfortunately, renal function has continued to decline again since 2/15. Nephrology  re-consulted 2/19 and is working up glomerular pathologies with complement levels 24h urine protein 13g Mild/moderate bilateral hydronephrosis--unchanged since admission Urinary catheter placed 2/20 Plan --repeat renal US tomorrow to re-evaluate hydronephrosis --nephrology considering renal bx if creatinine fails to improve --renal precautions--avoid nephrotoxins, maintain MAP >65, daily weight, strict I/O  Hypertension. Blood pressures stable. Continue coreg and amlodipine  Diet: Renal diet IVF: None VTE: Lovenox CODE: Full Code  Prior to Admission Living Arrangement: Home Anticipated Discharge Location: Home w/ HH Barriers to Discharge: Medical work up Dispo: 1-2 months  Signed: Elige Radon, MD Internal Medicine Resident PGY-2 [facility] Pager: [phone] 06/14/2020 8:19 AM    After 5pm on weekdays and 1pm on weekends: On Call pager: [phone]

## 2020-06-15 ENCOUNTER — Inpatient Hospital Stay (HOSPITAL_COMMUNITY): Payer: Medicaid Other

## 2020-06-15 DIAGNOSIS — G253 Myoclonus: Secondary | ICD-10-CM

## 2020-06-15 LAB — RENAL FUNCTION PANEL
Albumin: 1.9 g/dL — ABNORMAL LOW (ref 3.5–5.0)
Anion gap: 11 (ref 5–15)
BUN: 74 mg/dL — ABNORMAL HIGH (ref 6–20)
CO2: 25 mmol/L (ref 22–32)
Calcium: 7.9 mg/dL — ABNORMAL LOW (ref 8.9–10.3)
Chloride: 104 mmol/L (ref 98–111)
Creatinine, Ser: 3.89 mg/dL — ABNORMAL HIGH (ref 0.61–1.24)
GFR, Estimated: 19 mL/min — ABNORMAL LOW (ref >60)
Glucose, Bld: 95 mg/dL (ref 70–99)
Phosphorus: 6.5 mg/dL — ABNORMAL HIGH (ref 2.5–4.6)
Potassium: 4.6 mmol/L (ref 3.5–5.1)
Sodium: 140 mmol/L (ref 135–145)

## 2020-06-15 LAB — CBC
HCT: 23.7 % — ABNORMAL LOW (ref 39.0–52.0)
Hemoglobin: 7.4 g/dL — ABNORMAL LOW (ref 13.0–17.0)
MCH: 28.5 pg (ref 26.0–34.0)
MCHC: 31.2 g/dL (ref 30.0–36.0)
MCV: 91.2 fL (ref 80.0–100.0)
Platelets: 225 10*3/uL (ref 150–400)
RBC: 2.6 MIL/uL — ABNORMAL LOW (ref 4.22–5.81)
RDW: 15.8 % — ABNORMAL HIGH (ref 11.5–15.5)
WBC: 8.3 10*3/uL (ref 4.0–10.5)
nRBC: 0 % (ref 0.0–0.2)

## 2020-06-15 LAB — RHEUMATOID FACTOR: Rheumatoid fact SerPl-aCnc: <10 IU/mL (ref <14.0)

## 2020-06-15 LAB — CK: Total CK: 69 U/L (ref 49–397)

## 2020-06-15 IMAGING — US US RENAL
1 series · 14 of 25 positions shown · non-contrast
Comparison: [DATE]

CLINICAL DATA: Follow-up hydronephrosis

EXAM:
RENAL / URINARY TRACT ULTRASOUND COMPLETE

[Series 1: us renal · 28 acquisitions, 14 frames shown]
[im 1/28]
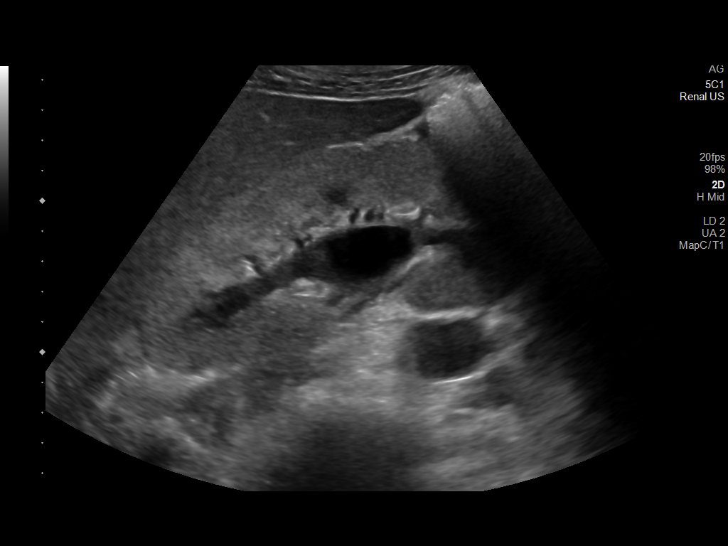
[im 3/28]
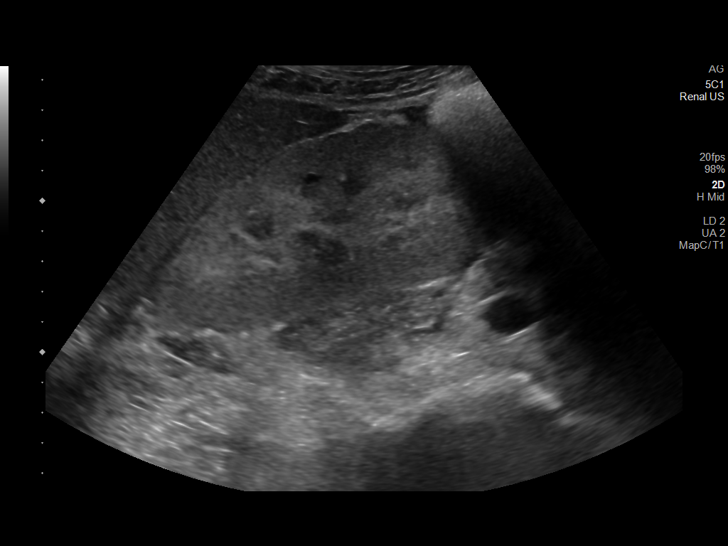
[im 5/28]
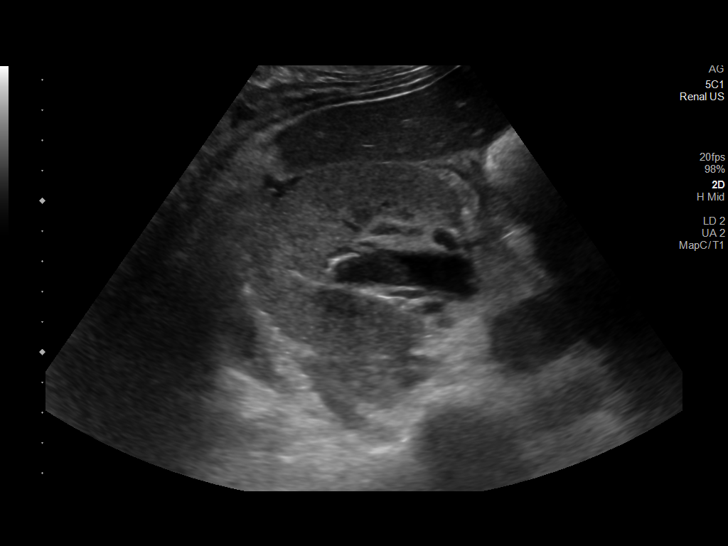
[im 7/28]
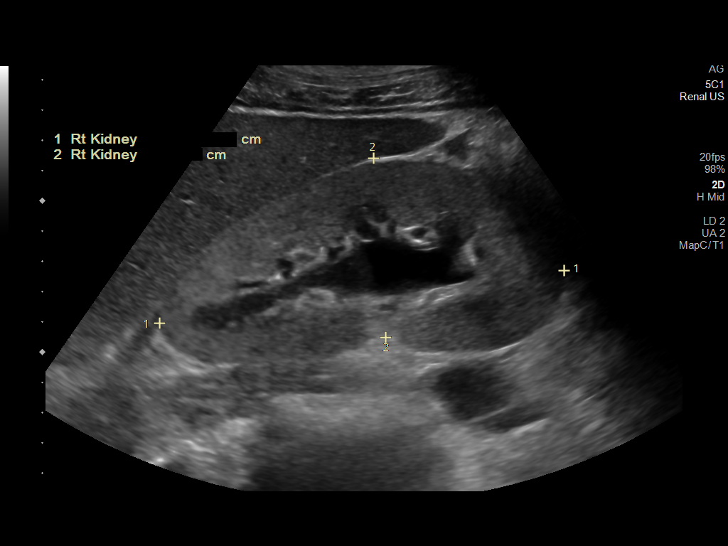
[im 10/28]
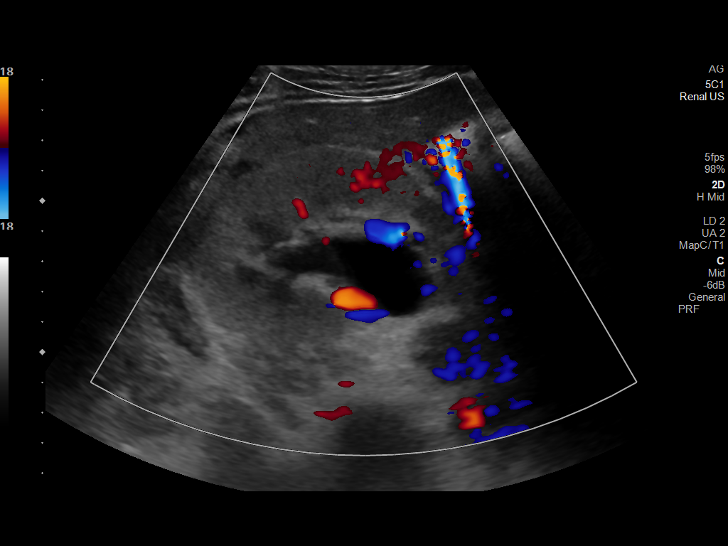
[im 11/28]
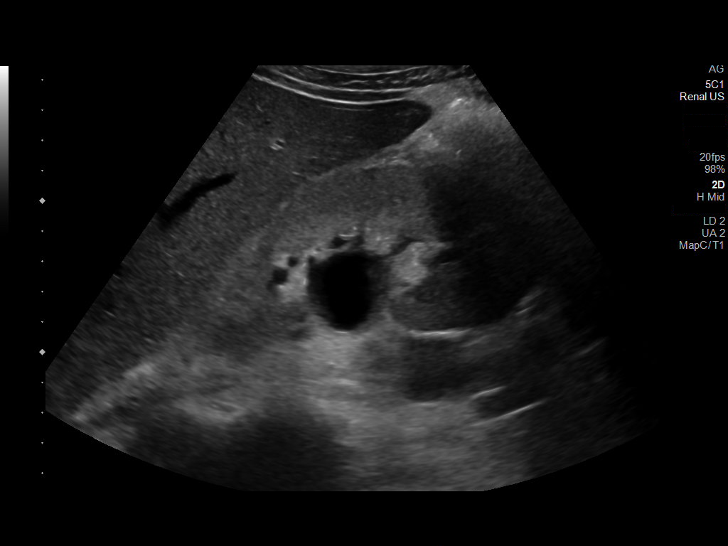
[im 13/28]
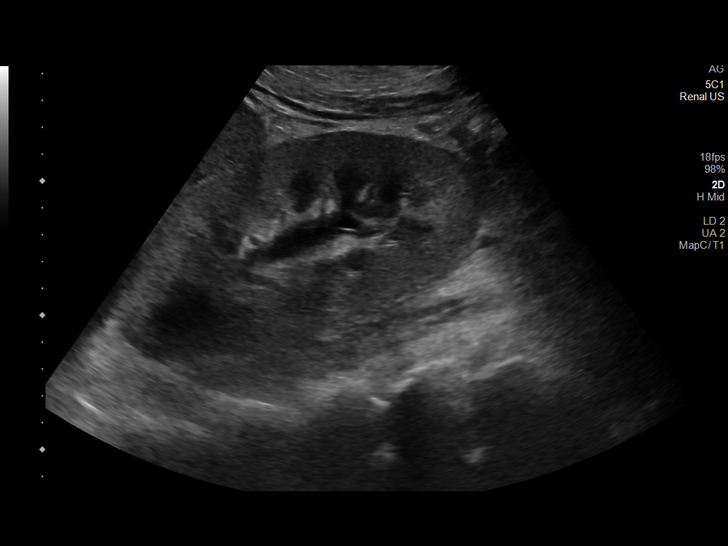
[im 15/28]
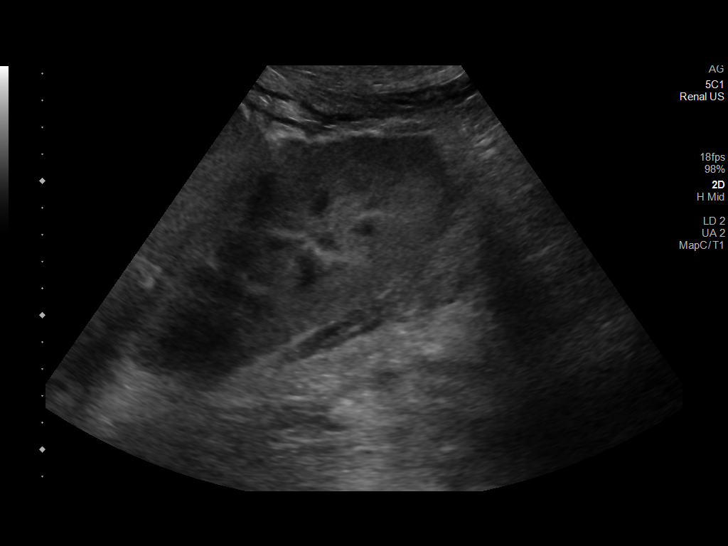
[im 17/28]
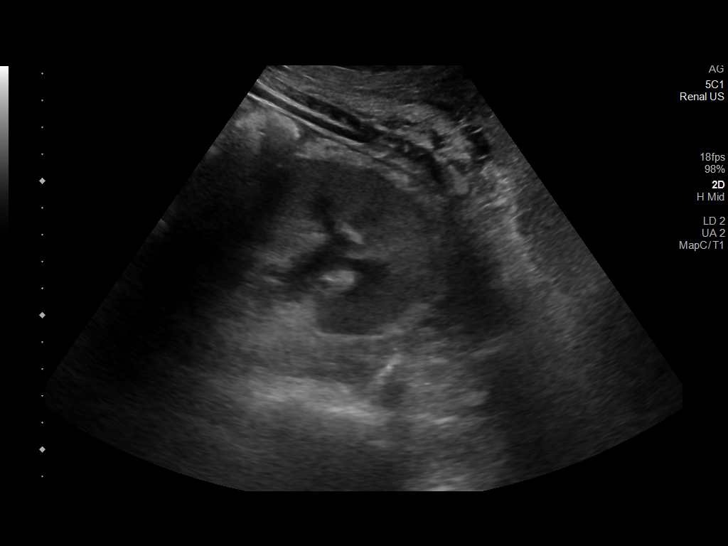
[im 19/28]
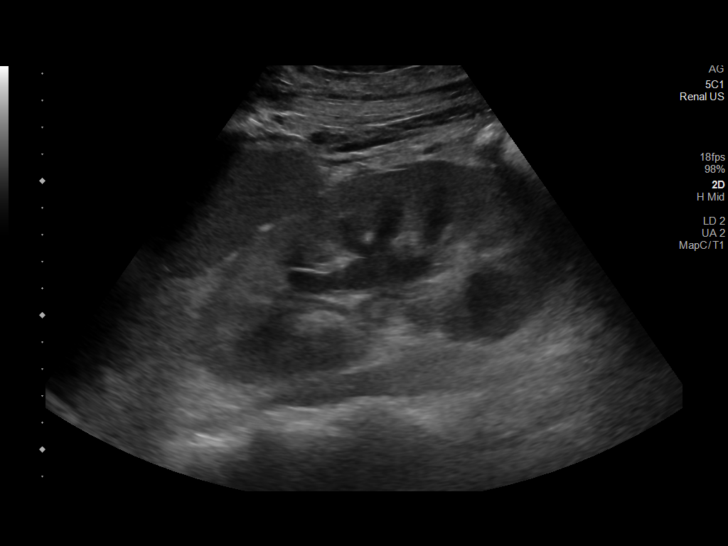
[im 21/28]
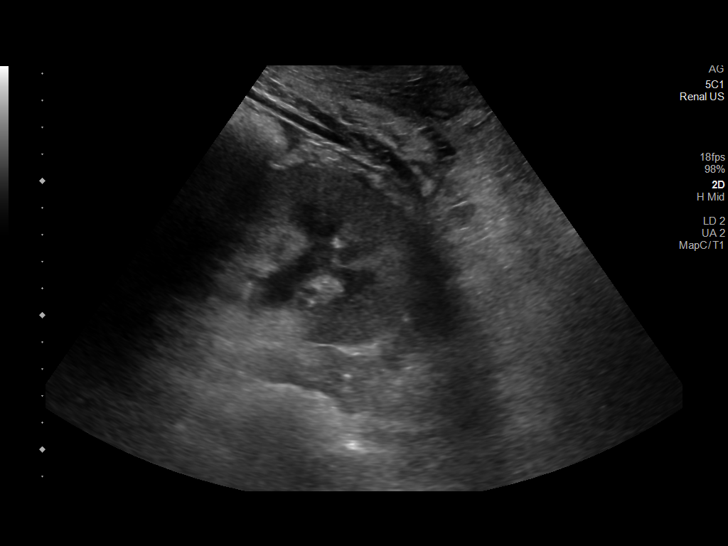
[im 23/28]
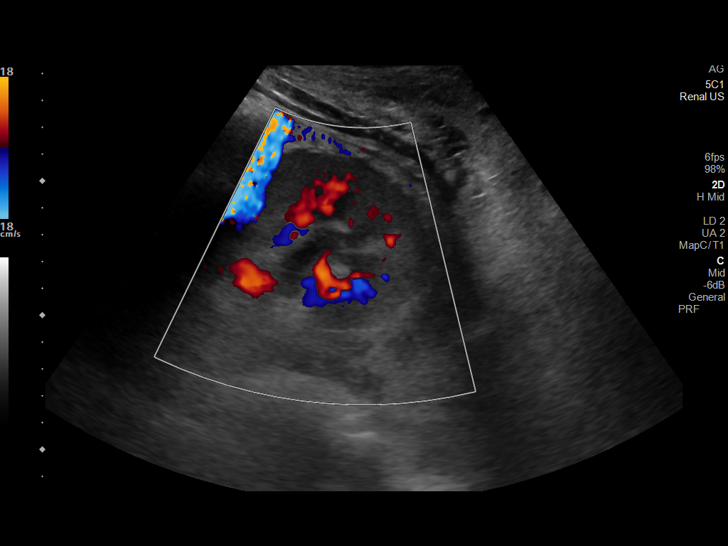
[im 25/28]
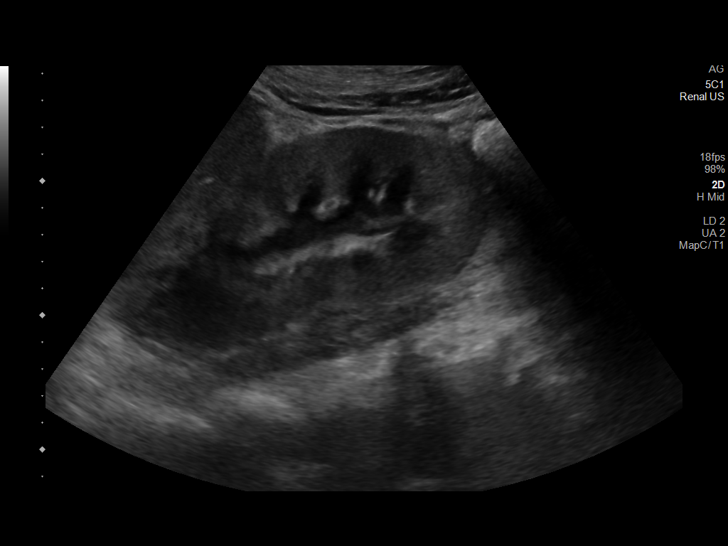
[im 28/28]
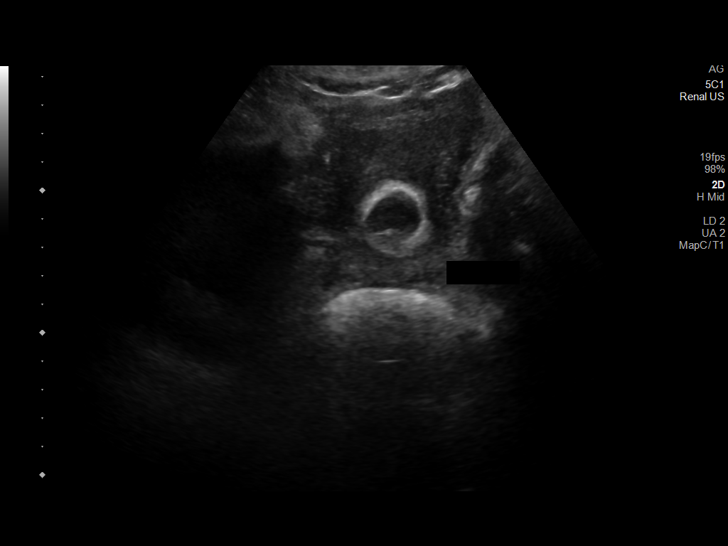

[14 of 25 positions shown; findings below may reference images not displayed]

FINDINGS: Right Kidney:

Renal measurements: 30.5 x 5.9 x 6.9 cm = volume: 288 mL. Normal
cortical thickness and echogenicity. Minimally worsened right
hydronephrosis.

Left Kidney:

Renal measurements: 13.8 x 6.1 x 5.8 cm = volume: 254 mL. Normal
cortical thickness and echogenicity. Minimally worsened left
hydronephrosis.

Bladder:

Collapsed with Foley catheter in place.

Other:

None.
IMPRESSION: Minimal interval increase of bilateral hydronephrosis.

## 2020-06-15 MED ORDER — HYDROMORPHONE 1 MG/ML IV SOLN
INTRAVENOUS | Status: DC
Start: 2020-06-15 — End: 2020-06-29
  Administered 2020-06-16 (×2): 0.3 mg via INTRAVENOUS
  Administered 2020-06-16: 0.9 mg via INTRAVENOUS
  Administered 2020-06-17: 1.5 mg via INTRAVENOUS
  Administered 2020-06-17: 0.9 mg via INTRAVENOUS
  Administered 2020-06-18: 30 mg via INTRAVENOUS
  Administered 2020-06-18 – 2020-06-19 (×3): 0.9 mg via INTRAVENOUS
  Administered 2020-06-19: 0.6 mg via INTRAVENOUS
  Administered 2020-06-20: 0.9 mg via INTRAVENOUS
  Administered 2020-06-20: 0.3 mg via INTRAVENOUS
  Administered 2020-06-21 (×2): 30 mg via INTRAVENOUS
  Administered 2020-06-21: 1.5 mg via INTRAVENOUS
  Administered 2020-06-22: 0 mg via INTRAVENOUS
  Administered 2020-06-22: 0.3 mg via INTRAVENOUS
  Administered 2020-06-22: 0 mg via INTRAVENOUS
  Administered 2020-06-22: 2.1 mg via INTRAVENOUS
  Administered 2020-06-23 (×3): 0.6 mg via INTRAVENOUS
  Administered 2020-06-24: 30 mg via INTRAVENOUS
  Administered 2020-06-24: 0.9 mg via INTRAVENOUS
  Administered 2020-06-24: 1.3 mg via INTRAVENOUS
  Administered 2020-06-25: 1.8 mg via INTRAVENOUS
  Administered 2020-06-26: 0.6 mg via INTRAVENOUS
  Administered 2020-06-26: 1.5 mg via INTRAVENOUS
  Administered 2020-06-26: 0.6 mg via INTRAVENOUS
  Administered 2020-06-26: 1.8 mg via INTRAVENOUS
  Administered 2020-06-27 (×2): 0.6 mg via INTRAVENOUS
  Administered 2020-06-27: 30 mg via INTRAVENOUS
  Administered 2020-06-27: 2.1 mg via INTRAVENOUS
  Administered 2020-06-27: 0.6 mg via INTRAVENOUS
  Administered 2020-06-28: 1.8 mg via INTRAVENOUS
  Administered 2020-06-28: 1.5 mg via INTRAVENOUS
  Administered 2020-06-28: 2.4 mg via INTRAVENOUS
  Administered 2020-06-29: 1.5 mg via INTRAVENOUS
  Administered 2020-06-29: 2.1 mg via INTRAVENOUS
  Administered 2020-06-29: 1.5 mg via INTRAVENOUS
  Filled 2020-06-15 (×4): qty 30

## 2020-06-15 NOTE — TOC Progression Note (Signed)
Transition of Care [facility]) - Progression Note    Patient Details  Name: Steven Cortez MRN: [id number] Date of Birth: December 09, 1975  Transition of Care [facility]) CM/SW Contact  Leone Haven, RN Phone Number: 06/15/2020, 3:30 PM  Clinical Narrative:    Patient is for renal ultrasound today, will be here til he completes iv abx for endocarditis.  TOC will continue to follow for dc needs.        Expected Discharge Plan and Services                                                 Social Determinants of Health (SDOH) Interventions    Readmission Risk Interventions No flowsheet data found.

## 2020-06-15 NOTE — Progress Notes (Signed)
Physical Therapy Treatment Patient Details Name: Steven Cortez MRN: [id number] DOB: [birth year] Today's Date: 06/15/2020    History of Present Illness Pt is a 45 year old male with PMHx including Hep C and IVDU who presented with back pain, weakness, dyspnea, pleuritic chest pain, urinary retention, and pitting edema. He was recently admitted to [facility] and found to have tricuspid endocarditis with MRSA bacteremia, and pulmonary septic emboli but left AMA. 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 s/pTEE 05/28/20.   2/11 repeat evacuation / extension of decompression    PT Comments    Pt making slow progress towards his physical therapy goals, however remains motivated to particiate. Pt now on PCA pump but still struggling with significant pain control issues. Able to stand from edge of bed to walker with min assist. Unable to take steps due to right groin pain and numbness. Worked on bed level stretching and TA contractions for core stabilization. Will continue to progress as tolerated. Discussed with MD.   Follow Up Recommendations  No PT follow up;Supervision for mobility/OOB     Equipment Recommendations  Rolling walker with 5" wheels;3in1 (PT)    Recommendations for Other Services       Precautions / Restrictions Precautions Precautions: Fall;Back;Other (comment) Precaution Comments: PCA Restrictions Weight Bearing Restrictions: No    Mobility  Bed Mobility Overal bed mobility: Needs Assistance Bed Mobility: Rolling;Sidelying to Sit;Sit to Sidelying Rolling: Supervision Sidelying to sit: Supervision     Sit to sidelying: Supervision General bed mobility comments: Good log roll technique, no physical assist, just significant time/effort to progress to edge of bed. Use of bed rails    Transfers Overall transfer level: Needs  assistance Equipment used: Rolling walker (2 wheeled) Transfers: Sit to/from Stand Sit to Stand: Min assist         General transfer comment: MinA to rise to stand  Ambulation/Gait                 Stairs             Wheelchair Mobility    Modified Rankin (Stroke Patients Only)       Balance Overall balance assessment: Needs assistance Sitting-balance support: Feet supported;Bilateral upper extremity supported Sitting balance-Leahy Scale: Fair     Standing balance support: Bilateral upper extremity supported Standing balance-Leahy Scale: Poor Standing balance comment: reliant on external support                            Cognition Arousal/Alertness: Awake/alert Behavior During Therapy: WFL for tasks assessed/performed Overall Cognitive Status: Within Functional Limits for tasks assessed                                        Exercises General Exercises - Lower Extremity Ankle Circles/Pumps: PROM;Both;5 reps;Supine Other Exercises Other Exercises: Supine:TA contractions (5 s hold) x 5 Other Exercises: Supine: manual bilateral hamstring stretch x 1 minute each    General Comments        Pertinent Vitals/Pain Pain Assessment: Faces Faces Pain Scale: Hurts worst Pain Location: R groin Pain Descriptors / Indicators: Guarding;Grimacing;Spasm;Sharp;Numbness Pain Intervention(s): Limited activity within patient's tolerance;Monitored during session;PCA encouraged    Home Living  Prior Function            PT Goals (current goals can now be found in the care plan section) Acute Rehab PT Goals Patient Stated Goal: home, back to independent PT Goal Formulation: With patient Time For Goal Achievement: 06/22/20 Potential to Achieve Goals: Good Progress towards PT goals: Progressing toward goals    Frequency    Min 3X/week      PT Plan Current plan remains appropriate     Co-evaluation              AM-PAC PT "6 Clicks" Mobility   Outcome Measure  Help needed turning from your back to your side while in a flat bed without using bedrails?: None Help needed moving from lying on your back to sitting on the side of a flat bed without using bedrails?: None Help needed moving to and from a bed to a chair (including a wheelchair)?: None Help needed standing up from a chair using your arms (e.g., wheelchair or bedside chair)?: A Little Help needed to walk in hospital room?: A Little Help needed climbing 3-5 steps with a railing? : A Lot 6 Click Score: 20    End of Session   Activity Tolerance: Patient limited by pain Patient left: in bed;with call bell/phone within reach Nurse Communication: Mobility status PT Visit Diagnosis: Unsteadiness on feet (R26.81);Pain     Time: 4098-1191 PT Time Calculation (min) (ACUTE ONLY): 33 min  Charges:  $Therapeutic Activity: 23-37 mins                     Lillia Pauls, PT, DPT Acute Rehabilitation Services Pager [phone] Office [phone]    Norval Morton 06/15/2020, 4:04 PM

## 2020-06-15 NOTE — Progress Notes (Signed)
Subjective:   Hospital day: 25  Overnight event: Patient started on PCA yesterday but reported discomfort from capnography monitoring.  This morning, patient states he tried the PCA overnight but is not used to it so his pain was not controlled as it was before the previous pain regimen. He reports not pressing the PCA pump as much as he is allowed. He continues to be motivated to get out of bed work with PT but pain and spasms continue to limit his ability to do these. He will continue to try the PCA with an increase in the dose of the Dilaudid.  Objective:  Vital signs in last 24 hours: Vitals:   06/14/20 2321 06/15/20 0043 06/15/20 0422 06/15/20 0545  BP:  133/80 140/90   Pulse:  85 84   Resp: 16 14 14 14   Temp:  99.9 F (37.7 C) 98.8 F (37.1 C)   TempSrc:  Oral Oral   SpO2: 97% 97% 99% 96%  Weight:  92.5 kg    Height:        Filed Weights   06/13/20 0312 06/14/20 0029 06/15/20 0043  Weight: 88.4 kg 90 kg 92.5 kg     Intake/Output Summary (Last 24 hours) at 06/15/2020 0818 Last data filed at 06/15/2020 0500 Gross per 24 hour  Intake 530 ml  Output 1425 ml  Net -895 ml   Net IO Since Admission: -24,833.65 mL [06/15/20 0818]  No results for input(s): GLUCAP in the last 72 hours.   Pertinent Labs: CBC Latest Ref Rng & Units 06/15/2020 06/14/2020 06/13/2020  WBC 4.0 - 10.5 K/uL 8.3 8.6 -  Hemoglobin 13.0 - 17.0 g/dL 7.4(L) 8.0(L) 7.6(L)  Hematocrit 39.0 - 52.0 % 23.7(L) 25.4(L) 23.0(L)  Platelets 150 - 400 K/uL 225 217 -    CMP Latest Ref Rng & Units 06/15/2020 06/14/2020 06/13/2020  Glucose 70 - 99 mg/dL 95 96 06/15/2020)  BUN 6 - 20 mg/dL 834(H) 96(Q) 22(L)  Creatinine 0.61 - 1.24 mg/dL 79(G) 9.21(J) 9.41(D)  Sodium 135 - 145 mmol/L 140 140 139  Potassium 3.5 - 5.1 mmol/L 4.6 4.8 4.4  Chloride 98 - 111 mmol/L 104 102 102  CO2 22 - 32 mmol/L 25 25 23   Calcium 8.9 - 10.3 mg/dL 7.9(L) 8.0(L) 7.8(L)  Total Protein 6.5 - 8.1 g/dL - - -  Total Bilirubin 0.3 - 1.2 mg/dL  - - -  Alkaline Phos 38 - 126 U/L - - -  AST 15 - 41 U/L - - -  ALT 0 - 44 U/L - - -    Imaging: 4.08(X RENAL  Result Date: 06/15/2020 CLINICAL DATA:  Follow-up hydronephrosis EXAM: RENAL / URINARY TRACT ULTRASOUND COMPLETE COMPARISON:  06/12/2020 FINDINGS: Right Kidney: Renal measurements: 30.5 x 5.9 x 6.9 cm = volume: 288 mL. Normal cortical thickness and echogenicity. Minimally worsened right hydronephrosis. Left Kidney: Renal measurements: 13.8 x 6.1 x 5.8 cm = volume: 254 mL. Normal cortical thickness and echogenicity. Minimally worsened left hydronephrosis. Bladder: Collapsed with Foley catheter in place. Other: None. IMPRESSION: Minimal interval increase of bilateral hydronephrosis. Electronically Signed   By: 06/17/2020 M.D.   On: 06/15/2020 09:07   Physical Exam  General:  Middle-age man laying comfortably in bed. CV: RRR. No M/R/G. Trace BLE edema Pulmonary:  Lungs CTAB on anterior chest wall.  Normal effort Abdominal:  Soft.  NT/ND.  Normal bowel sounds MSK: Intermittently jerking movements of the upper and lower extremities Extremities: Well perfused. Skin: Warm and dry.  No obvious  rashes or lesions. Neuro:  Alert and oriented.  Moves all extremities. BLE clonus 3-4 beats.   Assessment/Plan: Steven Cortez is a 45 y.o. 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 Hospital course complicated by acute renal failure requiring HD and large epidural abscess s/p laminectomy x2 (1/29 and 2/11). Now on long course IV abx treatment and pain management.   Principal Problem:   Endocarditis Active Problems:   Urinary retention   AKI (acute kidney injury) (HCC)   Hyperkalemia   Microcytic anemia   MRSA infection   Epidural abscess   Opioid use disorder   Hydronephrosis   Malnutrition of moderate degree   Antibiotic long-term use   Chronic viral hepatitis B without delta agent and without coma  (HCC)  MRSA tricuspid endocarditis Complicated by septic emboli and epidural abscess s/p I&D w/ laminectomy (x2).  Patient has not had a fever in the past 24 hours. Leukocytosis resolved. Repeat CK within normal limits. --ID following, appreciate recs --Continue daptomycin (2/11-4/8) --Follow-up CK  Non-Oliguric progressive AKI Nephrotic range proteinuria Microscopic hematuria Renal function continues to worsen even after placing Foley over the weekend. GFR down to 19. Repeat renal U/S shows worsening hydronephrosis. Patient making appropriate urine output. C3/C4 within normal limits. In the setting of elevated urine protein and normal complements, differential now includes ANCA-associated glomeruolonephropathy, IgA nephropathy, lupus nephropathy or membranous nephropathy. Plan to re-consult urology for possible cystourethroscopy to assess for possible obstruction further up. Can consider kidney biopsy if kiney function continues to worsen.  --Nephrology following, appreciate recs --Urology consulted, appreciate assistance --Continue foley --F/u ANCA studies, ANA --Daily RFP --Strict I&Os --Avoid nephrotoxic agents  Pain control: Attempted PCA overnight. On review, patient received a maximum dose of Dilaudid overnight.  --Increase PCA dilaudid max dose to 1.2 mg/hr with ability to press every 10 min for 0.3 mg of medication.  --Continue Robaxin   Normocytic hypoproliferative anemia Anemia of chronic disease S/p 6 units of blood transfusion and 1 dose of Aranesp (2/19). Hgb still trending down (8.0>>7.4). EPO levels elevated but patient unable to produce appropriate amounts of hemoglobin.  --Continue daily CBC --Monitor for signs of active bleeds --Transfuse if Hgb <7  Myoclonus (2/17). Found to have myoclonus since 2/17. Likely 2/2 uremia versus medication induced (daptomycin or gabapentin). In the setting of renal dysfunction, gabapentin metabolites can build up causing myoclonic  symptoms. Plan to discontinue gabapentin and monitor for possible improvement. --Discontinue gabapentin --Continue to monitor closely  Hypertension. BP stable overnight with current BP of 140/90. Continue Coreg and amlodipine.  Diet: Renal diet IVF: None VTE: Lovenox CODE: Full Code  Prior to Admission Living Arrangement: Home Anticipated Discharge Location: Home w/ HH Barriers to Discharge: Medical work up Dispo: Anticipated discharge in approximately 2-3 day(s).   Signed: Steffanie Rainwater, MD 06/15/2020, 8:18 AM  Pager: [phone] Internal Medicine Teaching Service After 5pm on weekdays and 1pm on weekends: On Call pager: [phone]

## 2020-06-15 NOTE — Plan of Care (Signed)
  Problem: Clinical Measurements: Goal: Respiratory complications will improve Outcome: Progressing Goal: Cardiovascular complication will be avoided Outcome: Progressing   Problem: Nutrition: Goal: Adequate nutrition will be maintained Outcome: Progressing   Problem: Elimination: Goal: Will not experience complications related to urinary retention Outcome: Progressing   

## 2020-06-15 NOTE — Progress Notes (Signed)
Nephrology Follow-Up Consult note   Assessment/Recommendations: Steven Cortez is a/an 45 y.o. 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     Non-Oliguric AKI: ATN requiring dialysis early in the hospitalization now no longer requiring dialysis.  Creatinine improved to 1.5 but steadily rising creatinine for several days.  Also now with significant proteinuria 13 g.  Also with bilateral hydronephrosis on renal ultrasound.  Possible intermittent urinary retention related to infectious issues of the spine.  Concern for recent rising creatinine possibly related to hydronephrosis but periinfectious GN also possible. Note complement normal --------------- -Continue Foley catheter  - May consider biopsy if the patient's creatinine fails to improve, concern for infection related GN.  Note setting of endocarditis and IVDU  - send ANA, ANCA  Hydronephrosis, bilateral - minimally worsened on [place] 2/21 with bladder collapsed with foley in place - would consult urology as hydro minimally worsened on repeat US with worsening renal function  Hypertension: acceptable control on current regimen   Normocytic Anemia: setting of significant illness  Tricuspid MRSA endocarditis: Management per primary team and infectious disease  ____________________________________________________   Interval History/Subjective:  He offers little additional hx. Wearing oxygen across forehead on arrival and we moved to his nose.   Review of systems:  May be limited 2/2 patient  Denies shortness of breath or chest pain  Denies n/v    Medications:  Current Facility-Administered Medications  Medication Dose Route Frequency Provider Last Rate Last Admin  . 0.9 %  sodium chloride infusion   Intravenous PRN Steven Pierini, MD   Stopped at 06/02/20 [phone]  . acetaminophen (TYLENOL) tablet  650 mg  650 mg Oral Q4H PRN Steven Rainwater, MD   650 mg at 06/13/20 2018  . albuterol (VENTOLIN HFA) 108 (90 Base) MCG/ACT inhaler 1 puff  1 puff Inhalation Q6H PRN Steven Radon, MD   2 puff at 06/05/20 1559  . alteplase (CATHFLO ACTIVASE) injection 2 mg  2 mg Intracatheter Once Steven Poll, MD      . amLODipine (NORVASC) tablet 10 mg  10 mg Oral Daily Steven Cortez, Rylee, MD   10 mg at 06/15/20 0940  . carvedilol (COREG) tablet 6.25 mg  6.25 mg Oral BID WC Steven Kanner, MD   6.25 mg at 06/15/20 0941  . Chlorhexidine Gluconate Cloth 2 % PADS 6 each  6 each Topical Daily Steven Poll, MD   6 each at 06/14/20 1247  . DAPTOmycin (CUBICIN) 670 mg in sodium chloride 0.9 % IVPB  670 mg Intravenous Q48H Steven Cortez, Minh Q, RPH-CPP      . diphenhydrAMINE (BENADRYL) injection 12.5 mg  12.5 mg Intravenous Q6H PRN Steven Cortez, Rylee, MD       Or  . diphenhydrAMINE (BENADRYL) 12.5 MG/5ML elixir 12.5 mg  12.5 mg Oral Q6H PRN Steven Cortez, Rylee, MD      . enoxaparin (LOVENOX) injection 30 mg  30 mg Subcutaneous Daily Steven Poll, MD   30 mg at 06/15/20 0945  . feeding supplement (ENSURE ENLIVE / ENSURE PLUS) liquid 237 mL  237 mL Oral BID BM Steven Poll, MD   237 mL at 06/15/20 0944  . HYDROmorphone (DILAUDID) 1 mg/mL PCA injection   Intravenous Q4H Steven Cortez, Rylee, MD   30 mg at 06/14/20 1354  . methocarbamol (ROBAXIN) tablet 750 mg  750 mg Oral TID AC & HS Steven Cortez, Steven J, PA-C   750 mg  at 06/15/20 0941  . multivitamin with minerals tablet 1 tablet  1 tablet Oral Daily Steven Poll, MD   1 tablet at 06/15/20 0940  . naloxone Steven Cortez) injection 0.4 mg  0.4 mg Intravenous PRN Steven Cortez, Rylee, MD       And  . sodium chloride flush (NS) 0.9 % injection 9 mL  9 mL Intravenous PRN Steven Cortez, Rylee, MD      . ondansetron (ZOFRAN) injection 4 mg  4 mg Intravenous Q6H PRN Steven Cortez, Rylee, MD      . polyethylene glycol (MIRALAX / GLYCOLAX) packet 17 g  17 g Oral Daily Steven Cortez,  Rylee, MD   17 g at 06/14/20 1348  . senna-docusate (Senokot-S) tablet 2 tablet  2 tablet Oral BID Steven Radon, MD   2 tablet at 06/14/20 2300  . sodium chloride flush (NS) 0.9 % injection 10-40 mL  10-40 mL Intracatheter Q12H Steven Poll, MD   10 mL at 06/15/20 0950  . sodium chloride flush (NS) 0.9 % injection 10-40 mL  10-40 mL Intracatheter PRN Steven Poll, MD   10 mL at 06/14/20 0101     Physical Exam: Vitals:   06/15/20 0545 06/15/20 0800  BP:  (!) 149/90  Pulse:    Resp: 14 17  Temp:  98.5 F (36.9 C)  SpO2: 96% 97%   Total I/O In: -  Out: 500 [Urine:500]  Intake/Output Summary (Last 24 hours) at 06/15/2020 1105 Last data filed at 06/15/2020 0844 Gross per 24 hour  Intake 270 ml  Output 1925 ml  Net -1655 ml   Constitutional: Ill-appearing, lying in bed, no distress  ENMT: ears and nose without scars or lesions, MMM CV: normal rate, trace to 1+ bilateral lower extremity edema Respiratory: clear and unlabored at rest; on oxygen nasal cannula  Gastrointestinal: soft, non-tender; nd Neuro agitation noted - moved oxygen from forehead to nose; answers limited questions; no acute distress     Test Results I personally reviewed new and old clinical labs and radiology tests Lab Results  Component Value Date   NA 140 06/15/2020   K 4.6 06/15/2020   CL 104 06/15/2020   CO2 25 06/15/2020   BUN 74 (H) 06/15/2020   CREATININE 3.89 (H) 06/15/2020   CALCIUM 7.9 (L) 06/15/2020   ALBUMIN 1.9 (L) 06/15/2020   PHOS 6.5 (H) 06/15/2020     Steven Emms, MD 06/15/2020 11:28 AM

## 2020-06-15 NOTE — Consult Note (Signed)
Reconsulted on patient's worsening renal fxn and bilateral hydro.  He was originally seen May 21, 2020.  He had a history of endocarditis and MRSA bacteremia.  He had a paraphimosis that was reduced after a Foley catheter was placed.  He also underwent a renal ultrasound May 21, 2020 which showed mild to moderate right > left bilateral hydronephrosis with a creatinine of 7.5.  His bladder contained about 750 cc of urine when a Foley was placed and he denies any prior history of weak stream or painful inability to void. He did require dialysis for short time but he had good urine output and his creatinine improved all the way to 1.45 on 06/02/2020 about the time foley was discontinued.   Since foley removed Cr has risen. A renal ultrasound 06/03/2020 showed mild - mod right hydronephrosis and minimal left without a foley and about 400 ml in bladder with Cr 1.8.   A CT scan 06/05/2020 showed moderate right hydronephrosis and mild left hydronephrosis again without the Foley. No renal or ureteral stone. Prostate without BPH. Creatinine at this point was 1.95 up slightly.  Creatinine continued to rise up to 2.89 and a 06/12/2020 renal ultrasound again revealed moderate right and mild left hydro with about 300 cc in the bladder.  No Foley.  Foley catheter replaced around 06/13/2020 at a creatinine of 3.3.  His creatinine today, 06/15/2020 has risen to 3.89 with repeat ultrasound showing bladder decompressed with Foley and moderate right and mild left hydronephrosis about the same as the CT without the foley.  Again he does not perceive an issue emptying his bladder. He did note red urine today in the bag. He is not ambulating he says due to back pain.    PE: . Vitals:   06/15/20 1150 06/15/20 1229  BP: (!) 191/147   Pulse:    Resp: 12 14  Temp: 98.2 F (36.8 C)   SpO2:  97%   . Marland Kitchen  Intake/Output Summary (Last 24 hours) at 06/15/2020 1845 Last data filed at 06/15/2020 1841 Gross per 24 hour   Intake 840 ml  Output 2200 ml  Net -1360 ml   NAD, watching TV Abd soft, NT 14 Fr foley in place. Normal foreskin.  Light pink in tube, red in bag  A/P: Acute on chronic renal failure with bilateral hydronephrosis-this does seem to be related to a chronic process and likely reflux +/- decreased bladder compliance. The hydro is not severe on the imaging (calyces are not distended). Not sure how much the hydro is contributing to his overall renal decline although his Cr did improve with foley and declined when foley removed. Also his bladder is distended in every imaging study without a foley. Now with foley, the creatinine is not improving but his UOP is excellent. We will continue to monitor urine output and creatinine and then consider cystoscopy with retrogrades and possible ureteral stents if creatinine continues to rise. Continue foley.   Gross hematuria - developed p foley replaced. This may also be related to decompressing a chronically distended bladder. Will need cystoscopy at some point (inpateint vs outpatient).   Will follow along.

## 2020-06-16 LAB — CBC
HCT: 25.2 % — ABNORMAL LOW (ref 39.0–52.0)
Hemoglobin: 7.7 g/dL — ABNORMAL LOW (ref 13.0–17.0)
MCH: 27.7 pg (ref 26.0–34.0)
MCHC: 30.6 g/dL (ref 30.0–36.0)
MCV: 90.6 fL (ref 80.0–100.0)
Platelets: 219 10*3/uL (ref 150–400)
RBC: 2.78 MIL/uL — ABNORMAL LOW (ref 4.22–5.81)
RDW: 15.7 % — ABNORMAL HIGH (ref 11.5–15.5)
WBC: 9.5 10*3/uL (ref 4.0–10.5)
nRBC: 0 % (ref 0.0–0.2)

## 2020-06-16 LAB — RENAL FUNCTION PANEL
Albumin: 1.9 g/dL — ABNORMAL LOW (ref 3.5–5.0)
Anion gap: 13 (ref 5–15)
BUN: 72 mg/dL — ABNORMAL HIGH (ref 6–20)
CO2: 25 mmol/L (ref 22–32)
Calcium: 7.9 mg/dL — ABNORMAL LOW (ref 8.9–10.3)
Chloride: 102 mmol/L (ref 98–111)
Creatinine, Ser: 3.75 mg/dL — ABNORMAL HIGH (ref 0.61–1.24)
GFR, Estimated: 19 mL/min — ABNORMAL LOW (ref >60)
Glucose, Bld: 122 mg/dL — ABNORMAL HIGH (ref 70–99)
Phosphorus: 6.1 mg/dL — ABNORMAL HIGH (ref 2.5–4.6)
Potassium: 4.2 mmol/L (ref 3.5–5.1)
Sodium: 140 mmol/L (ref 135–145)

## 2020-06-16 LAB — CULTURE, BLOOD (ROUTINE X 2)
Culture: NO GROWTH
Culture: NO GROWTH
Special Requests: ADEQUATE
Special Requests: ADEQUATE

## 2020-06-16 LAB — MPO/PR-3 (ANCA) ANTIBODIES
ANCA Proteinase 3: <3.5 U/mL (ref 0.0–3.5)
Myeloperoxidase Abs: <9 U/mL (ref 0.0–9.0)

## 2020-06-16 LAB — ANA: Anti Nuclear Antibody (ANA): NEGATIVE

## 2020-06-16 MED ORDER — POLYETHYLENE GLYCOL 3350 17 G PO PACK
17.0000 g | PACK | Freq: Two times a day (BID) | ORAL | Status: DC
  Administered 2020-06-16 – 2020-07-11 (×14): 17 g via ORAL
  Filled 2020-06-16 (×41): qty 1

## 2020-06-16 MED ORDER — CALCIUM CARBONATE ANTACID 500 MG PO CHEW
1.0000 | CHEWABLE_TABLET | Freq: Three times a day (TID) | ORAL | Status: DC
  Administered 2020-06-16 – 2020-06-26 (×25): 200 mg via ORAL
  Filled 2020-06-16 (×28): qty 1

## 2020-06-16 MED ORDER — CARVEDILOL 12.5 MG PO TABS
12.5000 mg | ORAL_TABLET | Freq: Two times a day (BID) | ORAL | Status: DC
  Administered 2020-06-16 – 2020-06-20 (×8): 12.5 mg via ORAL
  Filled 2020-06-16 (×8): qty 1

## 2020-06-16 MED ORDER — FERROUS SULFATE 325 (65 FE) MG PO TABS
325.0000 mg | ORAL_TABLET | Freq: Every day | ORAL | Status: DC
  Administered 2020-06-17 – 2020-06-26 (×9): 325 mg via ORAL
  Filled 2020-06-16 (×9): qty 1

## 2020-06-16 MED ORDER — SODIUM CHLORIDE 0.9 % IV SOLN: INTRAVENOUS | Status: AC

## 2020-06-16 MED ORDER — DARBEPOETIN ALFA 40 MCG/0.4ML IJ SOSY
40.0000 ug | PREFILLED_SYRINGE | Freq: Once | INTRAMUSCULAR | Status: AC
  Administered 2020-06-16: 40 ug via SUBCUTANEOUS
  Filled 2020-06-16: qty 0.4

## 2020-06-16 NOTE — Progress Notes (Addendum)
Occupational Therapy Treatment Patient Details Name: Steven Cortez MRN: [id number] DOB: 12-Feb-1976 Today's Date: 06/16/2020    History of present illness Pt is a 45 year old male with PMHx including Hep C and IVDU who presented with back pain, weakness, dyspnea, pleuritic chest pain, urinary retention, and pitting edema. 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 s/pTEE 05/28/20.   2/11 repeat evacuation / extension of decompression   OT comments  Pt limited by pain this session, reporting 8/10 back pain and declining OOB mobility. Session focus on BUE therex with level 1 theraband to increased strength and AROM for higher level BADLs and functional mobility. Pt completed therex as indicated below with no reports of increased pain. Issued pt handout to increase carryover.Pt would continue to benefit from skilled occupational therapy while admitted and after d/c to address the below listed limitations in order to improve overall functional mobility and facilitate independence with BADL participation. DC plan remains appropriate, will follow acutely per POC.    Follow Up Recommendations  Supervision/Assistance - 24 hour;SNF    Equipment Recommendations  3 in 1 bedside commode    Recommendations for Other Services      Precautions / Restrictions Precautions Precautions: Fall;Back;Other (comment) Precaution Booklet Issued: No Precaution Comments: PCA Restrictions Weight Bearing Restrictions: No       Mobility Bed Mobility               General bed mobility comments: pt declined OOB mobility    Transfers                 General transfer comment: pt declined    Balance                                            ADL either performed or assessed with clinical judgement   ADL   Eating/Feeding: Supervision/ safety;Set up;Sitting Eating/Feeding Details (indicate cue type and reason): pt attempting to self feed from supine, assisted with elevated HOB for better positioning for self feeding                       Toilet Transfer Details (indicate cue type and reason): pt declined OOB mobility           General ADL Comments: pt declined OOB mobility secondary to 8/10 pain, session focus on BUE therex to increase strength and ROM for higher level BADLs and functional mobility tasks     Vision       Perception     Praxis      Cognition Arousal/Alertness: Awake/alert Behavior During Therapy: WFL for tasks assessed/performed Overall Cognitive Status: Within Functional Limits for tasks assessed                                 General Comments: Internally distracted by pain.        Exercises General Exercises - Upper Extremity Shoulder Flexion: AROM;Both;10 reps;Supine;Theraband Theraband Level (Shoulder Flexion): Level 1 (Yellow) Shoulder Extension: AROM;Both;10 reps;Supine;Theraband Theraband Level (Shoulder Extension): Level 1 (Yellow) Shoulder Horizontal ABduction: AROM;Both;10 reps;Supine;Theraband Theraband  Level (Shoulder Horizontal Abduction): Level 1 (Yellow) Shoulder Horizontal ADduction: AROM;Both;10 reps;Supine;Theraband Theraband Level (Shoulder Horizontal Adduction): Level 1 (Yellow) Elbow Flexion: AROM;Both;10 reps;Supine;Theraband Theraband Level (Elbow Flexion): Level 1 (Yellow) Elbow Extension: AROM;Both;10 reps;Supine;Theraband Theraband Level (Elbow Extension): Level 1 (Yellow) Other Exercises Other Exercises: BUE punches with level 1 theraband from supine   Shoulder Instructions       General Comments      Pertinent Vitals/ Pain       Pain Assessment: 0-10 Pain Score: 8  Pain Location:  back Pain Descriptors / Indicators: Guarding;Grimacing;Spasm;Sharp;Discomfort Pain Intervention(s): Monitored during session;Limited activity within patient's tolerance;Repositioned  Home Living                                          Prior Functioning/Environment              Frequency  Min 2X/week        Progress Toward Goals  OT Goals(current goals can now be found in the care plan section)  Progress towards OT goals: Progressing toward goals  Acute Rehab OT Goals Patient Stated Goal: to get rid of pain Time For Goal Achievement: 06/25/20 Potential to Achieve Goals: Good  Plan Discharge plan remains appropriate;Frequency remains appropriate    Co-evaluation                 AM-PAC OT "6 Clicks" Daily Activity     Outcome Measure   Help from another person eating meals?: A Little (s/u) Help from another person taking care of personal grooming?: A Little Help from another person toileting, which includes using toliet, bedpan, or urinal?: Total Help from another person bathing (including washing, rinsing, drying)?: A Lot Help from another person to put on and taking off regular upper body clothing?: A Lot Help from another person to put on and taking off regular lower body clothing?: Total 6 Click Score: 12    End of Session Equipment Utilized During Treatment: Other (comment) (level 1 theraband)  OT Visit Diagnosis: Other abnormalities of gait and mobility (R26.89);Muscle weakness (generalized) (M62.81);Pain   Activity Tolerance Patient tolerated treatment well;Patient limited by pain   Patient Left in bed;with call bell/phone within reach;with bed alarm set   Nurse Communication Mobility status        Time: 4765-4650 OT Time Calculation (min): 8 min  Charges: OT General Charges $OT Visit: 1 Visit OT Treatments $Therapeutic Exercise: 8-22 mins  Lenor Derrick., COTA/L Acute Rehabilitation  Services [phone]   Barron Schmid 06/16/2020, 11:10 AM

## 2020-06-16 NOTE — Progress Notes (Signed)
Nephrology Follow-Up Consult note   Assessment/Recommendations: Steven Cortez is a/an 45 y.o. 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     Non-Oliguric AKI: ATN requiring dialysis early in the hospitalization now no longer requiring dialysis.  Creatinine improved to 1.5 but steadily rising creatinine for several days.  Also now with significant proteinuria 13 g.  Also with bilateral hydronephrosis on renal ultrasound.  Possible intermittent urinary retention related to infectious issues of the spine.  Concern for recent rising creatinine possibly related to hydronephrosis but periinfectious GN also possible. Note complement normal --------------- -Continue Foley catheter  - NS at 75/hr x 24 hours - May consider biopsy if the patient's creatinine fails to improve, concern for infection related GN.  Note setting of endocarditis and IVDU is not ideal  - ANA, ANCA pending - change to renal diet  Hydronephrosis, bilateral - minimally worsened on [place] 2/21 with bladder collapsed with foley in place.  consulted urology as hydro minimally worsened on repeat US with worsening renal function - appreciate urology  - continue foley   Hypertension: increase coreg to 12.5 mg BID  Normocytic Anemia: setting of significant illness and iron deficiency.  Start oral iron.  aranesp 40 mcg once on 2/22  Tricuspid MRSA endocarditis: Management per primary team and infectious disease  Hyperphosphatemia - change to renal diet and start tums with meals for now  Myoclonic jerks - med list reviewed - no neurontin, lyrica, or cefepime.  May be secondary to renal failure.  Appreciate primary team looking into as well    ____________________________________________________   Interval History/Subjective:  Urology was consulted yesterday.  They are considering possible cystocopy  and possible ureteral stents if cr continues to rise.  He had 2.9 liters UOP over 2/21.  States he's noticed a jerking movement for a couple of days  Review of systems:  Denies shortness of breath or chest pain  Denies n/v  Urinating via Foley   Medications:  Current Facility-Administered Medications  Medication Dose Route Frequency Provider Last Rate Last Admin  . 0.9 %  sodium chloride infusion   Intravenous PRN Jadene Pierini, MD   Stopped at 06/02/20 [phone]  . acetaminophen (TYLENOL) tablet 650 mg  650 mg Oral Q4H PRN Steffanie Rainwater, MD   650 mg at 06/13/20 2018  . albuterol (VENTOLIN HFA) 108 (90 Base) MCG/ACT inhaler 1 puff  1 puff Inhalation Q6H PRN Elige Radon, MD   2 puff at 06/05/20 1559  . alteplase (CATHFLO ACTIVASE) injection 2 mg  2 mg Intracatheter Once Reymundo Poll, MD      . amLODipine (NORVASC) tablet 10 mg  10 mg Oral Daily Ephriam Knuckles, Rylee, MD   10 mg at 06/16/20 0859  . carvedilol (COREG) tablet 6.25 mg  6.25 mg Oral BID WC Evlyn Kanner, MD   6.25 mg at 06/16/20 0859  . Chlorhexidine Gluconate Cloth 2 % PADS 6 each  6 each Topical Daily Reymundo Poll, MD   6 each at 06/16/20 [phone]  . DAPTOmycin (CUBICIN) 670 mg in sodium chloride 0.9 % IVPB  670 mg Intravenous Q48H Pham, Minh Q, RPH-CPP 226.8 mL/hr at 06/15/20 2119 670 mg at 06/15/20 2119  . diphenhydrAMINE (BENADRYL) injection 12.5 mg  12.5 mg Intravenous Q6H PRN Christian, Rylee, MD       Or  . diphenhydrAMINE (BENADRYL) 12.5 MG/5ML elixir 12.5 mg  12.5 mg Oral  Q6H PRN Elige Radon, MD      . enoxaparin (LOVENOX) injection 30 mg  30 mg Subcutaneous Daily Reymundo Poll, MD   30 mg at 06/16/20 0900  . feeding supplement (ENSURE ENLIVE / ENSURE PLUS) liquid 237 mL  237 mL Oral BID BM Reymundo Poll, MD   237 mL at 06/16/20 0905  . HYDROmorphone (DILAUDID) 1 mg/mL PCA injection   Intravenous Q4H Christian, Rylee, MD      . methocarbamol (ROBAXIN) tablet 750 mg  750 mg Oral TID AC & HS  Costella, Vincent J, PA-C   750 mg at 06/16/20 1033  . multivitamin with minerals tablet 1 tablet  1 tablet Oral Daily Reymundo Poll, MD   1 tablet at 06/16/20 0859  . naloxone [facility]) injection 0.4 mg  0.4 mg Intravenous PRN Christian, Rylee, MD       And  . sodium chloride flush (NS) 0.9 % injection 9 mL  9 mL Intravenous PRN Christian, Rylee, MD      . ondansetron (ZOFRAN) injection 4 mg  4 mg Intravenous Q6H PRN Christian, Rylee, MD      . polyethylene glycol (MIRALAX / GLYCOLAX) packet 17 g  17 g Oral Daily Christian, Rylee, MD   17 g at 06/14/20 1348  . senna-docusate (Senokot-S) tablet 2 tablet  2 tablet Oral BID Elige Radon, MD   2 tablet at 06/15/20 2118  . sodium chloride flush (NS) 0.9 % injection 10-40 mL  10-40 mL Intracatheter Q12H Reymundo Poll, MD   10 mL at 06/16/20 0901  . sodium chloride flush (NS) 0.9 % injection 10-40 mL  10-40 mL Intracatheter PRN Reymundo Poll, MD   10 mL at 06/14/20 0101     Physical Exam: Vitals:   06/16/20 0817 06/16/20 0859  BP:  (!) 169/100  Pulse:    Resp: (!) 21   Temp:    SpO2: 95%    No intake/output data recorded.  Intake/Output Summary (Last 24 hours) at 06/16/2020 1140 Last data filed at 06/16/2020 0600 Gross per 24 hour  Intake 1300 ml  Output 2350 ml  Net -1050 ml   Constitutional: Ill-appearing, lying in bed, no distress  ENMT: NCAT; MMM CV: S1S2; no rub 1+ bilateral lower extremity edema Respiratory: clear and unlabored at rest; on oxygen nasal cannula 2 liters Gastrointestinal: soft, non-tender; nd Neuro awake and conversant; jerking of extremities intermittently  Psych - less agitation GU - foley in place with hematuria   Test Results I personally reviewed new and old clinical labs and radiology tests Lab Results  Component Value Date   NA 140 06/16/2020   K 4.2 06/16/2020   CL 102 06/16/2020   CO2 25 06/16/2020   BUN 72 (H) 06/16/2020   CREATININE 3.75 (H) 06/16/2020   CALCIUM 7.9 (L)  06/16/2020   ALBUMIN 1.9 (L) 06/16/2020   PHOS 6.1 (H) 06/16/2020     Estanislado Emms, MD 06/16/2020 11:57 AM

## 2020-06-16 NOTE — Progress Notes (Signed)
Patient's urine has be bright red.

## 2020-06-16 NOTE — Progress Notes (Signed)
Subjective:   Hospital day: 26  Overnight event: No acute events overnight.   This morning, patient states he feels tired but pain was a little better controlled compared to the previous night. He requested an increase of his doses. He denies any SOB, CP or abd pain. He does agree that his stomach is a little distended but he reports he is having daily BMs.   Objective:  Vital signs in last 24 hours: Vitals:   06/15/20 2347 06/16/20 0040 06/16/20 0408 06/16/20 0541  BP:  (!) 152/92 135/86   Pulse:  85 85   Resp: 16 16 18 15   Temp:  98.9 F (37.2 C) 98.4 F (36.9 C)   TempSrc:  Oral Oral   SpO2: 98% 100% 100% 97%  Weight:  90.9 kg    Height:        Filed Weights   06/14/20 0029 06/15/20 0043 06/16/20 0040  Weight: 90 kg 92.5 kg 90.9 kg     Intake/Output Summary (Last 24 hours) at 06/16/2020 0704 Last data filed at 06/16/2020 0600 Gross per 24 hour  Intake 1420 ml  Output 2850 ml  Net -1430 ml   Net IO Since Admission: -26,263.65 mL [06/16/20 0704]  No results for input(s): GLUCAP in the last 72 hours.   Pertinent Labs: CBC Latest Ref Rng & Units 06/16/2020 06/15/2020 06/14/2020  WBC 4.0 - 10.5 K/uL 9.5 8.3 8.6  Hemoglobin 13.0 - 17.0 g/dL 7.7(L) 7.4(L) 8.0(L)  Hematocrit 39.0 - 52.0 % 25.2(L) 23.7(L) 25.4(L)  Platelets 150 - 400 K/uL 219 225 217    CMP Latest Ref Rng & Units 06/15/2020 06/14/2020 06/13/2020  Glucose 70 - 99 mg/dL 95 96 06/15/2020)  BUN 6 - 20 mg/dL 448(J) 85(U) 31(S)  Creatinine 0.61 - 1.24 mg/dL 97(W) 2.63(Z) 8.58(I)  Sodium 135 - 145 mmol/L 140 140 139  Potassium 3.5 - 5.1 mmol/L 4.6 4.8 4.4  Chloride 98 - 111 mmol/L 104 102 102  CO2 22 - 32 mmol/L 25 25 23   Calcium 8.9 - 10.3 mg/dL 7.9(L) 8.0(L) 7.8(L)  Total Protein 6.5 - 8.1 g/dL - - -  Total Bilirubin 0.3 - 1.2 mg/dL - - -  Alkaline Phos 38 - 126 U/L - - -  AST 15 - 41 U/L - - -  ALT 0 - 44 U/L - - -    Imaging: 5.02(D RENAL  Result Date: 06/15/2020 CLINICAL DATA:  Follow-up  hydronephrosis EXAM: RENAL / URINARY TRACT ULTRASOUND COMPLETE COMPARISON:  06/12/2020 FINDINGS: Right Kidney: Renal measurements: 30.5 x 5.9 x 6.9 cm = volume: 288 mL. Normal cortical thickness and echogenicity. Minimally worsened right hydronephrosis. Left Kidney: Renal measurements: 13.8 x 6.1 x 5.8 cm = volume: 254 mL. Normal cortical thickness and echogenicity. Minimally worsened left hydronephrosis. Bladder: Collapsed with Foley catheter in place. Other: None. IMPRESSION: Minimal interval increase of bilateral hydronephrosis. Electronically Signed   By: 06/17/2020 M.D.   On: 06/15/2020 09:07   Physical Exam  General:  Middle-aged man laying in bed. Mildly somnolent today.  CV: RRR. No m/r/g. 1+ BLE edema Pulmonary: On 2LNC. Mild expiratory wheezes on anterior chest wall. Normal effort Abdominal: Soft. NT. Mild distention. Hyperactive BS.  MSK: Intermittent jerking unchanged.  Extremities: Warm. Well-perfused  Skin: No obvious rashes Neuro: Alert and oriented x 3. No focal deficits.   Assessment/Plan: Steven Cortez is a 45 y.o. male with hx of IVDU, HTN and untreated hep C who presented for an evaluation of tricuspid valve endocarditis and MRSA  bacteremia. Hospital course complicated by acute renal failure requiring HD and large epidural abscess s/p laminectomy x2 (1/29 and 2/11). Now on long course IV abx treatment and pain management.   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  Remains afebrile. No leukocytosis in the last 5 days. ID signed off. Plan to see in the outptient  after long course of antibiotic treatment.  --Continue Daptomycin (2/11-4/8) --Continue weekly CK checks  Non-Oliguric progressive AKI Nephrotic range proteinuria Microscopic hematuria Renal function stable. Creatinine starting to trend down (3.89>>3.75) but GFR unchanged. Patient making appropriate urine output.  Patient would likely need renal biopsy to help confirm possible IgA nephropathy versus postinfectious glomerulonephritis.  --Nephro following, recommending avoidance of ACE/ARB in the setting of AKI --ANCA and ANA pending --Continue Foley --Strict I&O's, daily weights --Daily RFP --Continue renal diet  Hydronephrosis  Renal ultrasound yesterday showed worsening bilateral hydronephrosis.  Patient continues to make appropriate urine output.  Urology consulted yesterday and recommended monitoring for now with plan for cystoscopy with retrogrades and possible ureteral stent in the future if kidney function continues to worsen. --Urology following, appreciate recs --Continue Foley --Strict I&O's  Pain control: PCA dose increased yesterday from 1 mg/hr to 1.2 mg/hr. Overnight, patient states his pain was better control compared to the previous night. On med review, patient has received 3 mg of IV dilaudid in the last 24 hrs. Patient with mild abdominal distention today likely secondary to constipation.  He is having 1 BM per day. --Continue PCA at 0.3 mg doses w/ 10 min lockouts and max doses of 1.2 mg/hr.  --Continue Robaxin  --Continue Senokot --Increase MiraLAX to twice daily  Normocytic hypoproliferative anemia Anemia of chronic disease Hemoglobin stable at 7.7. Will continue to monitor. --Start oral iron per nephro recs --Daily CBC  Myoclonus (2/17). Myoclonic jerks relatively unchanged. Discontinued Gabapentin yesterday. Likely an element of uremia with elevated BUN. Will continue to monitor for improvements. --Can consider another HD to see if this  improves  Hypertension. BP stable overnight with systolic in the 130s. Slightly elevated to systolic in the 150s this morning. Continue to work on pain control.  --Continue coreg and amlodipine  Diet: Renal diet IVF: None VTE: Lovenox CODE: Full Code  Prior to Admission Living Arrangement: Home Anticipated Discharge Location: Home w/ HH Barriers to Discharge: Medical work up Dispo: Anticipated discharge in approximately 2-3 day(s).   Signed: Steffanie Rainwater, MD 06/16/2020, 7:04 AM  Pager: [phone] Internal Medicine Teaching Service After 5pm on weekdays and 1pm on weekends: On Call pager: [phone]

## 2020-06-16 NOTE — Plan of Care (Signed)

## 2020-06-16 NOTE — Progress Notes (Signed)
Nutrition Follow-up  DOCUMENTATION CODES:   Non-severe (moderate) malnutrition in context of chronic illness  INTERVENTION:   -Liberalize diet with 2 gram sodium with 1.2 L fluid restriction -Continue MVI with minerals daily -Continue Ensure Enlive po BID, each supplement provides 350 kcal and 20 grams of protein -Continue Magic cup TID with meals, each supplement provides 290 kcal and 9 grams of protein  NUTRITION DIAGNOSIS:   Moderate Malnutrition related to chronic illness (IV drug abuse) as evidenced by energy intake < or equal to 75% for > or equal to 1 month,mild muscle depletion,mild fat depletion.  Ongoing  GOAL:   Patient will meet greater than or equal to 90% of their needs  Progressing   MONITOR:   PO intake,Supplement acceptance,Labs,Weight trends,Skin,I & O's  REASON FOR ASSESSMENT:   LOS    ASSESSMENT:   Steven Cortez is a 45 y.o. male with hx of IV drug use and untreated Hep C who was diagnosed with endocarditis and MRSA bacteremia at [facility], left AMA then presented to [facility] a few days later. Also found to have large epidural abscess, s/p laminectomy and evacuation by neurosurgery on 1/29. Symptoms and leukocytosis are improving with antibiotics.  1/30- first HD treatment 2/1- second HD treatment 2/3- s/p TEE- revealed large tricuspid valve vegetation, moderate to severe TR, and possible tiny PFO 2/4- HD cath removed 2/11- s/p PROCEDURE:Repeat L4-5, L5-S1 MIS laminectomies, evacuation of epiduralphlegmon  Reviewed I/O's: -1.4 L x 24 hours and -11.2L since 06/02/20  Per CVTS notes, plan to hold off on angio VAC debridement until completion of IV antibiotics.   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 If creatinine fails to improve, pt may require kidney biopsy.   Per urology notes, pt with blood tinged urine catheter output and will require  cystoscopy at some point (inpatient vs outpatient).  Pt continues with good appetite. Noted meal completions 50-100%. Due to pt's malnutrition, since renal diet is very restrictive, will liberalize diet to 2 gram sodium for increased variety of food selections to optimize oral intake.   Per MD, notes, anticipate prolonged hospitalization for completion of IV antibiotics.   Medications reviewed and include dilaudid, miralax, and senokot.   Labs reviewed.   Diet Order:   Diet Order            Diet renal with fluid restriction Fluid restriction: 1800 mL Fluid; Room service appropriate? Yes; Fluid consistency: Thin  Diet effective now                 EDUCATION NEEDS:   Education needs have been addressed  Skin:  Skin Assessment: Skin Integrity Issues: Skin Integrity Issues:: Incisions Incisions: closed back  Last BM:  06/15/20  Height:   Ht Readings from Last 1 Encounters:  06/05/20 5\' 10"  (1.778 m)    Weight:   Wt Readings from Last 1 Encounters:  06/16/20 90.9 kg    Ideal Body Weight:  75.5 kg  BMI:  Body mass index is 28.75 kg/m.  Estimated Nutritional Needs:   Kcal:  2300-2500  Protein:  135-155 grams  Fluid:  > 2 L    06/18/20, RD, LDN, CDCES Registered Dietitian II Certified Diabetes Care and Education Specialist Please refer to [facility] for RD and/or RD on-call/weekend/after hours pager

## 2020-06-17 ENCOUNTER — Inpatient Hospital Stay (HOSPITAL_COMMUNITY): Payer: Medicaid Other

## 2020-06-17 LAB — TYPE AND SCREEN
ABO/RH(D): O POS
Antibody Screen: NEGATIVE
Unit division: 0

## 2020-06-17 LAB — RENAL FUNCTION PANEL
Albumin: 1.7 g/dL — ABNORMAL LOW (ref 3.5–5.0)
Anion gap: 10 (ref 5–15)
BUN: 65 mg/dL — ABNORMAL HIGH (ref 6–20)
CO2: 26 mmol/L (ref 22–32)
Calcium: 7.4 mg/dL — ABNORMAL LOW (ref 8.9–10.3)
Chloride: 104 mmol/L (ref 98–111)
Creatinine, Ser: 3.51 mg/dL — ABNORMAL HIGH (ref 0.61–1.24)
GFR, Estimated: 21 mL/min — ABNORMAL LOW (ref >60)
Glucose, Bld: 101 mg/dL — ABNORMAL HIGH (ref 70–99)
Phosphorus: 5.9 mg/dL — ABNORMAL HIGH (ref 2.5–4.6)
Potassium: 4.3 mmol/L (ref 3.5–5.1)
Sodium: 140 mmol/L (ref 135–145)

## 2020-06-17 LAB — BPAM RBC
Blood Product Expiration Date: 202203212359
Unit Type and Rh: 5100

## 2020-06-17 LAB — CBC
HCT: 23.6 % — ABNORMAL LOW (ref 39.0–52.0)
Hemoglobin: 7.3 g/dL — ABNORMAL LOW (ref 13.0–17.0)
MCH: 28.1 pg (ref 26.0–34.0)
MCHC: 30.9 g/dL (ref 30.0–36.0)
MCV: 90.8 fL (ref 80.0–100.0)
Platelets: 206 10*3/uL (ref 150–400)
RBC: 2.6 MIL/uL — ABNORMAL LOW (ref 4.22–5.81)
RDW: 15.9 % — ABNORMAL HIGH (ref 11.5–15.5)
WBC: 10.9 10*3/uL — ABNORMAL HIGH (ref 4.0–10.5)
nRBC: 0 % (ref 0.0–0.2)

## 2020-06-17 LAB — MAGNESIUM: Magnesium: 2 mg/dL (ref 1.7–2.4)

## 2020-06-17 IMAGING — US US RENAL
1 series · 14 of 25 positions shown · non-contrast
Comparison: None.

CLINICAL DATA: Hydronephrosis.

EXAM:
RENAL / URINARY TRACT ULTRASOUND COMPLETE

[Series 1: us renal · 14 of 41 slices shown]
[im 1/41]
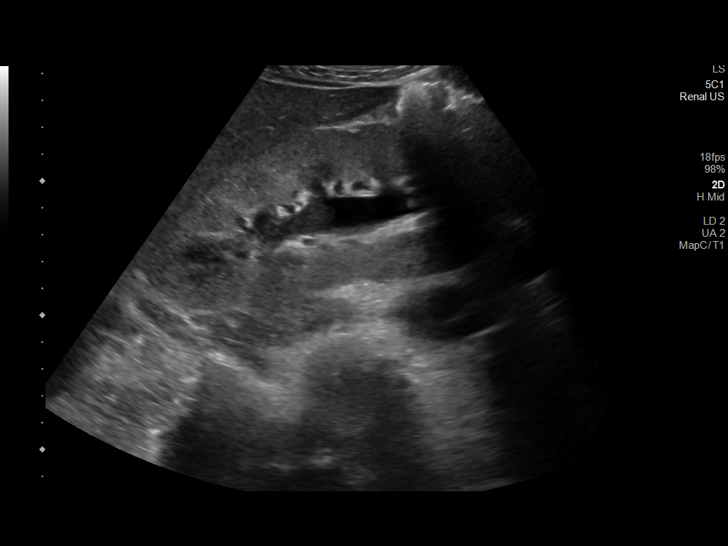
[im 4/41]
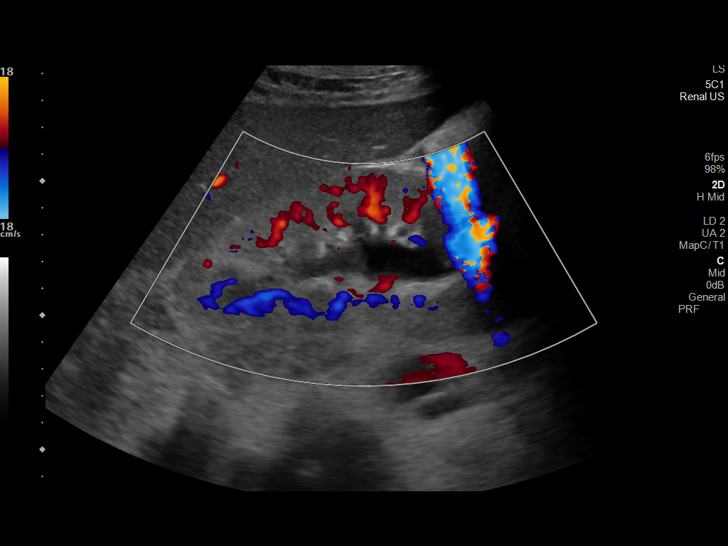
[im 7/41]
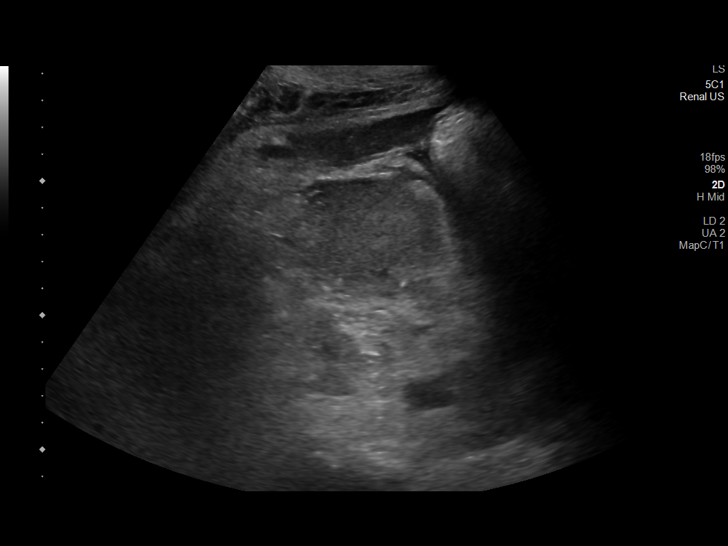
[im 11/41]
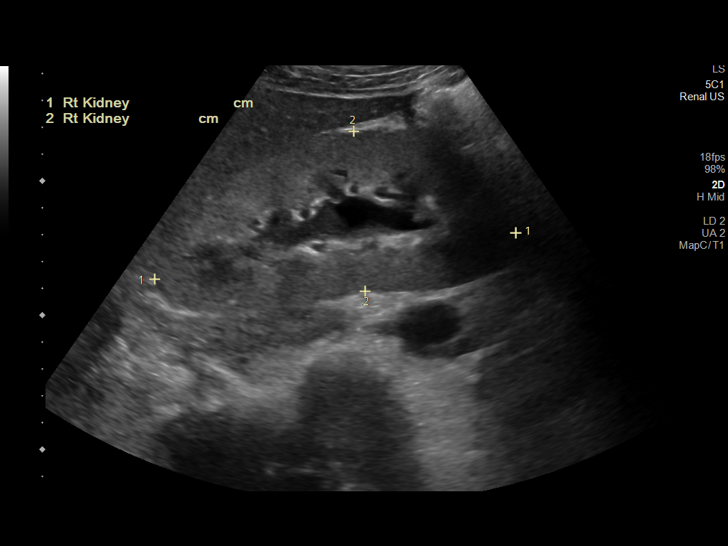
[im 14/41]
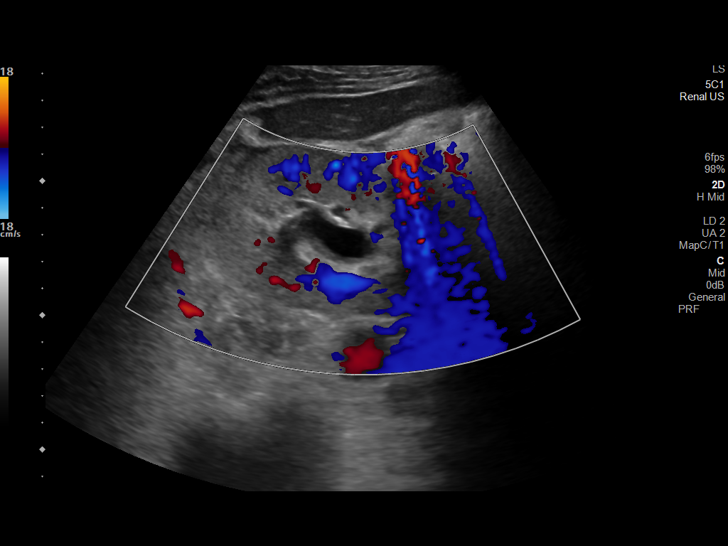
[im 16/41]
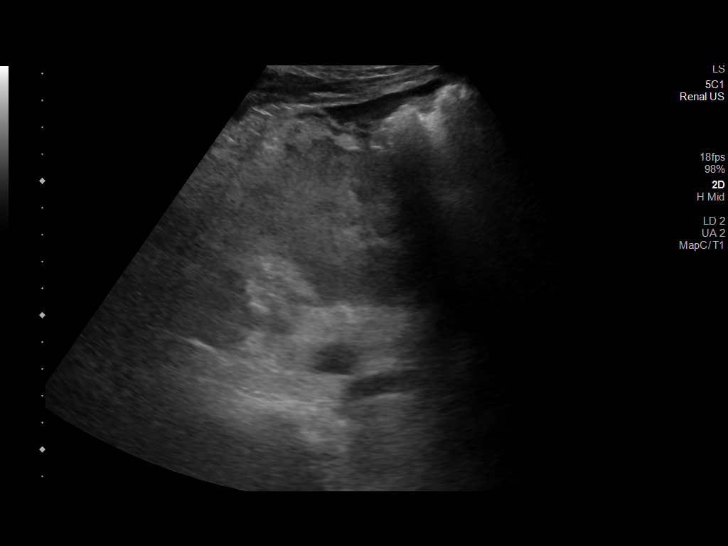
[im 19/41]
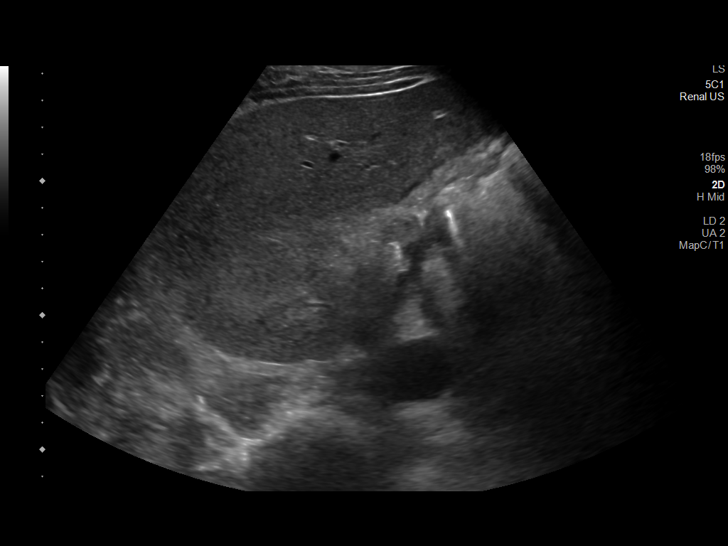
[im 22/41]
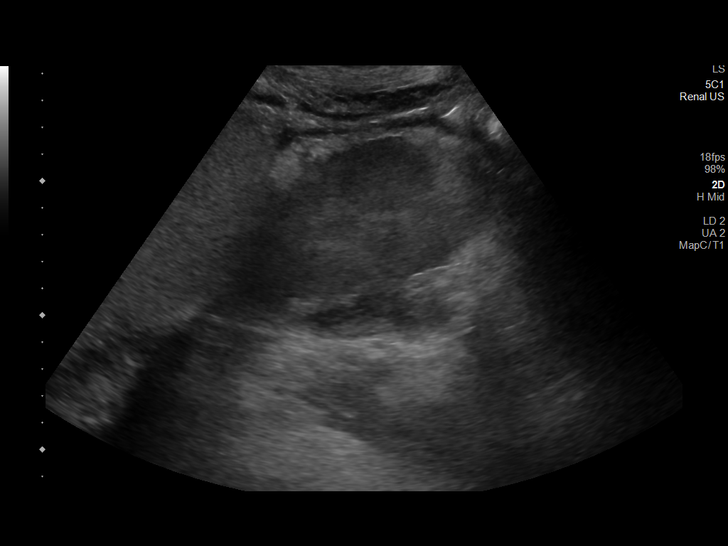
[im 26/41]
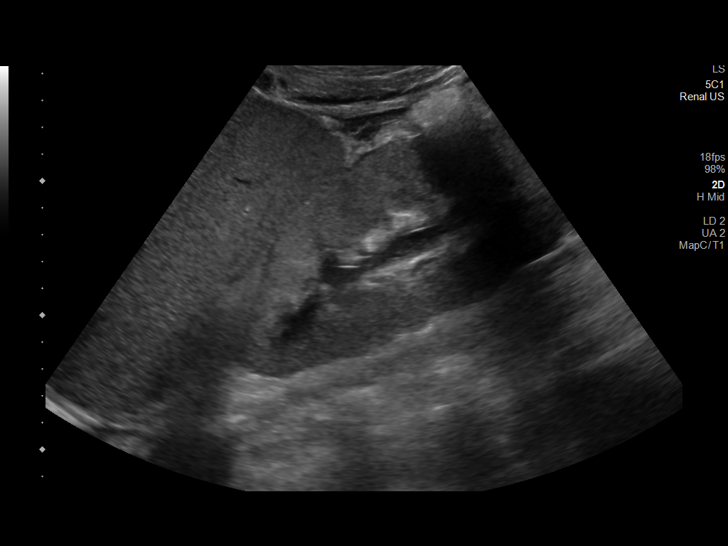
[im 27/41]
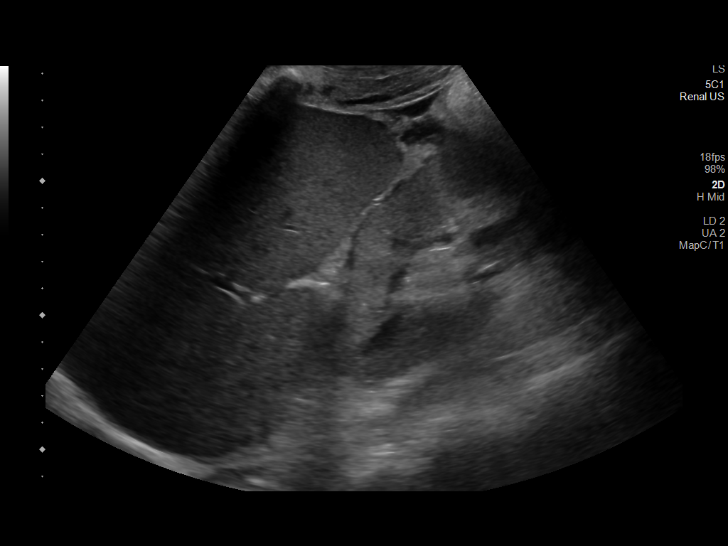
[im 31/41]
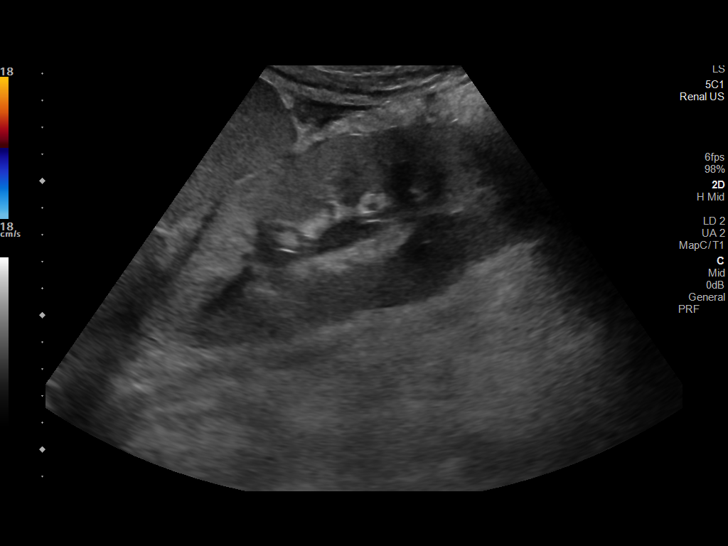
[im 34/41]
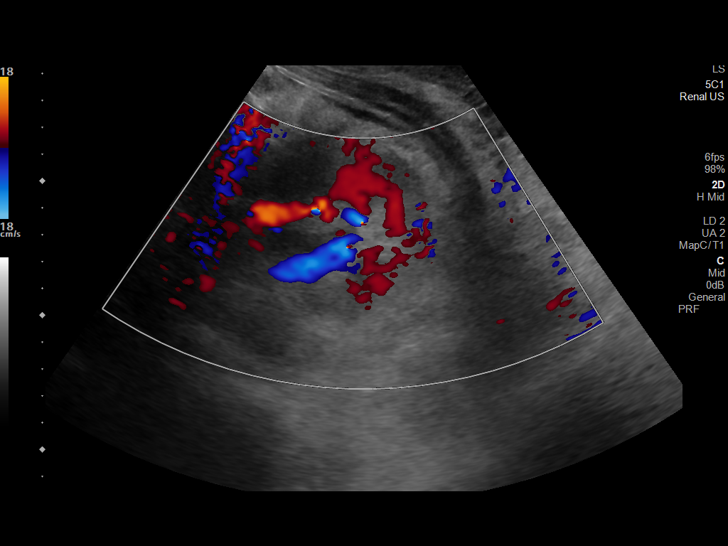
[im 37/41]
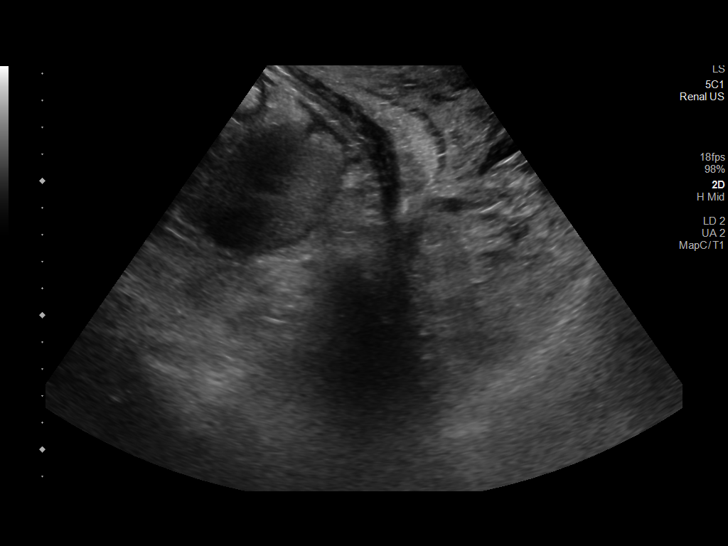
[im 41/41]
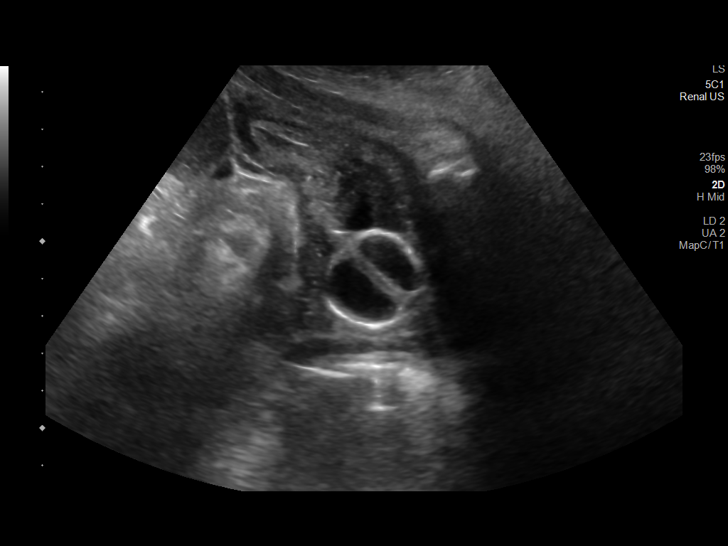

[14 of 25 positions shown; findings below may reference images not displayed]

FINDINGS: Right Kidney:

Renal measurements: 13.6 cm x 6.0 cm x 5.8 cm = volume: 244.51 mL.
Echogenicity within normal limits. No mass is visualized. Mild to
moderate severity right-sided hydronephrosis is seen. A trace amount
of perinephric fluid is identified.

Left Kidney:

Renal measurements: 15.4 cm x 6.9 cm x 4.8 cm = volume: 263.72 mL.
Echogenicity within normal limits. No mass is visualized. Mild
left-sided hydronephrosis is seen. A trace amount of perinephric
fluid is identified.

Bladder:

A Foley catheter is present within the urinary bladder

Other:

None.
IMPRESSION: 1. Mild to moderate severity bilateral hydronephrosis, right greater
than left.
2. A trace amount of bilateral perinephric fluid.

## 2020-06-17 MED ORDER — HYDROMORPHONE HCL 1 MG/ML IJ SOLN: 1.0000 mg | Freq: Every day | INTRAMUSCULAR | Status: DC

## 2020-06-17 MED ORDER — HYDROMORPHONE HCL 1 MG/ML IJ SOLN: 1.0000 mg | Freq: Once | INTRAMUSCULAR | Status: DC

## 2020-06-17 NOTE — Progress Notes (Signed)
Nephrology Follow-Up Consult note   Assessment/Recommendations: Steven Cortez is a/an 45 y.o. 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     Non-Oliguric AKI: ATN requiring dialysis early in the hospitalization now no longer requiring dialysis.  Creatinine improved to 1.5 but steadily rising creatinine for several days.  Also now with significant proteinuria 13 g.  Also with bilateral hydronephrosis on renal ultrasound.  Possible intermittent urinary retention related to infectious issues of the spine.  Concern for recent rising creatinine possibly related to hydronephrosis but periinfectious GN also possible.  Note normal complement. ANA and ANCA negative  --------------- - Continue Foley catheter - repeat NS at 100/hr x 24 hours  - May consider biopsy if the patient's creatinine fails to improve, concern for infection related GN though complement normal.  Note however that setting of endocarditis and IVDU is not ideal   - No RAAS blockade in setting of AKI   Hydronephrosis, bilateral - minimally worsened on [place] 2/21 with bladder collapsed with foley in place.  consulted urology as hydro minimally worsened on repeat US with worsening renal function - appreciate urology -  They are considering possible cystocopy and possible ureteral stents if cr continues to rise - continue foley   Hypertension: improved on increased coreg  Normocytic Anemia: setting of significant illness and iron deficiency.  Start oral iron.  aranesp 40 mcg once on 2/22   Tricuspid MRSA endocarditis: Management per primary team and infectious disease  Hyperphosphatemia - changed to renal diet and on tums with meals for now  Myoclonic jerks - med list reviewed - no neurontin, lyrica, or cefepime.  May be secondary to renal failure.  Appreciate primary team looking into as well     ____________________________________________________   Interval History/Subjective:  Had 3 liters  UOP over 2/22.  States he's sitting up a little better today.    Review of systems:  Denies shortness of breath or chest pain  Denies n/v   Medications:  Current Facility-Administered Medications  Medication Dose Route Frequency Provider Last Rate Last Admin  . 0.9 %  sodium chloride infusion   Intravenous PRN Jadene Pierini, MD   Stopped at 06/02/20 [phone]  . 0.9 %  sodium chloride infusion   Intravenous Continuous Estanislado Emms, MD 75 mL/hr at 06/17/20 0804 New Bag at 06/17/20 0804  . acetaminophen (TYLENOL) tablet 650 mg  650 mg Oral Q4H PRN Steffanie Rainwater, MD   650 mg at 06/17/20 0404  . albuterol (VENTOLIN HFA) 108 (90 Base) MCG/ACT inhaler 1 puff  1 puff Inhalation Q6H PRN Elige Radon, MD   2 puff at 06/05/20 1559  . alteplase (CATHFLO ACTIVASE) injection 2 mg  2 mg Intracatheter Once Reymundo Poll, MD      . amLODipine (NORVASC) tablet 10 mg  10 mg Oral Daily Ephriam Knuckles, Rylee, MD   10 mg at 06/17/20 0805  . calcium carbonate (TUMS - dosed in mg elemental calcium) chewable tablet 200 mg of elemental calcium  1 tablet Oral TID WC Estanislado Emms, MD   200 mg of elemental calcium at 06/17/20 0641  . carvedilol (COREG) tablet 12.5 mg  12.5 mg Oral BID WC Estanislado Emms, MD   12.5 mg at 06/17/20 1761  . Chlorhexidine Gluconate Cloth 2 % PADS 6 each  6 each Topical Daily Reymundo Poll, MD   6 each at 06/16/20  7169  . DAPTOmycin (CUBICIN) 670 mg in sodium chloride 0.9 % IVPB  670 mg Intravenous Q48H Pham, Minh Q, RPH-CPP 226.8 mL/hr at 06/15/20 2119 670 mg at 06/15/20 2119  . diphenhydrAMINE (BENADRYL) injection 12.5 mg  12.5 mg Intravenous Q6H PRN Christian, Rylee, MD       Or  . diphenhydrAMINE (BENADRYL) 12.5 MG/5ML elixir 12.5 mg  12.5 mg Oral Q6H PRN Christian, Rylee, MD      . enoxaparin (LOVENOX) injection 30 mg  30 mg Subcutaneous Daily Reymundo Poll, MD    30 mg at 06/17/20 0806  . feeding supplement (ENSURE ENLIVE / ENSURE PLUS) liquid 237 mL  237 mL Oral BID BM Reymundo Poll, MD   237 mL at 06/17/20 0912  . ferrous sulfate tablet 325 mg  325 mg Oral Q breakfast Estanislado Emms, MD   325 mg at 06/17/20 6789  . HYDROmorphone (DILAUDID) 1 mg/mL PCA injection   Intravenous Q4H Christian, Rylee, MD      . methocarbamol (ROBAXIN) tablet 750 mg  750 mg Oral TID AC & HS Costella, Darci Current, PA-C   750 mg at 06/17/20 3810  . multivitamin with minerals tablet 1 tablet  1 tablet Oral Daily Reymundo Poll, MD   1 tablet at 06/17/20 0805  . naloxone [facility]) injection 0.4 mg  0.4 mg Intravenous PRN Christian, Rylee, MD       And  . sodium chloride flush (NS) 0.9 % injection 9 mL  9 mL Intravenous PRN Christian, Rylee, MD      . ondansetron (ZOFRAN) injection 4 mg  4 mg Intravenous Q6H PRN Christian, Rylee, MD      . polyethylene glycol (MIRALAX / GLYCOLAX) packet 17 g  17 g Oral BID Steffanie Rainwater, MD   17 g at 06/17/20 0806  . senna-docusate (Senokot-S) tablet 2 tablet  2 tablet Oral BID Elige Radon, MD   2 tablet at 06/17/20 0805  . sodium chloride flush (NS) 0.9 % injection 10-40 mL  10-40 mL Intracatheter Q12H Reymundo Poll, MD   10 mL at 06/17/20 0806  . sodium chloride flush (NS) 0.9 % injection 10-40 mL  10-40 mL Intracatheter PRN Reymundo Poll, MD   10 mL at 06/14/20 0101     Physical Exam: Vitals:   06/17/20 0759 06/17/20 0805  BP:  137/84  Pulse:    Resp: 16   Temp:    SpO2: 94%    Total I/O In: 360 [P.O.:360] Out: -   Intake/Output Summary (Last 24 hours) at 06/17/2020 1043 Last data filed at 06/17/2020 0804 Gross per 24 hour  Intake 1740 ml  Output 2950 ml  Net -1210 ml   Constitutional: Ill-appearing, lying in bed, no distress   ENMT: NCAT; MMM CV: S1S2; no rub 1+ bilateral lower extremity edema Respiratory: clear and unlabored at rest Gastrointestinal: soft, non-tender; nd Neuro awake and  conversant Psych - less agitation GU - foley in place with hematuria   Test Results I personally reviewed new and old clinical labs and radiology tests Lab Results  Component Value Date   NA 140 06/17/2020   K 4.3 06/17/2020   CL 104 06/17/2020   CO2 26 06/17/2020   BUN 65 (H) 06/17/2020   CREATININE 3.51 (H) 06/17/2020   CALCIUM 7.4 (L) 06/17/2020   ALBUMIN 1.7 (L) 06/17/2020   PHOS 5.9 (H) 06/17/2020     Estanislado Emms, MD 06/17/2020 10:51 AM

## 2020-06-17 NOTE — Plan of Care (Signed)
  Problem: Clinical Measurements: Goal: Respiratory complications will improve Outcome: Progressing   Problem: Nutrition: Goal: Adequate nutrition will be maintained Outcome: Progressing   Problem: Coping: Goal: Level of anxiety will decrease Outcome: Progressing   Problem: Pain Managment: Goal: General experience of comfort will improve Outcome: Progressing   

## 2020-06-17 NOTE — Progress Notes (Signed)
Physical Therapy Treatment Patient Details Name: Steven Cortez MRN: [id number] DOB: [birth year] Today's Date: 06/17/2020    History of Present Illness Pt is a 45 year old male with PMHx including Hep C and IVDU who presented with back pain, weakness, dyspnea, pleuritic chest pain, urinary retention, and pitting edema. He was recently admitted to [facility] and found to have tricuspid endocarditis with MRSA bacteremia, and pulmonary septic emboli but left AMA. 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 s/pTEE 05/28/20.   2/11 repeat evacuation / extension of decompression    PT Comments    Pt with continued regression this session; unable to achieve out of bed mobility. Pt requiring min assist to progress to sitting edge of bed. Tolerating a duration of 1 minute before lying back down. PCA encouraged. Worked on supine bridging for core activation. Discussed with RN; will need to continue to work with team for other pain control options to increase pt participation and abilities for therapy.    Follow Up Recommendations  Home health PT;Supervision for mobility/OOB     Equipment Recommendations  Rolling walker with 5" wheels;3in1 (PT);Wheelchair (measurements PT);Wheelchair cushion (measurements PT)    Recommendations for Other Services       Precautions / Restrictions Precautions Precautions: Fall;Back;Other (comment) Precaution Booklet Issued: No Precaution Comments: PCA Restrictions Weight Bearing Restrictions: No    Mobility  Bed Mobility Overal bed mobility: Needs Assistance Bed Mobility: Rolling;Sidelying to Sit;Sit to Sidelying Rolling: Modified independent (Device/Increase time) Sidelying to sit: Min assist     Sit to sidelying: Supervision General bed mobility comments: MinA to rise to sitting position, able to return to supine  without physical assist    Transfers                    Ambulation/Gait                 Stairs             Wheelchair Mobility    Modified Rankin (Stroke Patients Only)       Balance Overall balance assessment: Needs assistance Sitting-balance support: Feet supported;Bilateral upper extremity supported Sitting balance-Leahy Scale: Fair                                      Cognition Arousal/Alertness: Awake/alert Behavior During Therapy: WFL for tasks assessed/performed Overall Cognitive Status: Within Functional Limits for tasks assessed                                        Exercises General Exercises - Lower Extremity Heel Slides: Both;10 reps;Supine Other Exercises Other Exercises: Supine: bridging x 10    General Comments        Pertinent Vitals/Pain Pain Assessment: Faces Faces Pain Scale: Hurts worst Pain Location: R groin/hip Pain Descriptors / Indicators: Guarding;Grimacing;Spasm;Sharp;Discomfort Pain Intervention(s): Limited activity within patient's tolerance;Monitored during session;PCA encouraged    Home Living                      Prior Function            PT Goals (current goals can now be found in the care plan section) Acute Rehab PT Goals  Patient Stated Goal: to get rid of pain Potential to Achieve Goals: Good    Frequency    Min 3X/week      PT Plan Current plan remains appropriate    Co-evaluation              AM-PAC PT "6 Clicks" Mobility   Outcome Measure  Help needed turning from your back to your side while in a flat bed without using bedrails?: None Help needed moving from lying on your back to sitting on the side of a flat bed without using bedrails?: A Little Help needed moving to and from a bed to a chair (including a wheelchair)?: A Little Help needed standing up from a chair using your arms (e.g., wheelchair or bedside chair)?: A Little Help  needed to walk in hospital room?: A Little Help needed climbing 3-5 steps with a railing? : A Lot 6 Click Score: 18    End of Session   Activity Tolerance: Patient limited by pain Patient left: in bed;with call bell/phone within reach Nurse Communication: Mobility status PT Visit Diagnosis: Unsteadiness on feet (R26.81);Pain Pain - Right/Left: Right Pain - part of body: Leg     Time: 8850-2774 PT Time Calculation (min) (ACUTE ONLY): 31 min  Charges:  $Therapeutic Activity: 23-37 mins                     Lillia Pauls, PT, DPT Acute Rehabilitation Services Pager [phone] Office [phone]    Norval Morton 06/17/2020, 4:55 PM

## 2020-06-17 NOTE — Progress Notes (Signed)
PT recommended extra dose of prn pain medication for PT/OT sessions. Paged and notified Kirke Corin, MD.

## 2020-06-17 NOTE — Progress Notes (Signed)
Subjective:   Hospital day: 80  Nephro started him on some fluids yesterday  Overnight event: Patient had a fever of 100.7 around 3 am. Resolved with Tylenol.  This AM, feeling a little bit better although pain is similar. He is okay with the current pain regimen. His spasms aren't nearly as bad. He was able to slightly sit up yesterday and work with physical therapy. Fever resolved.   Objective:  Vital signs in last 24 hours: Vitals:   06/17/20 0759 06/17/20 0800 06/17/20 0805 06/17/20 1151  BP:  125/82 137/84   Pulse:  70    Resp: 16 18  14   Temp:  98.4 F (36.9 C)    TempSrc:  Oral    SpO2: 94% 100%  98%  Weight:      Height:        Filed Weights   06/15/20 0043 06/16/20 0040 06/17/20 0023  Weight: 92.5 kg 90.9 kg 96.2 kg     Intake/Output Summary (Last 24 hours) at 06/17/2020 0752 Last data filed at 06/17/2020 0523 Gross per 24 hour  Intake 1380 ml  Output 2950 ml  Net -1570 ml   Net IO Since Admission: -27,833.65 mL [06/17/20 0752]  No results for input(s): GLUCAP in the last 72 hours.   Pertinent Labs: CBC Latest Ref Rng & Units 06/17/2020 06/16/2020 06/15/2020  WBC 4.0 - 10.5 K/uL 10.9(H) 9.5 8.3  Hemoglobin 13.0 - 17.0 g/dL 7.3(L) 7.7(L) 7.4(L)  Hematocrit 39.0 - 52.0 % 23.6(L) 25.2(L) 23.7(L)  Platelets 150 - 400 K/uL 206 219 225    CMP Latest Ref Rng & Units 06/17/2020 06/16/2020 06/15/2020  Glucose 70 - 99 mg/dL 06/17/2020) 782(N) 95  BUN 6 - 20 mg/dL 562(Z) 30(Q) 65(H)  Creatinine 0.61 - 1.24 mg/dL 84(O) 9.62(X) 5.28(U)  Sodium 135 - 145 mmol/L 140 140 140  Potassium 3.5 - 5.1 mmol/L 4.3 4.2 4.6  Chloride 98 - 111 mmol/L 104 102 104  CO2 22 - 32 mmol/L 26 25 25   Calcium 8.9 - 10.3 mg/dL 7.4(L) 7.9(L) 7.9(L)  Total Protein 6.5 - 8.1 g/dL - - -  Total Bilirubin 0.3 - 1.2 mg/dL - - -  Alkaline Phos 38 - 126 U/L - - -  AST 15 - 41 U/L - - -  ALT 0 - 44 U/L - - -    Imaging: No results found. Physical Exam General: More awake and comfortable in  bed. NAD CV: RRR. Trace BLE edema. Pulmonary: On 2LNC. Lungs CTAB Abdominal: Soft. NT/ND. Normal BS. MSK: Myoclonic jerks significantly improved. Extremities: Warm. Well-perfused Neuro: A&Ox3. No focal deficits  Assessment/Plan: Steven Cortez is a 45 y.o. 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 Hospital course complicated by acute renal failure requiring HD and large epidural abscess s/p laminectomy x2 (1/29 and 2/11). Now on long course IV abx treatment and pain management.   Principal Problem:   Endocarditis Active Problems:   Urinary retention   AKI (acute kidney injury) (HCC)   Hyperkalemia   Microcytic anemia   MRSA infection   Epidural abscess   Opioid use disorder   Hydronephrosis   Malnutrition of moderate degree   Antibiotic long-term use   Chronic viral hepatitis B without delta agent and without coma (HCC)  MRSA tricuspid endocarditis Complicated by septic emboli and epidural abscess s/p I&D w/ laminectomy (x2).  Patient spiked a fever of 100.7 overnight which resolved after administration  of Tylenol. Afebrile this morning. Mild leukocytosis w/ wbc of 10.7 from 9.5. Will continue to monitor fever curve.  --Continue Daptomycin (2/11-4/8) --Tylenol prn for fevers --Weekly CK monitoring  Pain control Patient demanding and receiving max dose of dilaudid through the PCA. States pain is stable and he has made some progress in terms of being able to sit up longer than before. Back spasms have improved and he had a good session with PT/OT yesterday. Constipation improved.  --Continue PCA @ 0.3mg  w/ 10 min lockout and max dilaudid dose of 1.2 mg/hr --Continue Robaxin --Continue current bowel regimen  Non-Oliguric progressive AKI Nephrotic range proteinuria Microscopic hematuria No significant improvement in renal function but creatinine starting to trending down (3.75>>3.51) with  very mild improvement in GFR after starting on IVF yesterday. Potassium stable at 4.3. ANCA and ANA within normal limits. Renal biopsy likely indicated due to the lack of significant improvement in renal function. IgA nephropathy still high on the differential.  --Nephrology following, appreciate assistance --Avoiding ACEi/ARB due to reduced GFR --Start IVNS @100cc /hr x48hr --Continue foley --Daily RFP --Strict I&O's, daily weights  Hydronephrosis  Patient making appropriate urine output but renal function remains relatively unchanged. A Further evaluation with Cystoscopy is likely indicated now in the setting of no significant improvement a few days after foley placement.  --Urology following, appreciate recs --Continue foley -Stritc I&O's  Normocytic hypoproliferative anemia Anemia of chronic disease Hgb stable at 7.3. No signs of active bleeds.  --Continue oral iron supplementations --Daily CBCs  Myoclonus (2/17). Myoclonic jerks significantly improved this morning after the discontinuation Gabapentin 2 days ago. Will continue to monitor.  Hypertension. BP improved overnight after increasing dose of coreg by nephrology. BP stable this morning at 125/82.  --Continue amlodipine 10 mg daily --Continue coreg 12.5 mg BID  Diet: Renal diet IVF: None VTE: Lovenox CODE: Full Code  Prior to Admission Living Arrangement: Home Anticipated Discharge Location: Home w/ HH Barriers to Discharge: Medical work up Dispo: Anticipated discharge in approximately 2-3 day(s).   Signed: 01-15-1988, MD 06/17/2020, 7:52 AM  Pager: [phone] Internal Medicine Teaching Service After 5pm on weekdays and 1pm on weekends: On Call pager: [phone]

## 2020-06-18 ENCOUNTER — Inpatient Hospital Stay: Payer: Self-pay | Admitting: Physician Assistant

## 2020-06-18 DIAGNOSIS — R509 Fever, unspecified: Secondary | ICD-10-CM

## 2020-06-18 DIAGNOSIS — D638 Anemia in other chronic diseases classified elsewhere: Secondary | ICD-10-CM

## 2020-06-18 LAB — RENAL FUNCTION PANEL
Albumin: 1.6 g/dL — ABNORMAL LOW (ref 3.5–5.0)
Anion gap: 11 (ref 5–15)
BUN: 58 mg/dL — ABNORMAL HIGH (ref 6–20)
CO2: 23 mmol/L (ref 22–32)
Calcium: 7 mg/dL — ABNORMAL LOW (ref 8.9–10.3)
Chloride: 105 mmol/L (ref 98–111)
Creatinine, Ser: 3.18 mg/dL — ABNORMAL HIGH (ref 0.61–1.24)
GFR, Estimated: 24 mL/min — ABNORMAL LOW (ref >60)
Glucose, Bld: 87 mg/dL (ref 70–99)
Phosphorus: 5.5 mg/dL — ABNORMAL HIGH (ref 2.5–4.6)
Potassium: 3.7 mmol/L (ref 3.5–5.1)
Sodium: 139 mmol/L (ref 135–145)

## 2020-06-18 LAB — HEMOGLOBIN AND HEMATOCRIT, BLOOD
HCT: 27.3 % — ABNORMAL LOW (ref 39.0–52.0)
Hemoglobin: 8.5 g/dL — ABNORMAL LOW (ref 13.0–17.0)

## 2020-06-18 LAB — CBC
HCT: 22.2 % — ABNORMAL LOW (ref 39.0–52.0)
Hemoglobin: 6.9 g/dL — CL (ref 13.0–17.0)
MCH: 28 pg (ref 26.0–34.0)
MCHC: 31.1 g/dL (ref 30.0–36.0)
MCV: 90.2 fL (ref 80.0–100.0)
Platelets: 192 10*3/uL (ref 150–400)
RBC: 2.46 MIL/uL — ABNORMAL LOW (ref 4.22–5.81)
RDW: 15.8 % — ABNORMAL HIGH (ref 11.5–15.5)
WBC: 10.1 10*3/uL (ref 4.0–10.5)
nRBC: 0 % (ref 0.0–0.2)

## 2020-06-18 LAB — PROTEIN / CREATININE RATIO, URINE
Creatinine, Urine: 56.86 mg/dL
Protein Creatinine Ratio: 6.3 mg/mg{Cre} — ABNORMAL HIGH (ref 0.00–0.15)
Total Protein, Urine: 358 mg/dL

## 2020-06-18 LAB — PREPARE RBC (CROSSMATCH)

## 2020-06-18 MED ORDER — SODIUM CHLORIDE 0.9 % IV SOLN
670.0000 mg | Freq: Every day | INTRAVENOUS | Status: DC
  Administered 2020-06-18 – 2020-06-24 (×7): 670 mg via INTRAVENOUS
  Filled 2020-06-18 (×10): qty 13.4

## 2020-06-18 MED ORDER — ENOXAPARIN SODIUM 40 MG/0.4ML ~~LOC~~ SOLN
40.0000 mg | Freq: Every day | SUBCUTANEOUS | Status: DC
  Administered 2020-06-19 – 2020-06-22 (×3): 40 mg via SUBCUTANEOUS
  Filled 2020-06-18 (×3): qty 0.4

## 2020-06-18 MED ORDER — SODIUM CHLORIDE 0.9% IV SOLUTION: Freq: Once | INTRAVENOUS | Status: AC

## 2020-06-18 MED ORDER — HYDROMORPHONE HCL 1 MG/ML IJ SOLN
1.0000 mg | Freq: Every day | INTRAMUSCULAR | Status: DC | PRN
  Administered 2020-06-18 – 2020-06-29 (×8): 1 mg via INTRAVENOUS
  Filled 2020-06-18 (×9): qty 1

## 2020-06-18 NOTE — Progress Notes (Signed)
Lab called with a critical hemoglobin 6.9. Paged covering provider via Amnion at 5:38am. Awaiting further instructions. Patient is asymptomatic.

## 2020-06-18 NOTE — Progress Notes (Signed)
PHARMACY NOTE:  ANTIMICROBIAL RENAL DOSAGE ADJUSTMENT  Current antimicrobial regimen includes a mismatch between antimicrobial dosage and estimated renal function.  As per policy approved by the Pharmacy & Therapeutics and Medical Executive Committees, the antimicrobial dosage will be adjusted accordingly.  Current antimicrobial dosage:  Daptomycin 670mg  IV q48  Indication: bacteremia/endocarditis/discitis  Renal Function:  Estimated Creatinine Clearance: 34.2 mL/min (A) (by C-G formula based on SCr of 3.18 mg/dL (H)). []      On intermittent HD, scheduled: []      On CRRT    Antimicrobial dosage has been changed to: Daptomycin 670mg  IV q24  Additional comments: Also ok to adjust Lovenox to 40mg  SQ qday per Dr. , PharmD, BCIDP, AAHIVP, CPP Infectious Disease Pharmacist 06/18/2020 9:03 AM

## 2020-06-18 NOTE — Progress Notes (Signed)
Patient has a low grade temp. Tylenol was given and encouraged patient to use incentive spirometry at least 10 times. He was able to get up to 1000. Will reassess temp in one hour.

## 2020-06-18 NOTE — Progress Notes (Signed)
Nephrology Follow-Up Consult note   Assessment/Recommendations: Steven Cortez is a/an 45 y.o. 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     Non-Oliguric AKI: ATN requiring dialysis early in the hospitalization now no longer requiring dialysis.  Creatinine improved to 1.5 but steadily rising creatinine for several days.  Also now with significant proteinuria 13 g.  Also with bilateral hydronephrosis on renal ultrasound.  Possible intermittent urinary retention related to infectious issues of the spine.  Concern for recent rising creatinine possibly related to hydronephrosis but periinfectious GN also possible.  Note normal complement. ANA and ANCA negative  --------------- - Continue Foley catheter  - repeat NS at 100/hr x 24 hours  - Thankfully improving.  Defer biopsy for now as improving and setting of IVDU and endocarditis.     - No RAAS blockade in setting of AKI   Hydronephrosis, bilateral - minimally worsened on [place] 2/21 with bladder collapsed with foley in place.  consulted urology as hydro minimally worsened on repeat US with worsening renal function - appreciate urology -  They are considering possible cystocopy and possible ureteral stents if cr continues to rise - continue foley   Hypertension: controlled   Normocytic Anemia: setting of significant illness and iron deficiency.  on oral iron.  aranesp 40 mcg once on 2/22.  Getting PRBC's on 2/24  Tricuspid MRSA endocarditis: Management per primary team and infectious disease  Hyperphosphatemia - changed to renal diet and on tums with meals for now  Myoclonic jerks - med list reviewed - no neurontin, lyrica, or cefepime.  May be secondary to renal failure.  Appreciate primary team looking into as well    ____________________________________________________   Interval History/Subjective:   Had 2.9 liters  UOP over 2/22.  States he's done with the drugs.  Getting a unit of blood this AM.   Review of systems:   Denies shortness of breath or chest pain  Denies n/v   Medications:  Current Facility-Administered Medications  Medication Dose Route Frequency Provider Last Rate Last Admin  . 0.9 %  sodium chloride infusion   Intravenous PRN Jadene Pierini, MD   Stopped at 06/02/20 [phone]  . 0.9 %  sodium chloride infusion   Intravenous Continuous Estanislado Emms, MD 100 mL/hr at 06/17/20 2010 New Bag at 06/17/20 2010  . acetaminophen (TYLENOL) tablet 650 mg  650 mg Oral Q4H PRN Steffanie Rainwater, MD   650 mg at 06/18/20 0456  . albuterol (VENTOLIN HFA) 108 (90 Base) MCG/ACT inhaler 1 puff  1 puff Inhalation Q6H PRN Elige Radon, MD   2 puff at 06/05/20 1559  . alteplase (CATHFLO ACTIVASE) injection 2 mg  2 mg Intracatheter Once Reymundo Poll, MD      . amLODipine (NORVASC) tablet 10 mg  10 mg Oral Daily Ephriam Knuckles, Rylee, MD   10 mg at 06/18/20 0835  . calcium carbonate (TUMS - dosed in mg elemental calcium) chewable tablet 200 mg of elemental calcium  1 tablet Oral TID WC Estanislado Emms, MD   200 mg of elemental calcium at 06/18/20 0834  . carvedilol (COREG) tablet 12.5 mg  12.5 mg Oral BID WC Estanislado Emms, MD   12.5 mg at 06/18/20 0835  . Chlorhexidine Gluconate Cloth 2 % PADS 6 each  6 each Topical Daily Reymundo Poll, MD   6 each at 06/18/20 [phone]  . DAPTOmycin (  CUBICIN) 670 mg in sodium chloride 0.9 % IVPB  670 mg Intravenous Q2000 Pham, Minh Q, RPH-CPP      . diphenhydrAMINE (BENADRYL) injection 12.5 mg  12.5 mg Intravenous Q6H PRN Christian, Rylee, MD       Or  . diphenhydrAMINE (BENADRYL) 12.5 MG/5ML elixir 12.5 mg  12.5 mg Oral Q6H PRN Christian, Rylee, MD      . Melene Muller ON 06/19/2020] enoxaparin (LOVENOX) injection 40 mg  40 mg Subcutaneous Daily Pham, Minh Q, RPH-CPP      . feeding supplement (ENSURE ENLIVE / ENSURE PLUS) liquid 237 mL  237 mL Oral BID BM  Reymundo Poll, MD   237 mL at 06/18/20 0840  . ferrous sulfate tablet 325 mg  325 mg Oral Q breakfast Estanislado Emms, MD   325 mg at 06/18/20 6270  . HYDROmorphone (DILAUDID) 1 mg/mL PCA injection   Intravenous Q4H Christian, Rylee, MD   30 mg at 06/18/20 0721  . HYDROmorphone (DILAUDID) injection 1 mg  1 mg Intravenous Daily PRN Steffanie Rainwater, MD      . methocarbamol (ROBAXIN) tablet 750 mg  750 mg Oral TID AC & HS Costella, Vincent J, PA-C   750 mg at 06/18/20 0456  . multivitamin with minerals tablet 1 tablet  1 tablet Oral Daily Reymundo Poll, MD   1 tablet at 06/18/20 0835  . naloxone [facility]) injection 0.4 mg  0.4 mg Intravenous PRN Christian, Rylee, MD       And  . sodium chloride flush (NS) 0.9 % injection 9 mL  9 mL Intravenous PRN Christian, Rylee, MD      . ondansetron (ZOFRAN) injection 4 mg  4 mg Intravenous Q6H PRN Christian, Rylee, MD      . polyethylene glycol (MIRALAX / GLYCOLAX) packet 17 g  17 g Oral BID Steffanie Rainwater, MD   17 g at 06/18/20 0835  . senna-docusate (Senokot-S) tablet 2 tablet  2 tablet Oral BID Elige Radon, MD   2 tablet at 06/18/20 [phone]  . sodium chloride flush (NS) 0.9 % injection 10-40 mL  10-40 mL Intracatheter Q12H Reymundo Poll, MD   10 mL at 06/18/20 9381  . sodium chloride flush (NS) 0.9 % injection 10-40 mL  10-40 mL Intracatheter PRN Reymundo Poll, MD   10 mL at 06/14/20 0101     Physical Exam: Vitals:   06/18/20 1005 06/18/20 1028  BP: 128/79 124/79  Pulse: 70 70  Resp: 10 10  Temp: 98.5 F (36.9 C) 98.4 F (36.9 C)  SpO2: 100% 99%   Total I/O In: 565 [P.O.:240; I.V.:10; Blood:315] Out: -   Intake/Output Summary (Last 24 hours) at 06/18/2020 1115 Last data filed at 06/18/2020 1010 Gross per 24 hour  Intake 4062.29 ml  Output 2850 ml  Net 1212.29 ml   Constitutional: Ill-appearing, lying in bed, no distress    ENMT: NCAT; MMM CV: S1S2; no rub no pitting lower extremity edema Respiratory: clear and  unlabored at rest Gastrointestinal: soft, non-tender; nd Neuro awake and conversant; no jerking movements noted Psych - no anxiety or agitation GU - foley in place with hematuria   Test Results I personally reviewed new and old clinical labs and radiology tests Lab Results  Component Value Date   NA 139 06/18/2020   K 3.7 06/18/2020   CL 105 06/18/2020   CO2 23 06/18/2020   BUN 58 (H) 06/18/2020   CREATININE 3.18 (H) 06/18/2020   CALCIUM 7.0 (L) 06/18/2020   ALBUMIN  1.6 (L) 06/18/2020   PHOS 5.5 (H) 06/18/2020     Estanislado Emms, MD 06/18/2020 11:23 AM

## 2020-06-18 NOTE — Consult Note (Signed)
Reconsulted on patient's worsening renal fxn and bilateral hydro.  He was originally seen May 21, 2020.  He had a history of endocarditis and MRSA bacteremia.  He had a paraphimosis that was reduced after a Foley catheter was placed.  He also underwent a renal ultrasound May 21, 2020 which showed mild to moderate right > left bilateral hydronephrosis with a creatinine of 7.5.  His bladder contained about 750 cc of urine when a Foley was placed and he denies any prior history of weak stream or painful inability to void. He did require dialysis for short time but he had good urine output and his creatinine improved all the way to 1.45 on 06/02/2020 about the time foley was discontinued.   Since foley removed Cr has risen. A renal ultrasound 06/03/2020 showed mild - mod right hydronephrosis and minimal left without a foley and about 400 ml in bladder with Cr 1.8.   A CT scan 06/05/2020 showed moderate right hydronephrosis and mild left hydronephrosis again without the Foley. No renal or ureteral stone. Prostate without BPH. Creatinine at this point was 1.95 up slightly.  Creatinine continued to rise up to 2.89 and a 06/12/2020 renal ultrasound again revealed moderate right and mild left hydro with about 300 cc in the bladder.  No Foley.  Foley catheter replaced around 06/13/2020 at a creatinine of 3.3.  His creatinine today, 06/15/2020 has risen to 3.89 with repeat ultrasound showing bladder decompressed with Foley and moderate right and mild left hydronephrosis about the same as the CT without the foley.  Again he does not perceive an issue emptying his bladder. He did note red urine today in the bag. He is not ambulating he says due to back pain.   06/18/20 -- Repeat RUS today showed mild bilateral hydronephrosis with decompresed bladder. Creatinine has downtrended somewhat to 3.18. Patient has had 2.8 L urine output for past 24 hours.    PE: . Vitals:   06/18/20 1600 06/18/20 1635  BP: 127/79    Pulse: 84   Resp: 13 17  Temp: 98.9 F (37.2 C)   SpO2: 95% 96%   . Marland Kitchen  Intake/Output Summary (Last 24 hours) at 06/18/2020 1821 Last data filed at 06/18/2020 1800 Gross per 24 hour  Intake 2678 ml  Output 2500 ml  Net 178 ml   NAD, watching TV Abd soft, NT 14 Fr foley in place. Normal foreskin.  Light pink in tube, red in bag  A/P: Acute on chronic renal failure with bilateral hydronephrosis- hydronephrosis has improved on most recent US and calyces are not distended. Patient has been making excellent urine output. Will plan to continue foley catheter at this time for maximal decompression   Gross hematuria - developed when foley replaced. This may also be related to decompressing a chronically distended bladder. Will need cystoscopy at some point (inpateint vs outpatient).   Will follow along.

## 2020-06-18 NOTE — Progress Notes (Addendum)
Physical Therapy Treatment Patient Details Name: Steven Cortez MRN: [id number] DOB: October 25, 1975 Today's Date: 06/18/2020    History of Present Illness Pt is a 45 year old male with PMHx including Hep C and IVDU who presented with back pain, weakness, dyspnea, pleuritic chest pain, urinary retention, and pitting edema. He was recently admitted to [facility] and found to have tricuspid endocarditis with MRSA bacteremia, and pulmonary septic emboli but left AMA. 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 s/pTEE 05/28/20.   2/11 repeat evacuation / extension of decompression    PT Comments    Pt continues to be limited by pain. He did receive IV pain meds and use of PCA prior to PT but still reports 10/10 pain in sitting and standing and with severe pain symptoms/behaviors.  He was able to stand with RW and min A but only for ~30 seconds .  Also, focused on LE exercises and light core activation exercises.  Pt continues to be limited by back pain - if pain resolves expects to progress well.  Pt was very appreciative of therapy and attempted to participate as pain allowed.   Follow Up Recommendations  SNF     Equipment Recommendations  Rolling walker with 5" wheels;3in1 (PT);Wheelchair (measurements PT);Wheelchair cushion (measurements PT)    Recommendations for Other Services       Precautions / Restrictions Precautions Precautions: Fall;Back;Other (comment) Precaution Booklet Issued: Yes (comment) (Provided pt with back precaution handout and educated on precautions)    Mobility  Bed Mobility Overal bed mobility: Needs Assistance Bed Mobility: Rolling;Sidelying to Sit;Sit to Sidelying Rolling: Modified independent (Device/Increase time) Sidelying to sit: Min assist     Sit to sidelying: Min assist General bed mobility comments: Increased time  but only needing min A; performed via log roll technique    Transfers Overall transfer level: Needs assistance Equipment used: Rolling walker (2 wheeled) Transfers: Sit to/from Stand Sit to Stand: Min assist         General transfer comment: Min A to rise with heavy use of RW and elevated surface  Ambulation/Gait             General Gait Details: unable   Optometrist    Modified Rankin (Stroke Patients Only)       Balance Overall balance assessment: Needs assistance Sitting-balance support: Feet supported;Bilateral upper extremity supported Sitting balance-Leahy Scale: Fair Sitting balance - Comments: BUE support as position of comfort due to pain. Sat EOB for 2 mins upright and then leaned to L for 2 mins for relief before standing and return to supine.    Standing balance support: Bilateral upper extremity supported Standing balance-Leahy Scale: Poor Standing balance comment: Heavy use of RW and only able to stand ~30 seconds.                             Cognition Arousal/Alertness: Awake/alert Behavior During Therapy: WFL for tasks assessed/performed Overall Cognitive Status: Within Functional Limits for tasks assessed                                 General Comments: Internally distracted by pain.      Exercises General Exercises - Lower Extremity Ankle  Circles/Pumps: AROM;Both;10 reps;Supine Heel Slides: Both;10 reps;Supine;AROM (mod resistance with eccentric phase) Hip ABduction/ADduction: AROM;Both;10 reps;Supine (min A) Other Exercises Other Exercises: Supine hooklying: pelvic tilts x 10 ; then pelvic tilt with unilateral lift LE off bed x 5 each leg (required min guard to stabilize on R)    General Comments        Pertinent Vitals/Pain Pain Assessment: 0-10 Pain Score: 10-Worst pain ever (5/10 rest; 10/10 sit and stand) Pain Location: Back, R groin/hip Pain Descriptors / Indicators:  Guarding;Grimacing;Spasm;Sharp;Discomfort Pain Intervention(s): Limited activity within patient's tolerance;Monitored during session;PCA encouraged;Premedicated before session;Utilized relaxation techniques    Home Living                      Prior Function            PT Goals (current goals can now be found in the care plan section) Acute Rehab PT Goals Patient Stated Goal: to get rid of pain PT Goal Formulation: With patient Time For Goal Achievement: 06/22/20 Potential to Achieve Goals: Fair Progress towards PT goals: Progressing toward goals    Frequency    Min 3X/week      PT Plan Discharge plan needs to be updated    Co-evaluation              AM-PAC PT "6 Clicks" Mobility   Outcome Measure  Help needed turning from your back to your side while in a flat bed without using bedrails?: None Help needed moving from lying on your back to sitting on the side of a flat bed without using bedrails?: A Little Help needed moving to and from a bed to a chair (including a wheelchair)?: A Little Help needed standing up from a chair using your arms (e.g., wheelchair or bedside chair)?: A Little Help needed to walk in hospital room?: A Lot Help needed climbing 3-5 steps with a railing? : Total 6 Click Score: 16    End of Session Equipment Utilized During Treatment: Gait belt Activity Tolerance: Patient limited by pain Patient left: in bed;with call bell/phone within reach;with bed alarm set Nurse Communication: Mobility status PT Visit Diagnosis: Unsteadiness on feet (R26.81);Pain Pain - Right/Left: Right Pain - part of body: Leg     Time: 4315-4008 PT Time Calculation (min) (ACUTE ONLY): 30 min  Charges:  $Therapeutic Exercise: 8-22 mins $Therapeutic Activity: 8-22 mins                     Anise Salvo, PT Acute Rehab Services Pager [phone] [facility] [phone]     Rayetta Humphrey 06/18/2020, 5:47 PM

## 2020-06-18 NOTE — Plan of Care (Signed)
  Problem: Education: Goal: Knowledge of General Education information will improve Description: Including pain rating scale, medication(s)/side effects and non-pharmacologic comfort measures Outcome: Progressing   Problem: Clinical Measurements: Goal: Respiratory complications will improve Outcome: Progressing   Problem: Nutrition: Goal: Adequate nutrition will be maintained Outcome: Progressing   Problem: Coping: Goal: Level of anxiety will decrease Outcome: Progressing   Problem: Elimination: Goal: Will not experience complications related to bowel motility Outcome: Progressing   Problem: Pain Managment: Goal: General experience of comfort will improve Outcome: Progressing   

## 2020-06-18 NOTE — Progress Notes (Signed)
Subjective:   Hospital day: 28  Overnight event: Patient spiked another fever to 100.5. Tylenol was administered.  Hemoglobin dropped to 6.9 this morning.  Currently transfuse 1 unit of blood.  This morning, patient states he is doing better.  States the back spasms of resolve his pain is better controlled on PCA.  He had his TED hose on throughout the night.  States he was able to sit up longer yesterday and had a good session with PT after he was given pain medicine.  He denies any shortness of breath, chest pain, abdominal pain or leg pain.  Objective:  Vital signs in last 24 hours: Vitals:   06/18/20 0721 06/18/20 0800 06/18/20 1005 06/18/20 1028  BP:  128/81 128/79 124/79  Pulse:  73 70 70  Resp: 14 13 10 10   Temp:  98.3 F (36.8 C) 98.5 F (36.9 C) 98.4 F (36.9 C)  TempSrc:  Oral Oral Oral  SpO2: 97% 96% 100% 99%  Weight:      Height:        Filed Weights   06/16/20 0040 06/17/20 0023 06/18/20 0400  Weight: 90.9 kg 96.2 kg 94.2 kg     Intake/Output Summary (Last 24 hours) at 06/18/2020 0636 Last data filed at 06/18/2020 0600 Gross per 24 hour  Intake 3857.29 ml  Output 2850 ml  Net 1007.29 ml   Net IO Since Admission: -26,826.36 mL [06/18/20 0636]  No results for input(s): GLUCAP in the last 72 hours.   Pertinent Labs: CBC Latest Ref Rng & Units 06/18/2020 06/17/2020 06/16/2020  WBC 4.0 - 10.5 K/uL 10.1 10.9(H) 9.5  Hemoglobin 13.0 - 17.0 g/dL 6.9(LL) 7.3(L) 7.7(L)  Hematocrit 39.0 - 52.0 % 22.2(L) 23.6(L) 25.2(L)  Platelets 150 - 400 K/uL 192 206 219    CMP Latest Ref Rng & Units 06/18/2020 06/17/2020 06/16/2020  Glucose 70 - 99 mg/dL 87 06/18/2020) 678(L)  BUN 6 - 20 mg/dL 381(O) 17(P) 10(C)  Creatinine 0.61 - 1.24 mg/dL 58(N) 2.77(O) 2.42(P)  Sodium 135 - 145 mmol/L 139 140 140  Potassium 3.5 - 5.1 mmol/L 3.7 4.3 4.2  Chloride 98 - 111 mmol/L 105 104 102  CO2 22 - 32 mmol/L 23 26 25   Calcium 8.9 - 10.3 mg/dL 7.0(L) 7.4(L) 7.9(L)  Total Protein 6.5 - 8.1  g/dL - - -  Total Bilirubin 0.3 - 1.2 mg/dL - - -  Alkaline Phos 38 - 126 U/L - - -  AST 15 - 41 U/L - - -  ALT 0 - 44 U/L - - -    Imaging: 5.36(R RENAL  Result Date: 06/18/2020 CLINICAL DATA:  Hydronephrosis. EXAM: RENAL / URINARY TRACT ULTRASOUND COMPLETE COMPARISON:  None. FINDINGS: Right Kidney: Renal measurements: 13.6 cm x 6.0 cm x 5.8 cm = volume: 244.51 mL. Echogenicity within normal limits. No mass is visualized. Mild to moderate severity right-sided hydronephrosis is seen. A trace amount of perinephric fluid is identified. Left Kidney: Renal measurements: 15.4 cm x 6.9 cm x 4.8 cm = volume: 263.72 mL. Echogenicity within normal limits. No mass is visualized. Mild left-sided hydronephrosis is seen. A trace amount of perinephric fluid is identified. Bladder: A Foley catheter is present within the urinary bladder Other: None. IMPRESSION: 1. Mild to moderate severity bilateral hydronephrosis, right greater than left. 2. A trace amount of bilateral perinephric fluid. Electronically Signed   By: [place] M.D.   On: 06/18/2020 00:20   Physical Exam General:  Middle-age man lying comfortably in bed NAD CV:  Regular rate and rhythm. Trace LE edema Pulmonary: On 2LNC. Normal effort.  Abdominal: Soft. NT/ND. Normal BS MSK: Myoclonic jerks resolved. Extremities: Warm and well-perfused Neuro:  Alert and oriented x3.  No focal deficits.   Assessment/Plan: Steven Cortez is a 45 y.o. 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 Hospital course complicated by acute renal failure requiring HD and large epidural abscess s/p laminectomy x2 (1/29 and 2/11). Now on long course IV abx treatment and pain management.   Principal Problem:   Endocarditis Active Problems:   Urinary retention   Acute renal failure (HCC)   Hyperkalemia   Microcytic anemia   MRSA infection   Epidural abscess   Opioid use  disorder   Hydronephrosis   Malnutrition of moderate degree   Antibiotic long-term use   Chronic viral hepatitis B without delta agent and without coma (HCC)  MRSA tricuspid endocarditis Complicated by septic emboli and epidural abscess s/p I&D w/ laminectomy (x2).  Patient spiked another fever to 100.5 overnight. Resolved with Tylenol. White count remained stable at 10.1. Plan to touch base with ID to reevaluate. We will continue to watch fever curve.  --Continue daptomycin for now (2/11-4/8), adjust based on creatinine clearance --Tylenol PRN for fevers  Pain control Patient is receiving max doses of Dilaudid on PCA. Pain is much better controlled patient more comfortable with the PCA. States he was able to sit up little bit longer yesterday.  Plan to start on as needed IV Dilaudid but his PT/OT sessions.  --Continue PCA at 0.3 mg 10-minute lockout with max dose of 1.2 mg/h --Start IV Dilaudid 1 mg daily PRN for PT/OT sessions --Continue Robaxin --Continue bowel regimen  Non-Oliguric progressive AKI Nephrotic range proteinuria Microscopic hematuria Kidney function with some improvement overnight. GFR still significantly low at 24 but started to trend down. Patient on IV fluids had a good UOP of 2.8 L overnight. Potassium stable at 3.7. Nephro deferring renal biopsy for now with some interval improvement in renal function after starting IV fluids. --Nephrology following, appreciate recs --Continue IV fluids for another 24 hours --Continue foley --Strict I&O --Daily RFP  Hydronephrosis  Patient continues to make appropriate urine output. Foley bag with blood-tinged urine.  --Reconsulted urology, plan to see today and take to cystoscopy --Continue Foley --Strict I&O's  Normocytic hypoproliferative anemia Anemia of chronic disease Status post 6 units of pRBC transfusions. Hemoglobin dropped to 6.9 overnight. Transfusing 1 unit pRBCs. Patient has had hematuria but no signs of active  GI bleeds.  We will continue to monitor closely. --Follow-up posttransfusion H&H --Follow-up iron studies --Continue iron supplementation for now, can discontinue if iron studies comes back normal --Daily CBCs  Myoclonus (2/17), resolved Myoclonic jerks resolved 2 days after discontinuing Gabapentin. We will continue to monitor.  Hypertension.  BP stable overnight. Systolic BP in the 120s. Will continue to monitor.  --Continue amlodipine 10 mg daily --Continue Coreg 12.5 mg twice daily  Diet: Renal diet IVF: None VTE: Lovenox CODE: Full Code  Prior to Admission Living Arrangement: Home Anticipated Discharge Location: Home w/ HH Barriers to Discharge: Medical work up Dispo: Anticipated discharge in approximately 2-3 day(s).   Signed: Steffanie Rainwater, MD 06/18/2020, 6:36 AM  Pager: [phone] Internal Medicine Teaching Service After 5pm on weekdays and 1pm on weekends: On Call pager: [phone]

## 2020-06-18 NOTE — Progress Notes (Signed)
Nutrition Follow-up  DOCUMENTATION CODES:   Non-severe (moderate) malnutrition in context of chronic illness  INTERVENTION:   -Continue MVI with minerals daily -ContinueEnsure Enlive po BID, each supplement provides 350 kcal and 20 grams of protein -ContinueMagic cup TID with meals, each supplement provides 290 kcal and 9 grams of protein  NUTRITION DIAGNOSIS:   Moderate Malnutrition related to chronic illness (IV drug abuse) as evidenced by energy intake < or equal to 75% for > or equal to 1 month,mild muscle depletion,mild fat depletion.  Ongoing  GOAL:   Patient will meet greater than or equal to 90% of their needs  Progressing   MONITOR:   PO intake,Supplement acceptance,Labs,Weight trends,Skin,I & O's  REASON FOR ASSESSMENT:   LOS    ASSESSMENT:   Steven Cortez is a 45 y.o. male with hx of IV drug use and untreated Hep C who was diagnosed with endocarditis and MRSA bacteremia at [facility], left AMA then presented to [facility] a few days later. Also found to have large epidural abscess, s/p laminectomy and evacuation by neurosurgery on 1/29. Symptoms and leukocytosis are improving with antibiotics.  1/30- first HD treatment 2/1- second HD treatment 2/3- s/p TEE- revealed large tricuspid valve vegetation, moderate to severe TR, and possible tiny PFO 2/4- HD cath removed 2/11- s/pPROCEDURE:Repeat L4-5, L5-S1 MIS laminectomies, evacuation of epiduralphlegmon  Reviewed I/O's: +1 L x 24 hours and -9.9 L since 06/04/20  UOP: 2.9 L x 24 hours  Pt sleeping soundly at time of visit. He did not respond to voice.   Per CVTS notes, plan to hold off on angio VAC debridement until completion of IV antibiotics.   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 If creatinine fails to improve, pt may require kidney biopsy.   Per urology notes, pt with blood tinged urine  catheter output and will require cystoscopy at some point (inpatient vs outpatient).  Pt continues with good appetite. Noted meal completions 75-100%. He is consuming Ensure supplements. He is on a renal diet per nephrology service.  Medications reviewed and include calcium carbonate, ferrous sulfate, miralax, ans senokot.   Labs reviewed.   Diet Order:   Diet Order            Diet renal with fluid restriction Room service appropriate? Yes; Fluid consistency: Thin  Diet effective now                 EDUCATION NEEDS:   Education needs have been addressed  Skin:  Skin Assessment: Skin Integrity Issues: Skin Integrity Issues:: Incisions Incisions: closed back  Last BM:  06/16/20  Height:   Ht Readings from Last 1 Encounters:  06/05/20 5\' 10"  (1.778 m)    Weight:   Wt Readings from Last 1 Encounters:  06/18/20 94.2 kg    Ideal Body Weight:  75.5 kg  BMI:  Body mass index is 29.8 kg/m.  Estimated Nutritional Needs:   Kcal:  2300-2500  Protein:  135-155 grams  Fluid:  > 2 L    06/20/20, RD, LDN, CDCES Registered Dietitian II Certified Diabetes Care and Education Specialist Please refer to [facility] for RD and/or RD on-call/weekend/after hours pager

## 2020-06-18 NOTE — Progress Notes (Signed)
Md Claudette Laws returned page. Putting in an order to transfuse 1 unit of PRBC. Awaiting for type and cross. Will continue to monitor for patient safety. Will administer the blood once it's ready.

## 2020-06-19 ENCOUNTER — Other Ambulatory Visit (HOSPITAL_COMMUNITY): Payer: Self-pay

## 2020-06-19 ENCOUNTER — Other Ambulatory Visit: Payer: Self-pay | Admitting: Urology

## 2020-06-19 DIAGNOSIS — R310 Gross hematuria: Secondary | ICD-10-CM

## 2020-06-19 LAB — RENAL FUNCTION PANEL
Albumin: 1.5 g/dL — ABNORMAL LOW (ref 3.5–5.0)
Anion gap: 9 (ref 5–15)
BUN: 54 mg/dL — ABNORMAL HIGH (ref 6–20)
CO2: 23 mmol/L (ref 22–32)
Calcium: 7 mg/dL — ABNORMAL LOW (ref 8.9–10.3)
Chloride: 108 mmol/L (ref 98–111)
Creatinine, Ser: 2.99 mg/dL — ABNORMAL HIGH (ref 0.61–1.24)
GFR, Estimated: 26 mL/min — ABNORMAL LOW (ref >60)
Glucose, Bld: 103 mg/dL — ABNORMAL HIGH (ref 70–99)
Phosphorus: 5.3 mg/dL — ABNORMAL HIGH (ref 2.5–4.6)
Potassium: 3.4 mmol/L — ABNORMAL LOW (ref 3.5–5.1)
Sodium: 140 mmol/L (ref 135–145)

## 2020-06-19 LAB — CBC
HCT: 23.9 % — ABNORMAL LOW (ref 39.0–52.0)
Hemoglobin: 7.9 g/dL — ABNORMAL LOW (ref 13.0–17.0)
MCH: 28.8 pg (ref 26.0–34.0)
MCHC: 33.1 g/dL (ref 30.0–36.0)
MCV: 87.2 fL (ref 80.0–100.0)
Platelets: 190 10*3/uL (ref 150–400)
RBC: 2.74 MIL/uL — ABNORMAL LOW (ref 4.22–5.81)
RDW: 16.7 % — ABNORMAL HIGH (ref 11.5–15.5)
WBC: 11.5 10*3/uL — ABNORMAL HIGH (ref 4.0–10.5)
nRBC: 0 % (ref 0.0–0.2)

## 2020-06-19 LAB — TYPE AND SCREEN
ABO/RH(D): O POS
Antibody Screen: NEGATIVE
Unit division: 0

## 2020-06-19 LAB — BPAM RBC
Blood Product Expiration Date: 202203232359
ISSUE DATE / TIME: 202202241002
Unit Type and Rh: 5100

## 2020-06-19 MED ORDER — POTASSIUM CHLORIDE 20 MEQ PO PACK
40.0000 meq | PACK | Freq: Once | ORAL | Status: AC
  Administered 2020-06-19: 40 meq via ORAL
  Filled 2020-06-19: qty 2

## 2020-06-19 MED ORDER — CIPROFLOXACIN IN D5W 400 MG/200ML IV SOLN
400.0000 mg | INTRAVENOUS | Status: AC
  Administered 2020-06-21: 400 mg via INTRAVENOUS
  Filled 2020-06-19: qty 200

## 2020-06-19 NOTE — Progress Notes (Signed)
See 2/24 progress note attestation - will plan cystoscopy with bilateral retrograde pyelogram, possible bilateral ureteroscopy, bladder biopsy and/or ureteral stents in OR at [facility] for Sunday 06/21/2020. Orders placed.

## 2020-06-19 NOTE — Progress Notes (Signed)
Received call from CCMD. Patient noted with 6 beat run of vtach while sleeping. Dr.Watson notified via Amion communication.     06/19/20 2328  Vitals  Temp 98.7 F (37.1 C)  Temp Source Oral  BP 135/86  MAP (mmHg) 102  BP Location Left Arm  BP Method Automatic  Patient Position (if appropriate) Lying  Pulse Rate 89  Pulse Rate Source Monitor  ECG Heart Rate 87  Resp 15  Level of Consciousness  Level of Consciousness Alert  MEWS COLOR  MEWS Score Color Green  Oxygen Therapy  SpO2 97 %  O2 Device Nasal Cannula  O2 Flow Rate (L/min) 2 L/min  MEWS Score  MEWS Temp 0  MEWS Systolic 0  MEWS Pulse 0  MEWS RR 0  MEWS LOC 0  MEWS Score 0

## 2020-06-19 NOTE — Progress Notes (Signed)
Nephrology Follow-Up Consult note   Assessment/Recommendations: Steven Cortez is a/an 45 y.o. 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     Non-Oliguric AKI: ATN requiring dialysis early in the hospitalization now no longer requiring dialysis.  Creatinine improved to 1.5 but steadily rising creatinine for several days.  Also now with significant proteinuria 13 g.  Also with bilateral hydronephrosis on renal ultrasound.  Possible intermittent urinary retention related to infectious issues of the spine.  Concern for recent rising creatinine possibly related to hydronephrosis but periinfectious GN also possible.  Note normal complement. ANA and ANCA negative  --------------- - Continue Foley catheter  - reduce NS to 75/hr x 24 hours and reassess - Thankfully improving.  Defer biopsy as improving and setting of IVDU and endocarditis.     - No RAAS blockade in setting of AKI   Hydronephrosis, bilateral - minimally worsened on [place] 2/21 with bladder collapsed with foley in place.  consulted urology as hydro minimally worsened on repeat US with worsening renal function - appreciate urology -  Pt will need a cystoscopy at some point (inpatient vs outpatient)  - continue foley   Hypertension: acceptable control   Normocytic Anemia: setting of significant illness and iron deficiency.  on oral iron.  aranesp 40 mcg once on 2/22.  PRBC's on 2/24. improving  Tricuspid MRSA endocarditis: Management per primary team and infectious disease  Hyperphosphatemia - changed to renal diet and on tums with meals for now, improving   Myoclonic jerks - med list reviewed - no neurontin, lyrica, or cefepime.  May be secondary to renal failure.  Appreciate primary team looking into as well    ____________________________________________________   Interval History/Subjective:    Had 2.5 liters  UOP over 2/24.  Feels ok this morning. They've taped his oxygen as coming off a lot.  Has been on NS but no trouble breathing  Review of systems:    Denies shortness of breath or chest pain  Denies n/v   Medications:  Current Facility-Administered Medications  Medication Dose Route Frequency Provider Last Rate Last Admin  . 0.9 %  sodium chloride infusion   Intravenous PRN Jadene Pierini, MD   Stopped at 06/02/20 [phone]  . 0.9 %  sodium chloride infusion   Intravenous Continuous Estanislado Emms, MD 100 mL/hr at 06/19/20 0524 New Bag at 06/19/20 0524  . acetaminophen (TYLENOL) tablet 650 mg  650 mg Oral Q4H PRN Steffanie Rainwater, MD   650 mg at 06/18/20 0456  . albuterol (VENTOLIN HFA) 108 (90 Base) MCG/ACT inhaler 1 puff  1 puff Inhalation Q6H PRN Elige Radon, MD   2 puff at 06/05/20 1559  . alteplase (CATHFLO ACTIVASE) injection 2 mg  2 mg Intracatheter Once Reymundo Poll, MD      . amLODipine (NORVASC) tablet 10 mg  10 mg Oral Daily Ephriam Knuckles, Rylee, MD   10 mg at 06/19/20 0836  . calcium carbonate (TUMS - dosed in mg elemental calcium) chewable tablet 200 mg of elemental calcium  1 tablet Oral TID WC Estanislado Emms, MD   200 mg of elemental calcium at 06/19/20 0631  . carvedilol (COREG) tablet 12.5 mg  12.5 mg Oral BID WC Estanislado Emms, MD   12.5 mg at 06/19/20 0100  . Chlorhexidine Gluconate Cloth 2 % PADS 6 each  6 each Topical Daily Reymundo Poll, MD  6 each at 06/19/20 [phone]  . DAPTOmycin (CUBICIN) 670 mg in sodium chloride 0.9 % IVPB  670 mg Intravenous Q2000 Pham, Minh Q, RPH-CPP 226.8 mL/hr at 06/18/20 2234 670 mg at 06/18/20 2234  . diphenhydrAMINE (BENADRYL) injection 12.5 mg  12.5 mg Intravenous Q6H PRN Christian, Rylee, MD       Or  . diphenhydrAMINE (BENADRYL) 12.5 MG/5ML elixir 12.5 mg  12.5 mg Oral Q6H PRN Ephriam Knuckles, Rylee, MD   12.5 mg at 06/18/20 2225  . enoxaparin (LOVENOX) injection 40 mg  40 mg Subcutaneous Daily Pham, Minh Q, RPH-CPP    40 mg at 06/19/20 0837  . feeding supplement (ENSURE ENLIVE / ENSURE PLUS) liquid 237 mL  237 mL Oral BID BM Reymundo Poll, MD   237 mL at 06/18/20 0840  . ferrous sulfate tablet 325 mg  325 mg Oral Q breakfast Estanislado Emms, MD   325 mg at 06/19/20 0631  . HYDROmorphone (DILAUDID) 1 mg/mL PCA injection   Intravenous Q4H Christian, Rylee, MD   30 mg at 06/18/20 0721  . HYDROmorphone (DILAUDID) injection 1 mg  1 mg Intravenous Daily PRN Steffanie Rainwater, MD   1 mg at 06/18/20 1709  . methocarbamol (ROBAXIN) tablet 750 mg  750 mg Oral TID AC & HS Costella, Vincent J, PA-C   750 mg at 06/19/20 9604  . multivitamin with minerals tablet 1 tablet  1 tablet Oral Daily Reymundo Poll, MD   1 tablet at 06/19/20 0835  . naloxone [facility]) injection 0.4 mg  0.4 mg Intravenous PRN Christian, Rylee, MD       And  . sodium chloride flush (NS) 0.9 % injection 9 mL  9 mL Intravenous PRN Christian, Rylee, MD      . ondansetron (ZOFRAN) injection 4 mg  4 mg Intravenous Q6H PRN Christian, Rylee, MD      . polyethylene glycol (MIRALAX / GLYCOLAX) packet 17 g  17 g Oral BID Steffanie Rainwater, MD   17 g at 06/18/20 0835  . senna-docusate (Senokot-S) tablet 2 tablet  2 tablet Oral BID Elige Radon, MD   2 tablet at 06/18/20 [phone]  . sodium chloride flush (NS) 0.9 % injection 10-40 mL  10-40 mL Intracatheter Q12H Reymundo Poll, MD   10 mL at 06/19/20 8119  . sodium chloride flush (NS) 0.9 % injection 10-40 mL  10-40 mL Intracatheter PRN Reymundo Poll, MD   10 mL at 06/14/20 0101     Physical Exam: Vitals:   06/19/20 0427 06/19/20 0749  BP:    Pulse:    Resp: 18 18  Temp:    SpO2: 92% 99%   No intake/output data recorded.  Intake/Output Summary (Last 24 hours) at 06/19/2020 1140 Last data filed at 06/19/2020 0600 Gross per 24 hour  Intake 3390.95 ml  Output 2500 ml  Net 890.95 ml   Constitutional: Ill-appearing, lying in bed, no distress   ENMT: NCAT; MMM CV: S1S2; no rub; trace to  1+ lower extremity edema Respiratory: clear and unlabored at rest Gastrointestinal: soft, non-tender; nd Neuro awake and conversant; no jerking movements noted Psych - no anxiety or agitation GU - foley in place with hematuria   Test Results I personally reviewed new and old clinical labs and radiology tests Lab Results  Component Value Date   NA 140 06/19/2020   K 3.4 (L) 06/19/2020   CL 108 06/19/2020   CO2 23 06/19/2020   BUN 54 (H) 06/19/2020   CREATININE 2.99 (H) 06/19/2020  CALCIUM 7.0 (L) 06/19/2020   ALBUMIN 1.5 (L) 06/19/2020   PHOS 5.3 (H) 06/19/2020     Estanislado Emms, MD 06/19/2020 11:49 AM

## 2020-06-19 NOTE — Plan of Care (Signed)
  Problem: Skin Integrity: Goal: Risk for impaired skin integrity will decrease Outcome: Completed/Met

## 2020-06-19 NOTE — Progress Notes (Signed)
Subjective:   Hospital day: 29  Overnight event: No acute events overnight.   Patient states he is doing well. He was able to sit up longer yesterday wto eat and work with PT. He was also able to stand for a few seconds but was in significant pain. He is happy about making some progress and hopes to continue in the right direction.    Objective:  Vital signs in last 24 hours: Vitals:   06/19/20 0749 06/19/20 0800 06/19/20 1145 06/19/20 1522  BP:  (!) 142/82    Pulse:  99    Resp: 18 19 18 16   Temp:  98.7 F (37.1 C)    TempSrc:  Oral    SpO2: 99% 98% 96% 98%  Weight:      Height:        Filed Weights   06/17/20 0023 06/18/20 0400 06/19/20 0249  Weight: 96.2 kg 94.2 kg 95.4 kg     Intake/Output Summary (Last 24 hours) at 06/19/2020 0658 Last data filed at 06/19/2020 0600 Gross per 24 hour  Intake 4999.07 ml  Output 2500 ml  Net 2499.07 ml   Net IO Since Admission: -23,906.32 mL [06/19/20 0658]  No results for input(s): GLUCAP in the last 72 hours.   Pertinent Labs: CBC Latest Ref Rng & Units 06/19/2020 06/18/2020 06/18/2020  WBC 4.0 - 10.5 K/uL 11.5(H) - 10.1  Hemoglobin 13.0 - 17.0 g/dL 7.9(L) 8.5(L) 6.9(LL)  Hematocrit 39.0 - 52.0 % 23.9(L) 27.3(L) 22.2(L)  Platelets 150 - 400 K/uL 190 - 192    CMP Latest Ref Rng & Units 06/19/2020 06/18/2020 06/17/2020  Glucose 70 - 99 mg/dL 06/19/2020) 87 025(E)  BUN 6 - 20 mg/dL 527(P) 82(U) 23(N)  Creatinine 0.61 - 1.24 mg/dL 36(R) 4.43(X) 5.40(G)  Sodium 135 - 145 mmol/L 140 139 140  Potassium 3.5 - 5.1 mmol/L 3.4(L) 3.7 4.3  Chloride 98 - 111 mmol/L 108 105 104  CO2 22 - 32 mmol/L 23 23 26   Calcium 8.9 - 10.3 mg/dL 7.0(L) 7.0(L) 7.4(L)  Total Protein 6.5 - 8.1 g/dL - - -  Total Bilirubin 0.3 - 1.2 mg/dL - - -  Alkaline Phos 38 - 126 U/L - - -  AST 15 - 41 U/L - - -  ALT 0 - 44 U/L - - -    Imaging: No results found. Physical Exam General:  Appears comfortable in bed CV: RRR. No m/r/g. Trace LE edema worse on top of  feet.  Pulmonary: Normal effort. No wheezing or rales Abdominal: Soft. NT/ND. Normal BS.  Extremities: 2+ distal pulses Neuro:  A&O x3. No focal deficits.  Assessment/Plan: Steven Cortez is a 45 y.o. 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 Hospital course complicated by acute renal failure requiring HD and large epidural abscess s/p laminectomy x2 (1/29 and 2/11). Now on long course IV abx treatment and pain management.   Principal Problem:   Endocarditis Active Problems:   Urinary retention   Acute renal failure (HCC)   Hyperkalemia   Microcytic anemia   MRSA infection   Epidural abscess   Opioid use disorder   Hydronephrosis   Malnutrition of moderate degree   Antibiotic long-term use   Chronic viral hepatitis B without delta agent and without coma (HCC)   Fever   Anemia of chronic disease  MRSA tricuspid endocarditis Complicated by septic emboli and epidural abscess s/p I&D w/ laminectomy (x2).  No fevers in the last 24 hrs. Mild leukocytosis with WC of 11.5.  Tele showing intermittent ventricular bigeminy for the past 2-3 days. Patient has not had any chest pain or palpitations. Plan to get repeat ECHO to assess for any interval change in cardiac structure or function. We will continue to monitor. --Continue daptomycin (2/11-4/8) --Continue PRN Tylenol  --Follow up ECHO --Continue tele  Pain control Pain is relatively controlled. Patient able to tolerate PT/OT sessions now with the PRN dilaudid. Mobility is improving. Will continue current pain regimen and reassess daily.  --Continue PCA at 0.3 mg 10-minute lockout with max dose of 1.2 mg/h --Continue PRN IV dilaudid with PT/OT sessions. --Continue Robaxin  Non-Oliguric progressive AKI Nephrotic range proteinuria Microscopic hematuria Kidney function continues to improve. Creatine trending down (3.18>>2.99) and GFR slowing  improving (24>>26). Repeat protein/creatinine ration shows improved proteinuria. He had a UOP of 2.5 L in the last 24 hrs. He has mild hypokalemia which we are repleting. We will continue to watch closely and continue to work on addressing any reversible causes of his kidney dysfunction.  --Nephrology following, appreciate recs --IVNS at 22ml/hr x 24 hrs --KCL 40 mEq x1 dose --Strict I&Os, daily weights --Daily RFP  Bilateral hydronephrosis Gross hematuria Patient still making appropriate urine output. Frank blood in foley bag. Patient will benefit from a cystoscopy to complete urologic work up of his kidney dysfunction --Urology following periphery, recommend watching kidney function for now with plans for cystoscopy at some point (inpatient or outpatient) --Continue foley --Plan to reconsult urology again in the next few days.  Normocytic hypoproliferative anemia Anemia of chronic disease S/p 7u of blood transfusion. Hgb improved to 7.9 after transfusion of 1 unit yesterday. Will continue to watch closely for signs of active bleeds.  --Daily CBC --Repeat iron studies in few days --Continue oral iron supplementation  Hypertension.  Stable but still not at goal. BP with systolic in the 140s-150s overnight. Will consider increasing coreg to 25 mg if BP remains high. --Continue amlodipine 10 mg daily --Continue Coreg 12.5 mg BID for now.   Myoclonus (2/17), resolved  Diet: Renal diet IVF: None VTE: Lovenox CODE: Full Code  Prior to Admission Living Arrangement: Home Anticipated Discharge Location: Home w/ HH Barriers to Discharge: Medical work up Dispo: Anticipated discharge in approximately 2-3 day(s).   Signed: Steffanie Rainwater, MD 06/19/2020, 6:58 AM  Pager: [phone] Internal Medicine Teaching Service After 5pm on weekdays and 1pm on weekends: On Call pager: [phone]

## 2020-06-20 ENCOUNTER — Inpatient Hospital Stay (HOSPITAL_COMMUNITY): Payer: Medicaid Other

## 2020-06-20 DIAGNOSIS — I361 Nonrheumatic tricuspid (valve) insufficiency: Secondary | ICD-10-CM

## 2020-06-20 DIAGNOSIS — I38 Endocarditis, valve unspecified: Secondary | ICD-10-CM

## 2020-06-20 LAB — ECHOCARDIOGRAM COMPLETE
Calc EF: 41.9 %
Height: 70 in
S' Lateral: 3.9 cm
Single Plane A2C EF: 39.8 %
Single Plane A4C EF: 44.4 %
Weight: 3474.45 oz

## 2020-06-20 LAB — RENAL FUNCTION PANEL
Albumin: 1.5 g/dL — ABNORMAL LOW (ref 3.5–5.0)
Anion gap: 10 (ref 5–15)
BUN: 52 mg/dL — ABNORMAL HIGH (ref 6–20)
CO2: 22 mmol/L (ref 22–32)
Calcium: 7.2 mg/dL — ABNORMAL LOW (ref 8.9–10.3)
Chloride: 106 mmol/L (ref 98–111)
Creatinine, Ser: 3.04 mg/dL — ABNORMAL HIGH (ref 0.61–1.24)
GFR, Estimated: 25 mL/min — ABNORMAL LOW (ref >60)
Glucose, Bld: 94 mg/dL (ref 70–99)
Phosphorus: 5.5 mg/dL — ABNORMAL HIGH (ref 2.5–4.6)
Potassium: 3.3 mmol/L — ABNORMAL LOW (ref 3.5–5.1)
Sodium: 138 mmol/L (ref 135–145)

## 2020-06-20 LAB — CBC
HCT: 24 % — ABNORMAL LOW (ref 39.0–52.0)
Hemoglobin: 7.9 g/dL — ABNORMAL LOW (ref 13.0–17.0)
MCH: 28.5 pg (ref 26.0–34.0)
MCHC: 32.9 g/dL (ref 30.0–36.0)
MCV: 86.6 fL (ref 80.0–100.0)
Platelets: 197 10*3/uL (ref 150–400)
RBC: 2.77 MIL/uL — ABNORMAL LOW (ref 4.22–5.81)
RDW: 16.2 % — ABNORMAL HIGH (ref 11.5–15.5)
WBC: 11.2 10*3/uL — ABNORMAL HIGH (ref 4.0–10.5)
nRBC: 0 % (ref 0.0–0.2)

## 2020-06-20 MED ORDER — POTASSIUM CHLORIDE 20 MEQ PO PACK
40.0000 meq | PACK | Freq: Two times a day (BID) | ORAL | Status: DC
  Administered 2020-06-20: 40 meq via ORAL
  Filled 2020-06-20: qty 2

## 2020-06-20 MED ORDER — CARVEDILOL 25 MG PO TABS
25.0000 mg | ORAL_TABLET | Freq: Two times a day (BID) | ORAL | Status: DC
  Administered 2020-06-20 – 2020-06-26 (×13): 25 mg via ORAL
  Filled 2020-06-20 (×13): qty 1

## 2020-06-20 NOTE — Progress Notes (Signed)
Subjective:   Hospital day: 30  Overnight event: Patient was reported to have 6 beats of V. tach around 11 PM while sleeping.  Patient was assessed laying comfortably in bed. States she was unable to sleep well due to alarms going off in his room.  He denies any chest pain, shortness of breath or palpitations. States his pain was okay overnight.  Objective:  Vital signs in last 24 hours: Vitals:   06/20/20 0343 06/20/20 0733 06/20/20 0734 06/20/20 0923  BP: (!) 150/86   (!) 145/84  Pulse: 98   86  Resp: 19  17   Temp: 99.6 F (37.6 C) 98.8 F (37.1 C)    TempSrc: Oral Oral    SpO2: 94%  96%   Weight:      Height:        Filed Weights   06/18/20 0400 06/19/20 0249 06/20/20 0109  Weight: 94.2 kg 95.4 kg 98.5 kg     Intake/Output Summary (Last 24 hours) at 06/20/2020 0653 Last data filed at 06/20/2020 0420 Gross per 24 hour  Intake 2299.8 ml  Output 3225 ml  Net -925.2 ml   Net IO Since Admission: -24,831.52 mL [06/20/20 0653]  No results for input(s): GLUCAP in the last 72 hours.   Pertinent Labs: CBC Latest Ref Rng & Units 06/20/2020 06/19/2020 06/18/2020  WBC 4.0 - 10.5 K/uL 11.2(H) 11.5(H) -  Hemoglobin 13.0 - 17.0 g/dL 7.9(L) 7.9(L) 8.5(L)  Hematocrit 39.0 - 52.0 % 24.0(L) 23.9(L) 27.3(L)  Platelets 150 - 400 K/uL 197 190 -    CMP Latest Ref Rng & Units 06/20/2020 06/19/2020 06/18/2020  Glucose 70 - 99 mg/dL 94 937(J) 87  BUN 6 - 20 mg/dL 69(C) 78(L) 38(B)  Creatinine 0.61 - 1.24 mg/dL 0.17(P) 1.02(H) 8.52(D)  Sodium 135 - 145 mmol/L 138 140 139  Potassium 3.5 - 5.1 mmol/L 3.3(L) 3.4(L) 3.7  Chloride 98 - 111 mmol/L 106 108 105  CO2 22 - 32 mmol/L 22 23 23   Calcium 8.9 - 10.3 mg/dL 7.2(L) 7.0(L) 7.0(L)  Total Protein 6.5 - 8.1 g/dL - - -  Total Bilirubin 0.3 - 1.2 mg/dL - - -  Alkaline Phos 38 - 126 U/L - - -  AST 15 - 41 U/L - - -  ALT 0 - 44 U/L - - -    Imaging: No results found. Physical Exam General:  Sleepy but comfortable in bed. CV: Regular  rate and rhythm.  1+ BLE pitting edema, worse on feet Pulmonary:  Lungs CTAB.  Normal effort.  No wheezing or rales. Abdominal:  Soft.  NT/ND.  Normal bowel sounds Extremities: Warm. Well perfused Neuro:  Alert and oriented x3.  No focal deficits.  Assessment/Plan: Steven Cortez is a 45 y.o. 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 Hospital course complicated by acute renal failure requiring HD and large epidural abscess s/p laminectomy x2 (1/29 and 2/11). Now on long course IV abx treatment and pain management.   Principal Problem:   Endocarditis Active Problems:   Urinary retention   Acute renal failure (HCC)   Hyperkalemia   Microcytic anemia   MRSA infection   Epidural abscess   Opioid use disorder   Hydronephrosis   Malnutrition of moderate degree   Antibiotic long-term use   Chronic viral hepatitis B without delta agent and without coma (HCC)   Fever   Anemia of chronic disease  MRSA tricuspid  endocarditis Complicated by septic emboli and epidural abscess s/p I&D w/ laminectomy (x2).  Patient has not spiked a fever in the past 3 days. Mild leukocytosis stable with a white count 11.2. Telemetry showed 6 runs of V. tach overnight to multiple ventricular bigeminy. Patient was in sinus rhythm during encounter this morning.  He denied any chest pain, palpitations or shortness of breath. Repeat echo shows stable EF 60-65% and moderate TR but interval improvement in size of tricuspid valve vegetation (3x1 cm >>2x1 cm). Dilated IVC indicating patient might be in a fluid overloaded state. Patient does have BLE edema but this is more likely 2/2 to third spacing in the setting of his hypoalbuminemia. Will continue monitor closely. --Continue daptomycin (2/11 2/8), followed fever curve and adjust dose based on creatinine clearance --Increase Coreg to 25 mg twice daily --PRN Tylenol for  fevers --Continue telemetry --Continue TED hose  Pain control Pain is relatively well controlled. Patient getting max dose of Dilaudid to the PCA.  Will work on transitioning off PCA as mobility improves. --Continue PCA at 0.3 mg, 10-minute lockout and max dose of 1.2 mg/h --Continue as needed IV Dilaudid 1 mg for PT/OT sessions --Continue Robaxin --Continue PCA at 0.3 mg 10-minute lockout with max dose of 1.2 mg/h --Continue PRN IV dilaudid with PT/OT sessions. --Continue Robaxin 750 mg TID  Non-Oliguric progressive AKI Nephrotic range proteinuria Microscopic hematuria No significant change kidney function overnight.  Slight bump in creatinine from 2.99 to 3.04 but GFR relatively unchanged. Patient continues to make appropriate urine output. He has had interval improvement in his proteinuria. Potassium still low at 3.3 after giving 40 mEq yesterday so plan to replete again. --Nephrology following, appreciate assistance with addressing his AKI --Off IV fluids -- KCl 40 mEq BID x1 day --Strict I&O's, daily weights --Daily RFP  Bilateral hydronephrosis Gross hematuria Patient making appropriate urine output with a UOP of 3.2 L in the last 24 hours.  Continues to have blood-tinged urine in Foley bag. Kidney function not significantly improved. Urology planning for cystoscopy with bilateral retrograde pyelogram and possible bilateral ureteroscopy, bladder biopsy and all ureteral stent in the OR tomorrow. --Urology following, appreciate recs --Cystoscopy tomorrow --Continue Foley  Normocytic hypoproliferative anemia Anemia of chronic disease Status post 7 units of blood transfusion. Hemoglobin stable at 7.9. No obvious signs of bleeding.  --Daily CBC --Repeat iron studies in a few days --Continue oral iron supplementation for now  Hypertension.  BP still systolic in the 140s to 150s. Plan to increase Coreg to 25 mg today and monitor closely.  --Increase Coreg to 25 mg twice  daily --Continue amlodipine 10 mg daily --Daily vitals  Myoclonus (2/17), resolved  Diet: Renal diet IVF: None VTE: Lovenox CODE: Full Code  Prior to Admission Living Arrangement: Home Anticipated Discharge Location: Home w/ HH Barriers to Discharge: Medical work up Dispo: Anticipated discharge in approximately 2-3 day(s).   Signed: Steffanie Rainwater, MD 06/20/2020, 6:53 AM  Pager: [phone] Internal Medicine Teaching Service After 5pm on weekdays and 1pm on weekends: On Call pager: [phone]

## 2020-06-20 NOTE — Plan of Care (Signed)
  Problem: Education: Goal: Knowledge of General Education information will improve Description: Including pain rating scale, medication(s)/side effects and non-pharmacologic comfort measures Outcome: Progressing   Problem: Clinical Measurements: Goal: Respiratory complications will improve Outcome: Progressing Goal: Cardiovascular complication will be avoided Outcome: Progressing   Problem: Coping: Goal: Level of anxiety will decrease Outcome: Progressing   Problem: Education: Goal: Knowledge of disease and its progression will improve Outcome: Progressing   Problem: Clinical Measurements: Goal: Complications related to the disease process or treatment will be avoided or minimized Outcome: Progressing   Problem: Fluid Volume: Goal: Fluid volume balance will be maintained or improved Outcome: Progressing

## 2020-06-20 NOTE — Progress Notes (Signed)
  Echocardiogram 2D Echocardiogram has been performed.  Steven Cortez 06/20/2020, 10:44 AM

## 2020-06-20 NOTE — Progress Notes (Signed)
S: No complaints   O: Today's Vitals   06/20/20 0923 06/20/20 1035 06/20/20 1058 06/20/20 1318  BP: (!) 145/84     Pulse: 86     Resp:    20  Temp:   97.9 F (36.6 C)   TempSrc:   Oral   SpO2:    93%  Weight:      Height:      PainSc: 0-No pain 5   3    Body mass index is 31.16 kg/m.  Intake/Output Summary (Last 24 hours) at 06/20/2020 1534 Last data filed at 06/20/2020 1504 Gross per 24 hour  Intake 1510.22 ml  Output 3900 ml  Net -2389.78 ml    Watching TV and eating lunch Urine light red   A[employer] bladder emptying, bilateral hydro, gross hematuria -  Disc with pt again plan for cysto, bilateral RGP, poss URS, bbx and stents. All questions answered. On for 9 AM tomorrow.   I reached out to Dr. Kirke Corin and pt is stable for procedure. Increased BB, echo stable. Appreciate excellent medical care.

## 2020-06-20 NOTE — Progress Notes (Signed)
Nephrology Follow-Up Consult note   Assessment/Recommendations: Steven Cortez is a/an 45 y.o. 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     Non-Oliguric AKI: ATN requiring dialysis early in the hospitalization now no longer requiring dialysis.  Creatinine improved to 1.5 but steadily rising creatinine for several days.  Also now with significant proteinuria 13 g.  Also with bilateral hydronephrosis on renal ultrasound.  Possible intermittent urinary retention related to infectious issues of the spine.  Concern for recent rising creatinine possibly related to hydronephrosis but periinfectious GN also possible.  Note normal complement. ANA and ANCA negative  --------------- - Continue Foley catheter  - pause IV fluids for now - Thankfully improving.  Defer biopsy as improving and setting of IVDU and endocarditis.     - No RAAS blockade in setting of AKI   Hydronephrosis, bilateral - minimally worsened on [place] 2/21 with bladder collapsed with foley in place.  consulted urology as hydro minimally worsened on repeat US with worsening renal function - appreciate urology - they plan for cystoscopy, bladder biopsy, and/or ureteral stents in OR at [facility] on Sunday 2/27 - Continue foley   Hypertension: acceptable control   Normocytic Anemia: setting of significant illness and iron deficiency.  on oral iron.  aranesp 40 mcg once on 2/22.  PRBC's on 2/24. improving  Tricuspid MRSA endocarditis: Management per primary team and infectious disease  Hyperphosphatemia - changed to renal diet and on tums with meals for now, improving   Myoclonic jerks - med list reviewed - no neurontin, lyrica, or cefepime.  May be secondary to renal failure. Improved.   ____________________________________________________   Interval History/Subjective:   Feels ok today - tired.  Had  3.2 liters  UOP over 2/25.  Has been on NS at 75 ml/hr.  States having a procedure tomorrow.   Review of systems:   Denies shortness of breath or chest pain  Denies n/v   Medications:  Current Facility-Administered Medications  Medication Dose Route Frequency Provider Last Rate Last Admin  . 0.9 %  sodium chloride infusion   Intravenous PRN Jadene Pierini, MD   Stopped at 06/02/20 [phone]  . 0.9 %  sodium chloride infusion   Intravenous Continuous Estanislado Emms, MD 75 mL/hr at 06/20/20 0419 New Bag at 06/20/20 0419  . acetaminophen (TYLENOL) tablet 650 mg  650 mg Oral Q4H PRN Steffanie Rainwater, MD   650 mg at 06/18/20 0456  . albuterol (VENTOLIN HFA) 108 (90 Base) MCG/ACT inhaler 1 puff  1 puff Inhalation Q6H PRN Elige Radon, MD   2 puff at 06/05/20 1559  . alteplase (CATHFLO ACTIVASE) injection 2 mg  2 mg Intracatheter Once Reymundo Poll, MD      . amLODipine (NORVASC) tablet 10 mg  10 mg Oral Daily Ephriam Knuckles, Rylee, MD   10 mg at 06/20/20 0936  . calcium carbonate (TUMS - dosed in mg elemental calcium) chewable tablet 200 mg of elemental calcium  1 tablet Oral TID WC Estanislado Emms, MD   200 mg of elemental calcium at 06/20/20 0921  . carvedilol (COREG) tablet 25 mg  25 mg Oral BID WC Steffanie Rainwater, MD      . Chlorhexidine Gluconate Cloth 2 % PADS 6 each  6 each Topical Daily Reymundo Poll, MD   6 each at 06/19/20 [phone]  . [START ON 06/21/2020] ciprofloxacin (CIPRO) IVPB 400  mg  400 mg Intravenous 60 min Pre-Op Jerilee Field, MD      . DAPTOmycin (CUBICIN) 670 mg in sodium chloride 0.9 % IVPB  670 mg Intravenous Q2000 Pham, Minh Q, RPH-CPP 226.8 mL/hr at 06/19/20 2028 670 mg at 06/19/20 2028  . diphenhydrAMINE (BENADRYL) injection 12.5 mg  12.5 mg Intravenous Q6H PRN Christian, Rylee, MD       Or  . diphenhydrAMINE (BENADRYL) 12.5 MG/5ML elixir 12.5 mg  12.5 mg Oral Q6H PRN Ephriam Knuckles, Rylee, MD   12.5 mg at 06/18/20 2225  . enoxaparin (LOVENOX) injection 40 mg  40  mg Subcutaneous Daily Pham, Minh Q, RPH-CPP   40 mg at 06/20/20 0920  . feeding supplement (ENSURE ENLIVE / ENSURE PLUS) liquid 237 mL  237 mL Oral BID BM Reymundo Poll, MD   237 mL at 06/18/20 0840  . ferrous sulfate tablet 325 mg  325 mg Oral Q breakfast Estanislado Emms, MD   325 mg at 06/20/20 9924  . HYDROmorphone (DILAUDID) 1 mg/mL PCA injection   Intravenous Q4H Christian, Rylee, MD   30 mg at 06/18/20 0721  . HYDROmorphone (DILAUDID) injection 1 mg  1 mg Intravenous Daily PRN Steffanie Rainwater, MD   1 mg at 06/18/20 1709  . methocarbamol (ROBAXIN) tablet 750 mg  750 mg Oral TID AC & HS Costella, Vincent J, PA-C   750 mg at 06/20/20 0932  . multivitamin with minerals tablet 1 tablet  1 tablet Oral Daily Reymundo Poll, MD   1 tablet at 06/20/20 2683  . naloxone [facility]) injection 0.4 mg  0.4 mg Intravenous PRN Christian, Rylee, MD       And  . sodium chloride flush (NS) 0.9 % injection 9 mL  9 mL Intravenous PRN Christian, Rylee, MD      . ondansetron (ZOFRAN) injection 4 mg  4 mg Intravenous Q6H PRN Christian, Rylee, MD      . polyethylene glycol (MIRALAX / GLYCOLAX) packet 17 g  17 g Oral BID Steffanie Rainwater, MD   17 g at 06/20/20 0920  . potassium chloride (KLOR-CON) packet 40 mEq  40 mEq Oral BID Steffanie Rainwater, MD   40 mEq at 06/20/20 4196  . senna-docusate (Senokot-S) tablet 2 tablet  2 tablet Oral BID Elige Radon, MD   2 tablet at 06/20/20 0931  . sodium chloride flush (NS) 0.9 % injection 10-40 mL  10-40 mL Intracatheter Q12H Reymundo Poll, MD   10 mL at 06/20/20 0930  . sodium chloride flush (NS) 0.9 % injection 10-40 mL  10-40 mL Intracatheter PRN Reymundo Poll, MD   10 mL at 06/14/20 0101     Physical Exam: Vitals:   06/20/20 0923 06/20/20 1058  BP: (!) 145/84   Pulse: 86   Resp:    Temp:  97.9 F (36.6 C)  SpO2:     Total I/O In: 10 [I.V.:10] Out: 1100 [Urine:1100]  Intake/Output Summary (Last 24 hours) at 06/20/2020 1151 Last data filed  at 06/20/2020 1000 Gross per 24 hour  Intake 2309.8 ml  Output 4325 ml  Net -2015.2 ml   Constitutional: Ill-appearing, lying in bed, no distress   ENMT: NCAT; MMM CV: S1S2; no rub; 1+ lower extremity edema Respiratory: clear and unlabored at rest Gastrointestinal: soft, non-tender; nd Neuro awake and conversant; no jerking movements noted Psych - no anxiety or agitation GU - foley in place with hematuria   Test Results I personally reviewed new and old clinical labs and radiology  tests Lab Results  Component Value Date   NA 138 06/20/2020   K 3.3 (L) 06/20/2020   CL 106 06/20/2020   CO2 22 06/20/2020   BUN 52 (H) 06/20/2020   CREATININE 3.04 (H) 06/20/2020   CALCIUM 7.2 (L) 06/20/2020   ALBUMIN 1.5 (L) 06/20/2020   PHOS 5.5 (H) 06/20/2020     Estanislado Emms, MD 06/20/2020 12:00 PM

## 2020-06-21 ENCOUNTER — Inpatient Hospital Stay (HOSPITAL_COMMUNITY): Payer: Medicaid Other | Admitting: Anesthesiology

## 2020-06-21 ENCOUNTER — Inpatient Hospital Stay (HOSPITAL_COMMUNITY): Payer: Medicaid Other

## 2020-06-21 ENCOUNTER — Encounter (HOSPITAL_COMMUNITY)
Admission: EM | Disposition: A | Discharge status: Home / Self Care | Payer: Self-pay | Source: Home / Self Care | Attending: Internal Medicine

## 2020-06-21 ENCOUNTER — Encounter (HOSPITAL_COMMUNITY): Payer: Self-pay | Admitting: Internal Medicine

## 2020-06-21 DIAGNOSIS — M7989 Other specified soft tissue disorders: Secondary | ICD-10-CM

## 2020-06-21 DIAGNOSIS — R3129 Other microscopic hematuria: Secondary | ICD-10-CM

## 2020-06-21 HISTORY — PX: CYSTOSCOPY W/ URETERAL STENT PLACEMENT: SHX1429

## 2020-06-21 HISTORY — PX: CYSTOSCOPY WITH BIOPSY: SHX5122

## 2020-06-21 LAB — CBC
HCT: 26.3 % — ABNORMAL LOW (ref 39.0–52.0)
Hemoglobin: 8.7 g/dL — ABNORMAL LOW (ref 13.0–17.0)
MCH: 28.4 pg (ref 26.0–34.0)
MCHC: 33.1 g/dL (ref 30.0–36.0)
MCV: 85.9 fL (ref 80.0–100.0)
Platelets: 214 10*3/uL (ref 150–400)
RBC: 3.06 MIL/uL — ABNORMAL LOW (ref 4.22–5.81)
RDW: 16.2 % — ABNORMAL HIGH (ref 11.5–15.5)
WBC: 11.3 10*3/uL — ABNORMAL HIGH (ref 4.0–10.5)
nRBC: 0 % (ref 0.0–0.2)

## 2020-06-21 LAB — RENAL FUNCTION PANEL
Albumin: 1.6 g/dL — ABNORMAL LOW (ref 3.5–5.0)
Anion gap: 10 (ref 5–15)
BUN: 53 mg/dL — ABNORMAL HIGH (ref 6–20)
CO2: 24 mmol/L (ref 22–32)
Calcium: 7.6 mg/dL — ABNORMAL LOW (ref 8.9–10.3)
Chloride: 104 mmol/L (ref 98–111)
Creatinine, Ser: 3.06 mg/dL — ABNORMAL HIGH (ref 0.61–1.24)
GFR, Estimated: 25 mL/min — ABNORMAL LOW (ref >60)
Glucose, Bld: 100 mg/dL — ABNORMAL HIGH (ref 70–99)
Phosphorus: 6.1 mg/dL — ABNORMAL HIGH (ref 2.5–4.6)
Potassium: 3.3 mmol/L — ABNORMAL LOW (ref 3.5–5.1)
Sodium: 138 mmol/L (ref 135–145)

## 2020-06-21 IMAGING — RF DG CYSTOGRAM 3+V
1 series · 6 of 6 positions shown · IV contrast (agent unspecified)
Comparison: Renal ultrasound-[DATE]; CT abdomen
pelvis-[DATE]

CLINICAL DATA: Cystogram.

EXAM:
CYSTOGRAM - 3+ VIEW
CONTRAST:  Not provided
FLUOROSCOPY TIME:  27 seconds

[Series 1: run · 6 of 6 slices shown]
[im 1/6]
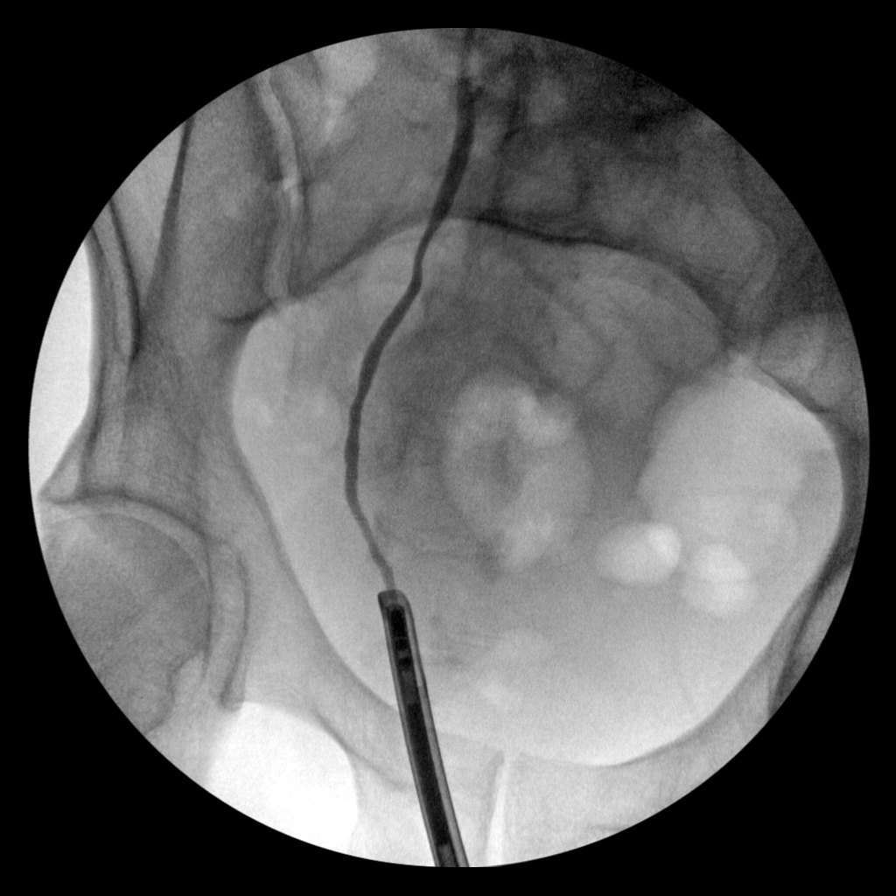
[im 2/6]
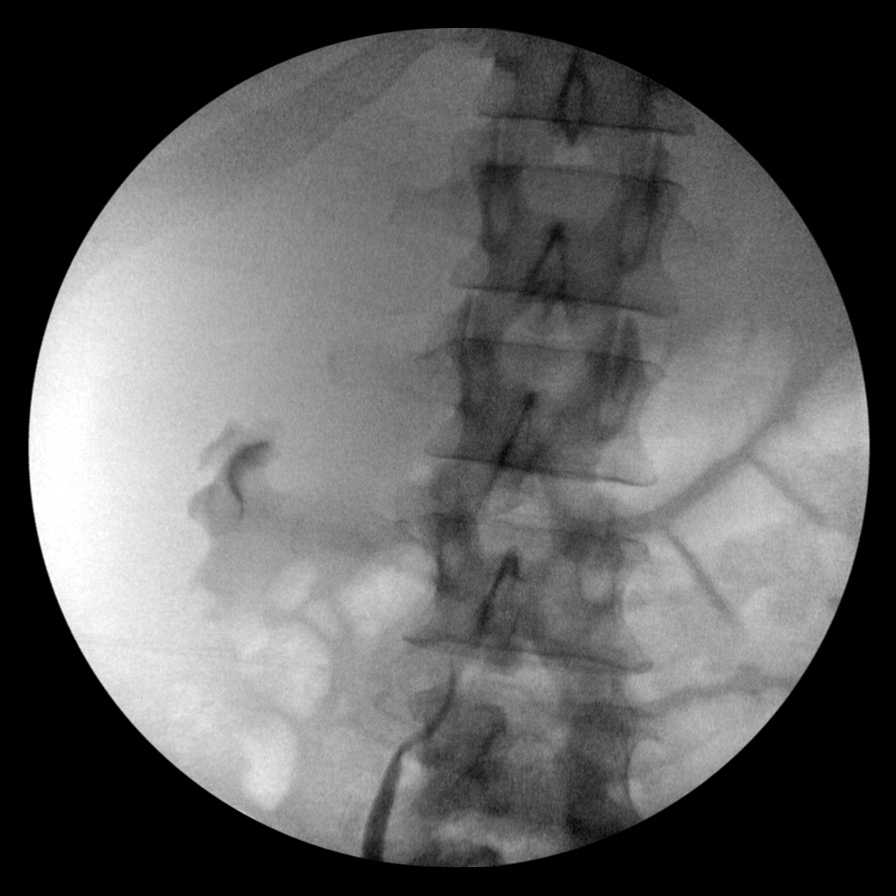
[im 3/6]
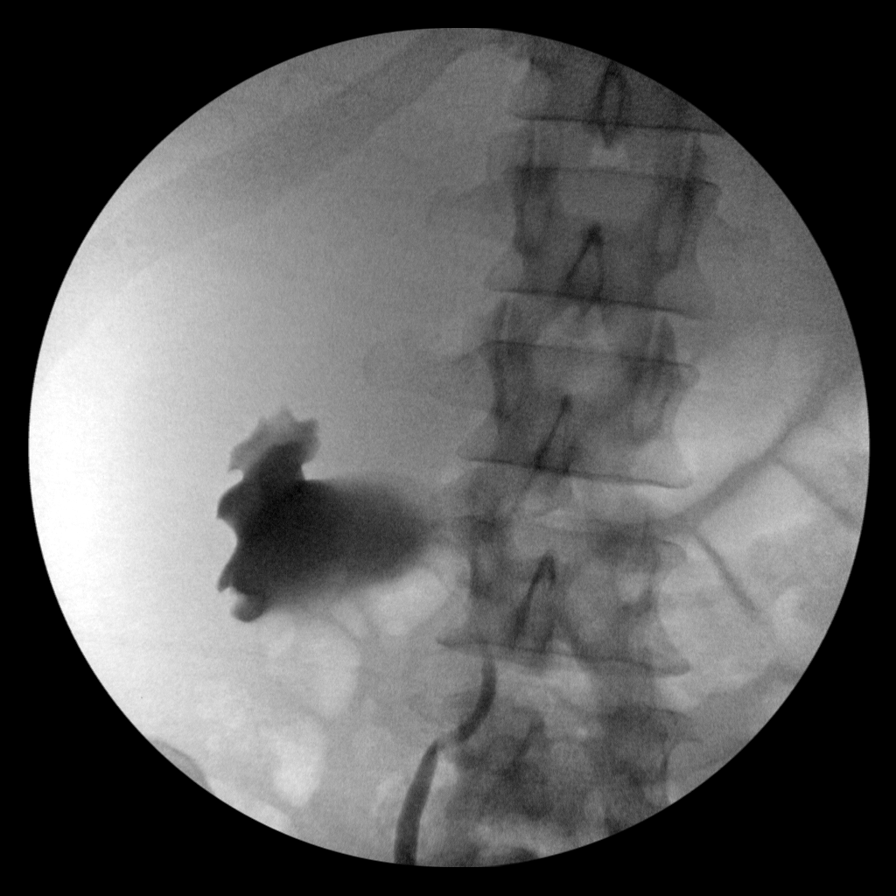
[im 4/6]
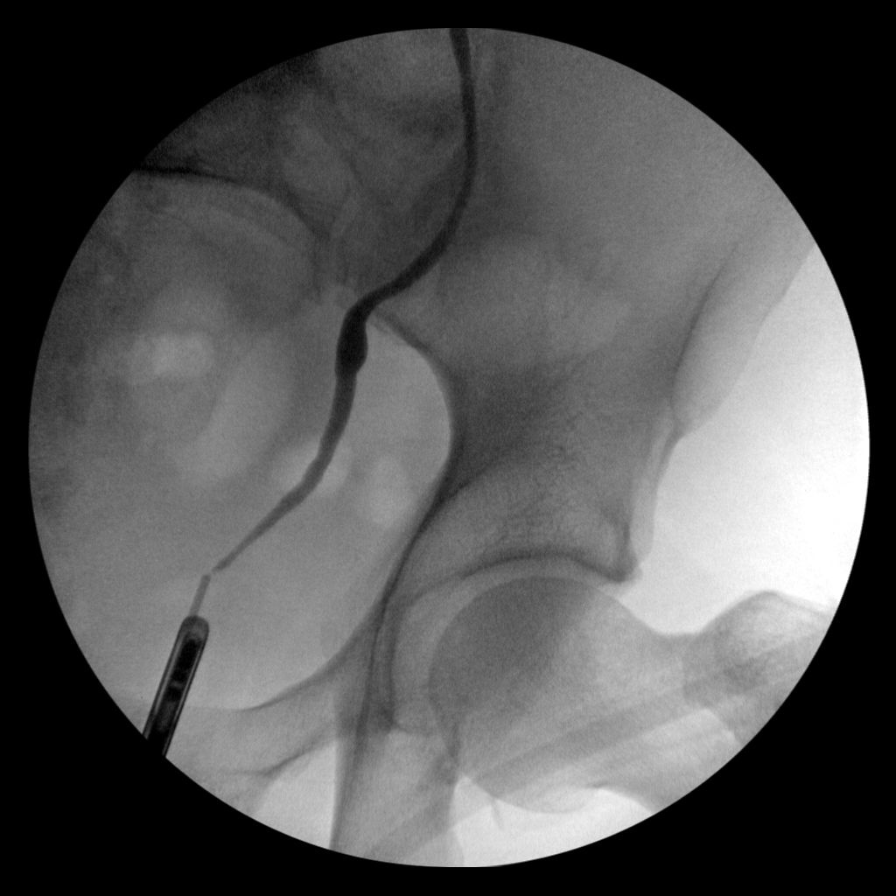
[im 5/6]
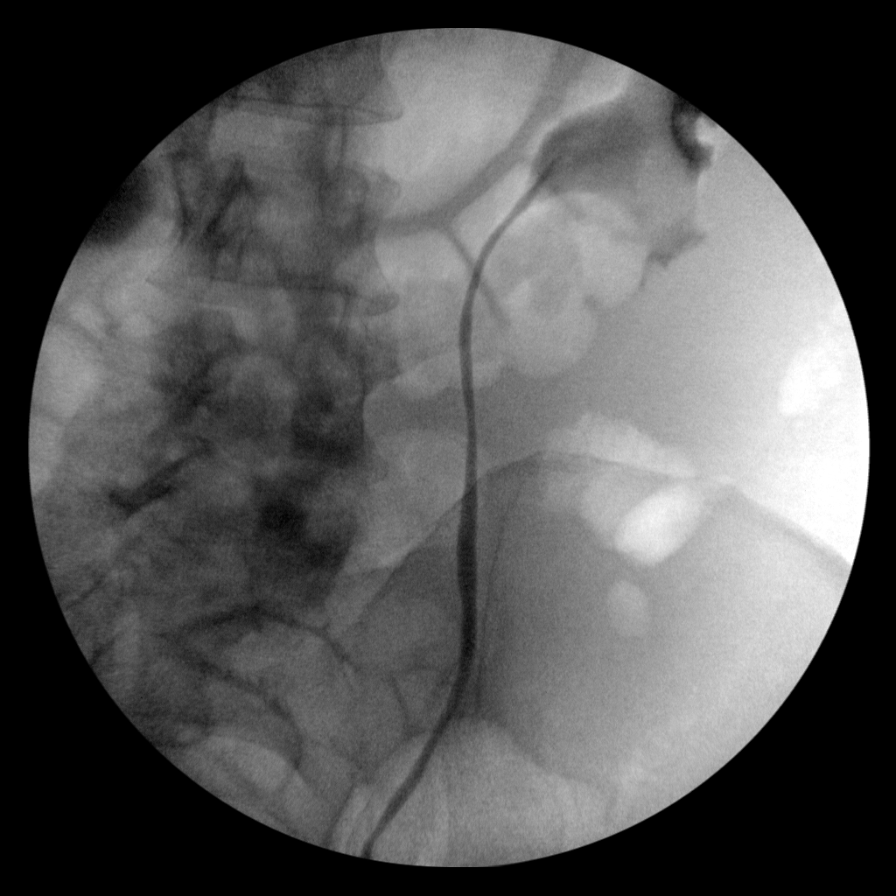
[im 6/6]
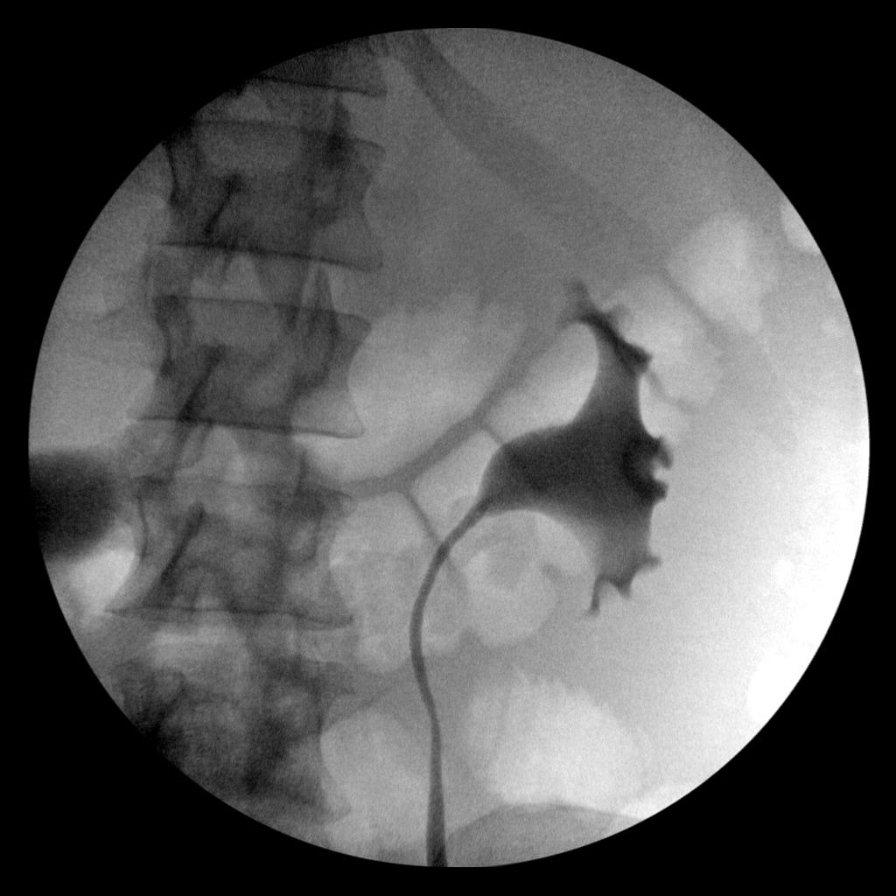

[6 of 6 positions shown; findings below may reference images not displayed]

FINDINGS: Six spot intraoperative fluoroscopic images during bilateral
retrograde pyelogram are provided for review

Initial image demonstrates a cannulation opacification the distal
aspect of the right ureter. Redemonstrated right-sided
pelvicaliectasis with transition site suspected at the level of the
right UPJ

Subsequent images demonstrate selective cannulation opacification of
distal aspect of the left ureter. There is mild dilatation of the
left renal pelvis without caliectasis

No discrete filling defects are seen with the pacified portions of
either ureters or collecting systems.
IMPRESSION: Intraoperative bilateral retrograde pyelogram as above.

## 2020-06-21 SURGERY — CYSTOSCOPY, WITH RETROGRADE PYELOGRAM AND URETERAL STENT INSERTION
Anesthesia: General | Site: Bladder

## 2020-06-21 MED ORDER — IOHEXOL 300 MG/ML  SOLN
INTRAMUSCULAR | Status: DC | PRN
  Administered 2020-06-21: 20 mL via URETHRAL

## 2020-06-21 MED ORDER — ALBUMIN HUMAN 5 % IV SOLN: INTRAVENOUS | Status: DC | PRN

## 2020-06-21 MED ORDER — MIDAZOLAM HCL 5 MG/5ML IJ SOLN
INTRAMUSCULAR | Status: DC | PRN
  Administered 2020-06-21: 2 mg via INTRAVENOUS

## 2020-06-21 MED ORDER — POTASSIUM CHLORIDE 20 MEQ PO PACK
40.0000 meq | PACK | Freq: Once | ORAL | Status: AC
  Administered 2020-06-21: 40 meq via ORAL
  Filled 2020-06-21: qty 2

## 2020-06-21 MED ORDER — PROPOFOL 10 MG/ML IV BOLUS
INTRAVENOUS | Status: DC | PRN
  Administered 2020-06-21: 50 mg via INTRAVENOUS
  Administered 2020-06-21: 150 mg via INTRAVENOUS

## 2020-06-21 MED ORDER — PHENYLEPHRINE HCL (PRESSORS) 10 MG/ML IV SOLN
INTRAVENOUS | Status: DC | PRN
  Administered 2020-06-21: 160 ug via INTRAVENOUS
  Administered 2020-06-21 (×3): 80 ug via INTRAVENOUS

## 2020-06-21 MED ORDER — EPHEDRINE SULFATE 50 MG/ML IJ SOLN
INTRAMUSCULAR | Status: DC | PRN
  Administered 2020-06-21 (×2): 10 mg via INTRAVENOUS
  Administered 2020-06-21: 5 mg via INTRAVENOUS
  Administered 2020-06-21 (×2): 10 mg via INTRAVENOUS

## 2020-06-21 MED ORDER — FENTANYL CITRATE (PF) 100 MCG/2ML IJ SOLN
INTRAMUSCULAR | Status: DC | PRN
  Administered 2020-06-21 (×2): 100 ug via INTRAVENOUS

## 2020-06-21 MED ORDER — 0.9 % SODIUM CHLORIDE (POUR BTL) OPTIME
TOPICAL | Status: DC | PRN
  Administered 2020-06-21: 1000 mL

## 2020-06-21 MED ORDER — FENTANYL CITRATE (PF) 250 MCG/5ML IJ SOLN
INTRAMUSCULAR | Status: AC
  Filled 2020-06-21: qty 5

## 2020-06-21 MED ORDER — LIDOCAINE HCL URETHRAL/MUCOSAL 2 % EX GEL
CUTANEOUS | Status: AC
  Filled 2020-06-21: qty 11

## 2020-06-21 MED ORDER — ORAL CARE MOUTH RINSE: 15.0000 mL | Freq: Once | OROMUCOSAL | Status: AC

## 2020-06-21 MED ORDER — CHLORHEXIDINE GLUCONATE 0.12 % MT SOLN
15.0000 mL | Freq: Once | OROMUCOSAL | Status: AC
  Administered 2020-06-21: 15 mL via OROMUCOSAL
  Filled 2020-06-21: qty 15

## 2020-06-21 MED ORDER — LIDOCAINE 2% (20 MG/ML) 5 ML SYRINGE
INTRAMUSCULAR | Status: DC | PRN
  Administered 2020-06-21: 40 mg via INTRAVENOUS

## 2020-06-21 MED ORDER — KETAMINE HCL 50 MG/5ML IJ SOSY
PREFILLED_SYRINGE | INTRAMUSCULAR | Status: AC
  Filled 2020-06-21: qty 5

## 2020-06-21 MED ORDER — STERILE WATER FOR IRRIGATION IR SOLN
Status: DC | PRN
  Administered 2020-06-21: 3000 mL

## 2020-06-21 MED ORDER — ONDANSETRON HCL 4 MG/2ML IJ SOLN
INTRAMUSCULAR | Status: DC | PRN
  Administered 2020-06-21: 4 mg via INTRAVENOUS

## 2020-06-21 MED ORDER — PROPOFOL 10 MG/ML IV BOLUS
INTRAVENOUS | Status: AC
  Filled 2020-06-21: qty 20

## 2020-06-21 MED ORDER — SODIUM CHLORIDE 0.9 % IV SOLN: INTRAVENOUS | Status: DC

## 2020-06-21 MED ORDER — KETAMINE HCL 10 MG/ML IJ SOLN
INTRAMUSCULAR | Status: DC | PRN
  Administered 2020-06-21: 30 mg via INTRAVENOUS

## 2020-06-21 MED ORDER — MIDAZOLAM HCL 2 MG/2ML IJ SOLN
INTRAMUSCULAR | Status: AC
  Filled 2020-06-21: qty 2

## 2020-06-21 SURGICAL SUPPLY — 38 items
BAG URINE DRAIN 2000ML AR STRL (UROLOGICAL SUPPLIES) ×3 IMPLANT
BAG URINE DRAINAGE (UROLOGICAL SUPPLIES) IMPLANT
BAG URINE LEG 19OZ MD ST LTX (BAG) IMPLANT
BAG URINE LEG 500ML (DRAIN) ×3 IMPLANT
BAG URO CATCHER STRL LF (MISCELLANEOUS) ×3 IMPLANT
CATH FOLEY 2WAY SLVR  5CC 16FR (CATHETERS) ×2
CATH FOLEY 2WAY SLVR 5CC 16FR (CATHETERS) ×4 IMPLANT
CATH INTERMIT  6FR 70CM (CATHETERS) ×3 IMPLANT
CATH URET 5FR 28IN OPEN ENDED (CATHETERS) IMPLANT
COVER WAND RF STERILE (DRAPES) ×3 IMPLANT
ELECT REM PT RETURN 9FT ADLT (ELECTROSURGICAL) ×3
ELECT URO BUGBEE 5F (MISCELLANEOUS) ×3
ELECTRODE REM PT RTRN 9FT ADLT (ELECTROSURGICAL) ×2 IMPLANT
ELECTRODE URO BUGBEE 5F (MISCELLANEOUS) ×2 IMPLANT
GLOVE BIO SURGEON STRL SZ7.5 (GLOVE) ×3 IMPLANT
GOWN STRL REUS W/ TWL LRG LVL3 (GOWN DISPOSABLE) ×2 IMPLANT
GOWN STRL REUS W/ TWL XL LVL3 (GOWN DISPOSABLE) ×2 IMPLANT
GOWN STRL REUS W/TWL LRG LVL3 (GOWN DISPOSABLE) ×1
GOWN STRL REUS W/TWL XL LVL3 (GOWN DISPOSABLE) ×1
GUIDEWIRE ANG ZIPWIRE 038X150 (WIRE) IMPLANT
GUIDEWIRE STR DUAL SENSOR (WIRE) ×3 IMPLANT
KIT TURNOVER CYSTO (KITS) ×3 IMPLANT
KIT TURNOVER KIT B (KITS) ×3 IMPLANT
MANIFOLD NEPTUNE II (INSTRUMENTS) IMPLANT
NEEDLE HYPO 22GX1.5 SAFETY (NEEDLE) IMPLANT
NS IRRIG 1000ML POUR BTL (IV SOLUTION) ×3 IMPLANT
NS IRRIG 500ML POUR BTL (IV SOLUTION) IMPLANT
PACK CYSTO (CUSTOM PROCEDURE TRAY) ×3 IMPLANT
PAD ARMBOARD 7.5X6 YLW CONV (MISCELLANEOUS) ×6 IMPLANT
SET IRRIG Y TYPE TUR BLADDER L (SET/KITS/TRAYS/PACK) ×3 IMPLANT
SOL PREP POV-IOD 4OZ 10% (MISCELLANEOUS) ×3 IMPLANT
STENT URET 6FRX24 CONTOUR (STENTS) IMPLANT
STENT URET 6FRX26 CONTOUR (STENTS) IMPLANT
SYPHON OMNI JUG (MISCELLANEOUS) ×3 IMPLANT
TOWEL GREEN STERILE FF (TOWEL DISPOSABLE) ×3 IMPLANT
TUBE CONNECTING 12X1/4 (SUCTIONS) IMPLANT
UNDERPAD 30X36 HEAVY ABSORB (UNDERPADS AND DIAPERS) ×3 IMPLANT
WATER STERILE IRR 3000ML UROMA (IV SOLUTION) ×3 IMPLANT

## 2020-06-21 NOTE — Transfer of Care (Signed)
Immediate Anesthesia Transfer of Care Note  Patient: Steven Cortez  Procedure(s) Performed: CYSTOSCOPY WITH RETROGRADE URETERAL STENT PLACEMENT POSSIBLE URETEROSCOPY (Bilateral ) Bladder BIOPSY (N/A )  Patient Location: PACU  Anesthesia Type:General  Level of Consciousness: awake, alert  and oriented  Airway & Oxygen Therapy: Patient Spontanous Breathing and Patient connected to face mask oxygen  Post-op Assessment: Report given to RN and Post -op Vital signs reviewed and stable  Post vital signs: Reviewed and stable  Last Vitals:  Vitals Value Taken Time  BP 104/74 06/21/20 1105  Temp    Pulse 71 06/21/20 1107  Resp 20 06/21/20 1107  SpO2 100 % 06/21/20 1107  Vitals shown include unvalidated device data.  Last Pain:  Vitals:   06/21/20 0858  TempSrc:   PainSc: 0-No pain      Patients Stated Pain Goal: 3 (06/21/20 0858)  Complications: No complications documented.

## 2020-06-21 NOTE — Progress Notes (Addendum)
Subjective:   Hospital day: 31  Overnight event: No acute events overnight  Plan for cystoscopy this morning. Patient states the procedure went well. States he has been trying use the bedpan but nothing is coming up. He continues to make progressive with mobility but still in some pain with movement or standing up.   Objective:  Vital signs in last 24 hours: Vitals:   06/21/20 1120 06/21/20 1130 06/21/20 1140 06/21/20 1155  BP: 106/75 117/78 120/77   Pulse: 64 65 64   Resp: 16 16 18    Temp:   98.3 F (36.8 C) (!) 97.5 F (36.4 C)  TempSrc:    Oral  SpO2: 100% 100% 99%   Weight:      Height:        Filed Weights   06/19/20 0249 06/20/20 0109 06/21/20 0019  Weight: 95.4 kg 98.5 kg 97.6 kg     Intake/Output Summary (Last 24 hours) at 06/21/2020 0617 Last data filed at 06/21/2020 0539 Gross per 24 hour  Intake 640 ml  Output 3500 ml  Net -2860 ml   Net IO Since Admission: -27,691.52 mL [06/21/20 0617]  No results for input(s): GLUCAP in the last 72 hours.   Pertinent Labs: CBC Latest Ref Rng & Units 06/21/2020 06/20/2020 06/19/2020  WBC 4.0 - 10.5 K/uL 11.3(H) 11.2(H) 11.5(H)  Hemoglobin 13.0 - 17.0 g/dL 1.6(X8.7(L) 7.9(L) 7.9(L)  Hematocrit 39.0 - 52.0 % 26.3(L) 24.0(L) 23.9(L)  Platelets 150 - 400 K/uL 214 197 190    CMP Latest Ref Rng & Units 06/21/2020 06/20/2020 06/19/2020  Glucose 70 - 99 mg/dL 096(E100(H) 94 454(U103(H)  BUN 6 - 20 mg/dL 98(J53(H) 19(J52(H) 47(W54(H)  Creatinine 0.61 - 1.24 mg/dL 2.95(A3.06(H) 2.13(Y3.04(H) 8.65(H2.99(H)  Sodium 135 - 145 mmol/L 138 138 140  Potassium 3.5 - 5.1 mmol/L 3.3(L) 3.3(L) 3.4(L)  Chloride 98 - 111 mmol/L 104 106 108  CO2 22 - 32 mmol/L 24 22 23   Calcium 8.9 - 10.3 mg/dL 7.6(L) 7.2(L) 7.0(L)  Total Protein 6.5 - 8.1 g/dL - - -  Total Bilirubin 0.3 - 1.2 mg/dL - - -  Alkaline Phos 38 - 126 U/L - - -  AST 15 - 41 U/L - - -  ALT 0 - 44 U/L - - -    Imaging: ECHOCARDIOGRAM COMPLETE  Result Date: 06/20/2020    ECHOCARDIOGRAM REPORT   Patient Name:   Steven CharsDEREK  Kraeger Date of Exam: 06/20/2020 Medical Rec #:  [id number]      Height:       70.0 in Accession #:    [id number]     Weight:       217.2 lb Date of Birth:  1976-03-06      BSA:          2.162 m Patient Age:    44 years       BP:           145/84 mmHg Patient Gender: M              HR:           51 bpm. Exam Location:  Inpatient Procedure: 2D Echo, Cardiac Doppler and Color Doppler Indications:    Endocarditis I38  History:        Patient has prior history of Echocardiogram examinations, most                 recent 05/28/2020. Endocarditis; Risk Factors:Current Smoker.  Sonographer:    Renella CunasJulia Swaim RDCS Referring Phys: [phone]  CAROLYN GUILLOUD IMPRESSIONS  1. Left ventricular ejection fraction, by estimation, is 60 to 65%. The left ventricle has normal function. The left ventricle has no regional wall motion abnormalities. Left ventricular diastolic parameters are indeterminate.  2. Right ventricular systolic function is normal. The right ventricular size is mildly enlarged. There is mildly elevated pulmonary artery systolic pressure. The estimated right ventricular systolic pressure is 38.6 mmHg.  3. The mitral valve is normal in structure. No evidence of mitral valve regurgitation. No evidence of mitral stenosis.  4. The tricuspid valve is abnormal: there is a 2 cm X 1 cm echogenic mass on the septal leaflet. Tricuspid valve regurgitation is moderate, eccentric, and may be underestimated in this study.  5. The aortic valve is grossly normal. Aortic valve regurgitation is not visualized.  6. The inferior vena cava is dilated in size with <50% respiratory variability, suggesting right atrial pressure of 15 mmHg. Comparison(s): A prior study was performed on 05/22/2020. Prior images reviewed side by side. Compared to prior, tricuspid vegetation is still present. FINDINGS  Left Ventricle: Left ventricular ejection fraction, by estimation, is 60 to 65%. The left ventricle has normal function. The left ventricle has no  regional wall motion abnormalities. The left ventricular internal cavity size was normal in size. There is  no left ventricular hypertrophy. Left ventricular diastolic parameters are indeterminate. Right Ventricle: The right ventricular size is mildly enlarged. No increase in right ventricular wall thickness. Right ventricular systolic function is normal. There is mildly elevated pulmonary artery systolic pressure. The tricuspid regurgitant velocity is 2.43 m/s, and with an assumed right atrial pressure of 15 mmHg, the estimated right ventricular systolic pressure is 38.6 mmHg. Left Atrium: Left atrial size was normal in size. Right Atrium: Right atrial size was normal in size. Pericardium: There is no evidence of pericardial effusion. Mitral Valve: The mitral valve is normal in structure. No evidence of mitral valve regurgitation. No evidence of mitral valve stenosis. Tricuspid Valve: The tricuspid valve is abnormal. Tricuspid valve regurgitation is moderate. Aortic Valve: The aortic valve is grossly normal. Aortic valve regurgitation is not visualized. Pulmonic Valve: The pulmonic valve was normal in structure. Pulmonic valve regurgitation is not visualized. No evidence of pulmonic stenosis. Aorta: The aortic root is normal in size and structure. Pulmonary Artery: The pulmonary artery is of normal size. Venous: The inferior vena cava is dilated in size with less than 50% respiratory variability, suggesting right atrial pressure of 15 mmHg. IAS/Shunts: The atrial septum is grossly normal.  LEFT VENTRICLE PLAX 2D LVIDd:         5.00 cm LVIDs:         3.90 cm LV PW:         0.90 cm LV IVS:        0.90 cm LVOT diam:     2.10 cm LV SV:         78 LV SV Index:   36 LVOT Area:     3.46 cm  LV Volumes (MOD) LV vol d, MOD A2C: 196.0 ml LV vol d, MOD A4C: 177.0 ml LV vol s, MOD A2C: 118.0 ml LV vol s, MOD A4C: 98.5 ml LV SV MOD A2C:     78.0 ml LV SV MOD A4C:     177.0 ml LV SV MOD BP:      78.5 ml LEFT ATRIUM              Index       RIGHT ATRIUM  Index LA diam:        4.10 cm 1.90 cm/m  RA Area:     21.80 cm LA Vol (A2C):   42.7 ml 19.75 ml/m RA Volume:   64.00 ml  29.61 ml/m LA Vol (A4C):   58.2 ml 26.92 ml/m LA Biplane Vol: 53.3 ml 24.66 ml/m  AORTIC VALVE LVOT Vmax:   127.00 cm/s LVOT Vmean:  84.567 cm/s LVOT VTI:    0.225 m  AORTA Ao Root diam: 3.00 cm TRICUSPID VALVE TR Peak grad:   23.6 mmHg TR Vmax:        243.00 cm/s  SHUNTS Systemic VTI:  0.23 m Systemic Diam: 2.10 cm Riley Lam MD Electronically signed by Riley Lam MD Signature Date/Time: 06/20/2020/1:25:57 PM    Final    Physical Exam General:  Middle-aged man laying in bed on bedpan.  CV: RRR. 1+ BLE edema, 2+ on the feet. Pulmonary: Lunds CTAB. Norma effort. Abdominal: Soft. NT/ND. Normal bowel sounds Extremities: Warm. Well-perfused.  GU: Foley in place Neuro: A&Ox3. No focal deficits.   Assessment/Plan: Steven Cortez is a 44 y.o. 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 Hospital course complicated by acute renal failure requiring HD and large epidural abscess s/p laminectomy x2 (1/29 and 2/11). Now on long course IV abx treatment and pain management.   Principal Problem:   Endocarditis Active Problems:   Urinary retention   Acute renal failure (HCC)   Hyperkalemia   Microcytic anemia   MRSA infection   Epidural abscess   Opioid use disorder   Hydronephrosis   Malnutrition of moderate degree   Antibiotic long-term use   Chronic viral hepatitis B without delta agent and without coma (HCC)   Fever   Anemia of chronic disease  MRSA tricuspid endocarditis Complicated by septic emboli and epidural abscess s/p I&D w/ laminectomy (x2).  Continues to be afebrile. Repeat ECHO showing persistent but decreased size of tricuspid vegetation.  Patient did not have any runs of V. tach overnight but continues to have ventricular  bigeminy. Currently in normal sinus. Continue to monitor closely. --Continue daptomycin (2/11 2/4), monitor fever curve --Continue Coreg 25 mg twice daily --PRN Tylenol for fevers  Pain control Pain relatively controlled on current regimen. Can transition patient off PCA as mobility improves.  --Continue PCA at 0.3 mg 10-minute lockout with max dose of 1.2 mg/h --Continue PRN IV dilaudid with PT/OT sessions. --Continue Robaxin 750 mg TID  BLE Swelling Patient has had bilateral lower extremity edema that continues to progress. Exam today showed 1+ BLE edema with 2+ edema on the feet. Patient with a net I&O of -3.7 L since admission. Lower extremity edema likely secondary to third spacing in the setting of patient's severe hypoalbuminemia. Recommended continued use of TED hose.  Consider giving albumin plus Lasix if this continues to worsen. --Continue TED hose --Elevate feet  Non-Oliguric progressive AKI Nephrotic range proteinuria, improving Microscopic hematuria Kidney function remains relatively unchanged. Patient still making appropriate urine output. Potassium still low at 3.3 s/p 80 mEq yesterday. Continue to watch kidney function after cystoscopy.  --Nephro following, appreciate assistance --KCl 40 mEq x1 dose --Can consider starting on spironolactone if K+ remains persistently low. --Strict I&O's, daily weights  Bilateral hydronephrosis Gross hematuria Patient had a urine output of 3.5 L over the past 24 hours. Cystoscopy today showed gross hematuria, moderate right and mild left UPJ obstruction and malpositioned Foley.  Prostate was normal in size and the urethra was unremarkable without stricture.  There was some erythema of the posterior superior part of the bladder neck secondary to Foley catheter irritation. Biopsy of the posterior bladder was taken and sent for pathology. --Appreciate urology evaluation --Exchanged foley --Follow-up surgical pathology  Normocytic  hypoproliferative anemia Anemia of chronic disease S/p 7 u of pRBC transfusion. Hemoglobin improved to 8.7.  --Daily CBC  Hypertension.  BP elevated to systolic in the 140s overnight but improved this morning.  Current BP is 120/77.  Continue to monitor and adjust antihypertensives as needed. --Continue Coreg 25 mg twice daily --Continue amlodipine 10 mg daily   Myoclonus (2/17), resolved  Diet: Renal diet IVF: None VTE: Lovenox CODE: Full Code  Prior to Admission Living Arrangement: Home Anticipated Discharge Location: Home w/ HH Barriers to Discharge: Medical work up Dispo: Anticipated discharge in approximately 2-3 day(s).   Signed: Steffanie Rainwater, MD 06/21/2020, 6:17 AM  Pager: [phone] Internal Medicine Teaching Service After 5pm on weekdays and 1pm on weekends: On Call pager: [phone]

## 2020-06-21 NOTE — Progress Notes (Signed)
Nephrology Follow-Up Consult note   Assessment/Recommendations: Steven Cortez is a/an 45 y.o. 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     Non-Oliguric AKI: ATN requiring dialysis early in the hospitalization now no longer requiring dialysis.  Creatinine improved to 1.5 but steadily rising creatinine for several days.  Also now with significant proteinuria 13 g.  Also with bilateral hydronephrosis on renal ultrasound.  Possible intermittent urinary retention related to infectious issues of the spine.  Concern for recent rising creatinine possibly related to hydronephrosis but periinfectious GN also possible.  Note normal complement. ANA and ANCA negative  --------------- - Continue Foley catheter - was exchanged today with urology - paused IV fluids on 2/27 - remain off for now   - Thankfully improving.  Defer biopsy as improving and setting of IVDU and endocarditis.     - No RAAS blockade in setting of AKI   Hydronephrosis, bilateral - minimally worsened on [place] 2/21 with bladder collapsed with foley in place.  consulted urology as hydro minimally worsened on repeat US with worsening renal function - appreciate urology - s/p cystoscopy, bladder biopsy and foley exchange on 2/27. Prior foley may have been malpositioned  Hypertension: acceptable control   Normocytic Anemia: setting of significant illness, gross hematuria, and iron deficiency.  on oral iron.  aranesp 40 mcg once on 2/22.  PRBC's on 2/24. improving  Tricuspid MRSA endocarditis: Management per primary team and infectious disease  Hyperphosphatemia - changed to renal diet and on tums with meals for now; may need a stronger binder  Myoclonic jerks - med list reviewed - no neurontin, lyrica, or cefepime.  May be secondary to renal failure.  Improved.   ____________________________________________________   Interval History/Subjective:   Had cystogram with urology today for gross hematuria and felt to have possible malpositioned foley. Catheter was exchanged.  Had 3.5 liters  UOP over 2/26.    Review of systems:   Denies shortness of breath or chest pain  Denies n/v   Medications:  Current Facility-Administered Medications  Medication Dose Route Frequency Provider Last Rate Last Admin  . 0.9 %  sodium chloride infusion   Intravenous PRN Jadene Pierini, MD   Stopped at 06/02/20 [phone]  . acetaminophen (TYLENOL) tablet 650 mg  650 mg Oral Q4H PRN Steffanie Rainwater, MD   650 mg at 06/18/20 0456  . albuterol (VENTOLIN HFA) 108 (90 Base) MCG/ACT inhaler 1 puff  1 puff Inhalation Q6H PRN Elige Radon, MD   2 puff at 06/05/20 1559  . alteplase (CATHFLO ACTIVASE) injection 2 mg  2 mg Intracatheter Once Reymundo Poll, MD      . amLODipine (NORVASC) tablet 10 mg  10 mg Oral Daily Ephriam Knuckles, Rylee, MD   10 mg at 06/20/20 0936  . calcium carbonate (TUMS - dosed in mg elemental calcium) chewable tablet 200 mg of elemental calcium  1 tablet Oral TID WC Estanislado Emms, MD   200 mg of elemental calcium at 06/20/20 1622  . carvedilol (COREG) tablet 25 mg  25 mg Oral BID WC Steffanie Rainwater, MD   25 mg at 06/21/20 9924  . Chlorhexidine Gluconate Cloth 2 % PADS 6 each  6 each Topical Daily Reymundo Poll, MD   6 each at 06/20/20 1000  . DAPTOmycin (CUBICIN) 670 mg in sodium chloride 0.9 % IVPB  670 mg Intravenous Q2000 Pham, Minh Q,  RPH-CPP 226.8 mL/hr at 06/20/20 2100 670 mg at 06/20/20 2100  . diphenhydrAMINE (BENADRYL) injection 12.5 mg  12.5 mg Intravenous Q6H PRN Christian, Rylee, MD       Or  . diphenhydrAMINE (BENADRYL) 12.5 MG/5ML elixir 12.5 mg  12.5 mg Oral Q6H PRN Ephriam Knuckles, Rylee, MD   12.5 mg at 06/18/20 2225  . enoxaparin (LOVENOX) injection 40 mg  40 mg Subcutaneous Daily Pham, Minh Q, RPH-CPP   40 mg at  06/20/20 0920  . feeding supplement (ENSURE ENLIVE / ENSURE PLUS) liquid 237 mL  237 mL Oral BID BM Reymundo Poll, MD   237 mL at 06/18/20 0840  . ferrous sulfate tablet 325 mg  325 mg Oral Q breakfast Estanislado Emms, MD   325 mg at 06/20/20 7654  . HYDROmorphone (DILAUDID) 1 mg/mL PCA injection   Intravenous Q4H Christian, Rylee, MD   30 mg at 06/18/20 0721  . HYDROmorphone (DILAUDID) injection 1 mg  1 mg Intravenous Daily PRN Steffanie Rainwater, MD   1 mg at 06/18/20 1709  . methocarbamol (ROBAXIN) tablet 750 mg  750 mg Oral TID AC & HS Costella, Vincent J, PA-C   750 mg at 06/20/20 2100  . multivitamin with minerals tablet 1 tablet  1 tablet Oral Daily Reymundo Poll, MD   1 tablet at 06/20/20 6503  . naloxone [facility]) injection 0.4 mg  0.4 mg Intravenous PRN Christian, Rylee, MD       And  . sodium chloride flush (NS) 0.9 % injection 9 mL  9 mL Intravenous PRN Christian, Rylee, MD      . ondansetron (ZOFRAN) injection 4 mg  4 mg Intravenous Q6H PRN Christian, Rylee, MD      . polyethylene glycol (MIRALAX / GLYCOLAX) packet 17 g  17 g Oral BID Steffanie Rainwater, MD   17 g at 06/20/20 0920  . senna-docusate (Senokot-S) tablet 2 tablet  2 tablet Oral BID Elige Radon, MD   2 tablet at 06/20/20 0931  . sodium chloride flush (NS) 0.9 % injection 10-40 mL  10-40 mL Intracatheter Q12H Reymundo Poll, MD   30 mL at 06/20/20 2102  . sodium chloride flush (NS) 0.9 % injection 10-40 mL  10-40 mL Intracatheter PRN Reymundo Poll, MD   10 mL at 06/14/20 0101     Physical Exam: Vitals:   06/21/20 1140 06/21/20 1155  BP: 120/77   Pulse: 64   Resp: 18   Temp: 98.3 F (36.8 C) (!) 97.5 F (36.4 C)  SpO2: 99%    Total I/O In: 450 [IV Piggyback:450] Out: 800 [Urine:800]  Intake/Output Summary (Last 24 hours) at 06/21/2020 1347 Last data filed at 06/21/2020 1043 Gross per 24 hour  Intake 960 ml  Output 3200 ml  Net -2240 ml   Constitutional: Ill-appearing, lying in bed, no  distress ENMT: NCAT; MMM CV: S1S2; no rub; 1+ lower extremity edema Respiratory: clear and unlabored at rest Gastrointestinal: soft, non-tender; nd Neuro awake and conversant; no jerking movements noted Psych - no anxiety or agitation GU - foley in place with hematuria   Test Results I personally reviewed new and old clinical labs and radiology tests Lab Results  Component Value Date   NA 138 06/21/2020   K 3.3 (L) 06/21/2020   CL 104 06/21/2020   CO2 24 06/21/2020   BUN 53 (H) 06/21/2020   CREATININE 3.06 (H) 06/21/2020   CALCIUM 7.6 (L) 06/21/2020   ALBUMIN 1.6 (L) 06/21/2020   PHOS 6.1 (H)  06/21/2020     Estanislado Emms, MD 06/21/2020 1:58 PM

## 2020-06-21 NOTE — Progress Notes (Signed)
Pt ready for procedure. NAEON. Urine light pink. AFVSS. Seen in pre-op area. No questions except he doesn't want to know when foley removed or reinserted (wants to be asleep).   . Vitals:   06/21/20 4098 06/21/20 0858  BP: (!) 147/93   Pulse: 95 76  Resp:    Temp:    SpO2:      Intake/Output Summary (Last 24 hours) at 06/21/2020 0940 Last data filed at 06/21/2020 0900 Gross per 24 hour  Intake 510 ml  Output 4200 ml  Net -3690 ml    A/P - bilateral hydro, inc bladder emptying - acute on CRF -   -plan cysto, RGP, poss URS, bbx or stents.

## 2020-06-21 NOTE — Anesthesia Preprocedure Evaluation (Addendum)
Anesthesia Evaluation  Patient identified by MRN, date of birth, ID band Patient awake    Reviewed: Allergy & Precautions, NPO status , Patient's Chart, lab work & pertinent test results  Airway Mallampati: II  TM Distance: >3 FB Neck ROM: Full    Dental  (+) Edentulous Upper, Poor Dentition, Chipped, Missing, Dental Advisory Given,    Pulmonary Current Smoker and Patient abstained from smoking.,     + decreased breath sounds      Cardiovascular  Rhythm:Regular Rate:Normal     Neuro/Psych negative neurological ROS  negative psych ROS   GI/Hepatic (+) Hepatitis -  Endo/Other    Renal/GU Renal InsufficiencyRenal disease     Musculoskeletal   Abdominal Normal abdominal exam  (+)   Peds  Hematology   Anesthesia Other Findings   Reproductive/Obstetrics                            Anesthesia Physical Anesthesia Plan  ASA: III  Anesthesia Plan: General   Post-op Pain Management:    Induction: Intravenous  PONV Risk Score and Plan: 2 and Ondansetron and Midazolam  Airway Management Planned: LMA  Additional Equipment: None  Intra-op Plan:   Post-operative Plan: Extubation in OR  Informed Consent: I have reviewed the patients History and Physical, chart, labs and discussed the procedure including the risks, benefits and alternatives for the proposed anesthesia with the patient or authorized representative who has indicated his/her understanding and acceptance.     Dental advisory given  Plan Discussed with: CRNA  Anesthesia Plan Comments:        Anesthesia Quick Evaluation

## 2020-06-21 NOTE — Op Note (Signed)
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 - 2.0 cm, Foley catheter exchange  Surgeon: Mena Goes  Anesthesia: General  Indication for procedure: Steven Cortez is a 45 year old male admitted with MRSA endocarditis and epidural abscess who has had mild to moderate right greater than left hydro on imaging, incomplete bladder emptying and acute on chronic renal insufficiency.  Creatinine initially went down to 1.4 with a Foley catheter but increased after Foley was removed.  He has had persistent right greater than left mild to moderate hydro with and without a Foley catheter.    Findings: On exam under anesthesia there was some blood at the meatus and the catheter once taken off the leg strap appeared to be extending too far out of the penis indicating some malposition.  The glans, meatus and penis were normal without lesion.  He may or may not be circumcised but foreskin was reduced to prevent a paraphimosis.  The testicles were palpably normal with a 5 cm left hydrocele.  On digital rectal exam the prostate was 25 g and smooth without hard area or nodule.  On cystoscopy the urethra was unremarkable without stricture, the bulbar urethra was dilated with the balloon has been sitting, the prostatic urethra was short and nonobstructive with a wide open bladder neck, bladder without mucosal lesion apart from some posterior superior trauma likely from Foley catheter.  This was biopsied.  Otherwise trigone and ureteral orifices were in their normal orthotopic position with clear efflux.  Efflux throughout the case was  definitely better with the bladder decompressed indicating he might have a component of decreased compliance and/or reflux.  Ureteral orifices could be seen opening with excellent bilateral efflux especially after retrograde pyelogram with good expelling of the contrast and brisk drainage of the ureters.  Right retrograde pyelogram-this outlined a single ureter single collecting system unit with moderate dilation of the collecting system and renal pelvis consistent with a UPJ obstruction but there immediate washout of the entire ureter and washout of the collecting system over time.  Left retrograde pyelogram-this outlined a single ureter single collecting system unit with mild dilation of the renal pelvis and minimal dilation of the calyces again with brisk washout of the ureter and decrease in the collecting system over time.  Description of procedure: After consent was obtained patient brought to the operating room.  After adequate anesthesia was placed lithotomy position.  I remove the Foley catheter and did an exam under anesthesia.  He was prepped and draped in the usual sterile fashion.  Timeout was performed to confirm the patient and procedure.  Cystoscope was passed per urethra and the bladder carefully inspected.  We only had a 30 degree lens but I got a good view of the bladder neck.  There was some posterior superior erythema consistent with Foley catheter irritation.  6 [place] open-ended catheter was used to inject contrast up the right ureteral orifice after it was cannulated.  Similarly left ureteral orifice was cannulated and retrograde injection of contrast was performed.  [place]out imaging was obtained there was excellent efflux of urine and contrast from both sides.  Given his excellent urine output I did not think ureteral stents would aid in drainage of his kidneys.  The posterior  bladder erythema was then biopsied x2 and sent as posterior bladder biopsy.  These areas were fulgurated  largest about 1 cm with the Bugbee electrode.  Hemostasis was excellent.  The scope was backed out and he did void freely which again with the dilated bladder neck and open prostatic urethra raises the question of some neurogenic component.  A 16 French Foley catheter was placed and the balloon filled to 15 cc and left to gravity drainage.  Because his bladder neck is open it is possible the prior 10 cc balloon slipped out into the urethra.  Quick cystogram confirmed the balloon was in the bladder.  Catheter draining clear.  He was awakened taken recovery room in stable condition.  Complications: None  Blood loss: Minimal  Specimens to pathology: Posterior bladder biopsy  Drains: 16 French Foley catheter  Disposition: Patient stable to PACU

## 2020-06-21 NOTE — Anesthesia Procedure Notes (Signed)
Procedure Name: LMA Insertion Date/Time: 06/21/2020 9:50 AM Performed by: Sheppard Evens, CRNA Pre-anesthesia Checklist: Patient identified, Emergency Drugs available, Suction available and Patient being monitored Patient Re-evaluated:Patient Re-evaluated prior to induction Oxygen Delivery Method: Circle System Utilized Preoxygenation: Pre-oxygenation with 100% oxygen Induction Type: IV induction Ventilation: Mask ventilation without difficulty LMA: LMA inserted LMA Size: 5.0 Number of attempts: 1 Airway Equipment and Method: Bite block Placement Confirmation: positive ETCO2 Tube secured with: Tape Dental Injury: Teeth and Oropharynx as per pre-operative assessment

## 2020-06-22 ENCOUNTER — Encounter (HOSPITAL_COMMUNITY): Payer: Self-pay | Admitting: Urology

## 2020-06-22 DIAGNOSIS — N4889 Other specified disorders of penis: Secondary | ICD-10-CM

## 2020-06-22 LAB — RENAL FUNCTION PANEL
Albumin: 1.7 g/dL — ABNORMAL LOW (ref 3.5–5.0)
Anion gap: 12 (ref 5–15)
BUN: 52 mg/dL — ABNORMAL HIGH (ref 6–20)
CO2: 23 mmol/L (ref 22–32)
Calcium: 7.8 mg/dL — ABNORMAL LOW (ref 8.9–10.3)
Chloride: 105 mmol/L (ref 98–111)
Creatinine, Ser: 3.16 mg/dL — ABNORMAL HIGH (ref 0.61–1.24)
GFR, Estimated: 24 mL/min — ABNORMAL LOW (ref >60)
Glucose, Bld: 126 mg/dL — ABNORMAL HIGH (ref 70–99)
Phosphorus: 6.3 mg/dL — ABNORMAL HIGH (ref 2.5–4.6)
Potassium: 3.3 mmol/L — ABNORMAL LOW (ref 3.5–5.1)
Sodium: 140 mmol/L (ref 135–145)

## 2020-06-22 LAB — CBC
HCT: 32.4 % — ABNORMAL LOW (ref 39.0–52.0)
Hemoglobin: 10.1 g/dL — ABNORMAL LOW (ref 13.0–17.0)
MCH: 27.3 pg (ref 26.0–34.0)
MCHC: 31.2 g/dL (ref 30.0–36.0)
MCV: 87.6 fL (ref 80.0–100.0)
Platelets: 229 10*3/uL (ref 150–400)
RBC: 3.7 MIL/uL — ABNORMAL LOW (ref 4.22–5.81)
RDW: 15.9 % — ABNORMAL HIGH (ref 11.5–15.5)
WBC: 9.4 10*3/uL (ref 4.0–10.5)
nRBC: 0 % (ref 0.0–0.2)

## 2020-06-22 LAB — CK: Total CK: 23 U/L — ABNORMAL LOW (ref 49–397)

## 2020-06-22 MED ORDER — POTASSIUM CHLORIDE CRYS ER 20 MEQ PO TBCR
20.0000 meq | EXTENDED_RELEASE_TABLET | Freq: Once | ORAL | Status: AC
  Administered 2020-06-22: 20 meq via ORAL
  Filled 2020-06-22: qty 1

## 2020-06-22 NOTE — Progress Notes (Signed)
Physical Therapy Treatment Patient Details Name: Steven Cortez MRN: [id number] DOB: 23-Nov-1975 Today's Date: 06/22/2020    History of Present Illness Pt is a 45 year old male with PMHx including Hep C and IVDU who presented with back pain, weakness, dyspnea, pleuritic chest pain, urinary retention, and pitting edema. He was recently admitted to [facility] and found to have tricuspid endocarditis with MRSA bacteremia, and pulmonary septic emboli but left AMA. 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 s/pTEE 05/28/20.   2/11 repeat evacuation / extension of decompression    PT Comments    Pt premedicated with IV pain medicine prior to session and additionally encouraged PCA. Pt reporting increased groin pain s/p cytoscopy which is also limiting his mobility. Able to sit edge of bed for ~30-40 mins, which is a significant improvement and stand with min assist from edge of bed, however, still remains unable to ambulate due to pain. He frequently has to return to left sidelying to take breaks and change position. Will continue to progress as tolerated.     Follow Up Recommendations  SNF     Equipment Recommendations  Rolling walker with 5" wheels;3in1 (PT);Wheelchair (measurements PT);Wheelchair cushion (measurements PT)    Recommendations for Other Services       Precautions / Restrictions Precautions Precautions: Fall;Back;Other (comment) Precaution Booklet Issued: Yes (comment) Precaution Comments: PCA Restrictions Weight Bearing Restrictions: No    Mobility  Bed Mobility Overal bed mobility: Needs Assistance Bed Mobility: Rolling;Sidelying to Sit;Sit to Sidelying Rolling: Modified independent (Device/Increase time) Sidelying to sit: Supervision     Sit to sidelying: Min assist General bed mobility comments: No physical assist for rolling  to left side (preferred) and progressing to upright. Significant time/effort. MinA for LE negotiation back into bed    Transfers Overall transfer level: Needs assistance Equipment used: Rolling walker (2 wheeled) Transfers: Sit to/from Stand Sit to Stand: Min assist         General transfer comment: MinA to power up to stand from edge of bed  Ambulation/Gait                 Stairs             Wheelchair Mobility    Modified Rankin (Stroke Patients Only)       Balance Overall balance assessment: Needs assistance Sitting-balance support: Feet supported;Bilateral upper extremity supported Sitting balance-Leahy Scale: Fair     Standing balance support: Bilateral upper extremity supported Standing balance-Leahy Scale: Poor Standing balance comment: Heavy use of RW                            Cognition Arousal/Alertness: Awake/alert Behavior During Therapy: WFL for tasks assessed/performed Overall Cognitive Status: Within Functional Limits for tasks assessed                                        Exercises General Exercises - Lower Extremity Long Arc Quad: Both;10 reps;[facility] Other Exercises Other Exercises: Sitting: AP pelvic tilts, shoulder shurgs, scapular retractions    General Comments        Pertinent Vitals/Pain Pain Assessment: Faces Faces Pain Scale: Hurts worst Pain Location: Back, groin Pain Descriptors / Indicators: Guarding;Grimacing;Spasm;Sharp;Discomfort;Sore Pain Intervention(s): Limited activity within patient's tolerance;Monitored during session;Premedicated  before session;PCA encouraged;Ice applied    Home Living                      Prior Function            PT Goals (current goals can now be found in the care plan section) Acute Rehab PT Goals Patient Stated Goal: to get rid of pain PT Goal Formulation: With patient Time For Goal Achievement: 07/06/20 Potential to Achieve  Goals: Fair    Frequency    Min 3X/week      PT Plan Current plan remains appropriate    Co-evaluation              AM-PAC PT "6 Clicks" Mobility   Outcome Measure  Help needed turning from your back to your side while in a flat bed without using bedrails?: None Help needed moving from lying on your back to sitting on the side of a flat bed without using bedrails?: None Help needed moving to and from a bed to a chair (including a wheelchair)?: A Little Help needed standing up from a chair using your arms (e.g., wheelchair or bedside chair)?: A Little Help needed to walk in hospital room?: A Lot Help needed climbing 3-5 steps with a railing? : Total 6 Click Score: 17    End of Session   Activity Tolerance: Patient limited by pain Patient left: in bed;with call bell/phone within reach Nurse Communication: Mobility status PT Visit Diagnosis: Unsteadiness on feet (R26.81);Pain Pain - Right/Left: Right Pain - part of body: Leg     Time: 1435-1526 PT Time Calculation (min) (ACUTE ONLY): 51 min  Charges:  $Therapeutic Activity: 23-37 mins $Self Care/Home Management: 8-22                     Steven Cortez, PT, DPT Acute Rehabilitation Services Pager [phone] Office [phone]    Steven Cortez 06/22/2020, 4:29 PM

## 2020-06-22 NOTE — Progress Notes (Signed)
Subjective:   Steven Cortez states that he feels well overall today aside from penile pain and persistent hematuria after his foley was exchanged yesterday. He denies any new pain other than mild bilateral flank pain. No fevers, chills, decreased appetite, CP or any other symptoms.   Objective:  Vital signs in last 24 hours: Vitals:   06/22/20 0029 06/22/20 0355 06/22/20 0801 06/22/20 1149  BP:  129/74 (!) 145/87 (!) 139/91  Pulse:  77 79 77  Resp:  18 19 19   Temp:  98.1 F (36.7 C) 98.1 F (36.7 C) 98.6 F (37 C)  TempSrc:  Oral Oral Oral  SpO2:  99% 97% 100%  Weight: 98.6 kg     Height:        Filed Weights   06/20/20 0109 06/21/20 0019 06/22/20 0029  Weight: 98.5 kg 97.6 kg 98.6 kg     Intake/Output Summary (Last 24 hours) at 06/22/2020 1327 Last data filed at 06/22/2020 1304 Gross per 24 hour  Intake 1127 ml  Output 3216 ml  Net -2089 ml   Net IO Since Admission: -30,130.52 mL [06/22/20 1327]  No results for input(s): GLUCAP in the last 72 hours.   Pertinent Labs: CBC Latest Ref Rng & Units 06/22/2020 06/21/2020 06/20/2020  WBC 4.0 - 10.5 K/uL 9.4 11.3(H) 11.2(H)  Hemoglobin 13.0 - 17.0 g/dL 10.1(L) 8.7(L) 7.9(L)  Hematocrit 39.0 - 52.0 % 32.4(L) 26.3(L) 24.0(L)  Platelets 150 - 400 K/uL 229 214 197    CMP Latest Ref Rng & Units 06/22/2020 06/21/2020 06/20/2020  Glucose 70 - 99 mg/dL 06/22/2020) 361(W) 94  BUN 6 - 20 mg/dL 431(V) 40(G) 86(P)  Creatinine 0.61 - 1.24 mg/dL 61(P) 5.09(T) 2.67(T)  Sodium 135 - 145 mmol/L 140 138 138  Potassium 3.5 - 5.1 mmol/L 3.3(L) 3.3(L) 3.3(L)  Chloride 98 - 111 mmol/L 105 104 106  CO2 22 - 32 mmol/L 23 24 22   Calcium 8.9 - 10.3 mg/dL 7.8(L) 7.6(L) 7.2(L)  Total Protein 6.5 - 8.1 g/dL - - -  Total Bilirubin 0.3 - 1.2 mg/dL - - -  Alkaline Phos 38 - 126 U/L - - -  AST 15 - 41 U/L - - -  ALT 0 - 44 U/L - - -    Imaging: No results found. Physical Exam  General: Patient appears well. No acute distress. Eyes: Sclera  non-icteric. No conjunctival injection.  HENT: Neck is supple. No nasal discharge. Respiratory: Lungs are CTA, bilaterally. No wheezes, rales, or rhonchi.  Cardiovascular: Regular rate and rhythm. There is a 3/6 early systolic, harsh murmur along the lower left sternal border. No other murmurs, rubs or gallops. 1+ bilateral LE pitting edema extends just above the knees.  Abdominal: Soft and non-tender to palpation. Bowel sounds intact. No rebound or guarding. GU: Mild left CVA tenderness to palpation. 2.45(Y  Psych: Normal affect. Normal tone of voice.   Assessment/Plan: Steven Cortez is a 45 y.o. 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 Hospital course complicated by acute renal failure requiring HD and large epidural abscess s/p laminectomy x2 (1/29 and 2/11). Now on long course IV abx treatment and pain management.   Principal Problem:   Endocarditis Active Problems:   Urinary retention   Acute renal failure (HCC)   Hyperkalemia   Microcytic anemia   MRSA infection   Epidural abscess   Opioid use disorder   Hydronephrosis   Malnutrition of moderate  degree   Antibiotic long-term use   Chronic viral hepatitis B without delta agent and without coma (HCC)   Fever   Anemia of chronic disease  MRSA TV Endocarditis w/ Bacteremia Complicated by Septic Emboli and Epidural Abscess s/p I&D w/ laminectomy x 2 Patient continues to remain afebrile and HDS on IV Daptomycin.  --Continue daptomycin for total of 8 weeks --PRN Tylenol for fevers --PCA with PRN IV dilaudid during PT/OT sessions for pain --Robaxin 750mg  three times daily --Will try to transition off PCA pump as mobility and pain improves  Ventricular Bigeminy Patient noted 06/17/20 to have frequent bigeminy, which has significantly improved since Coreg dosing was increased without recurrent overnight.  --Continue Coreg 25mg  twice dialy   Acute  Kidney Injury on CKD  Nephrotic Range Proteinuria  Bilateral Hydronephrosis  Hematuria  Creatinine continues to slowly increase today, up from ~1.5 this admission. Nephrology consulted due to nephrotic range proteinuria and AKI and urology consulted for persistent hematuria and troubles with Phimosis previously. CT urogram showed moderate right and mild left-sided UPJ obstruction with incomplete bladder emptying thought to possibly be due to malpositioned foley. Cystoscopy performed which showed erythema of the posterior superior part of the bladder neck. Area was biopsied. Nephrology believe retention may be in setting of neurogenic bladder given spinal injury (and interval improvement after decompressive surgery), although may be in setting of IgA nephropathy. Patient is clinically hypervolemic with 1+ pitting edema bilaterally.  --Appreciate nephrology and urology recs  --Continue to avoid fluid hydration as able  --Continue to trend daily BMP  --Avoid nephrotoxic agents  --Continue TED hose and LE elevation --Bladder bx pending  Hyperkalemia Potassium remains low despite replacement.  - Will give 06/19/20 PO KCl  - Consider urine studies vs. Addition of spironolactone if persistent   Normocytic Anemia, Stable S/p 7 u of pRBC transfusion. Hemoglobin has significantly improved over the past several days. Was likely in setting of gross hematuria.   --Trend daily CBC  Hypertension, Stable --Continue Coreg 25 mg twice daily --Continue amlodipine 10 mg daily  Diet: Renal diet IVF: None VTE: Lovenox CODE: Full Code  Prior to Admission Living Arrangement: Home Anticipated Discharge Location: Home w/ HH Barriers to Discharge: Medical work up  Signed: , MD 06/22/2020, 1:27 PM  Pager: [phone] Internal Medicine Teaching Service After 5pm on weekdays and 1pm on weekends: On Call pager: [phone]

## 2020-06-22 NOTE — Progress Notes (Signed)
S:  1) Right > Left UPJ hydro appearance without or without foley. Cysto RGP with good washout 06/21/2020. UOP 3L per day. Cr stabilizing around 3.   2) gross hematuria - cysto, bbx (foley irritation) 06/21/2020 with malposition of balloon in urethra noted; CT, [place] benign most recent 06/18/2020.  3) Incomplete bladder emptying - pt with distended bladder on imaging.   Pt without complaints. Resting.   O: .  Intake/Output Summary (Last 24 hours) at 06/22/2020 1627 Last data filed at 06/22/2020 1304 Gross per 24 hour  Intake 590 ml  Output 3216 ml  Net -2626 ml   NAD Urine light red in bag and clear in tubing   A/P:  1) Right > Left UPJ hydro appearance without or without foley. Cysto RGP with good washout 06/21/2020. UOP 3L per day. Cr stabilizing around 3.   2) gross hematuria - benign evaluation   3) inc bladder emptying - Continue foley. UOP remains strong.At some point we will need to remove catheter again and follow pt's UOP, kidney function and PVR.

## 2020-06-22 NOTE — Progress Notes (Signed)
Patient ID: Steven Cortez, male   DOB: 08/20/1975, 45 y.o.   MRN: [id number] S: complaining of groin pain. O:BP (!) 145/87 (BP Location: Left Arm)   Pulse 79   Temp 98.1 F (36.7 C) (Oral)   Resp 19   Ht 5\' 10"  (1.778 m)   Wt 98.6 kg   SpO2 97%   BMI 31.19 kg/m   Intake/Output Summary (Last 24 hours) at 06/22/2020 1122 Last data filed at 06/22/2020 0359 Gross per 24 hour  Intake 837 ml  Output 1800 ml  Net -963 ml   Intake/Output: I/O last 3 completed shifts: In: 1437 [P.O.:957; I.V.:30; IV Piggyback:450] Out: 3825 [Urine:3825]  Intake/Output this shift:  No intake/output data recorded. Weight change: 1 kg Gen: chronically ill-appearing but in NAD CVS: RRR Resp: CTA Abd: +BS, soft, NT/ND Ext: 1+ bilateral lower extremity edema  Recent Labs  Lab 06/16/20 0500 06/17/20 0417 06/18/20 0500 06/19/20 0512 06/20/20 0435 06/21/20 0437 06/22/20 0551  NA 140 140 139 140 138 138 140  K 4.2 4.3 3.7 3.4* 3.3* 3.3* 3.3*  CL 102 104 105 108 106 104 105  CO2 25 26 23 23 22 24 23   GLUCOSE 122* 101* 87 103* 94 100* 126*  BUN 72* 65* 58* 54* 52* 53* 52*  CREATININE 3.75* 3.51* 3.18* 2.99* 3.04* 3.06* 3.16*  ALBUMIN 1.9* 1.7* 1.6* 1.5* 1.5* 1.6* 1.7*  CALCIUM 7.9* 7.4* 7.0* 7.0* 7.2* 7.6* 7.8*  PHOS 6.1* 5.9* 5.5* 5.3* 5.5* 6.1* 6.3*   Liver Function Tests: Recent Labs  Lab 06/20/20 0435 06/21/20 0437 06/22/20 0551  ALBUMIN 1.5* 1.6* 1.7*   No results for input(s): LIPASE, AMYLASE in the last 168 hours. No results for input(s): AMMONIA in the last 168 hours. CBC: Recent Labs  Lab 06/18/20 0500 06/18/20 1617 06/19/20 0512 06/20/20 0435 06/21/20 0437 06/22/20 0551  WBC 10.1  --  11.5* 11.2* 11.3* 9.4  HGB 6.9*   < > 7.9* 7.9* 8.7* 10.1*  HCT 22.2*   < > 23.9* 24.0* 26.3* 32.4*  MCV 90.2  --  87.2 86.6 85.9 87.6  PLT 192  --  190 197 214 229   < > = values in this interval not displayed.   Cardiac Enzymes: Recent Labs  Lab 06/22/20 0551  CKTOTAL 23*    CBG: No results for input(s): GLUCAP in the last 168 hours.  Iron Studies: No results for input(s): IRON, TIBC, TRANSFERRIN, FERRITIN in the last 72 hours. Studies/Results: DG Cystogram  Result Date: 06/21/2020 CLINICAL DATA:  Cystogram. EXAM: CYSTOGRAM - 3+ VIEW CONTRAST:  Not provided FLUOROSCOPY TIME:  27 seconds COMPARISON:  Renal ultrasound-06/18/2020; CT abdomen pelvis-06/05/2020 FINDINGS: Six spot intraoperative fluoroscopic images during bilateral retrograde pyelogram are provided for review Initial image demonstrates a cannulation opacification the distal aspect of the right ureter. Redemonstrated right-sided pelvicaliectasis with transition site suspected at the level of the right UPJ Subsequent images demonstrate selective cannulation opacification of distal aspect of the left ureter. There is mild dilatation of the left renal pelvis without caliectasis No discrete filling defects are seen with the pacified portions of either ureters or collecting systems. IMPRESSION: Intraoperative bilateral retrograde pyelogram as above. Electronically Signed   By: 06/20/2020 M.D.   On: 06/21/2020 12:40   . alteplase  2 mg Intracatheter Once  . amLODipine  10 mg Oral Daily  . calcium carbonate  1 tablet Oral TID WC  . carvedilol  25 mg Oral BID WC  . Chlorhexidine Gluconate Cloth  6 each Topical  Daily  . enoxaparin (LOVENOX) injection  40 mg Subcutaneous Daily  . feeding supplement  237 mL Oral BID BM  . ferrous sulfate  325 mg Oral Q breakfast  . HYDROmorphone   Intravenous Q4H  . methocarbamol  750 mg Oral TID AC & HS  . multivitamin with minerals  1 tablet Oral Daily  . polyethylene glycol  17 g Oral BID  . senna-docusate  2 tablet Oral BID  . sodium chloride flush  10-40 mL Intracatheter Q12H    BMET    Component Value Date/Time   NA 140 06/22/2020 0551   K 3.3 (L) 06/22/2020 0551   CL 105 06/22/2020 0551   CO2 23 06/22/2020 0551   GLUCOSE 126 (H) 06/22/2020 0551   BUN 52 (H)  06/22/2020 0551   CREATININE 3.16 (H) 06/22/2020 0551   CALCIUM 7.8 (L) 06/22/2020 0551   GFRNONAA 24 (L) 06/22/2020 0551   CBC    Component Value Date/Time   WBC 9.4 06/22/2020 0551   RBC 3.70 (L) 06/22/2020 0551   HGB 10.1 (L) 06/22/2020 0551   HCT 32.4 (L) 06/22/2020 0551   PLT 229 06/22/2020 0551   MCV 87.6 06/22/2020 0551   MCH 27.3 06/22/2020 0551   MCHC 31.2 06/22/2020 0551   RDW 15.9 (H) 06/22/2020 0551   LYMPHSABS 1.8 06/12/2020 0718   MONOABS 1.1 (H) 06/12/2020 0718   EOSABS 0.0 06/12/2020 0718   BASOSABS 0.0 06/12/2020 0718   Summary:  45 y.o. 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     Assessment/Plan:  1. AKI - Non-Oliguric AKI: ATN requiring dialysis early in the hospitalization now no longer requiring dialysis.  Creatinine improved to 1.5 but steadily rising creatinine for several days.  Also now with significant proteinuria 13 g.  Also with bilateral hydronephrosis on renal ultrasound.  Possible intermittent urinary retention related to infectious issues of the spine.  Concern for recent rising creatinine possibly related to hydronephrosis but periinfectious GN also possible.  Note normal complement. ANA and ANCA negative.  Scr stabilizing at 3-3.2 1. Continue foley catheter 2. Off IVF's due to edema 3. Avoid ACE/ARB due to AKI 4. Would not perform renal biopsy given risk and h/o  IVDU.  Doubt it would change treatment plans at this time. 2. Bilateral hydronephrosis - s/p cystoscopy, gross hematuria, malpositioned foley, moderate right and mild left UPJ obstruction, incomplete bladder emptying.  Balloon was in urethra on cysto and was placed in bladder with good drainage.  Possible neurogenic bladder so will keep foley in place.  Urology following.  3. MRSA tricuspid endocarditis complicated by septic emboli and epidural abscess -  daptomycin per primary svc. 4. HTN/volume- stable BP but has significant edema.  Now with better UOP s/p replacement of foley catheter.  Continue to follow and hold IVF's.   Irena Cords, MD [employer] [phone]

## 2020-06-23 DIAGNOSIS — N189 Chronic kidney disease, unspecified: Secondary | ICD-10-CM

## 2020-06-23 DIAGNOSIS — I129 Hypertensive chronic kidney disease with stage 1 through stage 4 chronic kidney disease, or unspecified chronic kidney disease: Secondary | ICD-10-CM

## 2020-06-23 LAB — RENAL FUNCTION PANEL
Albumin: 1.6 g/dL — ABNORMAL LOW (ref 3.5–5.0)
Anion gap: 13 (ref 5–15)
BUN: 50 mg/dL — ABNORMAL HIGH (ref 6–20)
CO2: 23 mmol/L (ref 22–32)
Calcium: 7.6 mg/dL — ABNORMAL LOW (ref 8.9–10.3)
Chloride: 102 mmol/L (ref 98–111)
Creatinine, Ser: 3.14 mg/dL — ABNORMAL HIGH (ref 0.61–1.24)
GFR, Estimated: 24 mL/min — ABNORMAL LOW (ref >60)
Glucose, Bld: 124 mg/dL — ABNORMAL HIGH (ref 70–99)
Phosphorus: 6.1 mg/dL — ABNORMAL HIGH (ref 2.5–4.6)
Potassium: 3.3 mmol/L — ABNORMAL LOW (ref 3.5–5.1)
Sodium: 138 mmol/L (ref 135–145)

## 2020-06-23 LAB — CBC
HCT: 25.4 % — ABNORMAL LOW (ref 39.0–52.0)
Hemoglobin: 7.9 g/dL — ABNORMAL LOW (ref 13.0–17.0)
MCH: 27.2 pg (ref 26.0–34.0)
MCHC: 31.1 g/dL (ref 30.0–36.0)
MCV: 87.6 fL (ref 80.0–100.0)
Platelets: 215 10*3/uL (ref 150–400)
RBC: 2.9 MIL/uL — ABNORMAL LOW (ref 4.22–5.81)
RDW: 15.9 % — ABNORMAL HIGH (ref 11.5–15.5)
WBC: 9.2 10*3/uL (ref 4.0–10.5)
nRBC: 0 % (ref 0.0–0.2)

## 2020-06-23 LAB — SURGICAL PATHOLOGY

## 2020-06-23 LAB — HEMOGLOBIN AND HEMATOCRIT, BLOOD
HCT: 24.9 % — ABNORMAL LOW (ref 39.0–52.0)
Hemoglobin: 8.2 g/dL — ABNORMAL LOW (ref 13.0–17.0)

## 2020-06-23 MED ORDER — POTASSIUM CHLORIDE CRYS ER 20 MEQ PO TBCR
20.0000 meq | EXTENDED_RELEASE_TABLET | Freq: Once | ORAL | Status: AC
  Administered 2020-06-23: 20 meq via ORAL
  Filled 2020-06-23: qty 1

## 2020-06-23 MED ORDER — ENOXAPARIN SODIUM 40 MG/0.4ML ~~LOC~~ SOLN
40.0000 mg | Freq: Every day | SUBCUTANEOUS | Status: DC
  Administered 2020-06-23 – 2020-07-14 (×22): 40 mg via SUBCUTANEOUS
  Filled 2020-06-23 (×23): qty 0.4

## 2020-06-23 NOTE — Progress Notes (Signed)
Patient ID: Steven Cortez, male   DOB: July 07, 1975, 45 y.o.   MRN: [id number] S: Still complaining of back pain.  O:BP (!) 137/97 (BP Location: Left Arm)   Pulse 77   Temp 98.8 F (37.1 C) (Oral)   Resp 16   Ht 5\' 10"  (1.778 m)   Wt 92.9 kg   SpO2 97%   BMI 29.39 kg/m   Intake/Output Summary (Last 24 hours) at 06/23/2020 1240 Last data filed at 06/23/2020 0849 Gross per 24 hour  Intake 1706 ml  Output 2100 ml  Net -394 ml   Intake/Output: I/O last 3 completed shifts: In: 1486 [P.O.:1486] Out: 4516 [Urine:4516]  Intake/Output this shift:  Total I/O In: 340 [P.O.:340] Out: -  Weight change: -5.7 kg Gen: NAD CVS: RRR Resp: cta Abd: +Bs, soft,NT/Nd Ext: 1+ BLE edema  Recent Labs  Lab 06/17/20 0417 06/18/20 0500 06/19/20 0512 06/20/20 0435 06/21/20 0437 06/22/20 0551 06/23/20 0450  NA 140 139 140 138 138 140 138  K 4.3 3.7 3.4* 3.3* 3.3* 3.3* 3.3*  CL 104 105 108 106 104 105 102  CO2 26 23 23 22 24 23 23   GLUCOSE 101* 87 103* 94 100* 126* 124*  BUN 65* 58* 54* 52* 53* 52* 50*  CREATININE 3.51* 3.18* 2.99* 3.04* 3.06* 3.16* 3.14*  ALBUMIN 1.7* 1.6* 1.5* 1.5* 1.6* 1.7* 1.6*  CALCIUM 7.4* 7.0* 7.0* 7.2* 7.6* 7.8* 7.6*  PHOS 5.9* 5.5* 5.3* 5.5* 6.1* 6.3* 6.1*   Liver Function Tests: Recent Labs  Lab 06/21/20 0437 06/22/20 0551 06/23/20 0450  ALBUMIN 1.6* 1.7* 1.6*   No results for input(s): LIPASE, AMYLASE in the last 168 hours. No results for input(s): AMMONIA in the last 168 hours. CBC: Recent Labs  Lab 06/19/20 0512 06/20/20 0435 06/21/20 0437 06/22/20 0551 06/23/20 0612 06/23/20 0700  WBC 11.5* 11.2* 11.3* 9.4 9.2  --   HGB 7.9* 7.9* 8.7* 10.1* 7.9* 8.2*  HCT 23.9* 24.0* 26.3* 32.4* 25.4* 24.9*  MCV 87.2 86.6 85.9 87.6 87.6  --   PLT 190 197 214 229 215  --    Cardiac Enzymes: Recent Labs  Lab 06/22/20 0551  CKTOTAL 23*   CBG: No results for input(s): GLUCAP in the last 168 hours.  Iron Studies: No results for input(s): IRON, TIBC,  TRANSFERRIN, FERRITIN in the last 72 hours. Studies/Results: No results found. 08/23/20 amLODipine  10 mg Oral Daily  . calcium carbonate  1 tablet Oral TID WC  . carvedilol  25 mg Oral BID WC  . Chlorhexidine Gluconate Cloth  6 each Topical Daily  . enoxaparin (LOVENOX) injection  40 mg Subcutaneous Daily  . feeding supplement  237 mL Oral BID BM  . ferrous sulfate  325 mg Oral Q breakfast  . HYDROmorphone   Intravenous Q4H  . methocarbamol  750 mg Oral TID AC & HS  . multivitamin with minerals  1 tablet Oral Daily  . polyethylene glycol  17 g Oral BID  . senna-docusate  2 tablet Oral BID  . sodium chloride flush  10-40 mL Intracatheter Q12H    BMET    Component Value Date/Time   NA 138 06/23/2020 0450   K 3.3 (L) 06/23/2020 0450   CL 102 06/23/2020 0450   CO2 23 06/23/2020 0450   GLUCOSE 124 (H) 06/23/2020 0450   BUN 50 (H) 06/23/2020 0450   CREATININE 3.14 (H) 06/23/2020 0450   CALCIUM 7.6 (L) 06/23/2020 0450   GFRNONAA 24 (L) 06/23/2020 0450   CBC  Component Value Date/Time   WBC 9.2 06/23/2020 0612   RBC 2.90 (L) 06/23/2020 0612   HGB 8.2 (L) 06/23/2020 0700   HCT 24.9 (L) 06/23/2020 0700   PLT 215 06/23/2020 0612   MCV 87.6 06/23/2020 0612   MCH 27.2 06/23/2020 0612   MCHC 31.1 06/23/2020 0612   RDW 15.9 (H) 06/23/2020 0612   LYMPHSABS 1.8 06/12/2020 0718   MONOABS 1.1 (H) 06/12/2020 0718   EOSABS 0.0 06/12/2020 0718   BASOSABS 0.0 06/12/2020 0718    Summary:  44 y.o.malewith a past medical history significant for IVDU, HTN, hepatitis C, admitted for tricuspid MRSA endocarditis with bacteremia and septic embolization as well as kidney injury.   Assessment/Plan:  1. AKI - Non-Oliguric AKI: ATN requiring dialysis early in the hospitalization now no longer requiring dialysis. Creatinine improved to 1.5 but steadily rising creatinine for several days. Also now with significant proteinuria 13 g. Also with bilateral hydronephrosis on renal ultrasound.  Possible intermittent urinary retention related to infectious issues of the spine. Concern for recent rising creatinine possibly related to hydronephrosis but periinfectious GN also possible. Note normal complement. ANA and ANCA negative.  Scr stabilizing at 3-3.2 1. Continue foley catheter 2. Off IVF's due to edema 3. Avoid ACE/ARB due to AKI 4. Would not perform renal biopsy given risk and h/o  IVDU.  Doubt it would change treatment plans at this time. 2. Bilateral hydronephrosis - s/p cystoscopy, gross hematuria, malpositioned foley, moderate right and mild left UPJ obstruction, incomplete bladder emptying.  Balloon was in urethra on cysto and was placed in bladder with good drainage.  Possible neurogenic bladder so will keep foley in place.  Urology following.  3. 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 4. HTN/volume- stable BP but has significant edema.  Now with better UOP s/p replacement of foley catheter.  Continue to follow and hold IVF's.    Irena Cords, MD [employer] [phone]

## 2020-06-23 NOTE — Progress Notes (Signed)
Occupational Therapy Treatment Patient Details Name: Steven Cortez MRN: [id number] DOB: Jan 18, 1976 Today's Date: 06/23/2020    History of present illness Pt is a 45 year old male with PMHx including Hep C and IVDU who presented with back pain, weakness, dyspnea, pleuritic chest pain, urinary retention, and pitting edema. He was recently admitted to [facility] and found to have tricuspid endocarditis with MRSA bacteremia, and pulmonary septic emboli but left AMA. 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 s/pTEE 05/28/20.   2/11 repeat evacuation / extension of decompression   OT comments  Pt making steady progress towards OT goals this session. Pt continues to present with increased pain, impaired balance and decreased activity tolerance however pt able to complete sit<>stand this session with MIN A +1 able to maintain static standing with BUE support ~ 2 mins. Pt also able to take steps forward and backward with RW and MIN A. Pt sat EOB ~ 10 mins with BUE supported for therex as indicated below. Pt would continue to benefit from skilled occupational therapy while admitted and after d/c to address the below listed limitations in order to improve overall functional mobility and facilitate independence with BADL participation. DC plan remains appropriate, will follow acutely per POC.     Follow Up Recommendations  Supervision/Assistance - 24 hour;SNF    Equipment Recommendations  3 in 1 bedside commode    Recommendations for Other Services      Precautions / Restrictions Precautions Precautions: Fall;Back;Other (comment) Precaution Booklet Issued: No Precaution Comments: education provided throuhgout session on back precautions into relation to mobility tasks Restrictions Weight Bearing Restrictions: No Other Position/Activity Restrictions: PCA        Mobility Bed Mobility Overal bed mobility: Needs Assistance Bed Mobility: Rolling;Sidelying to Sit;Sit to Sidelying Rolling: Modified independent (Device/Increase time) Sidelying to sit: Min guard     Sit to sidelying: Min assist General bed mobility comments: no physical assist needed for rolling, MIN guard for safety and  line mgmt to elevate trunk into sitting, MIN A to return to sidelying with pt needing light MIN  assist to elevate BLEs back to bed    Transfers Overall transfer level: Needs assistance Equipment used: Rolling walker (2 wheeled) Transfers: Sit to/from Stand Sit to Stand: Min assist         General transfer comment: MIN A to rise from EOB, initial steadying assist    Balance Overall balance assessment: Needs assistance Sitting-balance support: Feet supported;Bilateral upper extremity supported Sitting balance-Leahy Scale: Poor Sitting balance - Comments: reliant on BUE support for static sitting   Standing balance support: Bilateral upper extremity supported Standing balance-Leahy Scale: Poor Standing balance comment: Heavy use of RW                           ADL either performed or assessed with clinical judgement   ADL Overall ADL's : Needs assistance/impaired                     Lower Body Dressing: Total assistance;Bed level Lower Body Dressing Details (indicate cue type and reason): to don socks from bed level Toilet Transfer: Minimal assistance;RW Toilet Transfer Details (indicate cue type and reason): unable to transfer d/t pain but able to sit<>stand from EOB with RW and MIN A, pt additionally able to take 3 steps forward and backwards from  EOB with MIN A +1 for steadying assist         Functional mobility during ADLs: Minimal assistance;Rolling walker General ADL Comments: pt with improvements in pain tolerance this session able to complete therex from EOB , complete sit<>stand and pre gait activities from EOB as  precursor to higher level functional mobility tasks     Vision       Perception     Praxis      Cognition Arousal/Alertness: Awake/alert Behavior During Therapy: WFL for tasks assessed/performed Overall Cognitive Status: Within Functional Limits for tasks assessed                                          Exercises General Exercises - Lower Extremity Long Arc Quad: Both;10 reps;Seated;AROM Hip Flexion/Marching: Both;10 reps;Seated;AROM   Shoulder Instructions       General Comments pt on 2L during session with sats [facility]    Pertinent Vitals/ Pain       Pain Assessment: Faces Pain Score: 4  Pain Location: back Pain Descriptors / Indicators: Guarding;Grimacing;Spasm;Sharp;Discomfort;Sore Pain Intervention(s): Limited activity within patient's tolerance;Monitored during session;Repositioned;PCA encouraged  Home Living                                          Prior Functioning/Environment              Frequency  Min 2X/week        Progress Toward Goals  OT Goals(current goals can now be found in the care plan section)  Progress towards OT goals: Progressing toward goals  Acute Rehab OT Goals Patient Stated Goal: to get out of here OT Goal Formulation: With patient Time For Goal Achievement: 06/25/20 Potential to Achieve Goals: Fair  Plan Discharge plan remains appropriate;Frequency remains appropriate    Co-evaluation                 AM-PAC OT "6 Clicks" Daily Activity     Outcome Measure   Help from another person eating meals?: A Little (s/u) Help from another person taking care of personal grooming?: A Little Help from another person toileting, which includes using toliet, bedpan, or urinal?: A Lot Help from another person bathing (including washing, rinsing, drying)?: A Lot Help from another person to put on and taking off regular upper body clothing?: A Lot Help from another person to put on and taking  off regular lower body clothing?: Total 6 Click Score: 13    End of Session Equipment Utilized During Treatment: Gait belt;Rolling walker;Oxygen;Other (comment) (2L [place])  OT Visit Diagnosis: Other abnormalities of gait and mobility (R26.89);Muscle weakness (generalized) (M62.81);Pain Pain - part of body:  (back)   Activity Tolerance Patient tolerated treatment well   Patient Left in bed;with call bell/phone within reach;with bed alarm set   Nurse Communication Mobility status        Time: 5176-1607 OT Time Calculation (min): 41 min  Charges: OT Treatments $Therapeutic Activity: 38-52 mins  Lenor Derrick., COTA/L Acute Rehabilitation Services [phone]    Barron Schmid 06/23/2020, 1:12 PM

## 2020-06-23 NOTE — Progress Notes (Signed)
Subjective:   Steven Cortez states that he had been able to sit up in bed and stand with PT/OT today although not as long as yesterday. Wishes he were able to spend more time with PT/OT during sessions. Pain is currently well-controlled. Denies any abdominal pain. Denies difficulty passing his stools. Urinary catheter in place. Notes some chest pain when he rolls over, describes it as a soreness. Endorses bilateral back pain when rolling in bed. Denies fever, chills, lightheadedness, dizziness, or nausea.  Eating/drinking well without complications.   Objective:  Vital signs in last 24 hours: Vitals:   06/23/20 0853 06/23/20 1159 06/23/20 1200 06/23/20 1543  BP:  (!) 137/97  131/71  Pulse:  77  75  Resp: 18 18 16 20   Temp:  98.8 F (37.1 C)  98.7 F (37.1 C)  TempSrc:  Oral  Oral  SpO2: 100% 99% 97% 99%  Weight:      Height:        Filed Weights   06/21/20 0019 06/22/20 0029 06/23/20 0017  Weight: 97.6 kg 98.6 kg 92.9 kg     Intake/Output Summary (Last 24 hours) at 06/23/2020 1543 Last data filed at 06/23/2020 1316 Gross per 24 hour  Intake 1653 ml  Output 2100 ml  Net -447 ml   Net IO Since Admission: -30,577.52 mL [06/23/20 1543]  No results for input(s): GLUCAP in the last 72 hours.   Pertinent Labs: CBC Latest Ref Rng & Units 06/23/2020 06/23/2020 06/22/2020  WBC 4.0 - 10.5 K/uL - 9.2 9.4  Hemoglobin 13.0 - 17.0 g/dL 8.2(L) 7.9(L) 10.1(L)  Hematocrit 39.0 - 52.0 % 24.9(L) 25.4(L) 32.4(L)  Platelets 150 - 400 K/uL - 215 229    CMP Latest Ref Rng & Units 06/23/2020 06/22/2020 06/21/2020  Glucose 70 - 99 mg/dL 06/23/2020) 132(G) 401(U)  BUN 6 - 20 mg/dL 272(Z) 36(U) 44(I)  Creatinine 0.61 - 1.24 mg/dL 34(V) 4.25(Z) 5.63(O)  Sodium 135 - 145 mmol/L 138 140 138  Potassium 3.5 - 5.1 mmol/L 3.3(L) 3.3(L) 3.3(L)  Chloride 98 - 111 mmol/L 102 105 104  CO2 22 - 32 mmol/L 23 23 24   Calcium 8.9 - 10.3 mg/dL 7.6(L) 7.8(L) 7.6(L)  Total Protein 6.5 - 8.1 g/dL - - -  Total Bilirubin  0.3 - 1.2 mg/dL - - -  Alkaline Phos 38 - 126 U/L - - -  AST 15 - 41 U/L - - -  ALT 0 - 44 U/L - - -    Imaging: No results found. Physical Exam  General: Patient appears chronically ill. No acute distress. Eyes: Sclera non-icteric. No conjunctival injection.  Respiratory: There are mild bilateral inspiratory wheezes without rhonchi. No tachypnea or increased work of breathing on 2L [place].  Cardiovascular: Telemetry showed bigeminy during examination. There is a 2/6 early systolic, harsh murmur along the lower left sternal border and right upper chest. No other murmurs, rubs or gallops. Slight improvement in 1+ bilateral lower extremity pitting edema, below the knees, with SCD's in place.  Abdominal: Soft and non-tender to palpation. No rebound or guarding.  GU: Mild L CVA tenderness to palpation Psych: Flat affect. Normal tone of voice.   Assessment/Plan: Steven Cortez is a 45 y.o. 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 Hospital course complicated by acute renal failure requiring HD and large epidural abscess s/p laminectomy x2 (1/29 and 2/11). Now on long course IV abx treatment.  Principal Problem:   Endocarditis Active Problems:   Urinary retention   Acute renal failure (HCC)   Hyperkalemia   Microcytic anemia   MRSA infection   Epidural abscess   Opioid use disorder   Hydronephrosis   Malnutrition of moderate degree   Antibiotic long-term use   Chronic viral hepatitis B without delta agent and without coma (HCC)   Fever   Anemia of chronic disease  MRSA TV Endocarditis w/ Bacteremia Complicated by Septic Emboli and Epidural Abscess s/p I&D w/ laminectomy x 2 Patient continues to remain afebrile and HDS on IV Daptomycin.  --Continue daptomycin for total of 8 weeks --PRN Tylenol for fevers --PCA with PRN IV dilaudid during PT/OT sessions for pain --Robaxin 750mg  three times  daily --Will try to transition off PCA pump as mobility and pain improves  Ventricular Bigeminy Patient noted 06/17/20 to have frequent bigeminy which initially improved with Coreg, although patient has again developed frequent asymptomatic bigeminy. HDS. Discussed with cardiology. Given normal systolic function currently, will hold off on addition of antiarrhythmic agent. --Continue Coreg 25mg  twice dialy   Acute Kidney Injury on CKD  Nephrotic Range Proteinuria  Bilateral Hydronephrosis  Hematuria  Creatinine has stabilized just over 3, up from ~1.5 this admission. Nephrology consulted due to nephrotic range proteinuria and AKI and urology consulted for persistent hematuria and troubles with Phimosis previously. CT urogram showed moderate right and mild left-sided UPJ obstruction with incomplete bladder emptying thought to possibly be due to malpositioned foley. Cystoscopy performed which showed erythema of the posterior superior part of the bladder neck. Area was biopsied. Nephrology believe retention may be in setting of neurogenic bladder given spinal injury (and interval improvement after decompressive surgery), although may be in setting of IgA nephropathy. Patient is clinically hypervolemic, although has high urine output and LE edema is improving.  --Appreciate nephrology and urology recs  --Continue to avoid fluid hydration as able  --Continue to trend daily BMP  --Avoid nephrotoxic agents  --Continue TED hose and LE elevation --Bladder bx pending  Hypokalemia Potassium remains low despite replacement, likely due to increased urine volumes.  --Continue to monitor with oral replacement for now as needed  Normocytic Anemia, Stable S/p 7 u of pRBC transfusion. Hemoglobin dropped from 10.1 to 7.9, although remains stable on repeat H&H this morning. Likely due to persistent hematuria.    --Restarted Lovenox for DVT PPx given stability --Continue close monitoring   Hypertension,  Stable --Continue Coreg 25 mg twice daily --Continue amlodipine 10 mg daily  Diet: Renal diet IVF: None VTE: Lovenox CODE: Full Code  Prior to Admission Living Arrangement: Home Anticipated Discharge Location: Home w/ HH Barriers to Discharge: Medical work up  Signed: 06/19/20, MD 06/23/2020, 3:43 PM  Pager: [phone] Internal Medicine Teaching Service After 5pm on weekdays and 1pm on weekends: On Call pager: [phone]

## 2020-06-24 LAB — COMPREHENSIVE METABOLIC PANEL
ALT: 27 U/L (ref 0–44)
AST: 19 U/L (ref 15–41)
Albumin: 1.7 g/dL — ABNORMAL LOW (ref 3.5–5.0)
Alkaline Phosphatase: 163 U/L — ABNORMAL HIGH (ref 38–126)
Anion gap: 10 (ref 5–15)
BUN: 49 mg/dL — ABNORMAL HIGH (ref 6–20)
CO2: 24 mmol/L (ref 22–32)
Calcium: 7.8 mg/dL — ABNORMAL LOW (ref 8.9–10.3)
Chloride: 102 mmol/L (ref 98–111)
Creatinine, Ser: 3.07 mg/dL — ABNORMAL HIGH (ref 0.61–1.24)
GFR, Estimated: 25 mL/min — ABNORMAL LOW (ref >60)
Glucose, Bld: 97 mg/dL (ref 70–99)
Potassium: 3.4 mmol/L — ABNORMAL LOW (ref 3.5–5.1)
Sodium: 136 mmol/L (ref 135–145)
Total Bilirubin: 0.9 mg/dL (ref 0.3–1.2)
Total Protein: 5.9 g/dL — ABNORMAL LOW (ref 6.5–8.1)

## 2020-06-24 LAB — CBC
HCT: 24.1 % — ABNORMAL LOW (ref 39.0–52.0)
Hemoglobin: 7.7 g/dL — ABNORMAL LOW (ref 13.0–17.0)
MCH: 27.7 pg (ref 26.0–34.0)
MCHC: 32 g/dL (ref 30.0–36.0)
MCV: 86.7 fL (ref 80.0–100.0)
Platelets: 223 10*3/uL (ref 150–400)
RBC: 2.78 MIL/uL — ABNORMAL LOW (ref 4.22–5.81)
RDW: 15.9 % — ABNORMAL HIGH (ref 11.5–15.5)
WBC: 8.1 10*3/uL (ref 4.0–10.5)
nRBC: 0 % (ref 0.0–0.2)

## 2020-06-24 LAB — PHOSPHORUS: Phosphorus: 6.1 mg/dL — ABNORMAL HIGH (ref 2.5–4.6)

## 2020-06-24 LAB — MAGNESIUM: Magnesium: 1.9 mg/dL (ref 1.7–2.4)

## 2020-06-24 MED ORDER — POTASSIUM CHLORIDE CRYS ER 20 MEQ PO TBCR
40.0000 meq | EXTENDED_RELEASE_TABLET | Freq: Once | ORAL | Status: AC
  Administered 2020-06-24: 40 meq via ORAL
  Filled 2020-06-24: qty 2

## 2020-06-24 NOTE — Progress Notes (Signed)
PCA syringe replaced.  6.2 ml of hydromorphone from previous syringe wasted in stericycle a with Chief Operating Officer.

## 2020-06-24 NOTE — Anesthesia Postprocedure Evaluation (Signed)
Anesthesia Post Note  Patient: Steven Cortez  Procedure(s) Performed: EXAM UNDER ANESTHESIA; CYSTOSCOPY WITH BILATERAL RETROGRADE PYELOGRAM (Bilateral Bladder) BIOPSY OF BLADDER AND FULGURATION AND EXCHANGE OF FOLEY (N/A Bladder)     Patient location during evaluation: PACU Anesthesia Type: General Level of consciousness: awake and alert Pain management: pain level controlled Vital Signs Assessment: post-procedure vital signs reviewed and stable Respiratory status: spontaneous breathing, nonlabored ventilation, respiratory function stable and patient connected to nasal cannula oxygen Cardiovascular status: blood pressure returned to baseline and stable Postop Assessment: no apparent nausea or vomiting Anesthetic complications: no   No complications documented.              Shelton Silvas

## 2020-06-24 NOTE — Progress Notes (Signed)
Patient ID: Steven Cortez, male   DOB: 1975-08-03, 45 y.o.   MRN: [id number] S: No new complaints O:BP 137/83 (BP Location: Right Arm)   Pulse 79   Temp 98.7 F (37.1 C) (Oral)   Resp 16   Ht 5\' 10"  (1.778 m)   Wt 97.1 kg   SpO2 98%   BMI 30.72 kg/m   Intake/Output Summary (Last 24 hours) at 06/24/2020 1212 Last data filed at 06/24/2020 1156 Gross per 24 hour  Intake 477 ml  Output 4475 ml  Net -3998 ml   Intake/Output: I/O last 3 completed shifts: In: 1297 [P.O.:1297] Out: 4525 [Urine:4525]  Intake/Output this shift:  Total I/O In: -  Out: 1300 [Urine:1300] Weight change: 4.2 kg Gen: NAD CVS: RRR Resp: cta Abd: +BS, soft,NT/ND Ext: trace BLE edema  Recent Labs  Lab 06/18/20 0500 06/19/20 0512 06/20/20 0435 06/21/20 0437 06/22/20 0551 06/23/20 0450 06/24/20 0520  NA 139 140 138 138 140 138 136  K 3.7 3.4* 3.3* 3.3* 3.3* 3.3* 3.4*  CL 105 108 106 104 105 102 102  CO2 23 23 22 24 23 23 24   GLUCOSE 87 103* 94 100* 126* 124* 97  BUN 58* 54* 52* 53* 52* 50* 49*  CREATININE 3.18* 2.99* 3.04* 3.06* 3.16* 3.14* 3.07*  ALBUMIN 1.6* 1.5* 1.5* 1.6* 1.7* 1.6* 1.7*  CALCIUM 7.0* 7.0* 7.2* 7.6* 7.8* 7.6* 7.8*  PHOS 5.5* 5.3* 5.5* 6.1* 6.3* 6.1* 6.1*  AST  --   --   --   --   --   --  19  ALT  --   --   --   --   --   --  27   Liver Function Tests: Recent Labs  Lab 06/22/20 0551 06/23/20 0450 06/24/20 0520  AST  --   --  19  ALT  --   --  27  ALKPHOS  --   --  163*  BILITOT  --   --  0.9  PROT  --   --  5.9*  ALBUMIN 1.7* 1.6* 1.7*   No results for input(s): LIPASE, AMYLASE in the last 168 hours. No results for input(s): AMMONIA in the last 168 hours. CBC: Recent Labs  Lab 06/20/20 0435 06/21/20 0437 06/22/20 0551 06/23/20 0612 06/23/20 0700 06/24/20 0520  WBC 11.2* 11.3* 9.4 9.2  --  8.1  HGB 7.9* 8.7* 10.1* 7.9* 8.2* 7.7*  HCT 24.0* 26.3* 32.4* 25.4* 24.9* 24.1*  MCV 86.6 85.9 87.6 87.6  --  86.7  PLT 197 214 229 215  --  223   Cardiac  Enzymes: Recent Labs  Lab 06/22/20 0551  CKTOTAL 23*   CBG: No results for input(s): GLUCAP in the last 168 hours.  Iron Studies: No results for input(s): IRON, TIBC, TRANSFERRIN, FERRITIN in the last 72 hours. Studies/Results: No results found. 08/24/20 amLODipine  10 mg Oral Daily  . calcium carbonate  1 tablet Oral TID WC  . carvedilol  25 mg Oral BID WC  . Chlorhexidine Gluconate Cloth  6 each Topical Daily  . enoxaparin (LOVENOX) injection  40 mg Subcutaneous Daily  . feeding supplement  237 mL Oral BID BM  . ferrous sulfate  325 mg Oral Q breakfast  . HYDROmorphone   Intravenous Q4H  . methocarbamol  750 mg Oral TID AC & HS  . multivitamin with minerals  1 tablet Oral Daily  . polyethylene glycol  17 g Oral BID  . senna-docusate  2 tablet Oral BID  .  sodium chloride flush  10-40 mL Intracatheter Q12H    BMET    Component Value Date/Time   NA 136 06/24/2020 0520   K 3.4 (L) 06/24/2020 0520   CL 102 06/24/2020 0520   CO2 24 06/24/2020 0520   GLUCOSE 97 06/24/2020 0520   BUN 49 (H) 06/24/2020 0520   CREATININE 3.07 (H) 06/24/2020 0520   CALCIUM 7.8 (L) 06/24/2020 0520   GFRNONAA 25 (L) 06/24/2020 0520   CBC    Component Value Date/Time   WBC 8.1 06/24/2020 0520   RBC 2.78 (L) 06/24/2020 0520   HGB 7.7 (L) 06/24/2020 0520   HCT 24.1 (L) 06/24/2020 0520   PLT 223 06/24/2020 0520   MCV 86.7 06/24/2020 0520   MCH 27.7 06/24/2020 0520   MCHC 32.0 06/24/2020 0520   RDW 15.9 (H) 06/24/2020 0520   LYMPHSABS 1.8 06/12/2020 0718   MONOABS 1.1 (H) 06/12/2020 0718   EOSABS 0.0 06/12/2020 0718   BASOSABS 0.0 06/12/2020 0718     Summary:45 y.o.malewith a past medical history significant for IVDU, HTN, hepatitis C, admitted for tricuspid MRSA endocarditis with bacteremia and septic embolization as well as kidney injury.   Assessment/Plan:  1. AKI-Non-Oliguric AKI: ATN requiring dialysis early in the hospitalization now no longer requiring dialysis. Creatinine  improved to 1.5 but steadily rising creatinine for several days. Also now with significant proteinuria 13 g. Also with bilateral hydronephrosis on renal ultrasound. Possible intermittent urinary retention related to infectious issues of the spine. Concern for recent rising creatinine possibly related to hydronephrosis but periinfectious GN also possible. Note normal complement. ANA and ANCA negative. Scr stabilizing at 3-3.2 1. Continue foley catheter 2. Off IVF's due to edema 3. Avoid ACE/ARB due to AKI 4. Would not perform renal biopsy given risk and h/o IVDU. Doubt it would change treatment plans at this time. 5. Nothing further to add and will sign off.  Please call with questions/concerns. 6. He will require outpatient follow up with our practice after discharge. 2. Bilateral hydronephrosis - s/p cystoscopy, gross hematuria, malpositioned foley, moderate right and mild left UPJ obstruction, incomplete bladder emptying. Balloon was in urethra on cysto and was placed in bladder with good drainage. Possible neurogenic bladder so will keep foley in place. Urology following.  3. 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 4. HTN/volume- stable BP but has significant edema. Now with better UOP s/p replacement of foley catheter.     Irena Cords, MD [employer] [phone]

## 2020-06-24 NOTE — Progress Notes (Signed)
Physical Therapy Treatment Patient Details Name: Steven Cortez MRN: [id number] DOB: [birth year] Today's Date: 06/24/2020    History of Present Illness Pt is a 45 year old male with PMHx including Hep C and IVDU who presented with back pain, weakness, dyspnea, pleuritic chest pain, urinary retention, and pitting edema. He was recently admitted to [facility] and found to have tricuspid endocarditis with MRSA bacteremia, and pulmonary septic emboli but left AMA. 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 s/pTEE 05/28/20.   2/11 repeat evacuation / extension of decompression    PT Comments    Pt demonstrating some progress towards his physical therapy goals today. Premedicated with IV pain medication prior to session and encouraged PCA use during session. Pt requiring min assist for transfers; ambulating x 5 ft with a walker at a min guard assist level. He remains very motivated, and I'm hopeful with continued pain management he can progress distance and d/c plan can be updated.    Follow Up Recommendations  SNF     Equipment Recommendations  Rolling walker with 5" wheels;3in1 (PT);Wheelchair (measurements PT);Wheelchair cushion (measurements PT)    Recommendations for Other Services       Precautions / Restrictions Precautions Precautions: Fall;Back;Other (comment) Precaution Booklet Issued: Yes (comment) Precaution Comments: PCA Restrictions Weight Bearing Restrictions: No    Mobility  Bed Mobility Overal bed mobility: Needs Assistance Bed Mobility: Rolling;Sidelying to Sit;Sit to Sidelying Rolling: Modified independent (Device/Increase time) Sidelying to sit: Supervision     Sit to sidelying: Min assist General bed mobility comments: No physical assist for progression to edge of bed. MinA for LE management back into bed     Transfers Overall transfer level: Needs assistance Equipment used: Rolling walker (2 wheeled) Transfers: Sit to/from Stand Sit to Stand: Min assist         General transfer comment: MinA to rise to stand from edge of bed  Ambulation/Gait Ambulation/Gait assistance: Min guard Gait Distance (Feet): 5 Feet Assistive device: Rolling walker (2 wheeled) Gait Pattern/deviations: Step-through pattern;Decreased stride length Gait velocity: decreased Gait velocity interpretation: <1.8 ft/sec, indicate of risk for recurrent falls General Gait Details: Decreased bilateral foot clearance, heavy use of arms, min guard for safety   Stairs             Wheelchair Mobility    Modified Aamilah Augenstein (Stroke Patients Only)       Balance Overall balance assessment: Needs assistance Sitting-balance support: Feet supported;Bilateral upper extremity supported Sitting balance-Leahy Scale: Fair     Standing balance support: Bilateral upper extremity supported Standing balance-Leahy Scale: Poor                              Cognition Arousal/Alertness: Awake/alert Behavior During Therapy: WFL for tasks assessed/performed Overall Cognitive Status: Within Functional Limits for tasks assessed                                        Exercises      General Comments        Pertinent Vitals/Pain Pain Assessment: Faces Faces Pain Scale: Hurts whole lot Pain Location: back Pain Descriptors / Indicators: Guarding;Grimacing;Spasm;Sharp;Discomfort;Sore Pain Intervention(s): Limited activity within patient's tolerance;Monitored during session;Premedicated before session;PCA encouraged    Home Living  Prior Function            PT Goals (current goals can now be found in the care plan section) Acute Rehab PT Goals Patient Stated Goal: to walk Potential to Achieve Goals: Good Progress towards PT goals: Progressing toward goals     Frequency    Min 3X/week      PT Plan Current plan remains appropriate    Co-evaluation              AM-PAC PT "6 Clicks" Mobility   Outcome Measure  Help needed turning from your back to your side while in a flat bed without using bedrails?: None Help needed moving from lying on your back to sitting on the side of a flat bed without using bedrails?: None Help needed moving to and from a bed to a chair (including a wheelchair)?: A Little Help needed standing up from a chair using your arms (e.g., wheelchair or bedside chair)?: A Little Help needed to walk in hospital room?: A Little Help needed climbing 3-5 steps with a railing? : A Lot 6 Click Score: 19    End of Session   Activity Tolerance: Patient limited by pain Patient left: in bed;with call bell/phone within reach Nurse Communication: Mobility status PT Visit Diagnosis: Unsteadiness on feet (R26.81);Pain     Time: 4656-8127 PT Time Calculation (min) (ACUTE ONLY): 26 min  Charges:  $Therapeutic Activity: 23-37 mins                     Lillia Pauls, PT, DPT Acute Rehabilitation Services Pager [phone] Office [phone]    Norval Morton 06/24/2020, 2:52 PM

## 2020-06-24 NOTE — Progress Notes (Signed)
TOC will continue to follow for discharge needs. Pt currently not able to go to SNF with IV Abx/PIC  Due to history of IVDU. Pt has been referred to financial navigators to apply for medicaid.

## 2020-06-24 NOTE — Progress Notes (Signed)
Nutrition Follow-up  DOCUMENTATION CODES:   Non-severe (moderate) malnutrition in context of chronic illness  INTERVENTION:   - Recommend liberalizing diet to 2 gram sodium with 1200 ml fluid restriction as renal diet significantly restricts food choices and pt is malnourished  - Continue Ensure Enlive po BID, each supplement provides 350 kcal and 20 grams of protein  - Continue Magic Cup TID with meals, each supplement provides 290 kcal and 9 grams of protein  - Continue MVI with minerals daily  - Add double protein portions ordered TID with meals  - Diet education provided  NUTRITION DIAGNOSIS:   Moderate Malnutrition related to chronic illness (IV drug abuse) as evidenced by energy intake < or equal to 75% for > or equal to 1 month,mild muscle depletion,mild fat depletion.  Ongoing, being addressed via supplements  GOAL:   Patient will meet greater than or equal to 90% of their needs  Progressing  MONITOR:   PO intake,Supplement acceptance,Labs,Weight trends,Skin,I & O's  REASON FOR ASSESSMENT:   LOS    ASSESSMENT:   Steven Cortez is a 45 y.o. male with hx of IV drug use and untreated Hep C who was diagnosed with endocarditis and MRSA bacteremia at [facility], left AMA then presented to [facility] a few days later. Also found to have large epidural abscess, s/p laminectomy and evacuation by neurosurgery on 1/29. Symptoms and leukocytosis are improving with antibiotics.  1/30 - first HD treatment 2/01 - second HD treatment 2/03 - s/p TEE revealing large tricuspid valve vegetation, moderate to severe TR, and possible tiny PFO 2/04 - HD cath removed 2/11- s/prepeat L4-5, L5-S1 MIS laminectomies, evacuation of epiduralphlegmon 2/27 - s/p cystoscopy with bilateral retrograde pyelogram, bladder biopsy and fulguration, Foley catheter exchange  RD consulted for assessment, wound healing, and diet education. Initial Nutrition Assessment completed on 06/02/20 by  Cathie Hoops, RD.  Noted Nephrology has signed off. Recommend liberalizing diet to 2 gram sodium with 1200 ml fluid restriction as renal diet is extremely restrictive and pt is malnourished.  Weight trending up since admission. Admit weight was 83.5 kg and current weight is 97.1 kg. Pt continues to have +1 pitting edema to BLE.  Spoke with pt at bedside. Pt requesting Ensure at time of visit so RD provided him with one. Pt reports drinking an Ensure supplement this morning and finished 100%. RD discussed with pt the importance of adequate kcal and protein intake in preserving lean muscle mass, improving strength and stamina, and preventing dry weight loss.  Pt states that he is eating well at meals and would eat more he received more food. RD will order double protein portions with meals.  Meal Completion: 75-100%  Medications reviewed and include: tums TID with meals, Ensure Enlive BID, ferrous sulfate, MVI with minerals, miralax, senna, IV abx  Labs reviewed: potassium 3.4, BUN 49, creatinine 2.07, phosphorus 6.1, hemoglobin 7.  UOP: 3175 ml x 24 hours  Diet Order:   Diet Order            Diet renal with fluid restriction Fluid restriction: 1200 mL Fluid; Room service appropriate? Yes; Fluid consistency: Thin  Diet effective now                 EDUCATION NEEDS:   Education needs have been addressed  Skin:  Skin Assessment: Skin Integrity Issues: Incisions: closed back  Last BM:  06/24/20  Height:   Ht Readings from Last 1 Encounters:  06/05/20 5\' 10"  (1.778 m)  Weight:   Wt Readings from Last 1 Encounters:  06/24/20 97.1 kg    Ideal Body Weight:  75.5 kg  BMI:  Body mass index is 30.72 kg/m.  Estimated Nutritional Needs:   Kcal:  2300-2500  Protein:  135-155 grams  Fluid:  > 2 L    Mertie Clause, MS, RD, LDN Inpatient Clinical Dietitian Please see [facility] for contact information.

## 2020-06-24 NOTE — Progress Notes (Addendum)
Subjective:   Mr. Steven Cortez states he continues to feel better. He did not wake up as frequently overnight and required less Dilaudid via PCA pump. He continues to have mild right flank pain and hematuria as well as intermittent shooting pain down his bilateral legs with certain movements. Notes increased swelling in his legs overnight. Did not drink Ensure overnight, although states he doesn't mind the taste of it. Denies CP, palpitations, light-headedness, dizziness, fevers, chills.   Objective:  Vital signs in last 24 hours: Vitals:   06/24/20 0036 06/24/20 0037 06/24/20 0326 06/24/20 0415  BP:   (!) 147/89   Pulse:   78   Resp: 16 16 18 14   Temp:   98.4 F (36.9 C)   TempSrc:   Oral   SpO2: 100% 100% 97% 97%  Weight:   97.1 kg   Height:        Filed Weights   06/22/20 0029 06/23/20 0017 06/24/20 0326  Weight: 98.6 kg 92.9 kg 97.1 kg     Intake/Output Summary (Last 24 hours) at 06/24/2020 0551 Last data filed at 06/24/2020 0328 Gross per 24 hour  Intake 817 ml  Output 3175 ml  Net -2358 ml   Net IO Since Admission: -33,512.52 mL [06/24/20 0551]  No results for input(s): GLUCAP in the last 72 hours.   Pertinent Labs: CBC Latest Ref Rng & Units 06/23/2020 06/23/2020 06/22/2020  WBC 4.0 - 10.5 K/uL - 9.2 9.4  Hemoglobin 13.0 - 17.0 g/dL 8.2(L) 7.9(L) 10.1(L)  Hematocrit 39.0 - 52.0 % 24.9(L) 25.4(L) 32.4(L)  Platelets 150 - 400 K/uL - 215 229    CMP Latest Ref Rng & Units 06/23/2020 06/22/2020 06/21/2020  Glucose 70 - 99 mg/dL 06/23/2020) 161(W) 960(A)  BUN 6 - 20 mg/dL 540(J) 81(X) 91(Y)  Creatinine 0.61 - 1.24 mg/dL 78(G) 9.56(O) 1.30(Q)  Sodium 135 - 145 mmol/L 138 140 138  Potassium 3.5 - 5.1 mmol/L 3.3(L) 3.3(L) 3.3(L)  Chloride 98 - 111 mmol/L 102 105 104  CO2 22 - 32 mmol/L 23 23 24   Calcium 8.9 - 10.3 mg/dL 7.6(L) 7.8(L) 7.6(L)  Total Protein 6.5 - 8.1 g/dL - - -  Total Bilirubin 0.3 - 1.2 mg/dL - - -  Alkaline Phos 38 - 126 U/L - - -  AST 15 - 41 U/L - - -  ALT 0  - 44 U/L - - -    Imaging: No results found. Physical Exam  General: Patient appears chronically ill. No acute distress. Eyes: Sclera non-icteric. No conjunctival injection.  Respiratory: Lungs CTA, bilaterally. No tachypnea or increased work of breathing on 2L [place].  Cardiovascular: Telemetry showed bigeminy during examination. There is a 2/6 early systolic, harsh murmur along the lower left sternal border. No other murmurs, rubs or gallops. There is increase in bilateral lower extremity edema ~ 1+ bilaterally up to knees.  Abdominal: Soft and non-tender to palpation. No rebound or guarding.  GU: No CVA tenderness to palpation. Psych: Normal affect. Normal tone of voice.   Assessment/Plan: Steven Cortez is a 45 y.o. 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 Hospital course complicated by acute renal failure requiring HD and large epidural abscess s/p laminectomy x2 (1/29 and 2/11). Now on long course IV abx treatment.   Principal Problem:   Endocarditis Active Problems:   Urinary retention   Acute renal failure (HCC)   Hyperkalemia   Microcytic anemia  MRSA infection   Epidural abscess   Opioid use disorder   Hydronephrosis   Malnutrition of moderate degree   Antibiotic long-term use   Chronic viral hepatitis B without delta agent and without coma (HCC)   Fever   Anemia of chronic disease  MRSA TV Endocarditis w/ Bacteremia Complicated by Septic Emboli and Epidural Abscess s/p I&D w/ laminectomy x 2 Patient continues to remain afebrile and HDS on IV Daptomycin.  --Continue daptomycin per ID --PRN Tylenol for fevers --Will try to wean down PCA pump --Continue IV dilaudid during PT/OT sessions for pain --Robaxin 750mg  three times daily  Ventricular Bigeminy Patient noted 06/17/20 to have frequent bigeminy which initially improved with Coreg, although now occurs more frequently; however,  ECHO shows normal systolic function, patient remains asymptomatic and HDS.  --Continue Coreg 25mg  twice dialy   Acute Kidney Injury on CKD  Nephrotic Range Proteinuria  Bilateral Hydronephrosis  Hematuria  Creatinine slightly improved since yesterday but up from ~1.5 this admission. 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 Cystoscopy performed which showed erythema of the posterior superior part of the bladder neck, although biopsy returned benign, with chronic inflammation, vascular ectasia, and stromal hemorrhage. Patient is clinically hypervolemic, although continues to make good amount of urine. --Appreciate nephrology and urology recs  --Continue to avoid fluid hydration as able  --Continue to trend daily BMP  --Avoid nephrotoxic agents  --Continue TED hose and LE elevation  Hypokalemia Potassium remains low despite replacement, likely due to increased urine volumes.  --Will be more aggressive in replacement given persistent Bigeminy  --Will consult RD for nutritional assessment --Continue to monitor daily  Normocytic Anemia, Stable S/p 7 u of pRBC transfusion. Hemoglobin down-trending slightly although relatively stable at 7.7. Likely due to persistent hematuria in setting of stromal hemorrhage. --Continue Lovenox for DVT prophylaxis unless requiring transfusions --Continue close monitoring   Hypertension, Stable --Continue Coreg 25 mg twice daily --Continue amlodipine 10 mg daily  Diet: Renal diet IVF: None VTE: Lovenox CODE: Full Code  Prior to Admission Living Arrangement: Home Anticipated Discharge Location: SNF  Barriers to Discharge: Medical work up  Signed: 06/19/20, MD 06/24/2020, 5:51 AM  Pager: [phone] Internal Medicine Teaching Service After 5pm on weekdays and 1pm on weekends: On Call pager: [phone]

## 2020-06-25 LAB — CBC
HCT: 25.3 % — ABNORMAL LOW (ref 39.0–52.0)
Hemoglobin: 8.1 g/dL — ABNORMAL LOW (ref 13.0–17.0)
MCH: 27.9 pg (ref 26.0–34.0)
MCHC: 32 g/dL (ref 30.0–36.0)
MCV: 87.2 fL (ref 80.0–100.0)
Platelets: 220 10*3/uL (ref 150–400)
RBC: 2.9 MIL/uL — ABNORMAL LOW (ref 4.22–5.81)
RDW: 15.7 % — ABNORMAL HIGH (ref 11.5–15.5)
WBC: 8.6 10*3/uL (ref 4.0–10.5)
nRBC: 0 % (ref 0.0–0.2)

## 2020-06-25 LAB — RENAL FUNCTION PANEL
Albumin: 1.7 g/dL — ABNORMAL LOW (ref 3.5–5.0)
Anion gap: 10 (ref 5–15)
BUN: 50 mg/dL — ABNORMAL HIGH (ref 6–20)
CO2: 25 mmol/L (ref 22–32)
Calcium: 7.8 mg/dL — ABNORMAL LOW (ref 8.9–10.3)
Chloride: 101 mmol/L (ref 98–111)
Creatinine, Ser: 2.94 mg/dL — ABNORMAL HIGH (ref 0.61–1.24)
GFR, Estimated: 26 mL/min — ABNORMAL LOW (ref >60)
Glucose, Bld: 96 mg/dL (ref 70–99)
Phosphorus: 6.2 mg/dL — ABNORMAL HIGH (ref 2.5–4.6)
Potassium: 3.7 mmol/L (ref 3.5–5.1)
Sodium: 136 mmol/L (ref 135–145)

## 2020-06-25 LAB — SEDIMENTATION RATE: Sed Rate: 117 mm/hr — ABNORMAL HIGH (ref 0–16)

## 2020-06-25 LAB — C-REACTIVE PROTEIN: CRP: 10.3 mg/dL — ABNORMAL HIGH (ref <1.0)

## 2020-06-25 LAB — MAGNESIUM: Magnesium: 2 mg/dL (ref 1.7–2.4)

## 2020-06-25 MED ORDER — SODIUM CHLORIDE 0.9 % IV SOLN
770.0000 mg | Freq: Every day | INTRAVENOUS | Status: DC
  Administered 2020-06-25 – 2020-07-13 (×20): 770 mg via INTRAVENOUS
  Filled 2020-06-25 (×22): qty 15.4

## 2020-06-25 NOTE — Progress Notes (Signed)
Subjective:   Mr. Davoli continues to feel well today. He did require PCA use not documented in chart but only when attempting to sit up or move. He continues to be highly motivated to work with PT and notes back pain improves with ambulation. Endorses flank pain when sitting up although denies troubles with his foley or abdominal pain. Denies fevers, chills, constipation, CP (improved), SOB, light-headedness, dizziness. States his LE swelling feels about the same as yesterday and denies any leg pain.   Denies being on any home medications. Updated him that his blood work shows improvement and that we will be changing his diet to improve his protein intake. Encouraged him to drink ensures and take TUMS.  Objective:  Vital signs in last 24 hours: Vitals:   06/25/20 0751 06/25/20 0835 06/25/20 1211 06/25/20 1223  BP: (!) 143/98   128/65  Pulse: 73   78  Resp: 17 16 20 16   Temp: 98.3 F (36.8 C)   98.5 F (36.9 C)  TempSrc: Oral   Oral  SpO2: 98% 98% 98% 98%  Weight:      Height:        Filed Weights   06/23/20 0017 06/24/20 0326 06/25/20 0033  Weight: 92.9 kg 97.1 kg 97 kg     Intake/Output Summary (Last 24 hours) at 06/25/2020 1616 Last data filed at 06/25/2020 1200 Gross per 24 hour  Intake 1177 ml  Output 3125 ml  Net -1948 ml   Net IO Since Admission: -36,310.52 mL [06/25/20 1616]  No results for input(s): GLUCAP in the last 72 hours.   Pertinent Labs: CBC Latest Ref Rng & Units 06/25/2020 06/24/2020 06/23/2020  WBC 4.0 - 10.5 K/uL 8.6 8.1 -  Hemoglobin 13.0 - 17.0 g/dL 8.1(L) 7.7(L) 8.2(L)  Hematocrit 39.0 - 52.0 % 25.3(L) 24.1(L) 24.9(L)  Platelets 150 - 400 K/uL 220 223 -    CMP Latest Ref Rng & Units 06/25/2020 06/24/2020 06/23/2020  Glucose 70 - 99 mg/dL 96 97 124(H)  BUN 6 - 20 mg/dL 50(H) 49(H) 50(H)  Creatinine 0.61 - 1.24 mg/dL 2.94(H) 3.07(H) 3.14(H)  Sodium 135 - 145 mmol/L 136 136 138  Potassium 3.5 - 5.1 mmol/L 3.7 3.4(L) 3.3(L)  Chloride 98 - 111 mmol/L 101  102 102  CO2 22 - 32 mmol/L 25 24 23   Calcium 8.9 - 10.3 mg/dL 7.8(L) 7.8(L) 7.6(L)  Total Protein 6.5 - 8.1 g/dL - 5.9(L) -  Total Bilirubin 0.3 - 1.2 mg/dL - 0.9 -  Alkaline Phos 38 - 126 U/L - 163(H) -  AST 15 - 41 U/L - 19 -  ALT 0 - 44 U/L - 27 -    Imaging: No results found. Physical Exam  General: Patient appears chronically ill. No acute distress. Eyes: Sclera non-icteric. No conjunctival injection.  Respiratory: Lungs CTA, bilaterally. No tachypnea or increased work of breathing. Tolerated weaning down oxygen well. Cardiovascular: Telemetry shows NSR. There is a 2/6 early systolic, harsh murmur along the lower left sternal border. There is L > R LE pitting edema, about 1+ to the knee with good distal perfusion. Musculoskeletal: No LE tenderness to palpation.  Abdominal: Soft and non-tender to palpation. No rebound or guarding. Bowel sounds intact. GU: No CVA tenderness to palpation.  Assessment/Plan: Steven Cortez is a 45 y.o. 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 Hospital course complicated by acute renal failure requiring HD and large epidural abscess  s/p laminectomy x2 (1/29 and 2/11). Now on long course IV abx treatment.   Principal Problem:   Endocarditis Active Problems:   Urinary retention   Acute renal failure (HCC)   Hyperkalemia   Microcytic anemia   MRSA infection   Epidural abscess   Opioid use disorder   Hydronephrosis   Malnutrition of moderate degree   Antibiotic long-term use   Chronic viral hepatitis B without delta agent and without coma (HCC)   Fever   Anemia of chronic disease  MRSA TV Endocarditis w/ Bacteremia Complicated by Septic Emboli and Epidural Abscess s/p I&D w/ laminectomy x 2 Patient continues to remain afebrile and HDS on IV Daptomycin.  --Continue daptomycin per ID with weekly ESR/CRP and CK --Per SW unable to go to SNF with PICC /IV in  setting of IVDU; working on financial situation --PRN Tylenol for fevers --Will try to wean down PCA pump --Continue IV dilaudid during PT/OT sessions for pain --Robaxin 771m three times daily  Ventricular Bigeminy Patient noted 06/17/20 to have frequent bigeminy although is now in NSR. ECHO shows normal systolic function, patient remains asymptomatic and HDS.  --Continue Coreg 245mtwice dialy  --Start other antiarrhythmic after discussion with cardiology only if newly symptomatic with episodes  Acute Kidney Injury on CKD, Improving Nephrotic Range Proteinuria  Bilateral Hydronephrosis  Hematuria  Creatinine continues to improve slowly although remains up from ~1.5 this admission. Likely due to glomerulonephritis vs. IgA nephropathy. Cystoscopy performed which showed erythema of the posterior superior part of the bladder neck, although biopsy returned benign, with chronic inflammation, vascular ectasia, and stromal hemorrhage. Patient is clinically hypervolemic in low albumin state although continues to make good amount of urine. Persistent hematuria although with stable hemoglobin. --Nephrology has signed off with plan for outpatient follow up --RD on board; will liberate diet from renal to 2g sodium and 1.2L fluid restriction --Will require close phosphorus monitoring; encouraged TUMS intake although may need phosphate binder if continues to elevate --Continue to avoid fluid hydration as able  --Continue to trend daily RFP --Avoid nephrotoxic agents  --Continue TED hose and LE elevation  Hypokalemia, Resolved Potassium improving with improvement in renal function.  --Goal > 4.0 given bigeminy although will hold off on replacement in setting of goal for improved PO intake (in setting of AKI)  --Continue to monitor daily  Normocytic Anemia, Stable S/p 7 u of pRBC transfusion. Hemoglobin stable at 8.1. Likely due to persistent hematuria in setting of bladder stromal hemorrhage/possible  GN. --Continue Lovenox for DVT prophylaxis unless requiring transfusions --Continue close monitoring   Hypertension, Stable --Continue Coreg 25 mg twice daily --Continue amlodipine 10 mg daily  Diet: 2 Gram Sodium / 1.2L fluid diet IVF: None VTE: Lovenox CODE: Full Code  Prior to Admission Living Arrangement: Home Anticipated Discharge Location: SNF vs. Home  Barriers to Discharge: IV ABX  Signed: SpJeralyn BennettMD 06/25/2020, 4:16 PM  Pager: [phone] Medicine Teaching Service After 5pm on weekdays and 1pm on weekends: On Call pager: [phone]

## 2020-06-25 NOTE — Progress Notes (Signed)
Pharmacy Antibiotic Note  Steven Cortez is a 45 y.o. male who uses IV drugs admitted on 05/21/2020 with MRSA bacteremia. Patient seen by ID and is being treated with 8 weeks of daptomycin for disseminated MRSA infection  Patients weight has increased significantly since being in the hospital. Will adjust his dose to continue with 8mg /kg TBW, 770mg  daily. Will keep the same duration.   Plan: Increase Daptomycin to 770mg  IV daily  Height: 5\' 10"  (177.8 cm) Weight: 97 kg (213 lb 13.5 oz) IBW/kg (Calculated) : 73  Temp (24hrs), Avg:98.4 F (36.9 C), Min:98 F (36.7 C), Max:98.7 F (37.1 C)  Recent Labs  Lab 06/21/20 0437 06/22/20 0551 06/23/20 0450 06/23/20 0612 06/24/20 0520 06/25/20 0355  WBC 11.3* 9.4  --  9.2 8.1 8.6  CREATININE 3.06* 3.16* 3.14*  --  3.07* 2.94*    Estimated Creatinine Clearance: 37.5 mL/min (A) (by C-G formula based on SCr of 2.94 mg/dL (H)).    No Known Allergies  Antimicrobials this admission: Cefepime 1/27 x1 Linezolid 1/27>>1/28 Daptomycin 1/28>>    Microbiology results: 08/25/20 1/18 Bcx MRSA 1/21 Bcx MRSA 1/24 Bcx MRSA 1/24 Sputum MRSA    Thank you for allowing pharmacy to be a part of this patient's care.  2/18, PharmD, BCIDP Infectious Disease Pharmacist  Phone: [phone]  06/25/2020 11:28 AM

## 2020-06-25 NOTE — Plan of Care (Signed)

## 2020-06-25 NOTE — Progress Notes (Signed)
Occupational Therapy Treatment Patient Details Name: Steven Cortez MRN: [id number] DOB: October 05, 1975 Today's Date: 06/25/2020    History of present illness Pt is a 45 year old male with PMHx including Hep C and IVDU who presented with back pain, weakness, dyspnea, pleuritic chest pain, urinary retention, and pitting edema. He was recently admitted to [facility] and found to have tricuspid endocarditis with MRSA bacteremia, and pulmonary septic emboli but left AMA. 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 s/pTEE 05/28/20.   2/11 repeat evacuation / extension of decompression   OT comments  Pt progressing towards acute OT goals. Eager to work with therapy. Timed OT session with pain meds. Pt able to take pivotal steps to sit up in the recliner for a bit. Boosted seat height with pillow. D/c plan remains appropriate.   Follow Up Recommendations  Supervision/Assistance - 24 hour;SNF    Equipment Recommendations  3 in 1 bedside commode    Recommendations for Other Services      Precautions / Restrictions Precautions Precautions: Fall;Back;Other (comment) Precaution Comments: PCA Restrictions Weight Bearing Restrictions: No Other Position/Activity Restrictions: PCA       Mobility Bed Mobility Overal bed mobility: Needs Assistance Bed Mobility: Rolling;Sidelying to Sit Rolling: Modified independent (Device/Increase time) Sidelying to sit: Min assist       General bed mobility comments: assist to fully powerup trunk to EOB position    Transfers Overall transfer level: Needs assistance Equipment used: Rolling walker (2 wheeled) Transfers: Sit to/from Stand Sit to Stand: Min assist         General transfer comment: min A for light powerup assist and to stabilize rw as pt prefers to pull up with both hands on walker    Balance Overall  balance assessment: Needs assistance Sitting-balance support: Feet supported;Bilateral upper extremity supported Sitting balance-Leahy Scale: Fair Sitting balance - Comments: reliant on BUE support for static sitting 2/2 pain   Standing balance support: Bilateral upper extremity supported Standing balance-Leahy Scale: Poor Standing balance comment: Heavy use of RW                           ADL either performed or assessed with clinical judgement   ADL Overall ADL's : Needs assistance/impaired                         Toilet Transfer: Minimal assistance;RW Toilet Transfer Details (indicate cue type and reason): simulated with pivotal steps to sit up in recliner           General ADL Comments: Pt completed bed mobility and pivotal steps to sit up in recliner for a bit     Vision       Perception     Praxis      Cognition Arousal/Alertness: Awake/alert Behavior During Therapy: WFL for tasks assessed/performed Overall Cognitive Status: Within Functional Limits for tasks assessed                                 General Comments: Internally distracted by pain.        Exercises     Shoulder Instructions       General Comments      Pertinent Vitals/ Pain       Pain Assessment: Faces Faces  Pain Scale: Hurts whole lot Pain Location: back Pain Descriptors / Indicators: Guarding;Grimacing;Spasm;Sharp;Discomfort;Sore Pain Intervention(s): Premedicated before session;Repositioned;Limited activity within patient's tolerance;Monitored during session  Home Living                                          Prior Functioning/Environment              Frequency  Min 2X/week        Progress Toward Goals  OT Goals(current goals can now be found in the care plan section)  Progress towards OT goals: Progressing toward goals  Acute Rehab OT Goals Patient Stated Goal: to walk OT Goal Formulation: With  patient Time For Goal Achievement: 07/09/20 Potential to Achieve Goals: Good ADL Goals Pt Will Perform Grooming: sitting;with supervision Pt Will Perform Lower Body Bathing: sit to/from stand;with adaptive equipment;with min guard assist Pt Will Perform Upper Body Dressing: with modified independence;sitting Pt Will Perform Lower Body Dressing: sit to/from stand;with adaptive equipment;with min guard assist Pt Will Transfer to Toilet: with modified independence;ambulating;with min guard assist Pt Will Perform Toileting - Clothing Manipulation and hygiene: with modified independence;sit to/from stand;with min guard assist Additional ADL Goal #1: Pt will complete bed mobility at min guard level to prepare for EOB ADLs  Plan Discharge plan remains appropriate;Frequency remains appropriate    Co-evaluation                 AM-PAC OT "6 Clicks" Daily Activity     Outcome Measure   Help from another person eating meals?: None Help from another person taking care of personal grooming?: A Little Help from another person toileting, which includes using toliet, bedpan, or urinal?: A Lot Help from another person bathing (including washing, rinsing, drying)?: A Lot Help from another person to put on and taking off regular upper body clothing?: A Lot Help from another person to put on and taking off regular lower body clothing?: A Lot 6 Click Score: 15    End of Session Equipment Utilized During Treatment: Gait belt;Rolling walker;Oxygen;Other (comment) (1L)  OT Visit Diagnosis: Other abnormalities of gait and mobility (R26.89);Muscle weakness (generalized) (M62.81);Pain   Activity Tolerance Patient tolerated treatment well;Patient limited by pain   Patient Left in chair;with call bell/phone within reach   Nurse Communication Other (comment) (coordinated pain meds with therapy. Pt up in recliner at end of session)        Time: 4163-8453 OT Time Calculation (min): 27 min  Charges:  OT General Charges $OT Visit: 1 Visit OT Treatments $Self Care/Home Management : 23-37 mins  Raynald Kemp, OT Acute Rehabilitation Services Pager: [phone] Office: [phone]    Pilar Grammes 06/25/2020, 12:15 PM

## 2020-06-25 NOTE — Plan of Care (Signed)
  Problem: Clinical Measurements: Goal: Ability to maintain clinical measurements within normal limits will improve Outcome: Progressing Goal: Will remain free from infection Outcome: Progressing Goal: Diagnostic test results will improve Outcome: Progressing Goal: Respiratory complications will improve Outcome: Progressing Goal: Cardiovascular complication will be avoided Outcome: Progressing   Problem: Pain Managment: Goal: General experience of comfort will improve Outcome: Progressing   

## 2020-06-26 LAB — CBC
HCT: 23.3 % — ABNORMAL LOW (ref 39.0–52.0)
Hemoglobin: 7.7 g/dL — ABNORMAL LOW (ref 13.0–17.0)
MCH: 28.5 pg (ref 26.0–34.0)
MCHC: 33 g/dL (ref 30.0–36.0)
MCV: 86.3 fL (ref 80.0–100.0)
Platelets: 217 10*3/uL (ref 150–400)
RBC: 2.7 MIL/uL — ABNORMAL LOW (ref 4.22–5.81)
RDW: 15.7 % — ABNORMAL HIGH (ref 11.5–15.5)
WBC: 7.3 10*3/uL (ref 4.0–10.5)
nRBC: 0 % (ref 0.0–0.2)

## 2020-06-26 LAB — RENAL FUNCTION PANEL
Albumin: 1.7 g/dL — ABNORMAL LOW (ref 3.5–5.0)
Anion gap: 10 (ref 5–15)
BUN: 55 mg/dL — ABNORMAL HIGH (ref 6–20)
CO2: 25 mmol/L (ref 22–32)
Calcium: 7.8 mg/dL — ABNORMAL LOW (ref 8.9–10.3)
Chloride: 101 mmol/L (ref 98–111)
Creatinine, Ser: 3.08 mg/dL — ABNORMAL HIGH (ref 0.61–1.24)
GFR, Estimated: 25 mL/min — ABNORMAL LOW (ref >60)
Glucose, Bld: 96 mg/dL (ref 70–99)
Phosphorus: 5.9 mg/dL — ABNORMAL HIGH (ref 2.5–4.6)
Potassium: 3.7 mmol/L (ref 3.5–5.1)
Sodium: 136 mmol/L (ref 135–145)

## 2020-06-26 MED ORDER — FERROUS SULFATE 325 (65 FE) MG PO TABS
325.0000 mg | ORAL_TABLET | Freq: Every day | ORAL | Status: DC
Start: 2020-06-27 — End: 2020-07-15
  Administered 2020-06-27 – 2020-07-15 (×19): 325 mg via ORAL
  Filled 2020-06-26 (×20): qty 1

## 2020-06-26 MED ORDER — CARVEDILOL 25 MG PO TABS
25.0000 mg | ORAL_TABLET | Freq: Two times a day (BID) | ORAL | Status: DC
  Administered 2020-06-27 – 2020-07-15 (×37): 25 mg via ORAL
  Filled 2020-06-26 (×37): qty 1

## 2020-06-26 MED ORDER — POTASSIUM CHLORIDE CRYS ER 20 MEQ PO TBCR
20.0000 meq | EXTENDED_RELEASE_TABLET | Freq: Once | ORAL | Status: AC
  Administered 2020-06-26: 20 meq via ORAL
  Filled 2020-06-26: qty 1

## 2020-06-26 NOTE — Progress Notes (Signed)
Subjective:   Steven Cortez states that he continues to feel better. He was able to sit up in his recliner yesterday and is more comfortable, using the PCA pump less. CP has resolved. Denies SOB, cough, fever, chills, abdominal or flank pain. Does note his LE swelling feels more significant today although equal bilaterally.  Objective:  Vital signs in last 24 hours: Vitals:   06/26/20 0805 06/26/20 0807 06/26/20 1140 06/26/20 1200  BP: 129/84   123/70  Pulse: 74   72  Resp: 16 14 15 18   Temp: 98.2 F (36.8 C)   98.7 F (37.1 C)  TempSrc: Oral   Oral  SpO2: 100% 100% 98% 99%  Weight:      Height:        Filed Weights   06/24/20 0326 06/25/20 0033 06/26/20 0230  Weight: 97.1 kg 97 kg 95.6 kg     Intake/Output Summary (Last 24 hours) at 06/26/2020 1319 Last data filed at 06/26/2020 0807 Gross per 24 hour  Intake 725 ml  Output 1875 ml  Net -1150 ml   Net IO Since Admission: -37,460.52 mL [06/26/20 1319]  No results for input(s): GLUCAP in the last 72 hours.   Pertinent Labs: CBC Latest Ref Rng & Units 06/26/2020 06/25/2020 06/24/2020  WBC 4.0 - 10.5 K/uL 7.3 8.6 8.1  Hemoglobin 13.0 - 17.0 g/dL 7.7(L) 8.1(L) 7.7(L)  Hematocrit 39.0 - 52.0 % 23.3(L) 25.3(L) 24.1(L)  Platelets 150 - 400 K/uL 217 220 223    CMP Latest Ref Rng & Units 06/26/2020 06/25/2020 06/24/2020  Glucose 70 - 99 mg/dL 96 96 97  BUN 6 - 20 mg/dL 55(H) 50(H) 49(H)  Creatinine 0.61 - 1.24 mg/dL 3.08(H) 2.94(H) 3.07(H)  Sodium 135 - 145 mmol/L 136 136 136  Potassium 3.5 - 5.1 mmol/L 3.7 3.7 3.4(L)  Chloride 98 - 111 mmol/L 101 101 102  CO2 22 - 32 mmol/L 25 25 24   Calcium 8.9 - 10.3 mg/dL 7.8(L) 7.8(L) 7.8(L)  Total Protein 6.5 - 8.1 g/dL - - 5.9(L)  Total Bilirubin 0.3 - 1.2 mg/dL - - 0.9  Alkaline Phos 38 - 126 U/L - - 163(H)  AST 15 - 41 U/L - - 19  ALT 0 - 44 U/L - - 27    Imaging: No results found. Physical Exam  General: Patient appears chronically ill. No acute distress. Eyes: Sclera  non-icteric. No conjunctival injection.  Respiratory: Lungs CTA, bilaterally. No tachypnea or increased work of breathing. Saturating well on room air. Cardiovascular: Telemetry shows NSR. 3/6 murmur heard throughout the left chest, loudest in the tricuspid and mitral regions. There is 1+ pitting edema of bilateral lower extremities to the knees.  Musculoskeletal: No LE tenderness to palpation.  GU: No CVA tenderness to palpation. Urine bag with translucent red urine.  Assessment/Plan: Steven Cortez is a 45 y.o. 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 Hospital course complicated by acute renal failure requiring HD and large epidural abscess s/p laminectomy x2 (1/29 and 2/11). Now on long course IV abx treatment.   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 ECHO shows normal systolic function, patient remains asymptomatic and HDS.  --Continue Coreg 273mtwice dialy  --Start other antiarrhythmic after discussion with cardiology only if newly symptomatic  with episodes  Acute Kidney Injury on CKD, Stable Nephrotic Range Proteinuria  Bilateral Hydronephrosis  Hematuria  Creatinine stable around 3, up from ~1.5 this admission. May be new baseline for patient although hopeful for improvement. Likely due to glomerulonephritis vs. IgA nephropathy. Cystoscopy performed which showed erythema of the posterior superior part of the bladder neck, although biopsy returned benign, with chronic inflammation, vascular ectasia, and stromal hemorrhage. Patient is clinically hypervolemic in low albumin state although continues to make good amount of urine. Persistent hematuria although with stable hemoglobin. --Nephrology has signed off with plan for outpatient follow up --RD on board; diet liberated to 2g sodium and 1.2L fluid restriction --Will require close phosphorus monitoring; add binder if elevates further --Continue to avoid fluid hydration as able  --Continue to trend daily RFP --Avoid nephrotoxic agents  --Continue TED hose and LE elevation  Hypokalemia, Resolved Potassium normalized although less than target of 4.0 in setting of bigeminy.  --Give 2065moral replacement --Continue to monitor daily  Normocytic Anemia, Stable S/p 7 u of pRBC transfusion. Hemoglobin stable.  --Continue Lovenox for DVT prophylaxis unless requiring transfusions --Continue close monitoring   Hypertension, Stable --Continue Coreg 25 mg twice daily --Continue amlodipine 10 mg daily  Diet: 2 Gram Sodium / 1.2L fluid diet IVF: None VTE: Lovenox CODE: Full Code  Prior to Admission Living Arrangement: Home Anticipated Discharge Location: SNF  Barriers to Discharge: IV ABX  Signed: SpeJeralyn Cortez 06/26/2020, 1:19 PM  Pager: [phone] Medicine Teaching Service After 5pm on weekdays and 1pm on weekends: On Call pager: [phone]

## 2020-06-26 NOTE — Plan of Care (Signed)
  Problem: Education: Goal: Knowledge of General Education information will improve Description Including pain rating scale, medication(s)/side effects and non-pharmacologic comfort measures Outcome: Progressing   

## 2020-06-26 NOTE — Progress Notes (Signed)
Physical Therapy Treatment Patient Details Name: Steven Cortez MRN: [id number] DOB: 11-03-1975 Today's Date: 06/26/2020    History of Present Illness Pt is a 45 year old male with PMHx including Hep C and IVDU who presented with back pain, weakness, dyspnea, pleuritic chest pain, urinary retention, and pitting edema. He was recently admitted to [facility] and found to have tricuspid endocarditis with MRSA bacteremia, and pulmonary septic emboli but left AMA. 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 s/pTEE 05/28/20.   2/11 repeat evacuation / extension of decompression    PT Comments    Pt eager to get OOB and progress mobility. Pt tolerated 2x10 ft ambulation with RW with very increased time and effort, as well as seated rest break to recover fatigue. Pt with continued severe back pain during mobility, as well as LE weakness. PT to continue to follow acutely, SNF remains appropriate d/c plan at this time.    Follow Up Recommendations  SNF     Equipment Recommendations  Rolling walker with 5" wheels;3in1 (PT);Wheelchair (measurements PT);Wheelchair cushion (measurements PT)    Recommendations for Other Services       Precautions / Restrictions Precautions Precautions: Fall;Back;Other (comment) Precaution Comments: PCA    Mobility  Bed Mobility Overal bed mobility: Needs Assistance Bed Mobility: Rolling;Sidelying to Sit Rolling: Supervision Sidelying to sit: Min assist;HOB elevated       General bed mobility comments: supervision for roll to L, min assist for completing trunk elevation; pt moves to/from sidelying<>sit x2 because after first time sitting EOB pt wanting to return to elbow to rest.    Transfers Overall transfer level: Needs assistance Equipment used: Rolling walker (2 wheeled) Transfers: Sit to/from Stand Sit to Stand:  Min assist;From elevated surface         General transfer comment: light min assist for power up and steadying upon standing, STS x2 from EOB and from recliner. Verbal cuing for hand placement, but pt requests to place both hands on RW while PT stabilizes.  Ambulation/Gait Ambulation/Gait assistance: Min assist Gait Distance (Feet): 20 Feet (2x10 ft) Assistive device: Rolling walker (2 wheeled) Gait Pattern/deviations: Step-through pattern;Decreased stride length;Trunk flexed Gait velocity: decr   General Gait Details: min assist for steadying, occasional assist for RW navigation. x2 bouts of 10 ft forward and back   Stairs             Wheelchair Mobility    Modified Rankin (Stroke Patients Only)       Balance Overall balance assessment: Needs assistance Sitting-balance support: Feet supported;Bilateral upper extremity supported Sitting balance-Leahy Scale: Fair Sitting balance - Comments: able to sit EOB without PT support, props on bilat UEs   Standing balance support: Bilateral upper extremity supported Standing balance-Leahy Scale: Poor Standing balance comment: Heavy use of RW                            Cognition Arousal/Alertness: Awake/alert Behavior During Therapy: WFL for tasks assessed/performed Overall Cognitive Status: Within Functional Limits for tasks assessed                                 General Comments: increased time to mobilize, can be self-limiting but reports wanting to progress mobility      Exercises  General Comments        Pertinent Vitals/Pain Pain Assessment: Faces Faces Pain Scale: Hurts whole lot Pain Location: back Pain Descriptors / Indicators: Guarding;Grimacing;Spasm;Sharp;Discomfort;Sore Pain Intervention(s): Limited activity within patient's tolerance;Monitored during session;Repositioned    Home Living                      Prior Function            PT Goals (current  goals can now be found in the care plan section) Acute Rehab PT Goals Patient Stated Goal: to walk PT Goal Formulation: With patient Time For Goal Achievement: 07/06/20 Potential to Achieve Goals: Good Progress towards PT goals: Progressing toward goals    Frequency    Min 3X/week      PT Plan Current plan remains appropriate    Co-evaluation              AM-PAC PT "6 Clicks" Mobility   Outcome Measure  Help needed turning from your back to your side while in a flat bed without using bedrails?: None Help needed moving from lying on your back to sitting on the side of a flat bed without using bedrails?: A Little Help needed moving to and from a bed to a chair (including a wheelchair)?: A Little Help needed standing up from a chair using your arms (e.g., wheelchair or bedside chair)?: A Little Help needed to walk in hospital room?: A Little Help needed climbing 3-5 steps with a railing? : A Lot 6 Click Score: 18    End of Session   Activity Tolerance: Patient limited by pain Patient left: in bed;with call bell/phone within reach Nurse Communication: Mobility status PT Visit Diagnosis: Unsteadiness on feet (R26.81);Pain     Time: 1340-1408 PT Time Calculation (min) (ACUTE ONLY): 28 min  Charges:  $Gait Training: 8-22 mins $Therapeutic Activity: 8-22 mins                     Marye Round, PT Acute Rehabilitation Services Pager [phone]  Office [phone]   Truddie Coco 06/26/2020, 3:33 PM

## 2020-06-26 NOTE — Plan of Care (Signed)

## 2020-06-27 DIAGNOSIS — M62830 Muscle spasm of back: Secondary | ICD-10-CM

## 2020-06-27 LAB — RENAL FUNCTION PANEL
Albumin: 1.7 g/dL — ABNORMAL LOW (ref 3.5–5.0)
Anion gap: 9 (ref 5–15)
BUN: 50 mg/dL — ABNORMAL HIGH (ref 6–20)
CO2: 25 mmol/L (ref 22–32)
Calcium: 7.9 mg/dL — ABNORMAL LOW (ref 8.9–10.3)
Chloride: 102 mmol/L (ref 98–111)
Creatinine, Ser: 3.06 mg/dL — ABNORMAL HIGH (ref 0.61–1.24)
GFR, Estimated: 25 mL/min — ABNORMAL LOW (ref >60)
Glucose, Bld: 91 mg/dL (ref 70–99)
Phosphorus: 5.8 mg/dL — ABNORMAL HIGH (ref 2.5–4.6)
Potassium: 3.9 mmol/L (ref 3.5–5.1)
Sodium: 136 mmol/L (ref 135–145)

## 2020-06-27 LAB — CBC
HCT: 25.2 % — ABNORMAL LOW (ref 39.0–52.0)
Hemoglobin: 7.7 g/dL — ABNORMAL LOW (ref 13.0–17.0)
MCH: 27 pg (ref 26.0–34.0)
MCHC: 30.6 g/dL (ref 30.0–36.0)
MCV: 88.4 fL (ref 80.0–100.0)
Platelets: 232 10*3/uL (ref 150–400)
RBC: 2.85 MIL/uL — ABNORMAL LOW (ref 4.22–5.81)
RDW: 15.6 % — ABNORMAL HIGH (ref 11.5–15.5)
WBC: 7 10*3/uL (ref 4.0–10.5)
nRBC: 0 % (ref 0.0–0.2)

## 2020-06-27 MED ORDER — CALCIUM CARBONATE ANTACID 500 MG PO CHEW
1.0000 | CHEWABLE_TABLET | Freq: Three times a day (TID) | ORAL | Status: DC
  Administered 2020-06-27 – 2020-07-15 (×52): 200 mg via ORAL
  Filled 2020-06-27 (×51): qty 1

## 2020-06-27 NOTE — Plan of Care (Signed)

## 2020-06-27 NOTE — Progress Notes (Incomplete)
HD#37 Subjective:  Overnight Events: None   Patient sitting up in recliner comfortably. States he is feeling well today. Denies ches tpain or palpitations. Endorses lower extremity swelling since his admission. He feels as thouhg it is improving.  Objective:  Vital signs in last 24 hours: Vitals:   06/27/20 0400 06/27/20 0437 06/27/20 0800 06/27/20 0831  BP: 139/80  (!) 141/78   Pulse: (!) 36  81   Resp: 14 18 18 18   Temp: 97.7 F (36.5 C)  97.9 F (36.6 C)   TempSrc: Oral  Oral   SpO2: 98% 97% 100% 100%  Weight:      Height:       Supplemental O2: {NAMES:3044014::"Room Air","Nasal Cannula","Simple Face Mask","Partial Rebreather","HFNC","Non Rebreather","Venturi Mask","Bag Valve Mask"} SpO2: 100 % O2 Flow Rate (L/min): 2 L/min FiO2 (%): 28 %   Physical Exam:  Constitutional: well-appearing *** sitting in ***, in no acute distress HENT: normocephalic atraumatic, mucous membranes moist Eyes: conjunctiva non-erythematous Neck: supple Cardiovascular: regular rate and rhythm, no m/r/g Pulmonary/Chest: normal work of breathing on room air, lungs clear to auscultation bilaterally Abdominal: soft, non-tender, non-distended MSK: normal bulk and tone Neurological: alert & oriented x 3, 5/5 strength in bilateral upper and lower extremities, normal gait Skin: warm and dry Psych: ***  Filed Weights   06/25/20 0033 06/26/20 0230 06/27/20 0034  Weight: 97 kg 95.6 kg 92.7 kg     Intake/Output Summary (Last 24 hours) at 06/27/2020 1011 Last data filed at 06/27/2020 0832 Gross per 24 hour  Intake 1307 ml  Output 4200 ml  Net -2893 ml   Net IO Since Admission: -40,353.52 mL [06/27/20 1011]  Pertinent Labs: CBC Latest Ref Rng & Units 06/27/2020 06/26/2020 06/25/2020  WBC 4.0 - 10.5 K/uL 7.0 7.3 8.6  Hemoglobin 13.0 - 17.0 g/dL 7.7(L) 7.7(L) 8.1(L)  Hematocrit 39.0 - 52.0 % 25.2(L) 23.3(L) 25.3(L)  Platelets 150 - 400 K/uL 232 217 220    CMP Latest Ref Rng & Units 06/27/2020  06/26/2020 06/25/2020  Glucose 70 - 99 mg/dL 91 96 96  BUN 6 - 20 mg/dL 08/25/2020) 16(X) 09(U)  Creatinine 0.61 - 1.24 mg/dL 04(V) 4.09(W) 1.19(J)  Sodium 135 - 145 mmol/L 136 136 136  Potassium 3.5 - 5.1 mmol/L 3.9 3.7 3.7  Chloride 98 - 111 mmol/L 102 101 101  CO2 22 - 32 mmol/L 25 25 25   Calcium 8.9 - 10.3 mg/dL 7.9(L) 7.8(L) 7.8(L)  Total Protein 6.5 - 8.1 g/dL - - -  Total Bilirubin 0.3 - 1.2 mg/dL - - -  Alkaline Phos 38 - 126 U/L - - -  AST 15 - 41 U/L - - -  ALT 0 - 44 U/L - - -    Imaging: No results found.  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 with a pertinent PMH of ***, who presented with *** and admitted for ***.    *** ***  *** ***  *** ***  *** ***  Diet: {NAMES:3044014::"Normal","Heart Healthy","Carb-Modified","Renal","Carb/Renal","NPO","TPN","Tube Feeds"} IVF: {NAMES:3044014::"None","NS","1/2 NS","LR","D5","D10"},{NAMES:3044014::"None","10cc/hr","25cc/hr","50cc/hr","75cc/hr","100cc/hr","110cc/hr","125cc/hr","Bolus"} VTE: {NAMES:3044014::"Heparin","Enoxaparin","SCDs","NOAC","None"} Code: {NAMES:3044014::"Full","DNR","DNI","DNR/DNI","Comfort  Care","Unknown"} PT/OT recs: {NAMES:3044014::"None","Pending","CIR","SNF for Subacute PT","LTAC","Home Health"}, {Assistive Devices [id number]. TOC recs: *** Family Update:   Dispo: Anticipated discharge to {Discharge Destination:18313::"Home"} in {NUMBERS:20191} days pending ***.   Thalia Bloodgood DO Internal Medicine Resident PGY-1 Pager [phone] Please contact the on call pager after 5 pm  and on weekends at [phone].

## 2020-06-27 NOTE — Progress Notes (Signed)
HD#37 Subjective:  Overnight Events: No overnight events  Patient resting comfortably in the chair.  He states that he is having some back spasms overnight but has had improvement of his physical functioning.  Patient states he notices that he is having some worsening lower extremity edema.   Objective:  Vital signs in last 24 hours: Vitals:   06/27/20 0800 06/27/20 0831 06/27/20 1100 06/27/20 1141  BP: (!) 141/78  135/71   Pulse: 81  80   Resp: _0 Temp: 97.9 F (36.6 C)  98 F (36.7 C)   TempSrc: Oral  Oral   SpO2: 100% 100% 98% 98%  Weight:      Height:       Supplemental O2: RA SpO2: 98 % O2 Flow Rate (L/min): 2 L/min FiO2 (%): 28 %   Physical Exam:  Physical Exam Constitutional:      Appearance: Normal appearance.  HENT:     Head: Normocephalic.  Eyes:     Extraocular Movements: Extraocular movements intact.  Cardiovascular:     Rate and Rhythm: Normal rate.     Pulses: Normal pulses.     Heart sounds: Normal heart sounds.  Pulmonary:     Effort: Pulmonary effort is normal.     Breath sounds: Normal breath sounds.  Abdominal:     General: Bowel sounds are normal.     Palpations: Abdomen is soft.     Tenderness: There is no abdominal tenderness.  Musculoskeletal:        General: Normal range of motion.     Cervical back: Normal range of motion.     Right lower leg: Edema present.     Left lower leg: Edema present.     Comments: 2-3+ lower extremity edema  Skin:    General: Skin is warm and dry.  Neurological:     Mental Status: He is alert and oriented to person, place, and time. Mental status is at baseline.  Psychiatric:        Mood and Affect: Mood normal.     Filed Weights   06/25/20 0033 06/26/20 0230 06/27/20 0034  Weight: 97 kg 95.6 kg 92.7 kg     Intake/Output Summary (Last 24 hours) at 06/27/2020 1205 Last data filed at 06/27/2020 1100 Gross per 24 hour  Intake 1754 ml  Output 4200 ml  Net -2446 ml   Net IO Since  Admission: -39,906.52 mL [06/27/20 1205]  No results for input(s): GLUCAP in the last 72 hours.   Pertinent Labs: CBC Latest Ref Rng & Units 06/27/2020 06/26/2020 06/25/2020  WBC 4.0 - 10.5 K/uL 7.0 7.3 8.6  Hemoglobin 13.0 - 17.0 g/dL 7.7(L) 7.7(L) 8.1(L)  Hematocrit 39.0 - 52.0 % 25.2(L) 23.3(L) 25.3(L)  Platelets 150 - 400 K/uL 232 217 220    CMP Latest Ref Rng & Units 06/27/2020 06/26/2020 06/25/2020  Glucose 70 - 99 mg/dL 91 96 96  BUN 6 - 20 mg/dL 50(H) 55(H) 50(H)  Creatinine 0.61 - 1.24 mg/dL 3.06(H) 3.08(H) 2.94(H)  Sodium 135 - 145 mmol/L 136 136 136  Potassium 3.5 - 5.1 mmol/L 3.9 3.7 3.7  Chloride 98 - 111 mmol/L 102 101 101  CO2 22 - 32 mmol/L _1 Calcium 8.9 - 10.3 mg/dL 7.9(L) 7.8(L) 7.8(L)  Total Protein 6.5 - 8.1 g/dL - - -  Total Bilirubin 0.3 - 1.2 mg/dL - - -  Alkaline Phos 38 - 126 U/L - - -  AST 15 - 41  U/L - - -  ALT 0 - 44 U/L - - -    Imaging: No results found.  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 Hospital course complicated by acute renal failure requiring HD and large epidural abscess s/p laminectomy x2 (1/29 and 2/11). Now on long course IV abx treatment.   MRSA TV Endocarditis w/ Bacteremia Complicated by Septic Emboli and Epidural Abscess s/p I&D w/ laminectomy x 2 Patient is afebrile and hemodynamically stable.  He is tolerating IV daptomycin.  Dispo to SNF once finishing a week course of IV antibiotic  therapy. -Continue daptomycin  -Weekly ESR/CRP and CK -Continue to wean down PCA pump -Continue IV Dilaudid with PT/OT sessions -Robaxin-750 milligrams 3 times daily  Ventricular Bigeminy Patient asymptomatic -Continue Coreg 25 mg twice daily -We will reach back out to cardiology if symptoms worsen  Acute Kidney Injury on CKD, Stable Nephrotic Range Proteinuria  Bilateral Hydronephrosis  Hematuria  Creatinine remained stable at 3.06 and GFR of 25.  Patient has significant lower extremity swelling but otherwise does not have any acute needs for renal replacement therapy.   -Continue fluid restriction of 1.2 L and 2 g sodium restriction -We will start phosphate binders if need be -Continue daily renal function panels -Avoid nephrotoxic medications -Continue lower extremity TED hose and elevation.  Hypokalemia, Resolved -Continue to monitor daily  Normocytic Anemia, Stable S/p 7 u of pRBC transfusion. Hemoglobin stable.  --Continue Lovenox for DVT prophylaxis unless requiring transfusions --Continue close monitoring   Hypertension, Stable --Continue Coreg 25 mg twice daily --Continue amlodipine 10 mg daily  Diet: Cardiac diet IVF: PO intake VTE: enoxaparin  Code: Full PT/OT: SNF for Subacute PT   Anticipated discharge to Skilled nursing facility pending completion of IV antibiotics.  Lawerance Cruel, D.O.  Internal Medicine Resident, PGY-2 [facility]  Pager: [phone] 12:05 PM, 06/27/2020   Please contact the on call pager after 5 pm and on weekends at [phone].

## 2020-06-27 NOTE — Progress Notes (Signed)
This RN agrees with previous Mining engineer.  Call bell within reach.  Pt denies assistance at this time. Will continue to monitor.

## 2020-06-27 NOTE — Progress Notes (Signed)
Patient ambulated from the recliner to the closet in his room x3.  Patient placed in recliner.

## 2020-06-27 NOTE — Progress Notes (Signed)
30 cc syringe replaced with RN Will Bonnet. MAR did not prompt dual RN sign off. Patient dosing confirmed

## 2020-06-28 DIAGNOSIS — R008 Other abnormalities of heart beat: Secondary | ICD-10-CM

## 2020-06-28 DIAGNOSIS — R809 Proteinuria, unspecified: Secondary | ICD-10-CM

## 2020-06-28 LAB — RENAL FUNCTION PANEL
Albumin: 1.7 g/dL — ABNORMAL LOW (ref 3.5–5.0)
Anion gap: 11 (ref 5–15)
BUN: 49 mg/dL — ABNORMAL HIGH (ref 6–20)
CO2: 26 mmol/L (ref 22–32)
Calcium: 7.9 mg/dL — ABNORMAL LOW (ref 8.9–10.3)
Chloride: 100 mmol/L (ref 98–111)
Creatinine, Ser: 2.85 mg/dL — ABNORMAL HIGH (ref 0.61–1.24)
GFR, Estimated: 27 mL/min — ABNORMAL LOW (ref >60)
Glucose, Bld: 84 mg/dL (ref 70–99)
Phosphorus: 5.9 mg/dL — ABNORMAL HIGH (ref 2.5–4.6)
Potassium: 3.7 mmol/L (ref 3.5–5.1)
Sodium: 137 mmol/L (ref 135–145)

## 2020-06-28 LAB — CBC
HCT: 22.8 % — ABNORMAL LOW (ref 39.0–52.0)
Hemoglobin: 7.3 g/dL — ABNORMAL LOW (ref 13.0–17.0)
MCH: 27.8 pg (ref 26.0–34.0)
MCHC: 32 g/dL (ref 30.0–36.0)
MCV: 86.7 fL (ref 80.0–100.0)
Platelets: 227 10*3/uL (ref 150–400)
RBC: 2.63 MIL/uL — ABNORMAL LOW (ref 4.22–5.81)
RDW: 15.7 % — ABNORMAL HIGH (ref 11.5–15.5)
WBC: 6 10*3/uL (ref 4.0–10.5)
nRBC: 0 % (ref 0.0–0.2)

## 2020-06-28 LAB — CK: Total CK: 23 U/L — ABNORMAL LOW (ref 49–397)

## 2020-06-28 MED ORDER — POTASSIUM CHLORIDE CRYS ER 20 MEQ PO TBCR
30.0000 meq | EXTENDED_RELEASE_TABLET | Freq: Once | ORAL | Status: AC
  Administered 2020-06-28: 30 meq via ORAL
  Filled 2020-06-28: qty 1

## 2020-06-28 MED ORDER — CYCLOBENZAPRINE HCL 5 MG PO TABS
5.0000 mg | ORAL_TABLET | Freq: Three times a day (TID) | ORAL | Status: DC
  Administered 2020-06-28 – 2020-06-29 (×3): 5 mg via ORAL
  Filled 2020-06-28 (×3): qty 1

## 2020-06-28 NOTE — Progress Notes (Signed)
Subjective:   Steven Cortez states that he continues to have spasms of his left upper leg any time he tries to move. Says this has been intermittent over the past 2-3 days although severe. Denies any change in back pain or leg swelling, although notes his right lower leg now feels numb to the touch, which is new. Denies fevers, chills, CP, palpitations, shortness or breath, or any other symptoms. Has been using his PCA pump regularly due to pain with muscle spasms.   Objective:  Vital signs in last 24 hours: Vitals:   06/28/20 0000 06/28/20 0010 06/28/20 0056 06/28/20 0456  BP: 135/83     Pulse: 72     Resp: _0 Temp: 98 F (36.7 C)     TempSrc: Oral     SpO2: 97% 98%  98%  Weight:   92.5 kg   Height:        Filed Weights   06/26/20 0230 06/27/20 0034 06/28/20 0056  Weight: 95.6 kg 92.7 kg 92.5 kg     Intake/Output Summary (Last 24 hours) at 06/28/2020 0751 Last data filed at 06/28/2020 0745 Gross per 24 hour  Intake 1402 ml  Output 3025 ml  Net -1623 ml   Net IO Since Admission: -41,406.52 mL [06/28/20 0751]  No results for input(s): GLUCAP in the last 72 hours.   Pertinent Labs: CBC Latest Ref Rng & Units 06/28/2020 06/27/2020 06/26/2020  WBC 4.0 - 10.5 K/uL 6.0 7.0 7.3  Hemoglobin 13.0 - 17.0 g/dL 7.3(L) 7.7(L) 7.7(L)  Hematocrit 39.0 - 52.0 % 22.8(L) 25.2(L) 23.3(L)  Platelets 150 - 400 K/uL 227 232 217    CMP Latest Ref Rng & Units 06/28/2020 06/27/2020 06/26/2020  Glucose 70 - 99 mg/dL 84 91 96  BUN 6 - 20 mg/dL 49(H) 50(H) 55(H)  Creatinine 0.61 - 1.24 mg/dL 2.85(H) 3.06(H) 3.08(H)  Sodium 135 - 145 mmol/L 137 136 136  Potassium 3.5 - 5.1 mmol/L 3.7 3.9 3.7  Chloride 98 - 111 mmol/L 100 102 101  CO2 22 - 32 mmol/L _1 Calcium 8.9 - 10.3 mg/dL 7.9(L) 7.9(L) 7.8(L)  Total Protein 6.5 - 8.1 g/dL - - -  Total Bilirubin 0.3 - 1.2 mg/dL - - -  Alkaline Phos 38 - 126 U/L - - -  AST 15 - 41 U/L - - -  ALT 0 - 44 U/L - - -    Imaging: No results  found. Physical Exam  General: Patient appears chronically ill. Patient is in acute distress due to pain when attempting to move, otherwise resting comfortably.  Respiratory: Lungs CTA, bilaterally. Mild tachypnea and increased work of breathing with activity. Saturating well on 2L [place].  Cardiovascular: Telemetry shows intermittent bigeminy / NSR. 3/6 murmur heard throughout the left chest, loudest in the tricuspid and mitral regions. There is 1+ pitting edema of bilateral lower extremities to the knees. DP and PT pulses difficult to appreciate in the setting of edema. Neurological: There is decreased sensation to light touch over the right shin. Sensation of the LLE intact. Alert and oriented x 3.  Musculoskeletal: Bilateral LE's are not tender to palpation. There is mild diffuse tenderness to palpation over the lower spine with spasms of the paraspinal and the musculature surrounding the left hip with movement.  GU: Foley in place draining orange-red urine.   Assessment/Plan: Steven Cortez is a 45 y.o. male with hx of IVDU, HTN and untreated hep C who presented for an evaluation of  tricuspid valve endocarditis and MRSA bacteremia. Hospital course complicated by acute renal failure requiring HD and large epidural abscess s/p laminectomy x2 (1/29 and 2/11). Now on long course IV abx treatment.   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 ECHO shows normal systolic function, patient remains asymptomatic and HDS. However, continues to have very high load of bigeminy.  --Continue Coreg 270mtwice dialy  --Start other antiarrhythmic after discussion with cardiology only if newly symptomatic with episodes  Acute Kidney Injury on CKD, Improving Nephrotic Range Proteinuria  Bilateral Hydronephrosis  Hematuria  Creatinine slightly improving, although up from ~1.5 this admission. Likely represents new baseline although hopeful for continued improvement. Likely due to glomerulonephritis vs. IgA nephropathy. Cystoscopy performed which showed erythema of the posterior superior part of the bladder neck, although biopsy returned benign, with chronic inflammation, vascular ectasia, and stromal hemorrhage. Patient is clinically hypervolemic in low albumin state although continues to make good amount of urine. Persistent hematuria although improving. --Nephrology has signed off with plan for outpatient follow up --RD on board; continue 2g sodium and 1.2L fluid restriction --Continue phosphorus monitoring; add binder if elevates further --Continue to avoid fluid hydration as able  --Continue to trend daily RFP --Avoid nephrotoxic agents  --Continue TED hose and LE elevation  Muscle Spasms Patient has had intermittent spasms with movement of the LLE over the past 2-3 days. Potassium 3.7, magnesium previously normal. May be related to increase in mobility s/p spinal surgery. Currently on Robaxin 75020mID.  - Will d/c Robaxin given limited improvement  - Will try cyclobenzaprine 5mg48mD  - Continue to monitor   Hypokalemia,  Resolved Potassium normalized although less than target of 4.0 in setting of bigeminy.  --Give additional PO 30mE37mplacement --Continue to monitor daily  Normocytic Anemia S/p 7 u of pRBC transfusion. Hemoglobin stable.  --Continue Lovenox for DVT prophylaxis unless requiring transfusions --Continue close monitoring  - Transfuse for Hgb < 7  Hypertension, Stable --Continue Coreg 25 mg twice daily --Continue amlodipine 10 mg daily  Diet: 2 Gram Sodium / 1.2L fluid diet IVF: None VTE: Lovenox CODE: Full Code  Prior to Admission Living Arrangement: Home Anticipated Discharge Location: SNF  Barriers to Discharge: IV ABX  Signed: SpeakJeralyn Bennett3/09/2020, 7:51 AM  Pager: [phone] Teaching Service After 5pm on weekdays and 1pm on weekends: On Call pager: [phone]

## 2020-06-28 NOTE — Progress Notes (Signed)
Patient revaluated for decreased sensation of the RLE that started this morning. Patient states this has continue during the day is no better tonight. States he has decreased sensation starts below the knee and down to his foot. Does not feel sensation has gotten any worse. Not having any pain. Able to move the RLE without issue.  On exam patient has 2+ pitting edema of both lower extremities. Palpable DP of the LLE. Unable to palpate DP or PT pulses on the right or by doppler. Lower limbs warm to touch, no pallor, normal strength. Diminished sensation on the right lower extremity and foot.   Paraesthesias concerning for compartment syndrome with continued edema and low albumin. Stat ABI ordered. Will continue to monitor for worsening symptoms with low threshold to consult surgery to evaluate for increased compartment pressure if ABI's abnormal or he symptoms worsen. Consider diuresis if ABI's reassuring.

## 2020-06-29 ENCOUNTER — Inpatient Hospital Stay (HOSPITAL_COMMUNITY): Payer: Medicaid Other

## 2020-06-29 DIAGNOSIS — M62838 Other muscle spasm: Secondary | ICD-10-CM

## 2020-06-29 DIAGNOSIS — I739 Peripheral vascular disease, unspecified: Secondary | ICD-10-CM

## 2020-06-29 DIAGNOSIS — R202 Paresthesia of skin: Secondary | ICD-10-CM

## 2020-06-29 DIAGNOSIS — I339 Acute and subacute endocarditis, unspecified: Secondary | ICD-10-CM

## 2020-06-29 DIAGNOSIS — R208 Other disturbances of skin sensation: Secondary | ICD-10-CM

## 2020-06-29 LAB — PROTEIN / CREATININE RATIO, URINE
Creatinine, Urine: 38.58 mg/dL
Protein Creatinine Ratio: 7.78 mg/mg{Cre} — ABNORMAL HIGH (ref 0.00–0.15)
Total Protein, Urine: 300 mg/dL

## 2020-06-29 LAB — COMPREHENSIVE METABOLIC PANEL
ALT: 21 U/L (ref 0–44)
AST: 15 U/L (ref 15–41)
Albumin: 1.8 g/dL — ABNORMAL LOW (ref 3.5–5.0)
Alkaline Phosphatase: 141 U/L — ABNORMAL HIGH (ref 38–126)
Anion gap: 10 (ref 5–15)
BUN: 50 mg/dL — ABNORMAL HIGH (ref 6–20)
CO2: 25 mmol/L (ref 22–32)
Calcium: 8 mg/dL — ABNORMAL LOW (ref 8.9–10.3)
Chloride: 103 mmol/L (ref 98–111)
Creatinine, Ser: 3.02 mg/dL — ABNORMAL HIGH (ref 0.61–1.24)
GFR, Estimated: 25 mL/min — ABNORMAL LOW (ref >60)
Glucose, Bld: 88 mg/dL (ref 70–99)
Potassium: 4.2 mmol/L (ref 3.5–5.1)
Sodium: 138 mmol/L (ref 135–145)
Total Bilirubin: 0.6 mg/dL (ref 0.3–1.2)
Total Protein: 6.1 g/dL — ABNORMAL LOW (ref 6.5–8.1)

## 2020-06-29 LAB — CBC
HCT: 24.8 % — ABNORMAL LOW (ref 39.0–52.0)
Hemoglobin: 7.6 g/dL — ABNORMAL LOW (ref 13.0–17.0)
MCH: 27 pg (ref 26.0–34.0)
MCHC: 30.6 g/dL (ref 30.0–36.0)
MCV: 88.3 fL (ref 80.0–100.0)
Platelets: 202 10*3/uL (ref 150–400)
RBC: 2.81 MIL/uL — ABNORMAL LOW (ref 4.22–5.81)
RDW: 15.5 % (ref 11.5–15.5)
WBC: 6 10*3/uL (ref 4.0–10.5)
nRBC: 0 % (ref 0.0–0.2)

## 2020-06-29 LAB — CK: Total CK: 21 U/L — ABNORMAL LOW (ref 49–397)

## 2020-06-29 LAB — OSMOLALITY, URINE: Osmolality, Ur: 319 mOsm/kg (ref 300–900)

## 2020-06-29 LAB — SODIUM, URINE, RANDOM: Sodium, Ur: 67 mmol/L

## 2020-06-29 LAB — MAGNESIUM: Magnesium: 2.2 mg/dL (ref 1.7–2.4)

## 2020-06-29 LAB — OSMOLALITY: Osmolality: 311 mOsm/kg — ABNORMAL HIGH (ref 275–295)

## 2020-06-29 MED ORDER — HYDROMORPHONE HCL 2 MG PO TABS
2.0000 mg | ORAL_TABLET | ORAL | Status: DC | PRN
  Filled 2020-06-29: qty 1

## 2020-06-29 MED ORDER — HYDROMORPHONE HCL 2 MG PO TABS
4.0000 mg | ORAL_TABLET | ORAL | Status: DC
  Administered 2020-06-29 – 2020-07-01 (×8): 4 mg via ORAL
  Filled 2020-06-29 (×10): qty 2

## 2020-06-29 MED ORDER — METHOCARBAMOL 500 MG PO TABS
750.0000 mg | ORAL_TABLET | Freq: Four times a day (QID) | ORAL | Status: DC
  Administered 2020-06-29 – 2020-07-15 (×63): 750 mg via ORAL
  Filled 2020-06-29 (×63): qty 2

## 2020-06-29 MED ORDER — HYDROMORPHONE HCL 2 MG PO TABS: 2.0000 mg | ORAL_TABLET | ORAL | Status: DC | PRN

## 2020-06-29 MED ORDER — ACETAMINOPHEN 500 MG PO TABS
1000.0000 mg | ORAL_TABLET | Freq: Three times a day (TID) | ORAL | Status: DC
  Administered 2020-06-29 – 2020-07-15 (×46): 1000 mg via ORAL
  Filled 2020-06-29 (×47): qty 2

## 2020-06-29 NOTE — Progress Notes (Addendum)
PCA pump Dilaudid order D/C per Dr. Laddie Aquas. Dual RN waste in [employer]; 86mL left in syringe;  and PRN will be given per MAR. Will continue to monitor.

## 2020-06-29 NOTE — TOC Progression Note (Signed)
Transition of Care [facility]) - Progression Note    Patient Details  Name: Steven Cortez MRN: [id number] Date of Birth: [birth year]  [facility] CM/SW Contact  Leone Haven, RN Phone Number: 06/29/2020, 12:19 PM  Clinical Narrative:    Patient is hx of IVDU, conts on iv abx, still, PCA pump , having muscle spasms. Tricuspid valve endocarditis and MRSA bacteremia, complicated by acute renal failure requiring HD and large epidural abscess  S/p laminectomy x 2 on 1/29 and 2/11. Now on long term course iv abx. TOC team will continue to follow.         Expected Discharge Plan and Services                                                 Social Determinants of Health (SDOH) Interventions    Readmission Risk Interventions No flowsheet data found.

## 2020-06-29 NOTE — Consult Note (Addendum)
[facility] NOTE   MRN : [id number]  Reason for Consult: Abnormal ABI with non compressible vessels Referring Physician: Dr. Ann Lions  History of Present Illness: 45 y/o male presented to [facility] transferred from [facility] with history of IV drug use,  hepatitis C and prior pyomyositis/osteomyelitis presenting with weakness, dyspnea, pleuritic chest pain, and swelling.  He was recently admitted to [facility] and found to have tricuspid endocarditis with MRSA bacteremia.  He was reportedly being treated with vancomycin and was switched to linezolid when he had persistently positive blood cultures.  He left AMA a few days ago.   He had a complaint of right LE anterior shin numbness since he arrived.  ABI's were ordered and showed non compressible arteries with triphasic flow B LE.  We have been consulted for evaluation based on ABI results.     Current Facility-Administered Medications  Medication Dose Route Frequency Provider Last Rate Last Admin  . 0.9 %  sodium chloride infusion   Intravenous PRN Judith Part, MD   Stopped at 06/02/20 [phone]  . acetaminophen (TYLENOL) tablet 650 mg  650 mg Oral Q4H PRN Lacinda Axon, MD   650 mg at 06/18/20 0456  . albuterol (VENTOLIN HFA) 108 (90 Base) MCG/ACT inhaler 1 puff  1 puff Inhalation Q6H PRN Mitzi Hansen, MD   2 puff at 06/05/20 1559  . amLODipine (NORVASC) tablet 10 mg  10 mg Oral Daily Darrick Meigs, Rylee, MD   10 mg at 06/29/20 0824  . calcium carbonate (TUMS - dosed in mg elemental calcium) chewable tablet 200 mg of elemental calcium  1 tablet Oral TID WC Gilles Chiquito B, MD   200 mg of elemental calcium at 06/29/20 1043  . carvedilol (COREG) tablet 25 mg  25 mg Oral BID WC Gilles Chiquito B, MD   25 mg at 06/29/20 0824  . Chlorhexidine Gluconate Cloth 2 % PADS 6 each  6 each Topical Daily Velna Ochs, MD   6 each at 06/29/20 [phone]  . cyclobenzaprine (FLEXERIL) tablet 5 mg  5 mg  Oral TID Jeralyn Bennett, MD   5 mg at 06/29/20 0824  . DAPTOmycin (CUBICIN) 770 mg in sodium chloride 0.9 % IVPB  770 mg Intravenous Q2000 Sid Falcon, MD   Stopped at 06/28/20 2057  . diphenhydrAMINE (BENADRYL) injection 12.5 mg  12.5 mg Intravenous Q6H PRN Christian, Rylee, MD       Or  . diphenhydrAMINE (BENADRYL) 12.5 MG/5ML elixir 12.5 mg  12.5 mg Oral Q6H PRN Darrick Meigs, Rylee, MD   12.5 mg at 06/18/20 2225  . enoxaparin (LOVENOX) injection 40 mg  40 mg Subcutaneous Daily Jeralyn Bennett, MD   40 mg at 06/29/20 0824  . feeding supplement (ENSURE ENLIVE / ENSURE PLUS) liquid 237 mL  237 mL Oral BID BM Velna Ochs, MD   237 mL at 06/29/20 1332  . ferrous sulfate tablet 325 mg  325 mg Oral Q breakfast Gilles Chiquito B, MD   325 mg at 06/29/20 0824  . HYDROmorphone (DILAUDID) 1 mg/mL PCA injection   Intravenous Q4H Christian, Rylee, MD   30 mg at 06/27/20 1948  . HYDROmorphone (DILAUDID) injection 1 mg  1 mg Intravenous Daily PRN Lacinda Axon, MD   1 mg at 06/29/20 1043  . multivitamin with minerals tablet 1 tablet  1 tablet Oral Daily Velna Ochs, MD   1 tablet at 06/29/20 [phone]  . naloxone (NARCAN) injection 0.4 mg  0.4 mg Intravenous  PRN Mitzi Hansen, MD       And  . sodium chloride flush (NS) 0.9 % injection 9 mL  9 mL Intravenous PRN Christian, Rylee, MD      . ondansetron (ZOFRAN) injection 4 mg  4 mg Intravenous Q6H PRN Christian, Rylee, MD      . polyethylene glycol (MIRALAX / GLYCOLAX) packet 17 g  17 g Oral BID Lacinda Axon, MD   17 g at 06/25/20 2109  . senna-docusate (Senokot-S) tablet 2 tablet  2 tablet Oral BID Mitzi Hansen, MD   2 tablet at 06/25/20 2109  . sodium chloride flush (NS) 0.9 % injection 10-40 mL  10-40 mL Intracatheter Q12H Velna Ochs, MD   10 mL at 06/29/20 0825  . sodium chloride flush (NS) 0.9 % injection 10-40 mL  10-40 mL Intracatheter PRN Velna Ochs, MD   10 mL at 06/14/20 0101    Pt meds include: Statin  :No Betablocker: Yes ASA: No Other anticoagulants/antiplatelets: none  Past Medical History:  Diagnosis Date  . Acute renal failure ([place]) 05/22/2020  . Endocarditis     Past Surgical History:  Procedure Laterality Date  . BUBBLE STUDY  05/28/2020   Procedure: BUBBLE STUDY;  Surgeon: Elouise Munroe, MD;  Location: [place];  Service: Cardiology;;  . Consuela Mimes W/ URETERAL STENT PLACEMENT Bilateral 06/21/2020   Procedure: Jasmine December UNDER ANESTHESIA; CYSTOSCOPY WITH BILATERAL RETROGRADE PYELOGRAM;  Surgeon: Festus Aloe, MD;  Location: [place];  Service: Urology;  Laterality: Bilateral;  . CYSTOSCOPY WITH BIOPSY N/A 06/21/2020   Procedure: BIOPSY OF BLADDER AND FULGURATION AND EXCHANGE OF FOLEY;  Surgeon: Festus Aloe, MD;  Location: [place];  Service: Urology;  Laterality: N/A;  . IR FLUORO GUIDE CV LINE RIGHT  05/22/2020  . IR US GUIDE VASC ACCESS RIGHT  05/22/2020  . LUMBAR LAMINECTOMY/ DECOMPRESSION WITH MET-RX N/A 05/23/2020   Procedure: MINIMALLY INVASIVE LUMBAR FIVE LAMINECTOMY FOR EVACUATION OF EPIDURAL ABSCESS;  Surgeon: Judith Part, MD;  Location: [place];  Service: Neurosurgery;  Laterality: N/A;  . LUMBAR LAMINECTOMY/ DECOMPRESSION WITH MET-RX N/A 06/05/2020   Procedure: REPEAT LUMBAR FOUR-FIVE , LUMBAR FIVE- SACRAL ONE LUMBAR LAMINECTOMY/ DECOMPRESSION WITH MET-RX;  Surgeon: Judith Part, MD;  Location: [place];  Service: Neurosurgery;  Laterality: N/A;  . NO PAST SURGERIES    . TEE WITHOUT CARDIOVERSION N/A 05/28/2020   Procedure: TRANSESOPHAGEAL ECHOCARDIOGRAM (TEE);  Surgeon: Elouise Munroe, MD;  Location: [place];  Service: Cardiology;  Laterality: N/A;    Social History Social History   Tobacco Use  . Smoking status: Current Every Day Smoker    Packs/day: 1.00    Years: 14.00    Pack years: 14.00    Types: Cigarettes  . Smokeless tobacco: Never Used  Vaping Use  . Vaping Use: Never used  Substance Use Topics  . Alcohol use: Not Currently   . Drug use: Yes    Family History History reviewed. No pertinent family history.  No Known Allergies   REVIEW OF SYSTEMS  General: _0  Weight loss, _1  Fever, _2  chills Neurologic: _3  Dizziness, _4  Blackouts, _5  Seizure _6  Stroke, _7  "Mini stroke", _8  Slurred speech, _9  Temporary blindness; _10  weakness in arms or legs, _11  Hoarseness _12  Dysphagia Cardiac: _13  Chest pain/pressure, _14  Shortness of breath at rest _15  Shortness of breath with exertion, _16  Atrial fibrillation or irregular heartbeat  Vascular: _17  Pain in legs  with walking, _0  Pain in legs at rest, _1  Pain in legs at night,  _2  Non-healing ulcer, _3  Blood clot in vein/DVT,   Pulmonary: _4  Home oxygen, _5  Productive cough, _6  Coughing up blood, _7  Asthma,  _8  Wheezing _9  COPD Musculoskeletal:  _10  Arthritis, [x ] Low back pain, _11  Joint pain Hematologic: _12  Easy Bruising, [x ] Anemia; [x ] Hepatitis Gastrointestinal: _13  Blood in stool, _14  Gastroesophageal Reflux/heartburn, Urinary: _15  chronic Kidney disease, _16  on HD - _17  MWF or _18  TTHS, _19  Burning with urination, _20  Difficulty urinating Skin: _21  Rashes, _22  Wounds Psychological: _23  Anxiety, _24  Depression  Physical Examination Vitals:   06/29/20 0700 06/29/20 0826 06/29/20 1155 06/29/20 1332  BP: (!) 142/88  125/80   Pulse: 73  72   Resp: _25 Temp: 98.5 F (36.9 C)  98.4 F (36.9 C)   TempSrc: Oral  Oral   SpO2: 99% 98% 100% 100%  Weight:      Height:       Body mass index is 29.29 kg/m.  General:  WDWN in NAD Gait: Normal HENT: WNL Eyes: Pupils equal Pulmonary: normal non-labored breathing , without Rales, rhonchi,  Wheezing on 2 L O2 [place] Cardiac: RRR, with  Murmurs, rubs or gallops; No carotid bruits Abdomen: soft, NT, no masses Skin: no rashes, ulcers noted;  no Gangrene , no cellulitis; no open wounds;   Vascular Exam/Pulses:Radial  Pulses B, triphasic flow B LE with doppler   Musculoskeletal: no muscle  wasting or atrophy; Positive B LE edema  Neurologic: A&O X 3; Appropriate Affect ;  SENSATION: normal; decreased over right anterior shin MOTOR FUNCTION: 5/5 Symmetric Speech is fluent/normal   Significant Diagnostic Studies: CBC Lab Results  Component Value Date   WBC 6.0 06/29/2020   HGB 7.6 (L) 06/29/2020   HCT 24.8 (L) 06/29/2020   MCV 88.3 06/29/2020   PLT 202 06/29/2020    BMET    Component Value Date/Time   NA 138 06/29/2020 0428   K 4.2 06/29/2020 0428   CL 103 06/29/2020 0428   CO2 25 06/29/2020 0428   GLUCOSE 88 06/29/2020 0428   BUN 50 (H) 06/29/2020 0428   CREATININE 3.02 (H) 06/29/2020 0428   CALCIUM 8.0 (L) 06/29/2020 0428   GFRNONAA 25 (L) 06/29/2020 0428   Estimated Creatinine Clearance: 35.7 mL/min (A) (by C-G formula based on SCr of 3.02 mg/dL (H)).  COAG Lab Results  Component Value Date   INR 1.2 05/29/2020     Non-Invasive Vascular Imaging:  ABI Findings:  +--------+------------------+-----+---------+--------+  Right  Rt Pressure (mmHg)IndexWaveform Comment   +--------+------------------+-----+---------+--------+  Brachial                 PICC    +--------+------------------+-----+---------+--------+  PTA   191        1.39 triphasic      +--------+------------------+-----+---------+--------+  DP   183        1.34 triphasic      +--------+------------------+-----+---------+--------+   +--------+------------------+-----+---------+-------+  Left  Lt Pressure (mmHg)IndexWaveform Comment  +--------+------------------+-----+---------+-------+  [id number]           triphasic      +--------+------------------+-----+---------+-------+  PTA   175        1.28 triphasic      +--------+------------------+-----+---------+-------+  DP   172        1.26  triphasic      +--------+------------------+-----+---------+-------+    Summary:  Right: Resting right ankle-brachial index indicates noncompressible right  lower extremity arteries.   Left: Resting left ankle-brachial index is within normal range. No  evidence of significant left lower extremity arterial disease.   ASSESSMENT/PLAN:  ABI's indicate calcified arteries B LE with triphasic flow. He denise symptoms of claudication, non healing wounds, or rest pain.  The anterior right shin numbness started 3 days prior.  We will order Artrial duplex study to confirm flow. Activity as tolerates.   Roxy Horseman 06/29/2020 2:18 PM   History and exam details as above.  Patient has developed some numbness over his right anterior compartment.  It is intermittent in nature.  He does not complain of pain in the foot.  He has never had claudication symptoms prior to arrival in the hospital.  He does have a history of smoking.  On my exam he has 2+ dorsalis pedis pulses bilaterally.  His ABIs showed triphasic waveforms bilaterally but noncompressible.  I do not believe he has any significant peripheral arterial disease but since his vessels were calcified we will get a lower extremity arterial duplex of both legs for completeness.  He has had a prior intervention for a lumbar epidural abscess on January 29 as well as evacuation of an epidural phlegmon and laminectomy of his spine on February 11.  This certainly may be related to the numbness in his right leg.  We will follow up with him after his duplex scan.  Ruta Hinds, MD [facility] [place] Office: [phone]

## 2020-06-29 NOTE — Progress Notes (Signed)
Physical Therapy Treatment Patient Details Name: Steven Cortez MRN: [id number] DOB: [birth year] Today's Date: 06/29/2020    History of Present Illness Pt is a 45 year old male with PMHx including Hep C and IVDU who presented with back pain, weakness, dyspnea, pleuritic chest pain, urinary retention, and pitting edema. 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 s/pTEE 05/28/20.   2/11 repeat evacuation / extension of decompression    PT Comments    Patient progressing well towards PT goals. Premedicated with IV Dilaudid prior to session per request. Tolerated bed mobility, transfers and gait training with supervision for safety. Requires bed height to be elevated and increased time for all transitions but no physical assist provided today. Reports feeling good once upright with no worsening of pain or symptoms. Encouraged increasing activity as able especially when feeling good. Will continue to follow and progress.    Follow Up Recommendations  Home health PT;Supervision for mobility/OOB     Equipment Recommendations  Rolling walker with 5" wheels;3in1 (PT);Wheelchair (measurements PT);Wheelchair cushion (measurements PT)    Recommendations for Other Services       Precautions / Restrictions Precautions Precautions: Fall;Back;Other (comment) Precaution Booklet Issued: Yes (comment) Precaution Comments: PCA Restrictions Weight Bearing Restrictions: No    Mobility  Bed Mobility Overal bed mobility: Needs Assistance Bed Mobility: Rolling;Sidelying to Sit Rolling: Supervision Sidelying to sit: Supervision;HOB elevated       General bed mobility comments: Supervision for bed mobility, able to roll  towards left side using rail for support; increased time with transitions but no physical assist needed.    Transfers Overall transfer level: Needs assistance Equipment used: Rolling walker (2 wheeled) Transfers: Sit to/from Stand Sit to Stand: Supervision;From elevated surface         General transfer comment: Supervision to rise from elevated bed height with pt pulling up on RW to stand, transferred to chair post ambulation.  Ambulation/Gait Ambulation/Gait assistance: Supervision Gait Distance (Feet): 212 Feet Assistive device: Rolling walker (2 wheeled) Gait Pattern/deviations: Step-through pattern;Decreased stride length;Trunk flexed Gait velocity: decr   General Gait Details: Slow, mostly steady gait with RW for support; 2 standing rest breaks.   Stairs             Wheelchair Mobility    Modified Rankin (Stroke Patients Only)       Balance Overall balance assessment: Needs assistance Sitting-balance support: Feet supported;Bilateral upper extremity supported Sitting balance-Leahy Scale: Fair Sitting balance - Comments: able to sit EOB without PT support, props on bilat UEs   Standing balance support: During functional activity Standing balance-Leahy Scale: Poor Standing balance comment: Heavy use of RW                            Cognition Arousal/Alertness: Awake/alert Behavior During Therapy: WFL for tasks assessed/performed Overall Cognitive Status: Within Functional Limits for tasks assessed                                        Exercises      General Comments General comments (skin integrity, edema, etc.): VSS on RA.      Pertinent  Vitals/Pain Pain Assessment: 0-10 Pain Score: 6  Pain Location: back Pain Descriptors / Indicators: Guarding;Grimacing;Spasm;Discomfort;Sore Pain Intervention(s): Monitored during session;Repositioned;Premedicated before session;PCA encouraged    Home Living                       Prior Function            PT Goals (current goals can now be found in the care plan section) Progress towards PT goals: Progressing toward goals    Frequency    Min 3X/week      PT Plan Discharge plan needs to be updated    Co-evaluation              AM-PAC PT "6 Clicks" Mobility   Outcome Measure  Help needed turning from your back to your side while in a flat bed without using bedrails?: None Help needed moving from lying on your back to sitting on the side of a flat bed without using bedrails?: A Little Help needed moving to and from a bed to a chair (including a wheelchair)?: A Little Help needed standing up from a chair using your arms (e.g., wheelchair or bedside chair)?: A Little Help needed to walk in hospital room?: A Little Help needed climbing 3-5 steps with a railing? : A Little 6 Click Score: 19    End of Session Equipment Utilized During Treatment: Gait belt Activity Tolerance: Patient tolerated treatment well Patient left: in chair;with call bell/phone within reach Nurse Communication: Mobility status PT Visit Diagnosis: Unsteadiness on feet (R26.81);Pain Pain - part of body:  (back)     Time: 4854-6270 PT Time Calculation (min) (ACUTE ONLY): 34 min  Charges:  $Gait Training: 23-37 mins                     [name], PT, DPT Acute Rehabilitation Services Pager [phone] Office [phone]       [name] 06/29/2020, 12:41 PM

## 2020-06-29 NOTE — Progress Notes (Signed)
ABI  Completed   Please see CV Proc for preliminary results.   Jalei Shibley, RVT  

## 2020-06-29 NOTE — Progress Notes (Signed)
HD#39 Subjective:  Overnight Events: Concern for decreased pulses in his right lower extremity with paresthesias over the dorsal aspect of his right foot.  Arterial Dopplers were performed  Patient resting comfortably in his chair.  Continues to have increased swelling of his right leg compared with his left.  Does have some increased pain in his right lower extremity with paresthesias over the dorsal aspect of his foot.  He admits to having increased muscle spasms particularly at night.  He believes that his previous medications were his medicines improve his symptoms better than his current medication. Otherwise he denies any significant symptoms  Objective:  Vital signs in last 24 hours: Vitals:   06/29/20 1155 06/29/20 1332 06/29/20 1535 06/29/20 1543  BP: 125/80     Pulse: 72     Resp: 13 13 13  (!) 22  Temp: 98.4 F (36.9 C)     TempSrc: Oral     SpO2: 100% 100% 100%   Weight:      Height:       Supplemental O2: Room Air SpO2: 100 % O2 Flow Rate (L/min): 2 L/min FiO2 (%): 28 %   Physical Exam:  Physical Exam Constitutional:      Appearance: Normal appearance. He is ill-appearing.  HENT:     Head: Normocephalic and atraumatic.     Mouth/Throat:     Mouth: Mucous membranes are moist.  Eyes:     Extraocular Movements: Extraocular movements intact.  Cardiovascular:     Rate and Rhythm: Normal rate.     Pulses: Normal pulses.     Heart sounds: Normal heart sounds.  Pulmonary:     Effort: Pulmonary effort is normal.     Breath sounds: Normal breath sounds.  Abdominal:     General: Abdomen is flat. Bowel sounds are normal. There is no distension.     Palpations: Abdomen is soft.     Tenderness: There is no abdominal tenderness.  Musculoskeletal:        General: Swelling (RLU> LLE) and tenderness (pain in RLE with parethesias over the dorsal aspect of foot) present. Normal range of motion.     Cervical back: Normal range of motion.     Comments: Decreased pulse in  the right LE. Soft/Thready 1+  Skin:    General: Skin is warm and dry.     Findings: No erythema or lesion.  Neurological:     General: No focal deficit present.     Mental Status: He is alert. Mental status is at baseline.     Motor: Weakness (generalized) present.  Psychiatric:        Mood and Affect: Mood normal.     Filed Weights   06/27/20 0034 06/28/20 0056 06/29/20 0007  Weight: 92.7 kg 92.5 kg 92.6 kg     Intake/Output Summary (Last 24 hours) at 06/29/2020 1548 Last data filed at 06/29/2020 1257 Gross per 24 hour  Intake 1102 ml  Output 3125 ml  Net -2023 ml   Net IO Since Admission: -43,172.52 mL [06/29/20 1548]  No results for input(s): GLUCAP in the last 72 hours.   Pertinent Labs: CBC Latest Ref Rng & Units 06/29/2020 06/28/2020 06/27/2020  WBC 4.0 - 10.5 K/uL 6.0 6.0 7.0  Hemoglobin 13.0 - 17.0 g/dL 7.6(L) 7.3(L) 7.7(L)  Hematocrit 39.0 - 52.0 % 24.8(L) 22.8(L) 25.2(L)  Platelets 150 - 400 K/uL 202 227 232    CMP Latest Ref Rng & Units 06/29/2020 06/28/2020 06/27/2020  Glucose 70 - 99  mg/dL 88 84 91  BUN 6 - 20 mg/dL 50(H) 49(H) 50(H)  Creatinine 0.61 - 1.24 mg/dL 3.02(H) 2.85(H) 3.06(H)  Sodium 135 - 145 mmol/L 138 137 136  Potassium 3.5 - 5.1 mmol/L 4.2 3.7 3.9  Chloride 98 - 111 mmol/L 103 100 102  CO2 22 - 32 mmol/L 25 26 25   Calcium 8.9 - 10.3 mg/dL 8.0(L) 7.9(L) 7.9(L)  Total Protein 6.5 - 8.1 g/dL 6.1(L) - -  Total Bilirubin 0.3 - 1.2 mg/dL 0.6 - -  Alkaline Phos 38 - 126 U/L 141(H) - -  AST 15 - 41 U/L 15 - -  ALT 0 - 44 U/L 21 - -    Imaging: VAS [place] ABI WITH/WO TBI  Right: Resting right ankle-brachial index indicates noncompressible right lower extremity arteries. Left: Resting left ankle-brachial index is within normal range. No evidence of significant left lower extremity arterial disease.      Assessment/Plan:   Principal Problem:   Endocarditis Active Problems:   Urinary retention   Acute renal failure (HCC)   Hyperkalemia   Microcytic  anemia   MRSA infection   Epidural abscess   Opioid use disorder   Hydronephrosis   Malnutrition of moderate degree   Antibiotic long-term use   Chronic viral hepatitis B without delta agent and without coma (HCC)   Fever   Anemia of chronic disease   Patient Summary: Steven Cortez is a 45 y.o. 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 Hospital course complicated by acute renal failure requiring HD and large epidural abscess s/p laminectomy x2 (1/29 and 2/11). Now on long course IV abx treatment.    MRSA TV Endocarditis w/ Bacteremia Complicated by Septic Emboli and Epidural Abscess s/p I&D w/ laminectomy x 2 Patient hemodynamically stable and afebrile.  Tolerating IV daptomycin.  PICC line in place.  Will be discharged after 8 weeks of IV antibiotics.  Current plan is SNF for subacute PT.  End date for antibiotics will be April 13.  -Continue daptomycin daily per ID -Continue ESR/CRP and CK weekly -As needed Tylenol as needed for fever -We will switch PCA pump to oral pain management  -We will restart Robaxin-750 milligrams 3 times daily will likely need to increase dose. - Continue PT/OT  Noncompressible arteries, right lower extremity Arterial emboli versus PAD: Has ABI showing right lower extremity noncompressible arteries in the setting of MRSA endocarditis concerning for arterial emboli.  Vascular surgery was consulted and plan to perform arterial Dopplers. -Appreciate vascular surgery assistance. -Off on lower extremity wraps until arterial disease has been further evaluated  Ventricular Bigeminy -Continues to have bigeminy and -Continue Coreg 25 mg twice daily  Acute Kidney Injury on CKD, Improving Nephrotic Range Proteinuria  Bilateral Hydronephrosis  Hematuria  Kidney function stable at 3.0.  Continues to have good urine output.  Remains clinically hypervolemic with no emergent  signs or symptoms of uremia or need for RRT. -Continue renal diet with 2 g sodium restriction and 1.2 L fluid restriction -Continue to trend phosphorus and add phosphate binders as needed -Avoid nephrotoxic agents when possible -Continue TED hose and elevating lower extremities due to edema.  Muscle Spasms -Continue to trend electrolytes and replete as needed  -We will restart Robaxin and titrate dose as needed - Continue to monitor    Diet: Renal diet IVF: PO intake VTE: enoxaparin (LOVENOX) injection 40 mg Start: 06/23/20 1200 Place and maintain sequential  compression device Start: 06/23/20 0748 Place TED hose Start: 06/08/20 4098 Code: Full PT/OT: SNF for Subacute PT    Anticipated discharge to Skilled nursing facility in 37 days pending placement of IV antibiotics.  Lawerance Cruel, D.O.  Internal Medicine Resident, PGY-2 [facility]  Pager: [phone] 3:48 PM, 06/29/2020   Please contact the on call pager after 5 pm and on weekends at [phone].

## 2020-06-30 ENCOUNTER — Inpatient Hospital Stay (HOSPITAL_COMMUNITY): Payer: Medicaid Other

## 2020-06-30 DIAGNOSIS — Z9889 Other specified postprocedural states: Secondary | ICD-10-CM

## 2020-06-30 DIAGNOSIS — R201 Hypoesthesia of skin: Secondary | ICD-10-CM

## 2020-06-30 LAB — CBC
HCT: 23.6 % — ABNORMAL LOW (ref 39.0–52.0)
Hemoglobin: 7.3 g/dL — ABNORMAL LOW (ref 13.0–17.0)
MCH: 27 pg (ref 26.0–34.0)
MCHC: 30.9 g/dL (ref 30.0–36.0)
MCV: 87.4 fL (ref 80.0–100.0)
Platelets: 197 10*3/uL (ref 150–400)
RBC: 2.7 MIL/uL — ABNORMAL LOW (ref 4.22–5.81)
RDW: 15.4 % (ref 11.5–15.5)
WBC: 5.6 10*3/uL (ref 4.0–10.5)
nRBC: 0 % (ref 0.0–0.2)

## 2020-06-30 LAB — RENAL FUNCTION PANEL
Albumin: 1.8 g/dL — ABNORMAL LOW (ref 3.5–5.0)
Anion gap: 11 (ref 5–15)
BUN: 51 mg/dL — ABNORMAL HIGH (ref 6–20)
CO2: 24 mmol/L (ref 22–32)
Calcium: 8.2 mg/dL — ABNORMAL LOW (ref 8.9–10.3)
Chloride: 103 mmol/L (ref 98–111)
Creatinine, Ser: 2.97 mg/dL — ABNORMAL HIGH (ref 0.61–1.24)
GFR, Estimated: 26 mL/min — ABNORMAL LOW (ref >60)
Glucose, Bld: 92 mg/dL (ref 70–99)
Phosphorus: 5.5 mg/dL — ABNORMAL HIGH (ref 2.5–4.6)
Potassium: 3.7 mmol/L (ref 3.5–5.1)
Sodium: 138 mmol/L (ref 135–145)

## 2020-06-30 MED ORDER — HYDROMORPHONE HCL 1 MG/ML IJ SOLN
0.5000 mg | Freq: Every day | INTRAMUSCULAR | Status: AC | PRN
  Administered 2020-07-01 – 2020-07-02 (×2): 0.5 mg via INTRAVENOUS
  Filled 2020-06-30 (×2): qty 0.5

## 2020-06-30 MED ORDER — HYDROMORPHONE HCL 1 MG/ML IJ SOLN: 0.5000 mg | INTRAMUSCULAR | Status: DC | PRN

## 2020-06-30 NOTE — Progress Notes (Signed)
Lower extremity arterial duplex bilateral study completed.   Please see CV Proc for preliminary results.   Jean Rosenthal, RDMS, RVT

## 2020-06-30 NOTE — Progress Notes (Signed)
°  Date: 06/30/2020  Patient name: Regnald Remund  Medical record number: [id number]  Date of birth: 1975/11/10        I have seen and evaluated this patient and I have discussed the plan of care with the house staff. Please see Dr. Jerene Canny note for complete details. I concur with her findings and plan.   Inez Catalina, MD 06/30/2020, 5:50 PM

## 2020-06-30 NOTE — Progress Notes (Signed)
HD#40 Subjective:   No acute events overnight. Today, Steven Cortez states he is feeling much better with relief of his back and leg spasms. He denies any change in bilateral lower extremity swelling or right shin numbness. He notes his pain is overall well controlled with switch to PO dilaudid, although doesn't feel his pain coverage is as good as it had been IV for PT/OT sessions. Notes his mother with cancer's birthday is [birth year] and is hopeful for discharge that day, at least for 1 day. Explained the need for total of 8 weeks' in-hospital ABX. Denies CP, palpitations, fevers, or any other symptoms.  Objective:  Vital signs in last 24 hours: Vitals:   06/30/20 0028 06/30/20 0333 06/30/20 0730 06/30/20 1107  BP: 139/76 139/84  134/82  Pulse: 70 74  77  Resp: 18 18  16   Temp: 97.8 F (36.6 C) 98.8 F (37.1 C) 97.6 F (36.4 C) (!) 97.5 F (36.4 C)  TempSrc: Oral Oral Oral Oral  SpO2: 95% 96%  99%  Weight: 93 kg     Height:       Supplemental O2: Room Air SpO2: 99 % O2 Flow Rate (L/min): 2 L/min FiO2 (%): 28 %   Physical Exam:   General: Patient appears chronically ill. No acute distress. Respiratory: Breathing comfortably on room air. Cardiovascular: Telemetry shows intermittent bigeminy / NSR. 3/6 murmur heard throughout the left chest, loudest in the tricuspid and mitral regions. There is 1-2+ pitting edema of the bilateral LE's to the knees.  Musculoskeletal: Bilateral LE's are not tender to palpation.  GU: Foley in place draining orange-red urine.   Filed Weights   06/28/20 0056 06/29/20 0007 06/30/20 0028  Weight: 92.5 kg 92.6 kg 93 kg     Intake/Output Summary (Last 24 hours) at 06/30/2020 1143 Last data filed at 06/30/2020 0839 Gross per 24 hour  Intake 810.45 ml  Output 3350 ml  Net -2539.55 ml   Net IO Since Admission: -46,552.07 mL [06/30/20 1143]  No results for input(s): GLUCAP in the last 72 hours.   Pertinent Labs: CBC Latest Ref Rng & Units  06/30/2020 06/29/2020 06/28/2020  WBC 4.0 - 10.5 K/uL 5.6 6.0 6.0  Hemoglobin 13.0 - 17.0 g/dL 7.3(L) 7.6(L) 7.3(L)  Hematocrit 39.0 - 52.0 % 23.6(L) 24.8(L) 22.8(L)  Platelets 150 - 400 K/uL 197 202 227    CMP Latest Ref Rng & Units 06/30/2020 06/29/2020 06/28/2020  Glucose 70 - 99 mg/dL 92 88 84  BUN 6 - 20 mg/dL 51(H) 50(H) 49(H)  Creatinine 0.61 - 1.24 mg/dL 2.97(H) 3.02(H) 2.85(H)  Sodium 135 - 145 mmol/L 138 138 137  Potassium 3.5 - 5.1 mmol/L 3.7 4.2 3.7  Chloride 98 - 111 mmol/L 103 103 100  CO2 22 - 32 mmol/L 24 25 26   Calcium 8.9 - 10.3 mg/dL 8.2(L) 8.0(L) 7.9(L)  Total Protein 6.5 - 8.1 g/dL - 6.1(L) -  Total Bilirubin 0.3 - 1.2 mg/dL - 0.6 -  Alkaline Phos 38 - 126 U/L - 141(H) -  AST 15 - 41 U/L - 15 -  ALT 0 - 44 U/L - 21 -    Imaging: VAS [place] ABI WITH/WO TBI  Right: Resting right ankle-brachial index indicates noncompressible right lower extremity arteries. Left: Resting left ankle-brachial index is within normal range. No evidence of significant left lower extremity arterial disease.      Assessment/Plan:   Principal Problem:   Endocarditis Active Problems:   Urinary retention   Acute renal failure ([place])  Hyperkalemia   Microcytic anemia   MRSA infection   Epidural abscess   Opioid use disorder   Hydronephrosis   Malnutrition of moderate degree   Antibiotic long-term use   Chronic viral hepatitis B without delta agent and without coma (HCC)   Fever   Anemia of chronic disease   Patient Summary: Steven Cortez is a 45 y.o. 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 Hospital course complicated by acute renal failure requiring HD and large epidural abscess s/p laminectomy x2 (1/29 and 2/11). Now on long course IV abx treatment.   MRSA TV Endocarditis w/ Bacteremia Complicated by Septic Emboli and Epidural Abscess s/p I&D w/ laminectomy x 2 Patient continues to be afebrile  without leukocytosis on IV daptomycin via PICC line. Explained he will require 8 weeks of in-patient ABX. Current plan is SNF for subacute PT. End date for antibiotics will be August 05, 2020. -Continue daptomycin daily per ID -Continue ESR/CRP and CK weekly -As needed Tylenol as needed for fever -Continue oral dilaudid for pain control with goal to slowly wean down -IV Dilaudid PRN prior to PT/OT sessions for now -Continue Robaxin 710m four times daily -Continue PT/OT  RLE Hypoesthesia  PAD vs. Arterial Emboli  Vascular consulted and ABI of bilateral LE's show triphasic flow with noncompressible arteries on the right; in the setting of MRSA endocarditis, concerning for arterial emboli. RLE symptoms may also be in the setting of epidural phlegmon s/p laminectomy 2/11; however, given this is a new finding and not dermatomal, less likely.  -Appreciate vascular surgery assistance -Check bilateral LE Duplex ultrasounds  -Off of lower extremity wraps until arterial disease has been further evaluated  Ventricular Bigeminy Continues to have significant bigeminy load although remains asymptomatic from this. -Continue Coreg 25 mg twice daily -Will reach out to cardiology if patient becomes symptomatic   Acute Kidney Injury on CKD, Stable  Nephrotic Range Proteinuria  Hematuria  Kidney function stable, Cr ~3. Continues to have good urine output with improving hematuria. Remains clinically hypervolemic with no emergent signs or symptoms of uremia or need for RRT. Urine studies consistent with intrinsic renal dysfunction with FeNa 1.7% and slight increase in protein to creatinine ratio over the last couple weeks.  -Continue 2 g sodium restriction and 1.2 L fluid restriction diet -Continue to trend phosphorus, improving -Avoid nephrotoxic agents when possible  Muscle Spasms Likely in the setting of low back surgeries.  -Continue Robaxin 7562mfour times daily -Replace electrolytes as  needed -Consider repeat spinal images if new/worsening symptoms   Diet: 2g Na, 1.2L fluid-restricted diet  IVF: PO intake VTE: Lovenox Code: Full PT/OT: SNF for Subacute PT    Anticipated discharge to Skilled nursing facility in 37 days pending placement of IV antibiotics.  RaJeralyn BennettMD Internal Medicine Resident, PGY-1 [facility]  Pager: #[phone] AM, 06/30/2020   Please contact the on call pager after 5 pm and on weekends at [phone]

## 2020-06-30 NOTE — Progress Notes (Signed)
Occupational Therapy Treatment Patient Details Name: Lorren Splawn MRN: [id number] DOB: 12/18/1975 Today's Date: 06/30/2020    History of present illness Pt is a 45 year old male with PMHx including Hep C and IVDU who presented with back pain, weakness, dyspnea, pleuritic chest pain, urinary retention, and pitting edema. 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 s/pTEE 05/28/20.   2/11 repeat evacuation / extension of decompression   OT comments  Pt making steady progress towards OT goals this session. Pt with improvements in pain mgmt and activity tolerance with pt able to complete functional mobility greater than a household distance with RW and MIN A. Pt additionally able to ambulate into bathroom this session for toileting with pt needing total A for posterior pericare in standing as pt with difficulty maintaining back precautions during pericare. Pt currently requires MIN A for UB ADLs and total A for LB ADLs. Pt would continue to benefit from skilled occupational therapy while admitted and after d/c to address the below listed limitations in order to improve overall functional mobility and facilitate independence with BADL participation. DC plan remains appropriate, will follow acutely per POC.     Follow Up Recommendations  Supervision/Assistance - 24 hour;SNF    Equipment Recommendations  3 in 1 bedside commode    Recommendations for Other Services      Precautions / Restrictions Precautions Precautions: Fall;Back;Other (comment) Precaution Booklet Issued: No Precaution Comments: reviewed precautions throughout session Restrictions Weight Bearing Restrictions: No       Mobility Bed  Mobility Overal bed mobility: Needs Assistance Bed Mobility: Rolling;Sidelying to Sit Rolling: Supervision Sidelying to sit: Supervision       General bed mobility comments: Supervision for bed mobility, able to roll towards left side using rail for support, supervision for safety and line mgmt    Transfers Overall transfer level: Needs assistance Equipment used: Rolling walker (2 wheeled) Transfers: Sit to/from Stand Sit to Stand: Supervision;Min guard         General transfer comment: supervision for sit<>stand from EOB and Min guard to rise from low toilet seat    Balance Overall balance assessment: Needs assistance Sitting-balance support: Feet supported;Bilateral upper extremity supported Sitting balance-Leahy Scale: Fair     Standing balance support: During functional activity;Single extremity supported;Bilateral upper extremity supported Standing balance-Leahy Scale: Poor Standing balance comment: at least one UE support during ADLs; BUE support during mobiltiy                           ADL either performed or assessed with clinical judgement   ADL Overall ADL's : Needs assistance/impaired                 Upper Body Dressing : Minimal assistance;Sitting Upper Body Dressing Details (indicate cue type and reason): to don posterior gown from EOB Lower Body Dressing: Total assistance;Bed level Lower Body Dressing Details (indicate cue type and reason): to don socks from bed level Toilet Transfer: Minimal assistance;RW;Ambulation;Regular Toilet;Grab bars Toilet Transfer Details (indicate cue type and reason): MIN A for functional mobility into bathroom with Rw and MIN A Toileting- Clothing Manipulation and Hygiene: Total assistance;Sit to/from stand Toileting - Clothing Manipulation Details (indicate cue type and reason): total A for  posterior pericare in order to maintain back precautions     Functional mobility during ADLs: Minimal  assistance;Rolling walker General ADL Comments: pt making excellent progress towards OT goals with pt able to complete functional mobility greater than a household distance with Rw and MIN A. pt demonstrating improvements in activity tolerance and pain mgmt with pt able to ambulate in BR with MIN A     Vision       Perception     Praxis      Cognition Arousal/Alertness: Awake/alert Behavior During Therapy: WFL for tasks assessed/performed Overall Cognitive Status: Within Functional Limits for tasks assessed                                          Exercises     Shoulder Instructions       General Comments      Pertinent Vitals/ Pain       Pain Assessment: Faces Faces Pain Scale: Hurts a little bit Pain Location: back Pain Descriptors / Indicators: Guarding;Grimacing;Spasm;Discomfort;Sore Pain Intervention(s): Limited activity within patient's tolerance;Monitored during session;Premedicated before session;Repositioned  Home Living                                          Prior Functioning/Environment              Frequency  Min 2X/week        Progress Toward Goals  OT Goals(current goals can now be found in the care plan section)  Progress towards OT goals: Progressing toward goals  Acute Rehab OT Goals Patient Stated Goal: to go home OT Goal Formulation: With patient Time For Goal Achievement: 07/09/20 Potential to Achieve Goals: Good  Plan Discharge plan remains appropriate;Frequency remains appropriate    Co-evaluation                 AM-PAC OT "6 Clicks" Daily Activity     Outcome Measure   Help from another person eating meals?: None Help from another person taking care of personal grooming?: A Little Help from another person toileting, which includes using toliet, bedpan, or urinal?: A Lot Help from another person bathing (including washing, rinsing, drying)?: A Lot Help from another person to put  on and taking off regular upper body clothing?: A Little Help from another person to put on and taking off regular lower body clothing?: Total 6 Click Score: 15    End of Session Equipment Utilized During Treatment: Gait belt;Rolling walker  OT Visit Diagnosis: Other abnormalities of gait and mobility (R26.89);Muscle weakness (generalized) (M62.81);Pain   Activity Tolerance Patient tolerated treatment well   Patient Left in chair;with call bell/phone within reach;with chair alarm set   Nurse Communication Mobility status        Time: 2671-2458 OT Time Calculation (min): 27 min  Charges: OT General Charges $OT Visit: 1 Visit OT Treatments $Self Care/Home Management : 23-37 mins  Lenor Derrick., COTA/L Acute Rehabilitation Services [phone]    Barron Schmid 06/30/2020, 12:32 PM

## 2020-06-30 NOTE — Progress Notes (Signed)
  Progress Note    06/30/2020 7:49 AM 9 Days Post-Op  Subjective:  No complaints   Vitals:   06/30/20 0333 06/30/20 0730  BP: 139/84   Pulse: 74   Resp: 18   Temp: 98.8 F (37.1 C) 97.6 F (36.4 C)  SpO2: 96%    Physical Exam: Lungs:  Non labored Extremities:  Palpable and symmetrical 2+ DP pulses Neurologic: A&O  CBC    Component Value Date/Time   WBC 5.6 06/30/2020 0500   RBC 2.70 (L) 06/30/2020 0500   HGB 7.3 (L) 06/30/2020 0500   HCT 23.6 (L) 06/30/2020 0500   PLT 197 06/30/2020 0500   MCV 87.4 06/30/2020 0500   MCH 27.0 06/30/2020 0500   MCHC 30.9 06/30/2020 0500   RDW 15.4 06/30/2020 0500   LYMPHSABS 1.8 06/12/2020 0718   MONOABS 1.1 (H) 06/12/2020 0718   EOSABS 0.0 06/12/2020 0718   BASOSABS 0.0 06/12/2020 0718    BMET    Component Value Date/Time   NA 138 06/30/2020 0500   K 3.7 06/30/2020 0500   CL 103 06/30/2020 0500   CO2 24 06/30/2020 0500   GLUCOSE 92 06/30/2020 0500   BUN 51 (H) 06/30/2020 0500   CREATININE 2.97 (H) 06/30/2020 0500   CALCIUM 8.2 (L) 06/30/2020 0500   GFRNONAA 26 (L) 06/30/2020 0500    INR    Component Value Date/Time   INR 1.2 05/29/2020 1208     Intake/Output Summary (Last 24 hours) at 06/30/2020 0749 Last data filed at 06/30/2020 0500 Gross per 24 hour  Intake 1095.45 ml  Output 4650 ml  Net -3554.55 ml     Assessment/Plan:  45 y.o. male with incompressible tibial arteries 9 Days Post-Op   Patient with well perfused BLE; 2+ symmetrical DP pulses on exam Symptoms of RLE numbness more likely related to lumbar spine Arterial duplex pending   Emilie Rutter, PA-C [facility] [phone] 06/30/2020 7:49 AM

## 2020-06-30 NOTE — Progress Notes (Signed)
Physical Therapy Treatment Patient Details Name: Steven Cortez MRN: [id number] DOB: Jan 22, 1976 Today's Date: 06/30/2020    History of Present Illness Pt is a 45 year old male with PMHx including Hep C and IVDU who presented with back pain, weakness, dyspnea, pleuritic chest pain, urinary retention, and pitting edema. 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 s/pTEE 05/28/20.   2/11 repeat evacuation / extension of decompression    PT Comments    Patient progressing slowly towards PT goals. Reports tightness and swelling in BLEs esp in thighs, RLE>LLE. Tolerated short distance ambulation with use of RW for support and multiple standing rest breaks to stretch out back. Declined further distance due to pain/tightness. Tolerated standing there ex within RW. Encouraged getting up and walking a few times daily with nursing. RN ordered geomat for chair. Pt no longer getting IV pain meds to premedicate prior to session. Will follow.    Follow Up Recommendations  Home health PT;Supervision for mobility/OOB     Equipment Recommendations  Rolling walker with 5" wheels;3in1 (PT)    Recommendations for Other Services       Precautions / Restrictions Precautions Precautions: Fall;Back;Other (comment) Precaution Booklet Issued: No Precaution Comments: reviewed precautions throughout session Restrictions Weight Bearing Restrictions: No    Mobility  Bed Mobility Overal bed mobility: Needs Assistance Bed Mobility: Rolling;Sidelying to Sit Rolling: Supervision Sidelying to sit: Supervision       General bed mobility comments: Sitting in chair upon PT arrival.    Transfers Overall transfer level: Needs  assistance Equipment used: Rolling walker (2 wheeled) Transfers: Sit to/from Stand Sit to Stand: Supervision         General transfer comment: Supervision for safety. Stood from chair x1, transferred to EOB post ambulation.  Ambulation/Gait Ambulation/Gait assistance: Supervision Gait Distance (Feet): 45 Feet Assistive device: Rolling walker (2 wheeled) Gait Pattern/deviations: Step-through pattern;Decreased stride length;Trunk flexed Gait velocity: decr Gait velocity interpretation: <1.8 ft/sec, indicate of risk for recurrent falls General Gait Details: Slow, mostly steady gait with RW for support; multiple rest breaks.   Stairs             Wheelchair Mobility    Modified Rankin (Stroke Patients Only)       Balance Overall balance assessment: Needs assistance Sitting-balance support: Feet supported;Bilateral upper extremity supported Sitting balance-Leahy Scale: Fair Sitting balance - Comments: able to sit EOB without support, props on bilat UEs   Standing balance support: During functional activity Standing balance-Leahy Scale: Poor Standing balance comment: at least one UE support during ADLs; BUE support during mobilty                            Cognition Arousal/Alertness: Awake/alert Behavior During Therapy: WFL for tasks assessed/performed Overall Cognitive Status: Within Functional Limits for tasks assessed                                        Exercises Other Exercises Other Exercises: Standing hamcurls, marching within RW x10 BLEs Other Exercises: Mini squats in RW x5    General Comments General comments (skin integrity, edema, etc.): VSS on RA.  Pertinent Vitals/Pain Pain Assessment: 0-10 Pain Score: 5  Faces Pain Scale: Hurts a little bit Pain Location: back Pain Descriptors / Indicators: Guarding;Discomfort;Sore Pain Intervention(s): Monitored during session;Repositioned;Patient requesting pain meds-RN  notified;Limited activity within patient's tolerance    Home Living                      Prior Function            PT Goals (current goals can now be found in the care plan section) Acute Rehab PT Goals Patient Stated Goal: to go home Progress towards PT goals: Progressing toward goals    Frequency    Min 3X/week      PT Plan Current plan remains appropriate    Co-evaluation              AM-PAC PT "6 Clicks" Mobility   Outcome Measure  Help needed turning from your back to your side while in a flat bed without using bedrails?: None Help needed moving from lying on your back to sitting on the side of a flat bed without using bedrails?: None Help needed moving to and from a bed to a chair (including a wheelchair)?: A Little Help needed standing up from a chair using your arms (e.g., wheelchair or bedside chair)?: A Little Help needed to walk in hospital room?: A Little Help needed climbing 3-5 steps with a railing? : A Little 6 Click Score: 20    End of Session   Activity Tolerance: Patient tolerated treatment well;Patient limited by pain Patient left: in bed;with call bell/phone within reach (sitting EOB) Nurse Communication: Mobility status PT Visit Diagnosis: Unsteadiness on feet (R26.81);Pain Pain - part of body:  (back)     Time: 4967-5916 PT Time Calculation (min) (ACUTE ONLY): 23 min  Charges:  $Gait Training: 8-22 mins $Therapeutic Exercise: 8-22 mins                     [name], PT, DPT Acute Rehabilitation Services Pager [phone] Office [phone]       [name] 06/30/2020, 1:50 PM

## 2020-07-01 LAB — CBC
HCT: 24.2 % — ABNORMAL LOW (ref 39.0–52.0)
Hemoglobin: 7.6 g/dL — ABNORMAL LOW (ref 13.0–17.0)
MCH: 27.3 pg (ref 26.0–34.0)
MCHC: 31.4 g/dL (ref 30.0–36.0)
MCV: 87.1 fL (ref 80.0–100.0)
Platelets: 219 10*3/uL (ref 150–400)
RBC: 2.78 MIL/uL — ABNORMAL LOW (ref 4.22–5.81)
RDW: 15.3 % (ref 11.5–15.5)
WBC: 6.1 10*3/uL (ref 4.0–10.5)
nRBC: 0 % (ref 0.0–0.2)

## 2020-07-01 LAB — RENAL FUNCTION PANEL
Albumin: 1.7 g/dL — ABNORMAL LOW (ref 3.5–5.0)
Anion gap: 10 (ref 5–15)
BUN: 49 mg/dL — ABNORMAL HIGH (ref 6–20)
CO2: 25 mmol/L (ref 22–32)
Calcium: 8.2 mg/dL — ABNORMAL LOW (ref 8.9–10.3)
Chloride: 104 mmol/L (ref 98–111)
Creatinine, Ser: 2.83 mg/dL — ABNORMAL HIGH (ref 0.61–1.24)
GFR, Estimated: 27 mL/min — ABNORMAL LOW (ref >60)
Glucose, Bld: 84 mg/dL (ref 70–99)
Phosphorus: 5.3 mg/dL — ABNORMAL HIGH (ref 2.5–4.6)
Potassium: 3.5 mmol/L (ref 3.5–5.1)
Sodium: 139 mmol/L (ref 135–145)

## 2020-07-01 LAB — MAGNESIUM: Magnesium: 2.2 mg/dL (ref 1.7–2.4)

## 2020-07-01 MED ORDER — HYDROMORPHONE HCL 2 MG PO TABS
2.0000 mg | ORAL_TABLET | Freq: Four times a day (QID) | ORAL | Status: AC
  Administered 2020-07-05 – 2020-07-09 (×15): 2 mg via ORAL
  Filled 2020-07-01 (×15): qty 1

## 2020-07-01 MED ORDER — HYDROMORPHONE HCL 2 MG PO TABS
4.0000 mg | ORAL_TABLET | Freq: Four times a day (QID) | ORAL | Status: AC
  Administered 2020-07-01 – 2020-07-05 (×16): 4 mg via ORAL
  Filled 2020-07-01 (×17): qty 2

## 2020-07-01 MED ORDER — POTASSIUM CHLORIDE CRYS ER 20 MEQ PO TBCR
30.0000 meq | EXTENDED_RELEASE_TABLET | Freq: Once | ORAL | Status: AC
  Administered 2020-07-01: 30 meq via ORAL
  Filled 2020-07-01: qty 1

## 2020-07-01 MED ORDER — HYDROMORPHONE HCL 2 MG PO TABS
2.0000 mg | ORAL_TABLET | Freq: Two times a day (BID) | ORAL | Status: DC
Start: 2020-07-09 — End: 2020-07-09

## 2020-07-01 NOTE — Plan of Care (Signed)
  Problem: Clinical Measurements: Goal: Ability to maintain clinical measurements within normal limits will improve Outcome: Progressing   Problem: Clinical Measurements: Goal: Will remain free from infection Outcome: Progressing   

## 2020-07-01 NOTE — Progress Notes (Addendum)
Nutrition Follow-up  DOCUMENTATION CODES:   Non-severe (moderate) malnutrition in context of chronic illness  INTERVENTION:   -Continue liberalized diet of 2 gram sodium with 1.2 L fluid restriction -Continue Ensure Enlive po BID, each supplement provides 350 kcal and 20 grams of protein -Continue Magic cup TID with meals, each supplement provides 290 kcal and 9 grams of protein -Continue MVI with minerals daily -Continue protein portions with meals  NUTRITION DIAGNOSIS:   Moderate Malnutrition related to chronic illness (IV drug abuse) as evidenced by energy intake < or equal to 75% for > or equal to 1 month,mild muscle depletion,mild fat depletion.  Ongoing; being addressed via PO intake and supplements  GOAL:   Patient will meet greater than or equal to 90% of their needs  Progressing  MONITOR:   PO intake,Supplement acceptance,Labs,Weight trends,Skin,I & O's  REASON FOR ASSESSMENT:   LOS    ASSESSMENT:   Steven Cortez is a 45 y.o. male with hx of IV drug use and untreated Hep C who was diagnosed with endocarditis and MRSA bacteremia at [facility], left AMA then presented to [facility] a few days later. Also found to have large epidural abscess, s/p laminectomy and evacuation by neurosurgery on 1/29. Symptoms and leukocytosis are improving with antibiotics.  1/30 - first HD treatment 2/01 - second HD treatment 2/03 - s/p TEE revealing large tricuspid valve vegetation, moderate to severe TR, and possible tiny PFO 2/04 - HD cath removed 2/11- s/prepeat L4-5, L5-S1 MIS laminectomies, evacuation of epiduralphlegmon 2/27 - s/p cystoscopy with bilateral retrograde pyelogram, bladder biopsy and fulguration, Foley catheter exchange  Reviewed I/O's: -820 ml x 24 hours and -19.1 L since 06/17/20  UOP: 1.3 L x 24 hours  Pt unavailable at time of visit.   Pt remains with good appetite. Noted meal completions 50-100%. Pt is consuming Ensure Enlive supplements.    Wt has been stable over the past month.  Per MD notes, plan for prolonged hospitalization for completion of IV antibiotics.   Medications reviewed and include calcium carbonate, ferrous sulfate, miralax, and senokot.   Labs reviewed: Phos: 5.3.    Diet Order:   Diet Order            Diet 2 gram sodium Room service appropriate? Yes; Fluid consistency: Thin; Fluid restriction: 1200 mL Fluid  Diet effective now                 EDUCATION NEEDS:   Education needs have been addressed  Skin:  Skin Assessment: Skin Integrity Issues: Skin Integrity Issues:: Incisions Incisions: closed back and perineum  Last BM:  06/30/20  Height:   Ht Readings from Last 1 Encounters:  06/05/20 5\' 10"  (1.778 m)    Weight:   Wt Readings from Last 1 Encounters:  07/01/20 88.9 kg    Ideal Body Weight:  75.5 kg  BMI:  Body mass index is 28.12 kg/m.  Estimated Nutritional Needs:   Kcal:  2300-2500  Protein:  135-155 grams  Fluid:  > 2 L    08/31/20, RD, LDN, CDCES Registered Dietitian II Certified Diabetes Care and Education Specialist Please refer to [facility] for RD and/or RD on-call/weekend/after hours pager

## 2020-07-01 NOTE — Progress Notes (Signed)
  HD#41 Subjective:   No acute events overnight. Today, Steven Cortez is doing well, stating his muscle spasms continue to improve and his RLE numbness remains unchanged. Denies any new or worsening back pain, leg weakness, fevers, chills, CP, or any other symptoms. Remains eager to gain his strength back and to leave the hospital in ~ 3 weeks. Notes he didn't receive IV pain medication as discussed prior to PT/OT yesterday.   Objective:  Vital signs in last 24 hours: Vitals:   07/01/20 0836 07/01/20 0900 07/01/20 1000 07/01/20 1049  BP: (!) 170/95 (!) 141/79 128/84   Pulse: 69 (!) 33 72   Resp:  13 14   Temp:    97.9 F (36.6 C)  TempSrc:    Oral  SpO2:  98% 97%   Weight:      Height:       Supplemental O2: Room Air SpO2 98%   Physical Exam:   General: Patient appears chronically ill. No acute distress. Respiratory: Breathing comfortably on room air. Cardiovascular: Telemetry shows intermittent bigeminy / NSR. 3/6 murmur heard throughout the left chest, loudest in the tricuspid and mitral regions. Bilateral lower extremity pitting edema to the knees, improving, now 1+  Musculoskeletal: Strength is equal in bilateral LE's with normal muscle bulk and tone  GU: Foley in place draining amber-colored fluid   Filed Weights   06/29/20 0007 06/30/20 0028 07/01/20 0328  Weight: 92.6 kg 93 kg 88.9 kg     Intake/Output Summary (Last 24 hours) at 07/01/2020 1431 Last data filed at 07/01/2020 1051 Gross per 24 hour  Intake 340 ml  Output 1950 ml  Net -1610 ml   Net IO Since Admission: -47,952.07 mL [07/01/20 1431]  No results for input(s): GLUCAP in the last 72 hours.   Pertinent Labs: CBC Latest Ref Rng & Units 07/01/2020 06/30/2020 06/29/2020  WBC 4.0 - 10.5 K/uL 6.1 5.6 6.0  Hemoglobin 13.0 - 17.0 g/dL 7.6(L) 7.3(L) 7.6(L)  Hematocrit 39.0 - 52.0 % 24.2(L) 23.6(L) 24.8(L)  Platelets 150 - 400 K/uL 219 197 202    CMP Latest Ref Rng & Units 07/01/2020 06/30/2020 06/29/2020  Glucose 70  - 99 mg/dL 84 92 88  BUN 6 - 20 mg/dL 49(H) 51(H) 50(H)  Creatinine 0.61 - 1.24 mg/dL 2.83(H) 2.97(H) 3.02(H)  Sodium 135 - 145 mmol/L 139 138 138  Potassium 3.5 - 5.1 mmol/L 3.5 3.7 4.2  Chloride 98 - 111 mmol/L 104 103 103  CO2 22 - 32 mmol/L 25 24 25  Calcium 8.9 - 10.3 mg/dL 8.2(L) 8.2(L) 8.0(L)  Total Protein 6.5 - 8.1 g/dL - - 6.1(L)  Total Bilirubin 0.3 - 1.2 mg/dL - - 0.6  Alkaline Phos 38 - 126 U/L - - 141(H)  AST 15 - 41 U/L - - 15  ALT 0 - 44 U/L - - 21    Imaging: VAS US ABI WITH/WO TBI  Right: Resting right ankle-brachial index indicates noncompressible right lower extremity arteries. Left: Resting left ankle-brachial index is within normal range. No evidence of significant left lower extremity arterial disease.      Assessment/Plan:   Principal Problem:   Endocarditis Active Problems:   Urinary retention   AKI (acute kidney injury) (HCC)   Hyperkalemia   Microcytic anemia   MRSA infection   Epidural abscess   Opioid use disorder   Hydronephrosis   Malnutrition of moderate degree   Antibiotic long-term use   Chronic viral hepatitis B without delta agent and without coma (HCC)     Fever   Anemia of chronic disease   Patient Summary: Steven Cortez is a 45 y.o. 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 Hospital course complicated by acute renal failure requiring HD and large epidural abscess s/p laminectomy x2 (1/29 and 2/11). Now on long course IV abx treatment.   MRSA TV Endocarditis w/ Bacteremia Complicated by Septic Emboli and Epidural Abscess s/p I&D w/ laminectomy x 2 Patient continues to be afebrile without leukocytosis on IV daptomycin via PICC line. He remains hopeful he may be discharged in ~3 weeks. ID and Pharmacy note Oritavancin x 1 followed by PO linezolid for remainder of course may be an option; however, current plan to continue IV Daptomycin until July 31, 2020 is highly preferred. -Continue daptomycin daily per ID -Continue ESR/CRP and CK weekly -Scheduled tylenol 1g q8 hours -Will decrease frequency of scheduled 4mg PO Dilaudid to q6 hours and taper slowly -Continue Dilaudid 2mg PO q4 hours PRN for breakthrough pain -IV Dilaudid PRN prior to PT/OT sessions for now -Continue Robaxin 750mg four times daily -Continue PT/OT  RLE Hypoesthesia  RLE symptoms may also be in the setting of epidural phlegmon s/p laminectomy 2/11; however, stable and not dermatomal without other systemic symptoms, so do not anticipate any new/worsening spinal infectious source. Vascular consulted due to concern for vascular occlusion; however bilateral duplex ultrasounds BLE's negative for arterial occlusive disease. -Continue to monitor  -Hold off on spinal imaging given stability / improvement in symptoms  Ventricular Bigeminy Continues to have significant bigeminy load although remains asymptomatic from this. -Continue to monitor magnesium and K+ with goal Mag > 2, K+ > 4 -Continue Coreg 25 mg twice daily -Will reach out to cardiology if patient becomes symptomatic   Acute Kidney Injury on CKD, Improving Nephrotic Range Proteinuria  Hematuria  Kidney function improved slightly today in setting of large volume auto-diuresis yesterday. Urine studies consistent with intrinsic renal dysfunction. -Continue 2 g sodium restriction and 1.2 L fluid restriction diet -Continue to trend phosphorus, improving -Avoid nephrotoxic agents when possible -30mEq KCl PO  Muscle Spasms Likely in the setting of low back surgeries.  -Continue Robaxin 750mg four times daily -Replace electrolytes as needed -Consider repeat spinal images if new/worsening symptoms  Diet: 2g Na, 1.2L fluid-restricted diet  IVF: PO intake VTE: Lovenox Code: Full PT/OT: SNF vs. Home w/ HH  Anticipated discharge to Skilled nursing facility vs. HH pending improvement with IV ABX.  Rachel  Speakman, MD Internal Medicine Resident, PGY-1 [facility]  Pager: #[phone] 2:31 PM, 07/01/2020   Please contact the on call pager after 5 pm and on weekends at [phone]. 

## 2020-07-01 NOTE — Progress Notes (Signed)
Arterial duplex negative Will sign off   Fabienne Bruns, MD [facility] [place] Office: [phone]

## 2020-07-02 DIAGNOSIS — E440 Moderate protein-calorie malnutrition: Secondary | ICD-10-CM

## 2020-07-02 LAB — RENAL FUNCTION PANEL
Albumin: 1.9 g/dL — ABNORMAL LOW (ref 3.5–5.0)
Anion gap: 9 (ref 5–15)
BUN: 47 mg/dL — ABNORMAL HIGH (ref 6–20)
CO2: 25 mmol/L (ref 22–32)
Calcium: 8.3 mg/dL — ABNORMAL LOW (ref 8.9–10.3)
Chloride: 105 mmol/L (ref 98–111)
Creatinine, Ser: 2.77 mg/dL — ABNORMAL HIGH (ref 0.61–1.24)
GFR, Estimated: 28 mL/min — ABNORMAL LOW (ref >60)
Glucose, Bld: 87 mg/dL (ref 70–99)
Phosphorus: 4.7 mg/dL — ABNORMAL HIGH (ref 2.5–4.6)
Potassium: 3.7 mmol/L (ref 3.5–5.1)
Sodium: 139 mmol/L (ref 135–145)

## 2020-07-02 LAB — CBC
HCT: 24.8 % — ABNORMAL LOW (ref 39.0–52.0)
Hemoglobin: 8 g/dL — ABNORMAL LOW (ref 13.0–17.0)
MCH: 27.8 pg (ref 26.0–34.0)
MCHC: 32.3 g/dL (ref 30.0–36.0)
MCV: 86.1 fL (ref 80.0–100.0)
Platelets: 235 10*3/uL (ref 150–400)
RBC: 2.88 MIL/uL — ABNORMAL LOW (ref 4.22–5.81)
RDW: 15.7 % — ABNORMAL HIGH (ref 11.5–15.5)
WBC: 6.8 10*3/uL (ref 4.0–10.5)
nRBC: 0 % (ref 0.0–0.2)

## 2020-07-02 LAB — C-REACTIVE PROTEIN: CRP: 5.4 mg/dL — ABNORMAL HIGH (ref <1.0)

## 2020-07-02 LAB — SEDIMENTATION RATE: Sed Rate: 102 mm/hr — ABNORMAL HIGH (ref 0–16)

## 2020-07-02 MED ORDER — POTASSIUM CHLORIDE CRYS ER 20 MEQ PO TBCR
30.0000 meq | EXTENDED_RELEASE_TABLET | Freq: Once | ORAL | Status: AC
  Administered 2020-07-02: 30 meq via ORAL
  Filled 2020-07-02: qty 1

## 2020-07-02 NOTE — Progress Notes (Signed)
Physical Therapy Treatment Patient Details Name: Steven Cortez MRN: [id number] DOB: 1975/09/27 Today's Date: 07/02/2020    History of Present Illness Pt is a 45 year old male with PMHx including Hep C and IVDU who presented with back pain, weakness, dyspnea, pleuritic chest pain, urinary retention, and pitting edema. He was recently admitted to 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 s/pTEE 05/28/20.   2/11 repeat evacuation / extension of decompression    PT Comments    Pt with increased ambulation tolerance this session, performing all mobility without physical assistance at this time. Pt does benefit from UE support of RW to maintain balance and alleviate pressure off low back. Pt performs log roll well to maintain back precautions during bed mobility. PT provides significant encouragement for multiple bouts of ambulation daily to improve activity tolerance and to reduce pain and stiffness. PT continues to recommend HHPT at the time of discharge.   Follow Up Recommendations  Home health PT;Supervision for mobility/OOB (outpatient PT if insurance status limits HHPT)     Equipment Recommendations  Rolling walker with 5" wheels;3in1 (PT)    Recommendations for Other Services       Precautions / Restrictions Precautions Precautions: Fall;Back;Other (comment) Precaution Booklet Issued: No Restrictions Weight Bearing Restrictions: No    Mobility  Bed Mobility Overal bed mobility: Needs Assistance Bed Mobility: Rolling;Sidelying to Sit Rolling: Supervision Sidelying to sit: Supervision            Transfers Overall transfer level: Needs assistance Equipment used: Rolling walker (2 wheeled) Transfers: Sit to/from  Stand Sit to Stand: Supervision            Ambulation/Gait Ambulation/Gait assistance: Supervision Gait Distance (Feet): 150 Feet Assistive device: Rolling walker (2 wheeled) Gait Pattern/deviations: Step-through pattern Gait velocity: decr Gait velocity interpretation: <1.8 ft/sec, indicate of risk for recurrent falls General Gait Details: pt with slowed step-through gait, no LOB observed   Stairs             Wheelchair Mobility    Modified Rankin (Stroke Patients Only)       Balance Overall balance assessment: Needs assistance Sitting-balance support: No upper extremity supported;Feet supported Sitting balance-Leahy Scale: Good     Standing balance support: Single extremity supported;No upper extremity supported Standing balance-Leahy Scale: Fair                              Cognition Arousal/Alertness: Awake/alert Behavior During Therapy: WFL for tasks assessed/performed Overall Cognitive Status: Within Functional Limits for tasks assessed                                        Exercises      General Comments General comments (skin integrity, edema, etc.): VSS on RA      Pertinent Vitals/Pain Pain Assessment: 0-10 Pain Score: 6  Pain Location: back Pain Descriptors / Indicators: Aching Pain Intervention(s): Premedicated before session    Home Living                      Prior Function            PT  Goals (current goals can now be found in the care plan section) Acute Rehab PT Goals Patient Stated Goal: to go home Progress towards PT goals: Progressing toward goals    Frequency    Min 3X/week      PT Plan Current plan remains appropriate    Co-evaluation              AM-PAC PT "6 Clicks" Mobility   Outcome Measure  Help needed turning from your back to your side while in a flat bed without using bedrails?: A Little Help needed moving from lying on your back to sitting on the side of  a flat bed without using bedrails?: A Little Help needed moving to and from a bed to a chair (including a wheelchair)?: A Little Help needed standing up from a chair using your arms (e.g., wheelchair or bedside chair)?: A Little Help needed to walk in hospital room?: A Little Help needed climbing 3-5 steps with a railing? : A Little 6 Click Score: 18    End of Session Equipment Utilized During Treatment: Gait belt Activity Tolerance: Patient tolerated treatment well Patient left: in chair;with call bell/phone within reach Nurse Communication: Mobility status PT Visit Diagnosis: Unsteadiness on feet (R26.81);Pain Pain - Right/Left: Right     Time: 2025-4270 PT Time Calculation (min) (ACUTE ONLY): 16 min  Charges:  $Gait Training: 8-22 mins                     Arlyss Gandy, PT, DPT Acute Rehabilitation Pager: [phone]    Arlyss Gandy 07/02/2020, 1:45 PM

## 2020-07-02 NOTE — Plan of Care (Signed)
  Problem: Nutrition: Goal: Adequate nutrition will be maintained Outcome: Progressing   Problem: Coping: Goal: Level of anxiety will decrease Outcome: Progressing   

## 2020-07-02 NOTE — Progress Notes (Addendum)
HD#42 Subjective:   No acute events overnight. Today, Steven Cortez resting in bed comfortably and states he is doing well. Able to sit up in the chair yesterday without complications. Notes that the lower leg swelling has improved and that he is able to tolerate PT better every day. Notes urine is starting to have a yellow tinge to it. When questioned regarding his thoughts on starting Suboxone prior to discharge, he states he hasn't thought about it much. Continues to wish to leave the hospital sooner than full duration of ABX therapy.   Objective:  Vital signs in last 24 hours: Vitals:   07/01/20 1800 07/01/20 1900 07/01/20 2000 07/01/20 2355  BP: 132/70 128/87 115/83 129/89  Pulse: 72 72 (!) 57 71  Resp: 14 12 14 13   Temp:   97.8 F (36.6 C) 97.6 F (36.4 C)  TempSrc:   Oral Oral  SpO2: 99% 94% 97% 97%  Weight:      Height:       Supplemental O2: Room Air SpO2 98%   Physical Exam:   General: Patient appears chronically ill. No acute distress. Respiratory: Breathing comfortably on room air. Cardiovascular: RRR. 3/6 murmur heard throughout the left chest, loudest in the tricuspid area. Mitral murmur slightly improved. 1+ pitting edema on the left and trace pitting edema of the RLE.  Musculoskeletal: Strength is equal in bilateral LE's with normal muscle bulk and tone  GU: Foley in place draining amber urine.  Filed Weights   06/29/20 0007 06/30/20 0028 07/01/20 0328  Weight: 92.6 kg 93 kg 88.9 kg     Intake/Output Summary (Last 24 hours) at 07/02/2020 6811 Last data filed at 07/01/2020 2355 Gross per 24 hour  Intake 397 ml  Output 2750 ml  Net -2353 ml   Net IO Since Admission: -49,315.07 mL [07/02/20 0652]  No results for input(s): GLUCAP in the last 72 hours.   Pertinent Labs: CBC Latest Ref Rng & Units 07/02/2020 07/01/2020 06/30/2020  WBC 4.0 - 10.5 K/uL 6.8 6.1 5.6  Hemoglobin 13.0 - 17.0 g/dL 8.0(L) 7.6(L) 7.3(L)  Hematocrit 39.0 - 52.0 % 24.8(L) 24.2(L)  23.6(L)  Platelets 150 - 400 K/uL 235 219 197    CMP Latest Ref Rng & Units 07/02/2020 07/01/2020 06/30/2020  Glucose 70 - 99 mg/dL 87 84 92  BUN 6 - 20 mg/dL 47(H) 49(H) 51(H)  Creatinine 0.61 - 1.24 mg/dL 2.77(H) 2.83(H) 2.97(H)  Sodium 135 - 145 mmol/L 139 139 138  Potassium 3.5 - 5.1 mmol/L 3.7 3.5 3.7  Chloride 98 - 111 mmol/L 105 104 103  CO2 22 - 32 mmol/L 25 25 24   Calcium 8.9 - 10.3 mg/dL 8.3(L) 8.2(L) 8.2(L)  Total Protein 6.5 - 8.1 g/dL - - -  Total Bilirubin 0.3 - 1.2 mg/dL - - -  Alkaline Phos 38 - 126 U/L - - -  AST 15 - 41 U/L - - -  ALT 0 - 44 U/L - - -    Imaging: VAS [place] ABI WITH/WO TBI  Right: Resting right ankle-brachial index indicates noncompressible right lower extremity arteries. Left: Resting left ankle-brachial index is within normal range. No evidence of significant left lower extremity arterial disease.      Assessment/Plan:   Principal Problem:   Endocarditis Active Problems:   Urinary retention   AKI (acute kidney injury) ([place])   Hyperkalemia   Microcytic anemia   MRSA infection   Epidural abscess   Opioid use disorder   Hydronephrosis   Malnutrition  of moderate degree   Antibiotic long-term use   Chronic viral hepatitis B without delta agent and without coma (HCC)   Fever   Anemia of chronic disease   Patient Summary: Steven Cortez is a 45 y.o. 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 Hospital course complicated by acute renal failure requiring HD and large epidural abscess s/p laminectomy x2 (1/29 and 2/11). Now on long course IV abx treatment.   MRSA TV Endocarditis w/ Bacteremia Complicated by Septic Emboli and Epidural Abscess s/p I&D w/ laminectomy x 2 Patient continues to be afebrile without leukocytosis on IV daptomycin via PICC line. He remains hopeful he may be discharged in ~3 weeks. ID and Pharmacy note Oritavancin x 1 followed by PO linezolid  for remainder of course may be an option; however, current plan to continue IV Daptomycin until July 31, 2020 is highly preferred. -Continue daptomycin daily per ID -Continue ESR/CRP and CK weekly -Scheduled tylenol 1g q8 hours -Continue 54m PO Dilaudid q6 hours and taper slowly -Continue Dilaudid 228mPO q4 hours PRN for breakthrough pain  -IV Dilaudid PRN prior to PT/OT sessions for now -Continue Robaxin 75071mour times daily for muscle spasms -Continue PT/OT  Acute Kidney Injury on CKD, Improving Nephrotic Range Proteinuria  Hematuria  Kidney function improved slightly today in setting of large volume auto-diuresis. Urine studies consistent with intrinsic renal dysfunction although hematuria gradually improving. -Continue 2 g sodium restriction and 1.2 L fluid restriction diet -Continue to trend phosphorus, improving -Avoid nephrotoxic agents when possible -24m52mCl PO  -Continue knee-high compression stockings as tolerated -Will decrease frequency of renal monitoring   Ventricular Bigeminy No bigeminy episodes since yesterday per telemetry.  -Continue to monitor / replace magnesium and K+ with goal Mag > 2, K+ > 4 -Continue Coreg 25 mg twice daily -Will reach out to cardiology if patient becomes symptomatic   History of IVDU - Continue ongoing discussions regarding possibility of starting suboxone prior to discharge.  Diet: 2g Na, 1.2L fluid-restricted diet  IVF: PO intake VTE: Lovenox Code: Full PT/OT: SNF vs. Home w/ HH  Terralticipated discharge to Skilled nursing facility vs. HH pTennantding improvement with IV ABX.  RachJeralyn Bennett Internal Medicine Resident, PGY-1 [facility]  Pager: #[phone] AM, 07/02/2020   Please contact the on call pager after 5 pm and on weekends at [phone]

## 2020-07-02 NOTE — Progress Notes (Signed)
S:   1) Right > Left UPJ hydro appearance without or without foley. Cysto RGP with good washout 06/21/2020. UOP 3L per day. Cr stabilized around 3. Down to 2.77 today.   2) gross hematuria - cysto, bbx (foley irritation) 06/21/2020 with malposition of balloon in urethra noted; CT, [place] benign most recent 06/18/2020. Urine clear in tubing.   3) Incomplete bladder emptying - pt with distended bladder on imaging. Continues foley catheter.   Pt making progress with ambulation. Siiting in chair, using walker, etc.   O: Vitals:   07/02/20 1800 07/02/20 1900  BP: 133/84 130/85  Pulse: (!) 36 69  Resp: 16 16  Temp:  97.8 F (36.6 C)  SpO2: 99% 97%    Intake/Output Summary (Last 24 hours) at 07/02/2020 2109 Last data filed at 07/02/2020 1807 Gross per 24 hour  Intake 1687 ml  Output 4275 ml  Net -2588 ml    Watching TV Urine clear - no clots  A/P:  CRF, urinary retention - it will be time to remove foley for another voiding trial soon. A good time for a voiding trial/foley removal would be when he is reasonably able to sit at side of bed and/or stand and use a urinal. It seems like he is getting close. If so, go ahead and remove foley Monday morning,  07/06/2020 , and let's see how he does next week with voiding, urine output and kidney function.

## 2020-07-03 MED ORDER — HYDROMORPHONE HCL 1 MG/ML IJ SOLN
0.5000 mg | Freq: Two times a day (BID) | INTRAMUSCULAR | Status: AC | PRN
  Administered 2020-07-03 – 2020-07-06 (×6): 0.5 mg via INTRAVENOUS
  Filled 2020-07-03 (×6): qty 0.5

## 2020-07-03 NOTE — Progress Notes (Signed)
OT Cancellation Note  Patient Details Name: Timouthy Gilardi MRN: [id number] DOB: November 20, 1975   Cancelled Treatment:    Reason Eval/Treat Not Completed: Pain limiting ability to participate, two attempts made. Plan to reattempt at a later time/date.  Raynald Kemp, OT Acute Rehabilitation Services Pager: [phone] Office: [phone]  07/03/2020, 12:49 PM

## 2020-07-03 NOTE — Progress Notes (Signed)
Physical Therapy Treatment Patient Details Name: Steven Cortez MRN: [id number] DOB: 07-29-1975 Today's Date: 07/03/2020    History of Present Illness Pt is a 45 year old male with PMHx including Hep C and IVDU who presented with back pain, weakness, dyspnea, pleuritic chest pain, urinary retention, and pitting edema. 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 s/pTEE 05/28/20.   2/11 repeat evacuation / extension of decompression    PT Comments    Pt tolerates treatment well after recently receiving IV pain meds. Pt is able to ambulate for increased distances and continues to mobilize at a supervision level. Pt does require cues for back precautions intermittently during bed mobility and with lower body dressing. Pt will benefit from continued acute PT POC to improve dynamic balance and to aide in a return to independence. Pt will benefit from more frequent mobilization, including attempts at mobilizing when not on IV pain meds.   Follow Up Recommendations  Home health PT;Supervision for mobility/OOB (outpatient if pt is better able to receive this due to insurance coverage)     Equipment Recommendations  Rolling walker with 5" wheels;3in1 (PT)    Recommendations for Other Services       Precautions / Restrictions Precautions Precautions: Fall;Back;Other (comment) Precaution Comments: PT verbally reviews back precautions, pt able to recall no bending and twisting but requires cues to implement during mobility Restrictions Weight Bearing Restrictions: No    Mobility  Bed Mobility Overal bed mobility: Needs Assistance Bed Mobility: Rolling;Sidelying to Sit Rolling: Supervision Sidelying to sit:  Supervision       General bed mobility comments: increased time    Transfers Overall transfer level: Needs assistance Equipment used: Rolling walker (2 wheeled) Transfers: Sit to/from Stand Sit to Stand: Supervision            Ambulation/Gait Ambulation/Gait assistance: Supervision Gait Distance (Feet): 200 Feet Assistive device: Rolling walker (2 wheeled) Gait Pattern/deviations: Step-through pattern Gait velocity: reduced Gait velocity interpretation: <1.8 ft/sec, indicate of risk for recurrent falls General Gait Details: pt with slow step-through gait, no significant gait or balance deviaitons otherwise   Stairs             Wheelchair Mobility    Modified Rankin (Stroke Patients Only)       Balance Overall balance assessment: Needs assistance Sitting-balance support: No upper extremity supported;Feet supported Sitting balance-Leahy Scale: Poor Sitting balance - Comments: reliant on LUE support, prefers L lateral lean due to pain in R lower back   Standing balance support: Single extremity supported Standing balance-Leahy Scale: Poor Standing balance comment: reliant on unilateral UE support                            Cognition Arousal/Alertness: Awake/alert Behavior During Therapy: WFL for tasks assessed/performed Overall Cognitive Status: Within Functional Limits for tasks assessed                                        Exercises General Exercises - Lower Extremity Ankle Circles/Pumps: AROM;Both;5 reps Long Arc Quad: AROM;Both;5 reps    General Comments General comments (skin integrity, edema, etc.): VSS on  RA      Pertinent Vitals/Pain Pain Assessment: Faces Faces Pain Scale: Hurts even more Pain Location: back Pain Descriptors / Indicators: Grimacing Pain Intervention(s): Premedicated before session (pt declines mobility unless receiving IV pain meds prior to therapy)    Home Living                       Prior Function            PT Goals (current goals can now be found in the care plan section) Acute Rehab PT Goals Patient Stated Goal: to go home Time For Goal Achievement: 07/17/20 Progress towards PT goals: Progressing toward goals    Frequency    Min 3X/week      PT Plan Current plan remains appropriate    Co-evaluation              AM-PAC PT "6 Clicks" Mobility   Outcome Measure  Help needed turning from your back to your side while in a flat bed without using bedrails?: A Little Help needed moving from lying on your back to sitting on the side of a flat bed without using bedrails?: A Little Help needed moving to and from a bed to a chair (including a wheelchair)?: A Little Help needed standing up from a chair using your arms (e.g., wheelchair or bedside chair)?: A Little Help needed to walk in hospital room?: A Little Help needed climbing 3-5 steps with a railing? : A Lot 6 Click Score: 17    End of Session   Activity Tolerance: Patient tolerated treatment well Patient left: in chair;with call bell/phone within reach Nurse Communication: Mobility status PT Visit Diagnosis: Unsteadiness on feet (R26.81);Pain Pain - Right/Left: Right Pain - part of body: Leg     Time: 1351-1410 PT Time Calculation (min) (ACUTE ONLY): 19 min  Charges:  $Gait Training: 8-22 mins                     Arlyss Gandy, PT, DPT Acute Rehabilitation Pager: [phone]    Arlyss Gandy 07/03/2020, 2:35 PM

## 2020-07-03 NOTE — Progress Notes (Signed)
HD#43 Subjective:   No acute events overnight. Mr. Schopf is very excited for his PT session today and cannot wait to get out of bed. He feels his lower extremity swelling feels about the same as yesterday but feels his urine is lightening up. He continues to request IV dilaudid prior to his PT sessions. Notes he would prefer to wait until he is able to move about more for voiding trial given the difficulty in placing his foley. Denies any new pain or numbness.  Objective:  Vital signs in last 24 hours: Vitals:   07/03/20 0739 07/03/20 0744 07/03/20 1034 07/03/20 1140  BP: (!) 142/98  (!) 150/88 139/82  Pulse: (!) 35 74  71  Resp: 16   15  Temp: 97.6 F (36.4 C)   97.7 F (36.5 C)  TempSrc: Oral   Oral  SpO2: 99%   98%  Weight:      Height:       Supplemental O2: Room Air SpO2 98%   Physical Exam:   General: Patient appears chronically ill. No acute distress. Respiratory: Breathing comfortably on room air. Cardiovascular: RRR. 3/6 murmur heard throughout the left chest, loudest in the tricuspid area. 2/6 systolic murmur heard over the mitral area. Edema slightly worse since yesterday, ~2+ bilaterally below the knees.  Musculoskeletal: Able to move all four extremities with normal muscle bulk.   GU: Foley in place draining yellow-orange urine.   Filed Weights   06/29/20 0007 06/30/20 0028 07/01/20 0328  Weight: 92.6 kg 93 kg 88.9 kg     Intake/Output Summary (Last 24 hours) at 07/03/2020 1532 Last data filed at 07/03/2020 1325 Gross per 24 hour  Intake 980 ml  Output 6300 ml  Net -5320 ml   Net IO Since Admission: -56,840.07 mL [07/03/20 1532]  No results for input(s): GLUCAP in the last 72 hours.   Pertinent Labs: CBC Latest Ref Rng & Units 07/02/2020 07/01/2020 06/30/2020  WBC 4.0 - 10.5 K/uL 6.8 6.1 5.6  Hemoglobin 13.0 - 17.0 g/dL 8.0(L) 7.6(L) 7.3(L)  Hematocrit 39.0 - 52.0 % 24.8(L) 24.2(L) 23.6(L)  Platelets 150 - 400 K/uL 235 219 197    CMP Latest Ref  Rng & Units 07/02/2020 07/01/2020 06/30/2020  Glucose 70 - 99 mg/dL 87 84 92  BUN 6 - 20 mg/dL 47(H) 49(H) 51(H)  Creatinine 0.61 - 1.24 mg/dL 2.77(H) 2.83(H) 2.97(H)  Sodium 135 - 145 mmol/L 139 139 138  Potassium 3.5 - 5.1 mmol/L 3.7 3.5 3.7  Chloride 98 - 111 mmol/L 105 104 103  CO2 22 - 32 mmol/L 25 25 24   Calcium 8.9 - 10.3 mg/dL 8.3(L) 8.2(L) 8.2(L)  Total Protein 6.5 - 8.1 g/dL - - -  Total Bilirubin 0.3 - 1.2 mg/dL - - -  Alkaline Phos 38 - 126 U/L - - -  AST 15 - 41 U/L - - -  ALT 0 - 44 U/L - - -    Imaging: VAS [place] ABI WITH/WO TBI  Right: Resting right ankle-brachial index indicates noncompressible right lower extremity arteries. Left: Resting left ankle-brachial index is within normal range. No evidence of significant left lower extremity arterial disease.      Assessment/Plan:   Principal Problem:   Endocarditis Active Problems:   Urinary retention   AKI (acute kidney injury) ([place])   Hyperkalemia   Microcytic anemia   MRSA infection   Epidural abscess   Opioid use disorder   Hydronephrosis   Malnutrition of moderate degree   Antibiotic  long-term use   Chronic viral hepatitis B without delta agent and without coma (HCC)   Fever   Anemia of chronic disease   Patient Summary: Steven Cortez is a 45 y.o. 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 Hospital course complicated by acute renal failure requiring HD and large epidural abscess s/p laminectomy x2 (1/29 and 2/11). Now on long course IV abx treatment.   MRSA TV Endocarditis w/ Bacteremia Complicated by Septic Emboli and Epidural Abscess s/p I&D w/ laminectomy x 2 Patient continues to be afebrile without leukocytosis on IV daptomycin via PICC line. Current plan is to continue IV Daptomycin until July 31, 2020 with discussion regarding possible PO antibiotics towards the end of the month if patient very eager to leave.   -Continue daptomycin daily per ID -Continue ESR/CRP and CK weekly -Scheduled tylenol 1g q8 hours -Continue 79m PO Dilaudid q6 hours and taper slowly -Discontinued PRN Dilaudid 240mPO given improved pain -IV Dilaudid PRN prior to PT/OT sessions for now -Continue Robaxin 75069mour times daily for muscle spasms -Continue PT/OT  Acute Kidney Injury on CKD, Improving Nephrotic Range Proteinuria  Hematuria, Improving Hematuria appears to be gradually improving with improving renal function. -Continue 2 g sodium restriction and 1.2 L fluid restriction diet -Continue to trend phosphorus -Avoid nephrotoxic agents when possible -45m49mCl PO  - Repeat potassium tomorrow  -Continue knee-high TED stockings as tolerated  Ventricular Bigeminy Significantly lower bigeminy load, likely due to improvement in vegetation on ABX. -Continue to monitor / replace magnesium and K+ with goal Mag > 2, K+ > 4 -Continue Coreg 25 mg twice daily -Will reach out to cardiology if patient becomes symptomatic   History of IVDU - Continue ongoing discussions regarding possibility of starting suboxone prior to discharge.  Diet: 2g Na, 1.2L fluid-restricted diet  IVF: PO intake VTE: Lovenox Code: Full PT/OT: SNF vs. Home w/ HH  Cordovaticipated discharge to Skilled nursing facility vs. HH pDuggerding improvement with IV ABX.  RachJeralyn Bennett Internal Medicine Resident, PGY-1 [facility]  Pager: #[phone] PM, 07/03/2020   Please contact the on call pager after 5 pm and on weekends at [phone]

## 2020-07-04 LAB — POTASSIUM: Potassium: 4.3 mmol/L (ref 3.5–5.1)

## 2020-07-04 NOTE — Plan of Care (Signed)

## 2020-07-04 NOTE — Progress Notes (Signed)
HD#44 Subjective:  Overnight Events: No overnight events.   Patient resting bed and appears comfortable. He states that he is doing well and denies any acute concerns he states that he lower extremity swelling stable and believes that the TED hose are helping to move some fluid.   He denies chest pain, shortness of breath, abdominal pain, new focal neurologic deficits.   Objective:  Vital signs in last 24 hours: Vitals:   07/03/20 2045 07/03/20 2340 07/04/20 0411 07/04/20 0752  BP: (!) 142/81 (!) 139/97 124/88 (!) 158/98  Pulse: 70 69 76 74  Resp: _0 Temp: 98 F (36.7 C) 98 F (36.7 C) 98.2 F (36.8 C) 97.7 F (36.5 C)  TempSrc: Oral Oral Oral Oral  SpO2: 98% 99% 100% 100%  Weight:   88.2 kg   Height:       Supplemental O2: Room Air SpO2: 100 % O2 Flow Rate (L/min): 2 L/min FiO2 (%): 28 %   Physical Exam:  Physical Exam Constitutional:      General: He is not in acute distress.    Appearance: He is not ill-appearing.  HENT:     Head: Normocephalic and atraumatic.     Mouth/Throat:     Mouth: Mucous membranes are moist.  Eyes:     Extraocular Movements: Extraocular movements intact.  Cardiovascular:     Rate and Rhythm: Normal rate and regular rhythm.     Pulses: Normal pulses.     Heart sounds: Normal heart sounds.  Pulmonary:     Effort: Pulmonary effort is normal.     Breath sounds: Normal breath sounds.  Abdominal:     General: There is no distension.     Palpations: Abdomen is soft.     Tenderness: There is no abdominal tenderness.  Musculoskeletal:        General: Swelling (Rt (2+) >Lt (1+) edema) present. Normal range of motion.     Cervical back: Normal range of motion.  Skin:    General: Skin is warm and dry.  Neurological:     General: No focal deficit present.     Mental Status: He is alert and oriented to person, place, and time. Mental status is at baseline.  Psychiatric:        Mood and Affect: Mood normal.     Filed  Weights   06/30/20 0028 07/01/20 0328 07/04/20 0411  Weight: 93 kg 88.9 kg 88.2 kg     Intake/Output Summary (Last 24 hours) at 07/04/2020 0858 Last data filed at 07/04/2020 0500 Gross per 24 hour  Intake 963 ml  Output 5750 ml  Net -4787 ml   Net IO Since Admission: -59,217.07 mL [07/04/20 0858]  No results for input(s): GLUCAP in the last 72 hours.   Pertinent Labs: CBC Latest Ref Rng & Units 07/02/2020 07/01/2020 06/30/2020  WBC 4.0 - 10.5 K/uL 6.8 6.1 5.6  Hemoglobin 13.0 - 17.0 g/dL 8.0(L) 7.6(L) 7.3(L)  Hematocrit 39.0 - 52.0 % 24.8(L) 24.2(L) 23.6(L)  Platelets 150 - 400 K/uL 235 219 197    CMP Latest Ref Rng & Units 07/04/2020 07/02/2020 07/01/2020  Glucose 70 - 99 mg/dL - 87 84  BUN 6 - 20 mg/dL - 47(H) 49(H)  Creatinine 0.61 - 1.24 mg/dL - 2.77(H) 2.83(H)  Sodium 135 - 145 mmol/L - 139 139  Potassium 3.5 - 5.1 mmol/L 4.3 3.7 3.5  Chloride 98 - 111 mmol/L - 105 104  CO2 22 - 32 mmol/L - 25  25  Calcium 8.9 - 10.3 mg/dL - 8.3(L) 8.2(L)  Total Protein 6.5 - 8.1 g/dL - - -  Total Bilirubin 0.3 - 1.2 mg/dL - - -  Alkaline Phos 38 - 126 U/L - - -  AST 15 - 41 U/L - - -  ALT 0 - 44 U/L - - -    Imaging: No results found.  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 Hospital course complicated by acute renal failure requiring HD and large epidural abscess s/p laminectomy x2 (1/29 and 2/11). Now on long course IV abx treatment.   MRSA  TV Endocarditis w/ Bacteremia Complicated by Septic Emboli and Epidural Abscess s/p I&D w/ laminectomy x 2 Patient remains stable. No new neurologic deficits and pain is well controled.  He is tolerating his IV antibiotics well. Currently planning to continue IV Daptomycin until July 31, 2020 per ID.  - Continue IV dapto (End date: 07/30/2020) - ESR/CRP/CK weekly - Pain regiment: APAP 1 g q8 hours (titrate to BID in the near future),Dilaudid 4 mg PO q 6 hours (will taper slowly), Dilaudid IV prn with PT - Continue Robaxin 750 mg QID for muscle spasms.  -Continue PT/OT  Acute Kidney Injury on CKD,Improving Nephrotic Range Proteinuria  Hematuria, Improving BMP pending, patient continues to have good urine output with improvement of hematuria.  -Continue 2 g sodium restriction and 1.2 L fluid restriction -Continue to monitor electrolytes -Avoid nephrotoxic medications when possible -Continue TED hose for lower extremity edema  Ventricular Bigeminy -We will continue to monitor electrolytes (Mg > 2, K > 4) -Coreg 25 mg twice daily -We will reconsult cardiology if bigeminy worsens again or if he becomes symptomatic.  History of IVDU -Asked the patient to consider Suboxone for opiate use disorder once pain medicine has been titrated down.  HTN:  Patient is hypertensive today at 142/82.  We will continue to monitor and start antihypertensive medication as needed.  Diet: 2g Na, 1.2L fluid-restricted diet  IVF: PO intake VTE: Lovenox Code: Full PT/OT: SNF vs. Home w/ [facility]  Anticipated discharge to Skilled nursing facility vs. [facility] pending improvement with IV ABX.  Lawerance Cruel, D.O.  Internal Medicine Resident, PGY-2 [facility]  Pager: [phone] 8:58 AM, 07/04/2020   Please contact the on call pager after 5 pm and on weekends at [phone].

## 2020-07-05 LAB — RENAL FUNCTION PANEL
Albumin: 2.1 g/dL — ABNORMAL LOW (ref 3.5–5.0)
Anion gap: 8 (ref 5–15)
BUN: 46 mg/dL — ABNORMAL HIGH (ref 6–20)
CO2: 25 mmol/L (ref 22–32)
Calcium: 8.2 mg/dL — ABNORMAL LOW (ref 8.9–10.3)
Chloride: 103 mmol/L (ref 98–111)
Creatinine, Ser: 2.57 mg/dL — ABNORMAL HIGH (ref 0.61–1.24)
GFR, Estimated: 31 mL/min — ABNORMAL LOW (ref >60)
Glucose, Bld: 91 mg/dL (ref 70–99)
Phosphorus: 4.9 mg/dL — ABNORMAL HIGH (ref 2.5–4.6)
Potassium: 4.1 mmol/L (ref 3.5–5.1)
Sodium: 136 mmol/L (ref 135–145)

## 2020-07-05 NOTE — Progress Notes (Signed)
HD#45 Subjective:  Overnight Events:  No overnight events.   Patient appears well resting in bed. He states that he is doing well. He denies any acute complaints.   Objective:  Vital signs in last 24 hours: Vitals:   07/04/20 1943 07/04/20 2000 07/05/20 0000 07/05/20 0400  BP: 132/86 (!) 136/94 130/80 (!) 145/88  Pulse: 67 67 67 66  Resp: 16 15 12 20   Temp: 97.6 F (36.4 C)  98.2 F (36.8 C) 98.5 F (36.9 C)  TempSrc: Oral  Oral Oral  SpO2: 99% 100% 98% 98%  Weight:   84.5 kg   Height:       Supplemental O2: Room Air SpO2: 98 % O2 Flow Rate (L/min): 2 L/min FiO2 (%): 28 %  Patient Lines/Drains/Airways Status    Active Line/Drains/Airways    Name Placement date Placement time Site Days   Peripheral IV 05/21/20 Anterior;Left Forearm 05/21/20  1427  Forearm  45   PICC Double Lumen 02/58/52 PICC Right Basilic 39 cm 0 cm 77/82/42  1304  - 33   Urethral Catheter Dr. Junious Silk Latex 16 Fr. 06/21/20  1037  Latex  14   Incision (Closed) 05/23/20 Back 05/23/20  1002  - 43   Incision (Closed) 06/05/20 Back 06/05/20  1546  - 30   Incision (Closed) 06/21/20 Perineum 06/21/20  1128  - 14          Physical Exam:  Physical Exam Constitutional:      General: He is not in acute distress.    Appearance: He is well-developed.  HENT:     Head: Normocephalic and atraumatic.     Mouth/Throat:     Mouth: Mucous membranes are moist.  Eyes:     Pupils: Pupils are equal, round, and reactive to light.  Cardiovascular:     Rate and Rhythm: Normal rate.     Pulses: Normal pulses.     Heart sounds: Normal heart sounds.  Pulmonary:     Effort: Pulmonary effort is normal. No respiratory distress.  Musculoskeletal:     Cervical back: Normal range of motion.     Right lower leg: Edema present.     Left lower leg: Edema present.  Neurological:     Mental Status: He is alert.     Filed Weights   07/01/20 0328 07/04/20 0411 07/05/20 0000  Weight: 88.9 kg 88.2 kg 84.5 kg      Intake/Output Summary (Last 24 hours) at 07/05/2020 0559 Last data filed at 07/05/2020 0431 Gross per 24 hour  Intake 727 ml  Output 2500 ml  Net -1773 ml   Net IO Since Admission: -60,990.07 mL [07/05/20 0559]  No results for input(s): GLUCAP in the last 72 hours.   Pertinent Labs: CBC Latest Ref Rng & Units 07/02/2020 07/01/2020 06/30/2020  WBC 4.0 - 10.5 K/uL 6.8 6.1 5.6  Hemoglobin 13.0 - 17.0 g/dL 8.0(L) 7.6(L) 7.3(L)  Hematocrit 39.0 - 52.0 % 24.8(L) 24.2(L) 23.6(L)  Platelets 150 - 400 K/uL 235 219 197    CMP Latest Ref Rng & Units 07/04/2020 07/02/2020 07/01/2020  Glucose 70 - 99 mg/dL - 87 84  BUN 6 - 20 mg/dL - 47(H) 49(H)  Creatinine 0.61 - 1.24 mg/dL - 2.77(H) 2.83(H)  Sodium 135 - 145 mmol/L - 139 139  Potassium 3.5 - 5.1 mmol/L 4.3 3.7 3.5  Chloride 98 - 111 mmol/L - 105 104  CO2 22 - 32 mmol/L - 25 25  Calcium 8.9 - 10.3 mg/dL - 8.3(L)  8.2(L)  Total Protein 6.5 - 8.1 g/dL - - -  Total Bilirubin 0.3 - 1.2 mg/dL - - -  Alkaline Phos 38 - 126 U/L - - -  AST 15 - 41 U/L - - -  ALT 0 - 44 U/L - - -    Imaging: No results found.  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 Hospital course complicated by acute renal failure requiring HD and large epidural abscess s/p laminectomy x2 (1/29 and 2/11). Now on long course IV abx treatment.   MRSA TV Endocarditis w/ Bacteremia Complicated by Septic Emboli and Epidural Abscess s/p  I&D w/ laminectomy x 2 Patient remained stable.  Denies any acute focal neurologic deficits or complaints -Continue IV Dapto (end date-08/02/2020) ESR/CRP/CK weekly -Continue current pain management and titrate as needed. -Continue PT/OT  Acute Kidney Injury on CKD,Improving Nephrotic Range Proteinuria  Hematuria, Improving Continues to have good urine output with evidence of hematuria in Foley bag.  We will continue to have Foley catheter in place until tolerating spontaneous voiding trials. -Continue fluid and sodium restriction -We will monitor electrolytes -Avoid nephrotoxic medications -Continue TED hose use for lower extremity edema.  Ventricular Bigeminy -Has improved  -Continue to monitor electrolytes  -Continue Coreg 25 mg twice daily  -[place] if patient has worsening bigeminy or come symptomatic.    History of IVDU - Consider suboxone for opioid management  HTN:  Remains hypertensive today we will continue to monitor  Diet: 2g Na, 1.2L fluid-restricted diet  IVF: PO intake VTE: Lovenox Code: Full PT/OT: SNF vs. Home w/ [facility]  Anticipated discharge to [place] [facility] pending improvement with IV ABX.   Anticipated discharge to Skilled nursing facility pending occlusion of antibiotics.  Lawerance Cruel, D.O.  Internal Medicine Resident, PGY-2 [facility]  Pager: [phone] 6:00 AM, 07/05/2020   Please contact the on call pager after 5 pm and on weekends at [phone].

## 2020-07-06 LAB — CBC
HCT: 23.8 % — ABNORMAL LOW (ref 39.0–52.0)
Hemoglobin: 7.6 g/dL — ABNORMAL LOW (ref 13.0–17.0)
MCH: 27.8 pg (ref 26.0–34.0)
MCHC: 31.9 g/dL (ref 30.0–36.0)
MCV: 87.2 fL (ref 80.0–100.0)
Platelets: 181 10*3/uL (ref 150–400)
RBC: 2.73 MIL/uL — ABNORMAL LOW (ref 4.22–5.81)
RDW: 16.2 % — ABNORMAL HIGH (ref 11.5–15.5)
WBC: 5.8 10*3/uL (ref 4.0–10.5)
nRBC: 0 % (ref 0.0–0.2)

## 2020-07-06 LAB — CK: Total CK: 23 U/L — ABNORMAL LOW (ref 49–397)

## 2020-07-06 NOTE — Progress Notes (Signed)
HD#46 Subjective:   Mr. Delone is sitting up in bed with PT at bedside. He endorses feeling better today and is ready to trial having his foley catheter out. States his lower extremity swelling remains about the same as yesterday.  Objective:  Vital signs in last 24 hours: Vitals:   07/06/20 0006 07/06/20 0400 07/06/20 0802 07/06/20 1046  BP: 133/84 (!) 160/97 (!) 143/80 140/87  Pulse: 74 67 70   Resp: 12 16 18    Temp: 98.5 F (36.9 C) 97.8 F (36.6 C) 98.2 F (36.8 C)   TempSrc: Oral Oral Oral   SpO2: 98% 98% 100%   Weight:  84.9 kg    Height:       Supplemental O2: Room Air SpO2 98%   Physical Exam:   General: Patient appears chronically ill. No acute distress. Respiratory: Breathing comfortably on room air. Cardiovascular: Telemetry shows NSR. Compression stockings to the knee in place bilaterally.  Musculoskeletal: Able to move all four extremities with normal muscle bulk.   GU: Foley in place draining yellow-orange urine.   Filed Weights   07/04/20 0411 07/05/20 0000 07/06/20 0400  Weight: 88.2 kg 84.5 kg 84.9 kg     Intake/Output Summary (Last 24 hours) at 07/06/2020 1210 Last data filed at 07/06/2020 1045 Gross per 24 hour  Intake 1325 ml  Output 3400 ml  Net -2075 ml   Net IO Since Admission: -63,765.07 mL [07/06/20 1210]  No results for input(s): GLUCAP in the last 72 hours.   Pertinent Labs: CBC Latest Ref Rng & Units 07/06/2020 07/02/2020 07/01/2020  WBC 4.0 - 10.5 K/uL 5.8 6.8 6.1  Hemoglobin 13.0 - 17.0 g/dL 7.6(L) 8.0(L) 7.6(L)  Hematocrit 39.0 - 52.0 % 23.8(L) 24.8(L) 24.2(L)  Platelets 150 - 400 K/uL 181 235 219    CMP Latest Ref Rng & Units 07/05/2020 07/04/2020 07/02/2020  Glucose 70 - 99 mg/dL 91 - 87  BUN 6 - 20 mg/dL 46(H) - 47(H)  Creatinine 0.61 - 1.24 mg/dL 2.57(H) - 2.77(H)  Sodium 135 - 145 mmol/L 136 - 139  Potassium 3.5 - 5.1 mmol/L 4.1 4.3 3.7  Chloride 98 - 111 mmol/L 103 - 105  CO2 22 - 32 mmol/L 25 - 25  Calcium 8.9 -  10.3 mg/dL 8.2(L) - 8.3(L)  Total Protein 6.5 - 8.1 g/dL - - -  Total Bilirubin 0.3 - 1.2 mg/dL - - -  Alkaline Phos 38 - 126 U/L - - -  AST 15 - 41 U/L - - -  ALT 0 - 44 U/L - - -    Imaging: VAS [place] ABI WITH/WO TBI  Right: Resting right ankle-brachial index indicates noncompressible right lower extremity arteries. Left: Resting left ankle-brachial index is within normal range. No evidence of significant left lower extremity arterial disease.      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 Hospital course complicated by acute renal  failure requiring HD and large epidural abscess s/p laminectomy x2 (1/29 and 2/11). Now on long course IV abx treatment.   MRSA TV Endocarditis w/ Bacteremia Complicated by Septic Emboli and Epidural Abscess s/p I&D w/ laminectomy x 2 Remains stable without any FND's or acute complaints.  -Continue IV Daptomycin (end date - 07/26/2020)  -Continue ESR/CRP and CK weekly -Scheduled tylenol 1g q8 hours -Continue tapering scheduled PO Dilaudid  -IV Dilaudid PRN prior to PT/OT  -Continue Robaxin 755m four times daily for muscle spasms -Continue PT/OT  Acute Kidney Injury on CKD, Improving Nephrotic Range Proteinuria  Hematuria, Improving Foley catheter remains in place with continued improvement in hematuria.  -Remove foley catheter -Voiding trials  -Strict I&O's -Continue 2 g sodium restriction and 1.2 L fluid  restriction diet -Avoid nephrotoxic agents when possible -Continue knee-high TED stockings as tolerated  Ventricular Bigeminy Now in NSR. -Continue to monitor / replace magnesium and K+ with goal Mag > 2, K+ > 4 -Continue Coreg 25 mg twice daily -Will reach out to cardiology if patient becomes symptomatic   History of IVDU - Continue ongoing discussions regarding possibility of starting suboxone prior to discharge.  Hypertension  Intermittent hypertension may be in the setting of pain.  - Continue to monitor   Diet: 2g Na, 1.2L fluid-restricted diet  IVF: PO intake VTE: Lovenox Code: Full PT/OT: Home w/ HH PT   Anticipated discharge to Home with home health pending improvement with IV ABX.  RJeralyn Bennett MD Internal Medicine Resident, PGY-1 [facility]  Pager: #[phone]:10 PM, 07/06/2020   Please contact the on call pager after 5 pm and on weekends at [phone]

## 2020-07-06 NOTE — Progress Notes (Signed)
Physical Therapy Treatment Patient Details Name: Steven Cortez MRN: [id number] DOB: June 06, 1975 Today's Date: 07/06/2020    History of Present Illness Pt is a 45 year old male with PMHx including Hep C and IVDU who presented with back pain, weakness, dyspnea, pleuritic chest pain, urinary retention, and pitting edema. He was recently admitted to [facility] and found to have tricuspid endocarditis with MRSA bacteremia, and pulmonary septic emboli but left AMA. 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 s/pTEE 05/28/20.   2/11 repeat evacuation / extension of decompression    PT Comments    Patient progressing well towards PT goals. Continues to need to be premedicated with IV Dilaudid prior to PT session. Today, session focused on stair training and strengthening. Pt with difficulty ascending with RLE resulting in knee instability/buckling. Improved ambulation distance with supervision for safety. Reports some stiffness from first time being up today. Will continue to work on functional mobility and stairs to help with overall strengthening and safe mobility. Encouraged getting up multiple times per day as able and increasing activity. Will follow.   Follow Up Recommendations  Home health PT;Supervision for mobility/OOB (vs OPPT if better for insurance coverage)     Equipment Recommendations  Rolling walker with 5" wheels;3in1 (PT)    Recommendations for Other Services       Precautions / Restrictions Precautions Precautions: Fall;Back Precaution Booklet Issued: No Precaution Comments: Reviewed precautions Restrictions Weight Bearing Restrictions: No    Mobility  Bed Mobility Overal bed mobility: Needs Assistance Bed Mobility: Rolling;Sidelying to Sit Rolling: Modified independent (Device/Increase time) Sidelying to sit: Modified independent  (Device/Increase time);HOB elevated       General bed mobility comments: Increased time and use of rails, no assist needed. Good demo of log roll technique.    Transfers Overall transfer level: Needs assistance Equipment used: Rolling walker (2 wheeled) Transfers: Sit to/from Stand Sit to Stand: Supervision         General transfer comment: Supervision for safety. Stood from [employer], transferred to chair post ambulation.  Ambulation/Gait Ambulation/Gait assistance: Supervision Gait Distance (Feet): 275 Feet Assistive device: Rolling walker (2 wheeled) Gait Pattern/deviations: Step-through pattern;Decreased stride length;Trunk flexed Gait velocity: reduced   General Gait Details: Slow, guarded and step through gait with reliance on UEs on RW. 2 standing rest breaks. Cues for upright posture.   Stairs Stairs: Yes Stairs assistance: Min guard Stair Management: Step to pattern;Forwards;Sideways;One rail Left;One rail Right Number of Stairs: 3 General stair comments: Initially, pt with step to pattern with 1 rail on left, leading with LLE; attempted to lead with RLE however unable due to buckling. Used BUEs on rails to descend in step to pattern.   Wheelchair Mobility    Modified Rankin (Stroke Patients Only)       Balance Overall balance assessment: Needs assistance Sitting-balance support: No upper extremity supported;Feet supported Sitting balance-Leahy Scale: Good Sitting balance - Comments: reliant on LUE support, prefers L lateral lean due to pain in R lower back   Standing balance support: During functional activity Standing balance-Leahy Scale: Poor Standing balance comment: reliant on unilateral UE support                            Cognition Arousal/Alertness: Awake/alert Behavior During Therapy: WFL for tasks assessed/performed Overall Cognitive Status: Within Functional Limits for tasks assessed  Exercises      General Comments General comments (skin integrity, edema, etc.): VSS on RA.      Pertinent Vitals/Pain Pain Assessment: Faces Faces Pain Scale: Hurts little more Pain Location: back with mobility Pain Descriptors / Indicators: Guarding;Aching Pain Intervention(s): Monitored during session;Repositioned;Premedicated before session    Home Living                      Prior Function            PT Goals (current goals can now be found in the care plan section) Progress towards PT goals: Progressing toward goals    Frequency    Min 3X/week      PT Plan Current plan remains appropriate    Co-evaluation              AM-PAC PT "6 Clicks" Mobility   Outcome Measure  Help needed turning from your back to your side while in a flat bed without using bedrails?: A Little Help needed moving from lying on your back to sitting on the side of a flat bed without using bedrails?: A Little Help needed moving to and from a bed to a chair (including a wheelchair)?: A Little Help needed standing up from a chair using your arms (e.g., wheelchair or bedside chair)?: A Little Help needed to walk in hospital room?: A Little Help needed climbing 3-5 steps with a railing? : A Little 6 Click Score: 18    End of Session Equipment Utilized During Treatment: Gait belt Activity Tolerance: Patient tolerated treatment well Patient left: in chair;with call bell/phone within reach Nurse Communication: Mobility status PT Visit Diagnosis: Unsteadiness on feet (R26.81);Pain Pain - Right/Left: Right Pain - part of body: Leg (back)     Time: 1324-4010 PT Time Calculation (min) (ACUTE ONLY): 23 min  Charges:  $Gait Training: 23-37 mins                     [name], PT, DPT Acute Rehabilitation Services Pager [phone] Office [phone]       [name] 07/06/2020, 11:40 AM

## 2020-07-06 NOTE — Progress Notes (Signed)
Patient's foley catheter removed per MD order at 1245. Patient voided 250 mL of urine at 1450. Will continue to monitor.

## 2020-07-06 NOTE — Progress Notes (Signed)
Occupational Therapy Treatment Patient Details Name: Steven Cortez MRN: [id number] DOB: 1975/07/28 Today's Date: 07/06/2020    History of present illness Pt is a 45 year old male with PMHx including Hep C and IVDU who presented with back pain, weakness, dyspnea, pleuritic chest pain, urinary retention, and pitting edema. 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 s/pTEE 05/28/20.   2/11 repeat evacuation / extension of decompression   OT comments  This 45 yo male admitted and underwent above since admission presents to acute OT with making great progress with OT (setup/S) with introduction of AE today and issuance of same. He will continue to benefit from acute OT without need for follow up.  Follow Up Recommendations  No OT follow up;Supervision/Assistance - 24 hour    Equipment Recommendations  None recommended by OT       Precautions / Restrictions Precautions Precautions: Fall;Back Restrictions Weight Bearing Restrictions: No       Mobility Bed Mobility               General bed mobility comments: Pt up in recliner upon my arrival    Transfers Overall transfer level: Needs assistance Equipment used: Rolling walker (2 wheeled) Transfers: Sit to/from Stand Sit to Stand: Supervision              Balance Overall balance assessment: Needs assistance Sitting-balance support: No upper extremity supported;Feet supported Sitting balance-Leahy Scale: Fair     Standing balance support: Single extremity supported Standing balance-Leahy Scale: Poor                             ADL either performed or assessed with clinical judgement   ADL Overall ADL's : Needs  assistance/impaired                     Lower Body Dressing: Set up;Supervision/safety;With adaptive equipment;Sit to/from stand Lower Body Dressing Details (indicate cue type and reason): doff socks with reacher, attempted to doff ted hose with dressing stick but he was unable to due to feet so swollen (could not get off over heel); able to donn ted hose and socks with wide sock aide.Educated on use of reacher to don underwear and pants               General ADL Comments: Issued pt AE (wide sock aid, reacher, dressing stick, long shoe horn, long handled sponge)               Cognition Arousal/Alertness: Awake/alert Behavior During Therapy: WFL for tasks assessed/performed Overall Cognitive Status: Within Functional Limits for tasks assessed                                                     Pertinent Vitals/ Pain       Pain Assessment: 0-10 Faces Pain Scale: Hurts little more Pain Location: back with certain movments Pain Descriptors / Indicators: Grimacing;Guarding Pain Intervention(s): Monitored during session;Limited activity within patient's tolerance         Frequency  Min 2X/week  Progress Toward Goals  OT Goals(current goals can now be found in the care plan section)  Progress towards OT goals: Progressing toward goals     Plan Discharge plan needs to be updated       AM-PAC OT "6 Clicks" Daily Activity     Outcome Measure   Help from another person eating meals?: None Help from another person taking care of personal grooming?: A Little Help from another person toileting, which includes using toliet, bedpan, or urinal?: A Little Help from another person bathing (including washing, rinsing, drying)?: A Little Help from another person to put on and taking off regular upper body clothing?: A Little Help from another person to put on and taking off regular lower body clothing?: A Little 6 Click Score: 19    End of  Session Equipment Utilized During Treatment: Rolling walker  OT Visit Diagnosis: Unsteadiness on feet (R26.81);Other abnormalities of gait and mobility (R26.89);Muscle weakness (generalized) (M62.81);Pain Pain - part of body:  (back)   Activity Tolerance Patient tolerated treatment well   Patient Left in chair;with call bell/phone within reach           Time: 8127-5170 OT Time Calculation (min): 21 min  Charges: OT General Charges $OT Visit: 1 Visit OT Treatments $Self Care/Home Management : 8-22 mins  Ignacia Palma, OTR/L Acute [employer] Pager [phone] Office [phone]    Steven Cortez 07/06/2020, 10:29 AM

## 2020-07-07 LAB — RENAL FUNCTION PANEL
Albumin: 2.2 g/dL — ABNORMAL LOW (ref 3.5–5.0)
Anion gap: 6 (ref 5–15)
BUN: 47 mg/dL — ABNORMAL HIGH (ref 6–20)
CO2: 26 mmol/L (ref 22–32)
Calcium: 8.4 mg/dL — ABNORMAL LOW (ref 8.9–10.3)
Chloride: 106 mmol/L (ref 98–111)
Creatinine, Ser: 2.48 mg/dL — ABNORMAL HIGH (ref 0.61–1.24)
GFR, Estimated: 32 mL/min — ABNORMAL LOW (ref >60)
Glucose, Bld: 82 mg/dL (ref 70–99)
Phosphorus: 4.5 mg/dL (ref 2.5–4.6)
Potassium: 4.3 mmol/L (ref 3.5–5.1)
Sodium: 138 mmol/L (ref 135–145)

## 2020-07-07 MED ORDER — HYDROMORPHONE HCL 1 MG/ML IJ SOLN
0.5000 mg | Freq: Two times a day (BID) | INTRAMUSCULAR | Status: DC | PRN
  Filled 2020-07-07: qty 0.5

## 2020-07-07 MED ORDER — HYDRALAZINE HCL 25 MG PO TABS
25.0000 mg | ORAL_TABLET | Freq: Two times a day (BID) | ORAL | Status: DC
  Administered 2020-07-07 (×2): 25 mg via ORAL
  Filled 2020-07-07 (×2): qty 1

## 2020-07-07 NOTE — Progress Notes (Signed)
Physical Therapy Treatment Patient Details Name: Steven Cortez MRN: [id number] DOB: [birth year] Today's Date: 07/07/2020    History of Present Illness Pt is a 45 year old male with PMHx including Hep C and IVDU who presented with back pain, weakness, dyspnea, pleuritic chest pain, urinary retention, and pitting edema. 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 s/pTEE 05/28/20.   2/11 repeat evacuation / extension of decompression    PT Comments    Patient progressing well towards PT goals. Motivated to mobilize and perform stairs with this PT today. Reports some soreness and stiffness from stair session yesterday. Worked on gait training without use of RW for support today. Tolerated well but needed more rest breaks due to fatigue and SOB. Worked on RLE strengthening using stair to activate glutes/quads. Encouraged walking with nursing a few times tomorrow as able. Highly motivated to return home. Wants to attempt to get outside to see mother when she visits. Will contact MD for order. Will follow.   Follow Up Recommendations  Outpatient PT;Supervision - Intermittent     Equipment Recommendations  Rolling walker with 5" wheels    Recommendations for Other Services       Precautions / Restrictions Precautions Precautions: Fall;Back Precaution Booklet Issued: No Precaution Comments: Reviewed precautions Restrictions Weight Bearing Restrictions: No    Mobility  Bed Mobility Overal bed mobility: Needs Assistance Bed Mobility: Rolling;Sidelying to Sit Rolling: Modified independent (Device/Increase time) Sidelying to sit: Modified independent (Device/Increase time);HOB elevated       General bed  mobility comments: Increased time and use of rails, no assist needed. Good demo of log roll technique.    Transfers Overall transfer level: Needs assistance Equipment used: Rolling walker (2 wheeled) Transfers: Sit to/from Stand Sit to Stand: Modified independent (Device/Increase time)         General transfer comment: Stood from EOB without difficulty, transferred to chair post ambulation.  Ambulation/Gait Ambulation/Gait assistance: Supervision Gait Distance (Feet): 275 Feet Assistive device: Rolling walker (2 wheeled) Gait Pattern/deviations: Step-through pattern;Decreased stride length;Trunk flexed Gait velocity: 1.0 ft/sec Gait velocity interpretation: <1.31 ft/sec, indicative of household ambulator General Gait Details: Slow, guarded and step through gait with less reliance on UEs on RW progressing to no RW use for last part of gait needing more standing rest breaks due to fatigue/pain. 2/4 DOE. VSS on RA.   Stairs Stairs: Yes Stairs assistance: Min assist;Min guard Stair Management: Step to pattern;One rail Right Number of Stairs: 4 General stair comments: Cues for technique/safety with BUEs on right rail for assist; Min guard to ascend in step to pattern. 2/4 DOE.   Wheelchair Mobility    Modified Rankin (Stroke Patients Only)       Balance Overall balance assessment: Needs assistance Sitting-balance support: No upper extremity supported;Feet supported Sitting balance-Leahy Scale: Good Sitting balance - Comments: reliant on LUE support, prefers L lateral lean due to pain in R lower back   Standing balance support: During functional activity Standing balance-Leahy Scale: Fair Standing balance comment: Able to progress to no UE support for short periods during dynamic tasks today.                            Cognition Arousal/Alertness: Awake/alert Behavior  During Therapy: WFL for tasks assessed/performed Overall Cognitive Status: Within Functional  Limits for tasks assessed                                        Exercises Other Exercises Other Exercises: Step ups leading with RLE x3 using BUEs to assist with ascending and therapist supporting right knee due to weakness.    General Comments General comments (skin integrity, edema, etc.): VSS on RA with some changes in rhythm- vent bigemy at times.      Pertinent Vitals/Pain Pain Assessment: 0-10 Pain Score: 6  Pain Location: RLe and back Pain Descriptors / Indicators: Sore;Aching Pain Intervention(s): Monitored during session;Repositioned;Premedicated before session    Home Living                      Prior Function            PT Goals (current goals can now be found in the care plan section) Progress towards PT goals: Progressing toward goals    Frequency    Min 3X/week      PT Plan Current plan remains appropriate    Co-evaluation              AM-PAC PT "6 Clicks" Mobility   Outcome Measure  Help needed turning from your back to your side while in a flat bed without using bedrails?: None Help needed moving from lying on your back to sitting on the side of a flat bed without using bedrails?: None Help needed moving to and from a bed to a chair (including a wheelchair)?: A Little Help needed standing up from a chair using your arms (e.g., wheelchair or bedside chair)?: A Little Help needed to walk in hospital room?: A Little Help needed climbing 3-5 steps with a railing? : A Little 6 Click Score: 20    End of Session   Activity Tolerance: Patient tolerated treatment well Patient left: in chair;with call bell/phone within reach Nurse Communication: Mobility status PT Visit Diagnosis: Unsteadiness on feet (R26.81);Pain Pain - Right/Left: Right Pain - part of body: Leg (back)     Time: 9892-1194 PT Time Calculation (min) (ACUTE ONLY): 29 min  Charges:  $Gait Training: 23-37 mins                     [name], PT,  DPT Acute Rehabilitation Services Pager [phone] Office [phone]       [name] 07/07/2020, 12:59 PM

## 2020-07-07 NOTE — Progress Notes (Signed)
  HD#47 Subjective:   Steven Cortez states that he continues to do better every day. He is able to walk a few steps with physical therapy and notes his right leg continues to be weaker than his left, although denies acute changes. He is able to urinate without difficulty and states his urine is now more yellow s/p foley removal. He says he will be able to go home to live with his brother (near [place]), who is around the house often and able to assist with ADL's  Objective:  Vital signs in last 24 hours: Vitals:   07/06/20 2000 07/07/20 0000 07/07/20 0500 07/07/20 0550  BP: (!) 141/84 (!) 144/74 (!) 149/98   Pulse: 77 78 72   Resp: 18 17 14   Temp: 98.4 F (36.9 C) 98 F (36.7 C)  98.4 F (36.9 C)  TempSrc: Oral Oral  Oral  SpO2: 100% 100% 100%   Weight:      Height:       Supplemental O2: Room Air SpO2 98%   Physical Exam:   General: Patient appears chronically ill. No acute distress. Respiratory: Breathing comfortably on room air. Cardiovascular: RRR. There is a 3/6 harsh early systolic murmur heard along the left lower sternal border and left chest. No other m/r/g. There is bilateral 1+ pitting edema to the knees.  Musculoskeletal: Able to move all four extremities with normal muscle bulk.   Psychiatric: Pleasant and cooperative.   Filed Weights   07/04/20 0411 07/05/20 0000 07/06/20 0400  Weight: 88.2 kg 84.5 kg 84.9 kg     Intake/Output Summary (Last 24 hours) at 07/07/2020 0719 Last data filed at 07/07/2020 0544 Gross per 24 hour  Intake 2000 ml  Output 4800 ml  Net -2800 ml   Net IO Since Admission: -65,815.07 mL [07/07/20 0719]  No results for input(s): GLUCAP in the last 72 hours.   Pertinent Labs: CBC Latest Ref Rng & Units 07/06/2020 07/02/2020 07/01/2020  WBC 4.0 - 10.5 K/uL 5.8 6.8 6.1  Hemoglobin 13.0 - 17.0 g/dL 7.6(L) 8.0(L) 7.6(L)  Hematocrit 39.0 - 52.0 % 23.8(L) 24.8(L) 24.2(L)  Platelets 150 - 400 K/uL 181 235 219    CMP Latest Ref Rng & Units  07/07/2020 07/05/2020 07/04/2020  Glucose 70 - 99 mg/dL 82 91 -  BUN 6 - 20 mg/dL 47(H) 46(H) -  Creatinine 0.61 - 1.24 mg/dL 2.48(H) 2.57(H) -  Sodium 135 - 145 mmol/L 138 136 -  Potassium 3.5 - 5.1 mmol/L 4.3 4.1 4.3  Chloride 98 - 111 mmol/L 106 103 -  CO2 22 - 32 mmol/L 26 25 -  Calcium 8.9 - 10.3 mg/dL 8.4(L) 8.2(L) -  Total Protein 6.5 - 8.1 g/dL - - -  Total Bilirubin 0.3 - 1.2 mg/dL - - -  Alkaline Phos 38 - 126 U/L - - -  AST 15 - 41 U/L - - -  ALT 0 - 44 U/L - - -    Imaging: VAS US ABI WITH/WO TBI  Right: Resting right ankle-brachial index indicates noncompressible right lower extremity arteries. Left: Resting left ankle-brachial index is within normal range. No evidence of significant left lower extremity arterial disease.      Assessment/Plan:   Principal Problem:   Endocarditis Active Problems:   Urinary retention   AKI (acute kidney injury) (HCC)   Hyperkalemia   Microcytic anemia   MRSA infection   Epidural abscess   Opioid use disorder   Hydronephrosis   Malnutrition of moderate degree     Antibiotic long-term use   Chronic viral hepatitis B without delta agent and without coma (HCC)   Fever   Anemia of chronic disease   Patient Summary: Steven Cortez is a 45 y.o. 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 Hospital course complicated by acute renal failure requiring HD and large epidural abscess s/p laminectomy x2 (1/29 and 2/11). Now on long course IV abx treatment.   MRSA TV Endocarditis w/ Bacteremia Complicated by Septic Emboli and Epidural Abscess s/p I&D w/ laminectomy x 2 Remains stable without any FND's or acute complaints.  -Continue IV Daptomycin (end date - 08/11/2020); will discuss early next week possibility of starting oral ABX given patient's persistent desire and motivation to return home at the end of the month -Continue ESR/CRP and CK weekly -Scheduled  tylenol 1g q8 hours -Continue tapering scheduled PO Dilaudid  -IV Dilaudid PRN prior to PT/OT  -Continue Robaxin 762m four times daily for muscle spasms -Continue PT/OT  Acute Kidney Injury on CKD, Improving Nephrotic Range Proteinuria  Hematuria, Improving Patient continues to make good urine without difficulty after foley catheter was removed yesterday. Kidney function continues to improve. Intake > 2L.  -Strict I&O's -Will need to encourage limited PO intake while kidney function continues to improve  -Continue 2 g sodium restriction and 1.2 L fluid restriction diet -Avoid nephrotoxic agents when possible -Continue knee-high TED stockings as tolerated  Ventricular Bigeminy Now in NSR. -Continue to monitor / replace magnesium and K+intermittently with goal Mag > 2, K+ > 4 -Continue Coreg 25 mg twice daily -Will reach out to cardiology if patient becomes symptomatic   History of IVDU - Continue ongoing discussions regarding possibility of starting suboxone prior to discharge.  Hypertension  Patient remains persistently mildly hypertensive. - Continue Coreg and Amlodipine  - Add Hydralazine 260mBID  - Continue to monitor   Diet: 2g Na, 1.2L fluid-restricted diet  IVF: PO intake VTE: Lovenox Code: Full PT/OT: Home w/ [id number]   Anticipated discharge to Home with home health.  RaJeralyn BennettMD Internal Medicine Resident, PGY-1 [facility]  Pager: #[phone] AM, 07/07/2020   Please contact the on call pager after 5 pm and on weekends at [phone]

## 2020-07-07 NOTE — Progress Notes (Signed)
Nutrition Follow-up  DOCUMENTATION CODES:   Non-severe (moderate) malnutrition in context of chronic illness  INTERVENTION:   -discontinue Magic Cup TID  -Snacks BID   -continue Ensure Enlive BID, each supplement provides 350 kcal, 20 grams protein   -continue MVI with minerals po daily   -continue double protein portions with meals  NUTRITION DIAGNOSIS:   Moderate Malnutrition related to chronic illness (IV drug abuse) as evidenced by energy intake < or equal to 75% for > or equal to 1 month,mild muscle depletion,mild fat depletion.  Ongoing.   GOAL:   Patient will meet greater than or equal to 90% of their needs  Progressing.  MONITOR:   PO intake,Supplement acceptance,Labs,Weight trends,Skin,I & O's  REASON FOR ASSESSMENT:   LOS    ASSESSMENT:   Steven Cortez is a 44 y.o. male with hx of IV drug use and untreated Hep C who was diagnosed with endocarditis and MRSA bacteremia at [facility], left AMA then presented to [facility] a few days later. Also found to have large epidural abscess, s/p laminectomy and evacuation by neurosurgery on 1/29. Symptoms and leukocytosis are improving with antibiotics.  Per MD notes, plan for prolonged hospitalization for completion of IV antibiotics. Notes indicate that pt is continuing to improve daily.   Per chart meal documentation, pt has been consuming 100% of meals. Pt reports that his appetite has been good and has consistently been improving. Pt reports he's been able to eat most all of his trays. Pt reports that he is enjoying all of his supplements except for the Magic Cup. Intern discussed other alternatives with pt to ensure adequate protein and calorie needs are being met to aid in his healing and pt decided he would like to have snacks instead.  Pt's weights reviewed and have shown weight fluctuations since admitted likely due to fluid. Pt denied any weight changes that he's noted the past few days other than  swelling in his lower extremities. Admit weight: 165# Current weight: 187.17#   Meds reviewed: calcium carbonate (TID), Ensure Enlive (BID), ferrous sulfate (daily), MVI (daily), Miralax (BID), Senekot (BID) Labs reviewed: Corrected Calcium (9.84), CBG (112-158)  I&O's reviewed: -34,660.6 L since admit  UOP reviewed: 4,800 mL x 24 hrs  Diet Order:   Diet Order            Diet 2 gram sodium Room service appropriate? Yes; Fluid consistency: Thin; Fluid restriction: 1200 mL Fluid  Diet effective now                 EDUCATION NEEDS:   Education needs have been addressed  Skin:  Skin Assessment: Skin Integrity Issues: Skin Integrity Issues:: Incisions Incisions: closed back x 2, closed perineum  Last BM:  3/13 (type 6)  Height:   Ht Readings from Last 1 Encounters:  06/05/20 5' 10" (1.778 m)    Weight:   Wt Readings from Last 1 Encounters:  07/06/20 84.9 kg    Ideal Body Weight:  75.5 kg  BMI:  Body mass index is 26.86 kg/m.  Estimated Nutritional Needs:   Kcal:  2300-2500  Protein:  135-155 grams  Fluid:  > 2 L    Grace Garrett, Dietetic Intern 07/07/2020 2:59 PM  

## 2020-07-08 MED ORDER — HYDRALAZINE HCL 50 MG PO TABS
50.0000 mg | ORAL_TABLET | Freq: Two times a day (BID) | ORAL | Status: DC
  Administered 2020-07-08 – 2020-07-09 (×3): 50 mg via ORAL
  Filled 2020-07-08 (×3): qty 1

## 2020-07-08 NOTE — Plan of Care (Signed)

## 2020-07-08 NOTE — Plan of Care (Signed)
  Problem: Clinical Measurements: Goal: Ability to maintain clinical measurements within normal limits will improve Outcome: Progressing   Problem: Health Behavior/Discharge Planning: Goal: Ability to manage health-related needs will improve Outcome: Progressing   Problem: Clinical Measurements: Goal: Will remain free from infection Outcome: Progressing   

## 2020-07-08 NOTE — Progress Notes (Signed)
Occupational Therapy Treatment Patient Details Name: Steven Cortez MRN: [id number] DOB: 1976-02-17 Today's Date: 07/08/2020    History of present illness Steven Cortez is a 45 year old male with PMHx including Hep C and IVDU who presented with back pain, weakness, dyspnea, pleuritic chest pain, urinary retention, and pitting edema. 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 s/pTEE 05/28/20.   2/11 repeat evacuation / extension of decompression   OT comments  Steven Cortez making steady progress towards OT goals this session. Steven Cortez continues to present with increased pain, decreased activity tolerance and impaired balance. Steven Cortez continues to endorse pain but motivated to stand and shave this session, Steven Cortez reliant on at least one UE supported during dynamic ADLs but Steven Cortez able to tolerate standing ~ 6 mins with min guard assist with RW. Steven Cortez with good carryover of how to use AE for LB Dressing. Steven Cortez would continue to benefit from skilled occupational therapy while admitted and after d/c to address the below listed limitations in order to improve overall functional mobility and facilitate independence with BADL participation. DC plan remains appropriate, will follow acutely per POC.     Follow Up Recommendations  No OT follow up;Supervision/Assistance - 24 hour    Equipment Recommendations  None recommended by OT    Recommendations for Other Services      Precautions / Restrictions Precautions Precautions: Fall;Back Precaution Booklet Issued: No Precaution Comments: Reviewed precautions Restrictions Weight Bearing Restrictions: No       Mobility Bed Mobility Overal bed mobility: Needs Assistance Bed Mobility: Rolling;Sidelying to  Sit Rolling: Modified independent (Device/Increase time) Sidelying to sit: Modified independent (Device/Increase time);HOB elevated       General bed mobility comments: Increased time and use of rails, no assist needed. Good demo of log roll technique.    Transfers Overall transfer level: Needs assistance Equipment used: Rolling walker (2 wheeled) Transfers: Sit to/from Stand Sit to Stand: Min guard         General transfer comment: min guard for safety and mostly for line mgmt    Balance Overall balance assessment: Needs assistance Sitting-balance support: No upper extremity supported;Feet supported Sitting balance-Leahy Scale: Good Sitting balance - Comments: reliant on LUE support, prefers L lateral lean due to pain in R lower back   Standing balance support: During functional activity;Single extremity supported Standing balance-Leahy Scale: Poor Standing balance comment: reliant on at least one UE support during standing ADLs                           ADL either performed or assessed with clinical judgement   ADL Overall ADL's : Needs assistance/impaired     Grooming: Min guard;Standing Grooming Details (indicate cue type and reason): Steven Cortez stood at sink ~ 6 mins for UB grooming task of shaving pts face     Lower Body Bathing: Moderate assistance;Sitting/lateral leans;With adaptive equipment Lower Body Bathing Details (indicate cue type and reason): simulated via LB Dressing with AE Upper Body Dressing : Minimal assistance;Sitting Upper Body Dressing Details (indicate cue type and reason): to don posterior gown from EOB Lower Body Dressing: Moderate assistance;Sitting/lateral leans;With adaptive equipment Lower Body Dressing Details (indicate cue type and reason): Steven Cortez able to don sock on RLE with sock  aid, COTA donned sock on LLE d/t pain, good carryover of how to use AE from previous OT session Toilet Transfer: Min IT sales professional Details  (indicate cue type and reason): simulated via functional mobility with RW         Functional mobility during ADLs: Min guard;Rolling walker General ADL Comments: Steven Cortez completing LB Dressing, standing grooming tasks and functional mobility with no more than min guard assist     Vision       Perception     Praxis      Cognition Arousal/Alertness: Awake/alert Behavior During Therapy: WFL for tasks assessed/performed Overall Cognitive Status: Within Functional Limits for tasks assessed                                          Exercises     Shoulder Instructions       General Comments      Pertinent Vitals/ Pain       Pain Assessment: Faces Faces Pain Scale: Hurts little more Pain Location: RLE Pain Descriptors / Indicators: Sore;Aching;Discomfort Pain Intervention(s): Monitored during session;Repositioned;Premedicated before session  Home Living                                          Prior Functioning/Environment              Frequency  Min 2X/week        Progress Toward Goals  OT Goals(current goals can now be found in the care plan section)  Progress towards OT goals: Progressing toward goals  Acute Rehab OT Goals Patient Stated Goal: to go home OT Goal Formulation: With patient Time For Goal Achievement: 07/09/20 (seen by OTR 3/14) Potential to Achieve Goals: Good  Plan Frequency remains appropriate;Discharge plan remains appropriate    Co-evaluation                 AM-PAC OT "6 Clicks" Daily Activity     Outcome Measure   Help from another person eating meals?: None Help from another person taking care of personal grooming?: A Little Help from another person toileting, which includes using toliet, bedpan, or urinal?: A Little Help from another person bathing (including washing, rinsing, drying)?: A Little Help from another person to put on and taking off regular upper body clothing?: None Help from  another person to put on and taking off regular lower body clothing?: A Little 6 Click Score: 20    End of Session Equipment Utilized During Treatment: Gait belt;Rolling walker  OT Visit Diagnosis: Unsteadiness on feet (R26.81);Other abnormalities of gait and mobility (R26.89);Muscle weakness (generalized) (M62.81);Pain Pain - Right/Left: Right   Activity Tolerance Patient tolerated treatment well   Patient Left in bed;with call bell/phone within reach;Other (comment) (sitting EOB)   Nurse Communication Mobility status        Time: 2706-2376 OT Time Calculation (min): 32 min  Charges: OT General Charges $OT Visit: 1 Visit OT Treatments $Self Care/Home Management : 23-37 mins  Lenor Derrick., COTA/L Acute Rehabilitation Services [phone]    Barron Schmid 07/08/2020, 3:13 PM

## 2020-07-08 NOTE — Progress Notes (Signed)
HD#48 Subjective:   Mr. Bellina continues to feel that his right leg is weaker than his left and continues to endorse right shin numbness to palpation, although denies any worsening of this since yesterday. He does note his lower right back and buttock is more sore after walking up the stairs yesterday, although denies pain with walking. Denies any bony spinal pain. States his LE swelling improves with walking, stockings and elevation. Denies any calf or foot pain, shortness of breath, light-headedness, dizziness, CP or palpitations. He remains very eager to leave the hospital in about 1 week and is interested in switching to PO antibiotics. He is interested in initiating suboxone prior to leaving the hospital with follow up at our [facility], although does note some financial and transportation concerns.   Objective:  Vital signs in last 24 hours: Vitals:   07/08/20 0000 07/08/20 0300 07/08/20 0809 07/08/20 1134  BP: (!) 157/96 (!) 158/93 (!) 143/86 131/85  Pulse: 73 69 73 (!) 47  Resp: _0 Temp: 98.2 F (36.8 C) (!) 97.5 F (36.4 C) 97.7 F (36.5 C) 98.1 F (36.7 C)  TempSrc: Oral Oral Oral Oral  SpO2: 98% 98% 99% 100%  Weight:  84.1 kg    Height:       Supplemental O2: Room Air SpO2 98%   Physical Exam:   General: Patient appears well. No acute distress. HENT: Poor dentition.  Respiratory: Lung sounds CTA, bilaterally.  Cardiovascular: Rate is normal. Patient switches between ventricular bigeminy and NSR. There is a 2/6 early systolic murmur heard along the left lower sternal border. No other m/r/g. There is bilateral 1+ pitting edema to the knees.  Neurological: Hypoesthesia of the right shin.  Musculoskeletal: Able to move all four extremities with normal muscle bulk. There is paraspinal muscular tenderness to palpation of the right para-lumbar region without swelling.   Psychiatric: Pleasant and cooperative.   Filed Weights   07/05/20 0000 07/06/20 0400  07/08/20 0300  Weight: 84.5 kg 84.9 kg 84.1 kg     Intake/Output Summary (Last 24 hours) at 07/08/2020 1134 Last data filed at 07/08/2020 1033 Gross per 24 hour  Intake 1484 ml  Output 3300 ml  Net -1816 ml   Net IO Since Admission: -67,144.07 mL [07/08/20 1134]  No results for input(s): GLUCAP in the last 72 hours.   Pertinent Labs: CBC Latest Ref Rng & Units 07/06/2020 07/02/2020 07/01/2020  WBC 4.0 - 10.5 K/uL 5.8 6.8 6.1  Hemoglobin 13.0 - 17.0 g/dL 7.6(L) 8.0(L) 7.6(L)  Hematocrit 39.0 - 52.0 % 23.8(L) 24.8(L) 24.2(L)  Platelets 150 - 400 K/uL 181 235 219    CMP Latest Ref Rng & Units 07/07/2020 07/05/2020 07/04/2020  Glucose 70 - 99 mg/dL 82 91 -  BUN 6 - 20 mg/dL 47(H) 46(H) -  Creatinine 0.61 - 1.24 mg/dL 2.48(H) 2.57(H) -  Sodium 135 - 145 mmol/L 138 136 -  Potassium 3.5 - 5.1 mmol/L 4.3 4.1 4.3  Chloride 98 - 111 mmol/L 106 103 -  CO2 22 - 32 mmol/L 26 25 -  Calcium 8.9 - 10.3 mg/dL 8.4(L) 8.2(L) -  Total Protein 6.5 - 8.1 g/dL - - -  Total Bilirubin 0.3 - 1.2 mg/dL - - -  Alkaline Phos 38 - 126 U/L - - -  AST 15 - 41 U/L - - -  ALT 0 - 44 U/L - - -    Imaging: VAS [place] ABI WITH/WO TBI  Right: Resting right ankle-brachial  index indicates noncompressible right lower extremity arteries. Left: Resting left ankle-brachial index is within normal range. No evidence of significant left lower extremity arterial disease.      Assessment/Plan:   Principal Problem:   Endocarditis Active Problems:   Urinary retention   AKI (acute kidney injury) ([place])   Hyperkalemia   Microcytic anemia   MRSA infection   Epidural abscess   Opioid use disorder   Hydronephrosis   Malnutrition of moderate degree   Antibiotic long-term use   Chronic viral hepatitis B without delta agent and without coma (HCC)   Fever   Anemia of chronic disease   Patient Summary: Steven Cortez is a 45 y.o. male with hx of IVDU, HTN and untreated hep C who presented for an evaluation of tricuspid valve  endocarditis and MRSA bacteremia. Hospital course complicated by acute renal failure requiring HD and large epidural abscess s/p laminectomy x2 (1/29 and 2/11). Now on long course IV abx treatment.   MRSA TV Endocarditis w/ Bacteremia Complicated by Septic Emboli and Epidural Abscess s/p I&D w/ laminectomy x 2 Remains stable without any FND's or acute complaints.  -Continue IV Daptomycin (end date - 08/07/2020); will discuss PO ABX options with ID  -Continue ESR/CRP and CK weekly  -Scheduled tylenol 1g q8 hours -Continue tapering scheduled PO Dilaudid  -IV Dilaudid PRN prior to PT/OT  -Continue Robaxin 760m four times daily for muscle spasms -Continue PT/OT  Acute Kidney Injury on CKD Nephrotic Range Proteinuria  Hematuria Patient continues to make good urine output since yesterday without foley. -Strict I&O's -Increase intake from 1.2 to 1.5L daily  -Avoid nephrotoxic agents when possible -Continue knee-high TED stockings as tolerated -Will check renal function and urine protein:creatinine tomorrow  Ventricular Bigeminy Patient continues to have intermittent asymptomatic ventricular bigeminy. TR and MR murmurs continue to improve.  -Continue to monitor / replace magnesium and K+intermittently with goal Mag > 2, K+ > 4 -Continue Coreg 25 mg twice daily -Will reach out to cardiology if patient becomes symptomatic   History of IVDU Mr. HMarchettiis interested in starting suboxone prior to discharge with follow up outpatient in our [facility]. He does mention some transportation and financial concerns and is currently uninsured.  -Will continue to wean off opioids  -Plan to initiate suboxone prior to discharge  Hypertension  Patient remains persistently hypertensive despite addition of low dose hydralazine BID. - Continue Coreg and Amlodipine  - Increase hydralazine to 518mand increase frequency to TID if response is appropriate  - Continue to monitor   Diet: 2g Na, 1.5L  fluid-restricted diet  IVF: PO intake VTE: Lovenox Code: Full PT/OT: Home w/ HHLa [place]   Anticipated discharge to home with home health.  RaJeralyn BennettMD Internal Medicine Resident, PGY-1 [facility]  Pager: #[phone]:34 AM, 07/08/2020   Please contact the on call pager after 5 pm and on weekends at [phone]

## 2020-07-09 ENCOUNTER — Inpatient Hospital Stay (HOSPITAL_COMMUNITY): Payer: Medicaid Other

## 2020-07-09 LAB — CBC
HCT: 25 % — ABNORMAL LOW (ref 39.0–52.0)
Hemoglobin: 8 g/dL — ABNORMAL LOW (ref 13.0–17.0)
MCH: 28 pg (ref 26.0–34.0)
MCHC: 32 g/dL (ref 30.0–36.0)
MCV: 87.4 fL (ref 80.0–100.0)
Platelets: 187 10*3/uL (ref 150–400)
RBC: 2.86 MIL/uL — ABNORMAL LOW (ref 4.22–5.81)
RDW: 17 % — ABNORMAL HIGH (ref 11.5–15.5)
WBC: 6.3 10*3/uL (ref 4.0–10.5)
nRBC: 0 % (ref 0.0–0.2)

## 2020-07-09 LAB — C-REACTIVE PROTEIN: CRP: 2.9 mg/dL — ABNORMAL HIGH (ref <1.0)

## 2020-07-09 LAB — RENAL FUNCTION PANEL
Albumin: 2.4 g/dL — ABNORMAL LOW (ref 3.5–5.0)
Anion gap: 11 (ref 5–15)
BUN: 53 mg/dL — ABNORMAL HIGH (ref 6–20)
CO2: 23 mmol/L (ref 22–32)
Calcium: 8.4 mg/dL — ABNORMAL LOW (ref 8.9–10.3)
Chloride: 106 mmol/L (ref 98–111)
Creatinine, Ser: 2.7 mg/dL — ABNORMAL HIGH (ref 0.61–1.24)
GFR, Estimated: 29 mL/min — ABNORMAL LOW (ref >60)
Glucose, Bld: 83 mg/dL (ref 70–99)
Phosphorus: 4.8 mg/dL — ABNORMAL HIGH (ref 2.5–4.6)
Potassium: 4.3 mmol/L (ref 3.5–5.1)
Sodium: 140 mmol/L (ref 135–145)

## 2020-07-09 LAB — MAGNESIUM: Magnesium: 2.2 mg/dL (ref 1.7–2.4)

## 2020-07-09 LAB — PROTEIN / CREATININE RATIO, URINE
Creatinine, Urine: 32.25 mg/dL
Protein Creatinine Ratio: 10.67 mg/mg{Cre} — ABNORMAL HIGH (ref 0.00–0.15)
Total Protein, Urine: 344 mg/dL

## 2020-07-09 LAB — SEDIMENTATION RATE: Sed Rate: 95 mm/hr — ABNORMAL HIGH (ref 0–16)

## 2020-07-09 IMAGING — MR MR LUMBAR SPINE W/O CM
4 of 7 series · 24 of 48 positions shown · non-contrast
Comparison: Previous MRI from [DATE].

CLINICAL DATA: Follow-up examination for lumbar spinal infection,
new right lower extremity radicular pain, evaluate for recurrent
epidural abscess.

EXAM:
MRI LUMBAR SPINE WITHOUT CONTRAST
TECHNIQUE: Multiplanar, multisequence MR imaging of the lumbar spine was
performed. No intravenous contrast was administered.

[Series 5: T2 · sagittal · 4.0mm · 0.73mm/px · 5 of 17 slices shown (1 of 2)]
[im 1/17]
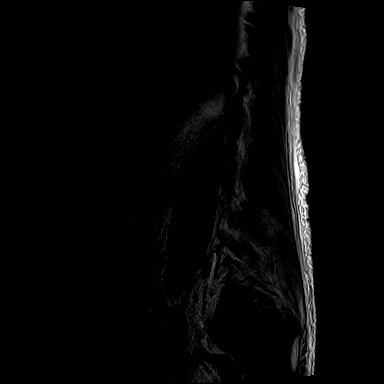
[im 5/17]
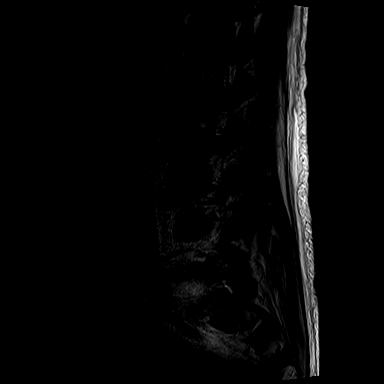
[im 9/17]
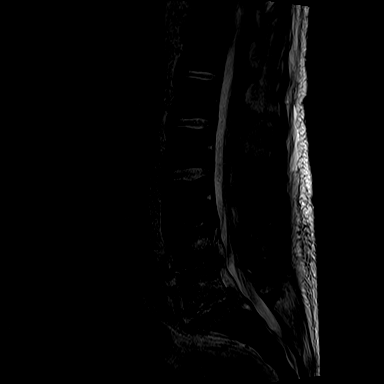
[im 13/17]
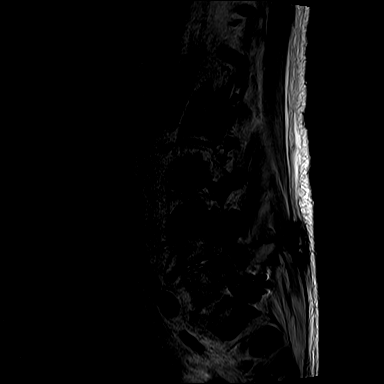
[im 17/17]
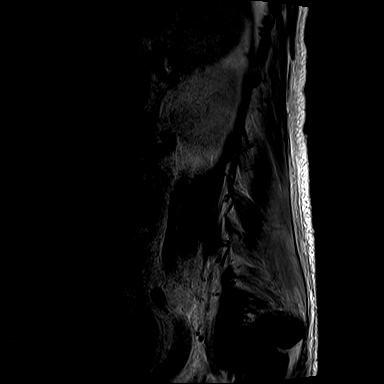

[Series 7: T1 · sagittal · 4.0mm · 0.88mm/px · 6 of 17 slices shown (1 of 2)]
[im 1/17]
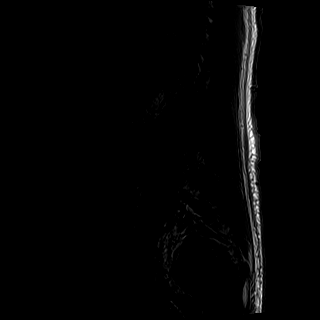
[im 4/17]
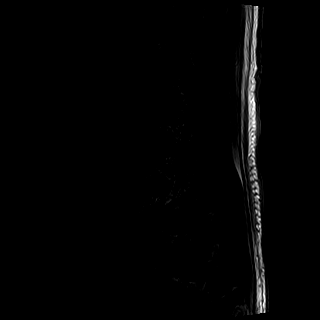
[im 7/17]
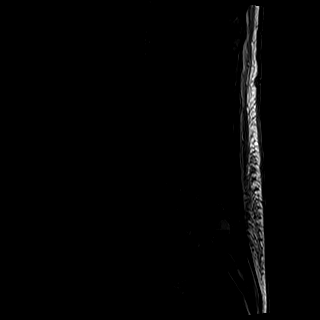
[im 10/17]
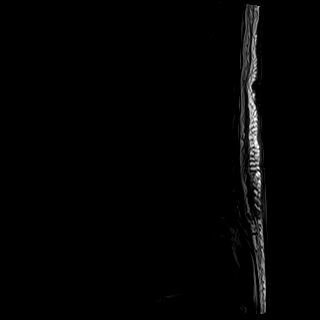
[im 13/17]
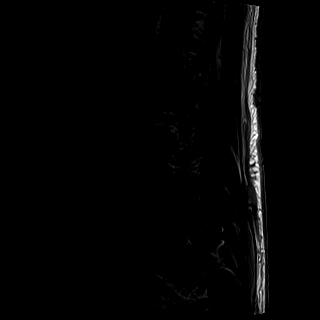
[im 17/17]
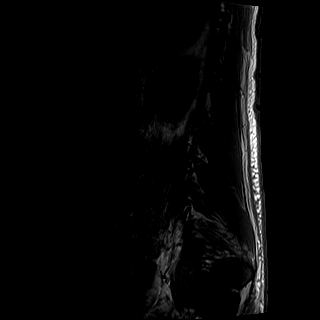

[Series 9: T2 · axial · 4.0mm · 0.57mm/px · z∈[-112,+82]mm · 9 of 36 slices shown (2 of 2)]
[im 1/36]
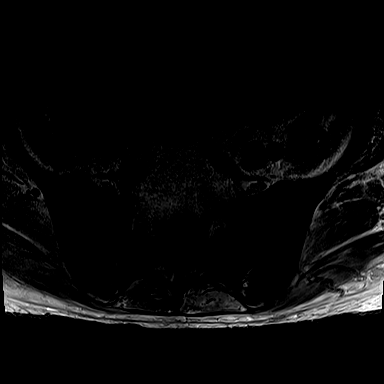
[im 7/36]
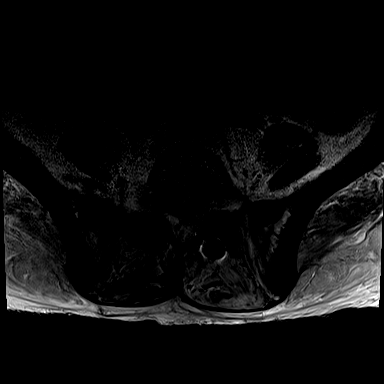
[im 10/36]
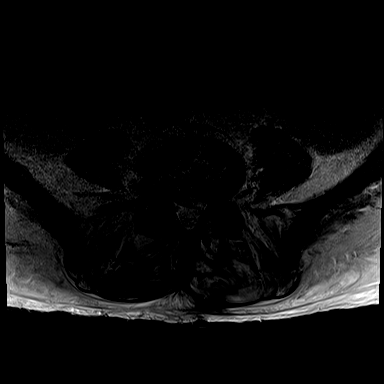
[im 16/36]
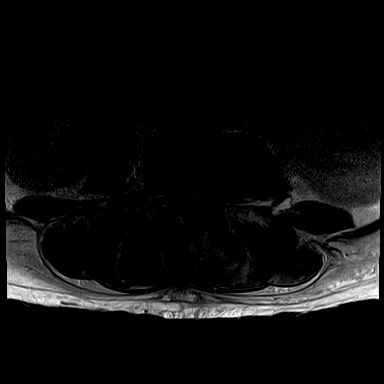
[im 20/36]
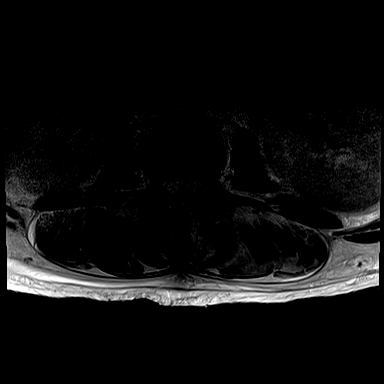
[im 26/36]
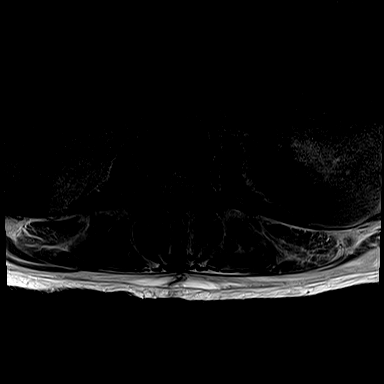
[im 29/36]
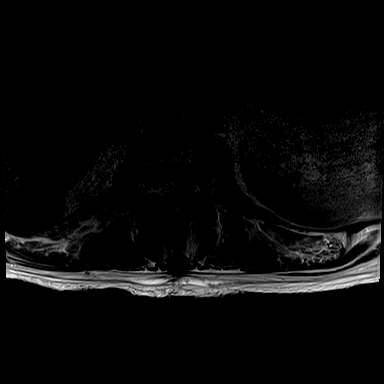
[im 32/36]
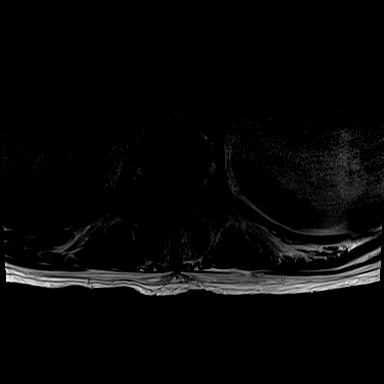
[im 36/36]
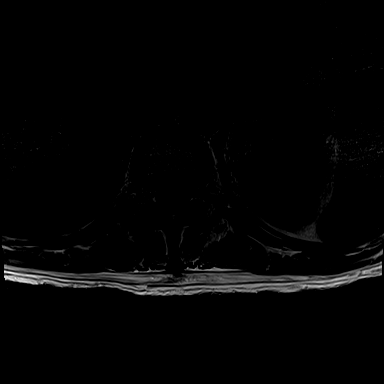

[Series 10: T1 · axial · 4.0mm · 0.34mm/px · z∈[-133,-48]mm · 4 of 22 slices shown (2 of 2)]
[im 1/22]
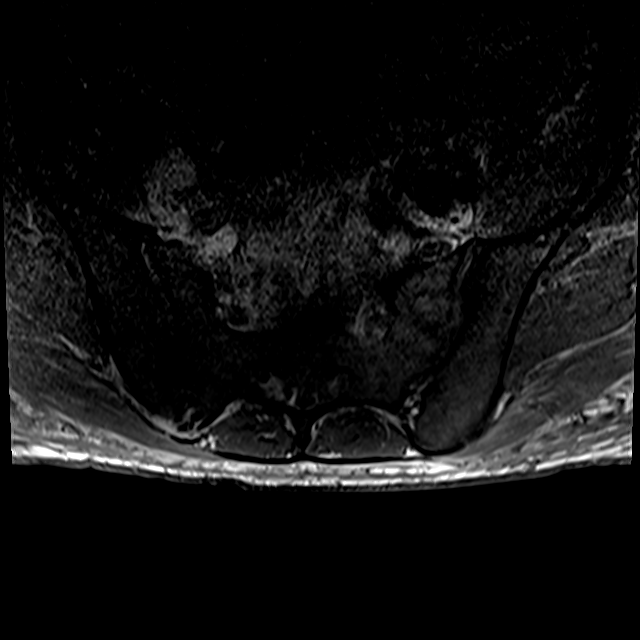
[im 4/22]
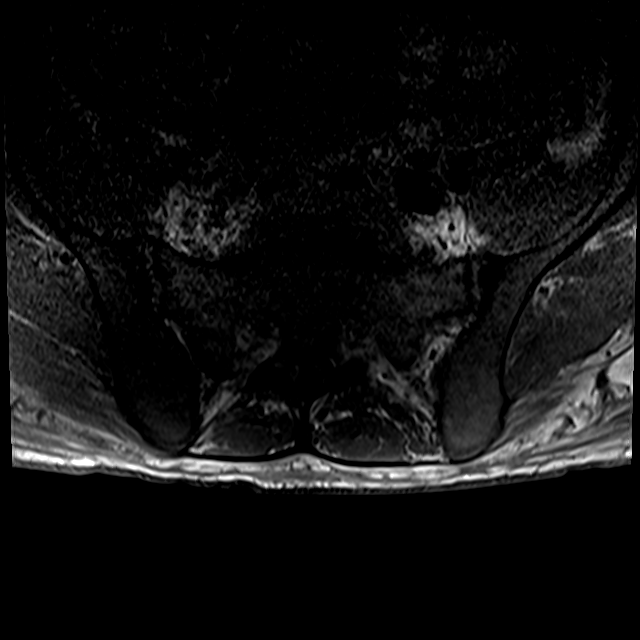
[im 11/22]
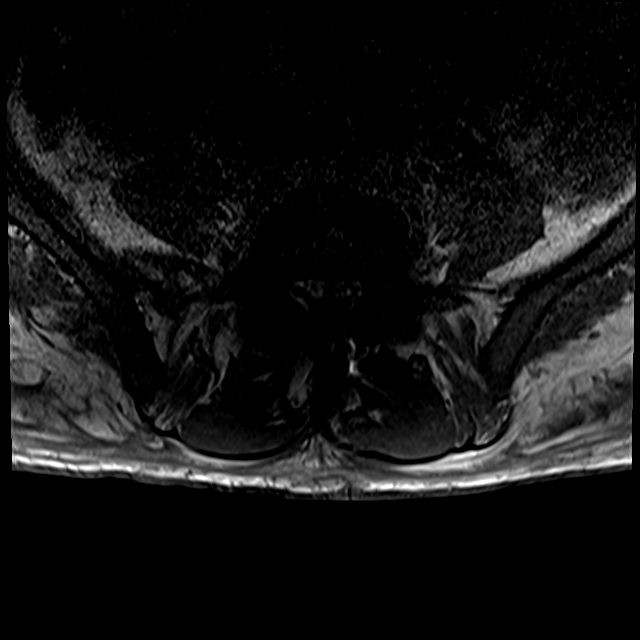
[im 18/22]
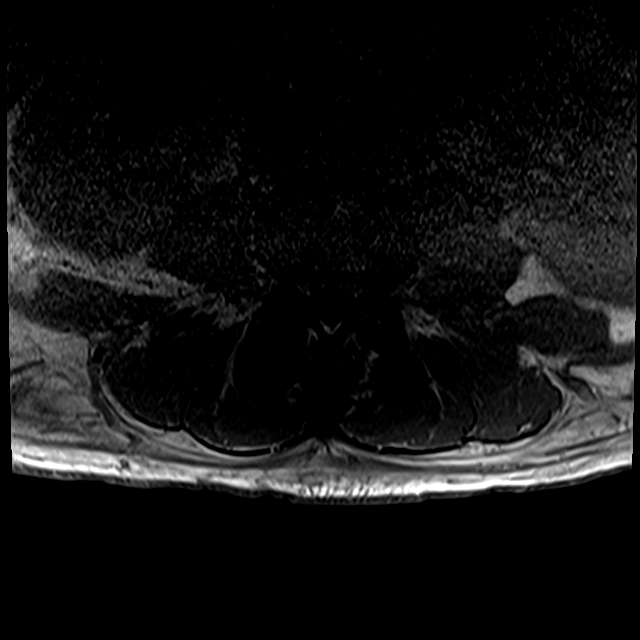

[24 of 48 positions shown; findings below may reference images not displayed]

FINDINGS: Segmentation: Standard. Lowest well-formed disc space labeled the
L5-S1 level.

Alignment: Mild levoscoliosis. Alignment otherwise normal with
preservation of the normal lumbar lordosis. No interval listhesis.

Vertebrae: Persistent findings of osteomyelitis discitis at L4-5 and
L5-S1. Changes of septic arthritis involving the L4-5 and L5-S1
facets again noted as well, most pronounced at the right L4-5 facet.
Overall, appearance is felt to be improved as compared to previous
exam. Sequelae of prior decompressive laminectomy again noted at
L4-5 and L5-S1 on the left. Tiny 1 cm fluid collection seen along
the left lower midline incision at the level of L4-5 (series 9,
image 25). No definite collections at the deeper aspects of the
laminectomy sites. Additionally, previously seen epidural
phlegmon/collection is improved as compared to previous exam.
Possible mild residual epidural phlegmon noted within the ventral
epidural space at L4-5 and L5-S1. No frank epidural abscess.
Similarly, previously seen anterior paraspinous collections or now
no longer clearly visualized, also improved. No other new sites of
infection elsewhere within the lumbar spine.

Vertebral body height maintained without acute or interval fracture.
Bone marrow signal intensity diffusely decreased on T1 weighted
imaging, nonspecific, but most commonly related to anemia, smoking,
or obesity. No worrisome osseous lesions.

Conus medullaris and cauda equina: Conus extends to the T12 level.
Conus and cauda equina appear normal.

Paraspinal and other soft tissues: Postoperative changes noted
within the lower posterior paraspinous soft tissues associated edema
has decreased and improved, which could reflect resolving
postoperative change and/or improving infection. Again, previously
noted anterior paraspinous collections are not clearly visible on
today's exam. Visualized visceral structures grossly within normal
limits.

Disc levels:

T11-12: Disc bulge with disc desiccation and intervertebral disc
space narrowing. Mild reactive endplate change. Mild spinal
stenosis. No significant spinal stenosis. Mild bilateral foraminal
narrowing.

T12-L1: Unremarkable.

L1-2:  Unremarkable.

L2-3:  Unremarkable.

L3-4: Degenerative intervertebral disc space narrowing with diffuse
disc bulge and disc desiccation. Previously seen epidural collection
is improved with only trace residual epidural phlegmon now visible.
Improved spinal stenosis. Mild to moderate left with mild right L3
foraminal narrowing, stable.

L4-5: Changes of osteomyelitis discitis and septic arthritis.
Residual mild epidural phlegmon within the ventral epidural space,
slightly asymmetric to the left (series 9, image 27). Improved
thecal sac patency from prior without significant residual spinal
stenosis. Moderate to severe right worse than left L4 foraminal
stenosis is relatively stable.

L5-S1: Changes of osteomyelitis discitis. Improved epidural phlegmon
as compared to prior with slightly improved thecal sac patency.
Mild-to-moderate bilateral L5 foraminal narrowing is relatively
stable.
IMPRESSION: 1. Persistent findings of osteomyelitis discitis and septic
arthritis at L4-5 and L5-S1, with sequelae of prior decompressive
laminectomy at L4-5 and L5-S1. Overall, appearance is improved as
compared to previous exam with only mild residual epidural phlegmon
at the L4-5 and L5-S1 levels. No frank epidural abscess or
collection now seen. Improved spinal stenosis at L3-4 through L5-S1.
2. Small 1 cm superficial collection along the left paramidline
surgical incision as above. Previously seen additional soft tissue
paraspinous collections are no longer clearly visualized.
3. No other new or progressive finding within the lumbar spine.

## 2020-07-09 MED ORDER — HYDROMORPHONE HCL 2 MG PO TABS
2.0000 mg | ORAL_TABLET | Freq: Four times a day (QID) | ORAL | Status: DC
  Administered 2020-07-09: 2 mg via ORAL
  Filled 2020-07-09: qty 1

## 2020-07-09 MED ORDER — HYDROMORPHONE HCL 1 MG/ML IJ SOLN
2.0000 mg | Freq: Once | INTRAMUSCULAR | Status: AC
Start: 2020-07-09 — End: 2020-07-09
  Administered 2020-07-09: 2 mg via INTRAVENOUS
  Filled 2020-07-09: qty 2

## 2020-07-09 MED ORDER — HYDRALAZINE HCL 50 MG PO TABS
50.0000 mg | ORAL_TABLET | Freq: Three times a day (TID) | ORAL | Status: DC
  Administered 2020-07-09 – 2020-07-15 (×18): 50 mg via ORAL
  Filled 2020-07-09 (×18): qty 1

## 2020-07-09 MED ORDER — HYDROMORPHONE HCL 1 MG/ML IJ SOLN
0.5000 mg | INTRAMUSCULAR | Status: DC | PRN
  Administered 2020-07-09: 1 mg via INTRAVENOUS
  Administered 2020-07-10: 0.5 mg via INTRAVENOUS
  Filled 2020-07-09: qty 1
  Filled 2020-07-09: qty 0.5

## 2020-07-09 MED ORDER — HYDROMORPHONE HCL 2 MG PO TABS
2.0000 mg | ORAL_TABLET | Freq: Three times a day (TID) | ORAL | Status: DC
  Administered 2020-07-09 – 2020-07-10 (×4): 2 mg via ORAL
  Filled 2020-07-09 (×4): qty 1

## 2020-07-09 MED ORDER — HYDROMORPHONE HCL 2 MG PO TABS
1.0000 mg | ORAL_TABLET | ORAL | Status: DC | PRN
  Administered 2020-07-09 – 2020-07-11 (×2): 1 mg via ORAL
  Filled 2020-07-09: qty 1

## 2020-07-09 MED ORDER — HYDRALAZINE HCL 50 MG PO TABS: 50.0000 mg | ORAL_TABLET | Freq: Four times a day (QID) | ORAL | Status: DC

## 2020-07-09 NOTE — Progress Notes (Signed)
HD#49 Subjective:   Steven Cortez states that he has had severe pain radiating from his right lower back down the side of his right lower extremity since yesterday evening. He continues to feel numbness along the lateral aspect of his right lower leg that remains unchanged. He has not been able to move as much since yesterday although denies any known trauma or moment he can pinpoint when his pain started. Denies any obvious weakness of his right leg. He notes his lower back is sore on both sides, worse with movement. Denies CP, palpitations, shortness of breath or any other symptoms.   Objective:  Vital signs in last 24 hours: Vitals:   07/08/20 2300 07/08/20 2320 07/09/20 0000 07/09/20 0615  BP:  118/77  (!) 154/93  Pulse:    76  Resp: 14 15 16 16   Temp:  98.1 F (36.7 C)  98.1 F (36.7 C)  TempSrc:  Oral  Oral  SpO2: 99% 100% 99% 97%  Weight:    81.7 kg  Height:       Supplemental O2: Room Air SpO2 98%   Physical Exam:   General: Patient appears chronically ill and uncomfortable, in mild acute distress. HENT: Poor dentition.  Respiratory: Breathing is slightly noisy although lung sounds CTA, bilaterally.  Cardiovascular: Rate is normal. Patient switches between ventricular bigeminy and NSR. There is a 3/6 harsh systolic murmur heard along the left lower sternal border. 2/6 mitral regurgitant murmur present. There is trace edema of the RLE and 1-2+ pitting edema of the LLE to the knee.  Neurological: Hypoesthesia to light touch present on the right lateral lower leg. Sensation otherwise intact.  Musculoskeletal: There is significant tenderness to palpation over the lumbar spine and paraspinal musculature, especially overlying the right alar region. There is swelling that extends ~1" circumferentially around lumbar surgical incision site as well as an area of swelling without erythema over the right paraspinal musculature. Able to move all four extremities although with pain.   GU: No foley in place. There is bilateral CVA tenderness vs. Muscular flank tenderness to palpation. Skin: No erythema or drainage. Lumbar surgical lesion appears to be healing well. Psychiatric: Pleasant and cooperative.   Filed Weights   07/06/20 0400 07/08/20 0300 07/09/20 0615  Weight: 84.9 kg 84.1 kg 81.7 kg     Intake/Output Summary (Last 24 hours) at 07/09/2020 0709 Last data filed at 07/09/2020 7253 Gross per 24 hour  Intake 1420 ml  Output 3675 ml  Net -2255 ml   Net IO Since Admission: -66,440.07 mL [07/09/20 0709]  No results for input(s): GLUCAP in the last 72 hours.   Pertinent Labs: CBC Latest Ref Rng & Units 07/06/2020 07/02/2020 07/01/2020  WBC 4.0 - 10.5 K/uL 5.8 6.8 6.1  Hemoglobin 13.0 - 17.0 g/dL 7.6(L) 8.0(L) 7.6(L)  Hematocrit 39.0 - 52.0 % 23.8(L) 24.8(L) 24.2(L)  Platelets 150 - 400 K/uL 181 235 219    CMP Latest Ref Rng & Units 07/07/2020 07/05/2020 07/04/2020  Glucose 70 - 99 mg/dL 82 91 -  BUN 6 - 20 mg/dL 47(H) 46(H) -  Creatinine 0.61 - 1.24 mg/dL 2.48(H) 2.57(H) -  Sodium 135 - 145 mmol/L 138 136 -  Potassium 3.5 - 5.1 mmol/L 4.3 4.1 4.3  Chloride 98 - 111 mmol/L 106 103 -  CO2 22 - 32 mmol/L 26 25 -  Calcium 8.9 - 10.3 mg/dL 8.4(L) 8.2(L) -  Total Protein 6.5 - 8.1 g/dL - - -  Total Bilirubin 0.3 - 1.2  mg/dL - - -  Alkaline Phos 38 - 126 U/L - - -  AST 15 - 41 U/L - - -  ALT 0 - 44 U/L - - -    Imaging: VAS [place] ABI WITH/WO TBI  Right: Resting right ankle-brachial index indicates noncompressible right lower extremity arteries. Left: Resting left ankle-brachial index is within normal range. No evidence of significant left lower extremity arterial disease.      Assessment/Plan:   Principal Problem:   Endocarditis Active Problems:   Urinary retention   AKI (acute kidney injury) ([facility])   Hyperkalemia   Microcytic anemia   MRSA infection   Epidural abscess   Opioid use disorder   Hydronephrosis   Malnutrition of moderate degree    Antibiotic long-term use   Chronic viral hepatitis B without delta agent and without coma (HCC)   Fever   Anemia of chronic disease   Patient Summary: Steven Cortez is a 45 y.o. 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 Hospital course complicated by acute renal failure requiring HD and large epidural abscess s/p laminectomy x2 (1/29 and 2/11). Now on long course IV abx treatment.   MRSA TV Endocarditis w/ Bacteremia Complicated by Septic Emboli and Epidural Abscess s/p I&D w/ laminectomy x 2 Steven Cortez continues to experience persistent right lateral lower leg hypoesthesia and new, severe radicular pain extending from his lower lumbar region down his lateral thigh. He has noted weakness of his RLE compared to his LLE although today appears to be in significantly more discomfort. Concern for recurrent/worsening spinal / paraspinal infectious source. Remains afebrile without leukocytosis with improving CRP. -Continue IV Daptomycin (end date - 07/24/2020) -Check lumbar spinal MR wo contrast  -Continue ESR/CRP and CK weekly  -Scheduled tylenol 1g q8 hours -Will give IV vs. PO dilaudid as needed; hold taper until pain is under better control -Continue IV Dilaudid PRN prior to PT/OT  -Continue Robaxin 736m four times daily for muscle spasms -Continue PT/OT  Acute Kidney Injury on CKD Nephrotic Range Proteinuria, Worsening   Hematuria Patient continues to make good urine output since yesterday without foley. However, he does have increase in creatinine from 2.48 to 2.7 today with worsening protein:Cr of 10.67. He does have significant tenderness to palpation over his bilateral flanks, concerning for possible hydronephrosis in setting of phimosis with recent B/L hydro on renal ultrasound; however, denies troubles with urination. Albumin continues to improve. -Will check bladder scan to r/o urinary retention   -If bladder scan negative, will remove fluid restriction  -Strict I&O's -Avoid nephrotoxic agents when possible -Continue knee-high TED stockings as tolerated -Continue to monitor daily renal function for now  Ventricular Bigeminy Patient continues to have intermittent asymptomatic ventricular bigeminy. -Continue to monitor / replace magnesium and K+intermittently with goal Mag > 2, K+ > 4 -Continue Coreg 25 mg twice daily -Will reach out to cardiology if patient becomes symptomatic   History of IVDU Mr. HHalliseyis interested in starting suboxone prior to discharge with follow up outpatient in our OUD clinic. He does mention some transportation and financial concerns and is currently uninsured.  -Will continue to wean off opioids once acute pain is further evaluated   -Plan to initiate suboxone prior to discharge  Hypertension  Patient continues to have hypertension prior to hydralazine doses.  - Continue Coreg and Amlodipine  - Increase hydralazine to 556mTID   - Continue to monitor  Diet: 2g Na, 1.5L fluid-restricted diet  IVF: PO intake VTE: Lovenox Code: Full PT/OT: Home w/ HH PT   Anticipated discharge to home with home health.  Jeralyn Bennett, MD Internal Medicine Resident, PGY-1 [facility]  Pager: [phone] 7:09 AM, 07/09/2020   Please contact the on call pager after 5 pm and on weekends at [phone].

## 2020-07-09 NOTE — Progress Notes (Signed)
Physical Therapy Treatment Patient Details Name: Steven Cortez MRN: [id number] DOB: 19-Jun-1975 Today's Date: 07/09/2020    History of Present Illness Pt is a 45 y.o. male recently admitted to [facility] and found to have tricuspid endocarditis, MRSA bacteremia, septic PE, but left AMA; now admitted 05/21/20 with wweakness, SOB, chest pain, edema. 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 S/p laminectomy L4-S1 and evacuation of abscess on 1/29. S/p TEE 2/3. S/p repeat evacuation/extension of decompression on 2/11. PMH includes Hep C, IVDU, tobacco use.   PT Comments    Pt progressing well with mobility. Today's session focused on continued gait and stair training; pt's stability and gait mechanics improving with supervision for safety. Pt awaiting MRI this afternoon due to worsening RLE pain. Will continue to follow acutely.    Follow Up Recommendations  Outpatient PT;Supervision - Intermittent     Equipment Recommendations  Rolling walker with 5" wheels    Recommendations for Other Services       Precautions / Restrictions Precautions Precautions: Fall;Back Precaution Comments: New R-side sciatic-type pain awaiting MRI 3/17 Restrictions Weight Bearing Restrictions: No    Mobility  Bed Mobility               General bed mobility comments: Received sitting in recliner    Transfers Overall transfer level: Modified independent Equipment used: Rolling walker (2 wheeled);None Transfers: Sit to/from Stand           General transfer comment: Multiple sit<>stands from recliner with and without RW  Ambulation/Gait Ambulation/Gait assistance: Supervision Gait Distance (Feet): 300 Feet Assistive device: Rolling walker (2 wheeled) Gait Pattern/deviations: Step-through pattern;Decreased stride length;Trunk flexed     General Gait Details: PT requesting use of RW for ambulation; slow, antalgic gait with RW and supervision for  safety; pt initially with very minimal use of RW, becoming more reliant on it with fatigue post-stair training; DOE 2/4   Stairs   Stairs assistance: Min guard Stair Management: Step to pattern;One rail Right;Forwards;Sideways Number of Stairs: 6 General stair comments: Ascend/descend 2 steps 3x trials with single and BUE support on R-side rail; pt with good recall of correct technique from prior session. Pt trialled leading with LLE as well as RLE; improved stability ascending with LLE   Wheelchair Mobility    Modified Rankin (Stroke Patients Only)       Balance Overall balance assessment: Needs assistance Sitting-balance support: No upper extremity supported;Feet supported Sitting balance-Leahy Scale: Good       Standing balance-Leahy Scale: Fair Standing balance comment: Can static stand at sink and to don mask without UE support; dynamic stability improved with UE support                            Cognition Arousal/Alertness: Awake/alert Behavior During Therapy: WFL for tasks assessed/performed Overall Cognitive Status: Within Functional Limits for tasks assessed                                 General Comments: Some decreased attention noted, likely presenting near baseline cognition      Exercises      General Comments General comments (skin integrity, edema, etc.): HR 76, SpO2 100% on RA. Discussed smoking cessation, pt open to consider but unsure he wants to quit      Pertinent Vitals/Pain Pain Assessment: Faces Faces Pain Scale: Hurts little more Pain Location: RLE  Pain Descriptors / Indicators: Sore;Aching;Discomfort Pain Intervention(s): Monitored during session    Home Living                      Prior Function            PT Goals (current goals can now be found in the care plan section) Progress towards PT goals: Progressing toward goals    Frequency    Min 3X/week      PT Plan Current plan remains  appropriate    Co-evaluation              AM-PAC PT "6 Clicks" Mobility   Outcome Measure  Help needed turning from your back to your side while in a flat bed without using bedrails?: None Help needed moving from lying on your back to sitting on the side of a flat bed without using bedrails?: None Help needed moving to and from a bed to a chair (including a wheelchair)?: None Help needed standing up from a chair using your arms (e.g., wheelchair or bedside chair)?: None Help needed to walk in hospital room?: A Little Help needed climbing 3-5 steps with a railing? : A Little 6 Click Score: 22    End of Session Equipment Utilized During Treatment: Gait belt Activity Tolerance: Patient tolerated treatment well Patient left: in chair;with call bell/phone within reach Nurse Communication: Mobility status PT Visit Diagnosis: Unsteadiness on feet (R26.81);Pain Pain - Right/Left: Right Pain - part of body: Leg     Time: 2694-8546 PT Time Calculation (min) (ACUTE ONLY): 25 min  Charges:  $Gait Training: 23-37 mins                     Ina Homes, PT, DPT Acute Rehabilitation Services  Pager [phone] Office [phone]  Malachy Chamber 07/09/2020, 2:21 PM

## 2020-07-10 LAB — BASIC METABOLIC PANEL
Anion gap: 9 (ref 5–15)
BUN: 51 mg/dL — ABNORMAL HIGH (ref 6–20)
CO2: 24 mmol/L (ref 22–32)
Calcium: 8.5 mg/dL — ABNORMAL LOW (ref 8.9–10.3)
Chloride: 107 mmol/L (ref 98–111)
Creatinine, Ser: 2.42 mg/dL — ABNORMAL HIGH (ref 0.61–1.24)
GFR, Estimated: 33 mL/min — ABNORMAL LOW (ref >60)
Glucose, Bld: 81 mg/dL (ref 70–99)
Potassium: 4.5 mmol/L (ref 3.5–5.1)
Sodium: 140 mmol/L (ref 135–145)

## 2020-07-10 MED ORDER — GABAPENTIN 600 MG PO TABS
300.0000 mg | ORAL_TABLET | Freq: Three times a day (TID) | ORAL | Status: DC
  Administered 2020-07-10 – 2020-07-11 (×4): 300 mg via ORAL
  Filled 2020-07-10 (×4): qty 1

## 2020-07-10 MED ORDER — DICLOFENAC SODIUM 1 % EX GEL
4.0000 g | Freq: Four times a day (QID) | CUTANEOUS | Status: DC
  Administered 2020-07-10 – 2020-07-15 (×15): 4 g via TOPICAL
  Filled 2020-07-10: qty 100

## 2020-07-10 NOTE — Progress Notes (Signed)
  HD#50 Subjective:   Steven Cortez was walking the halls with therapy when we initially went to go see him. He remains very motivated to work with PT/OT. His pain has improved significantly since last night although he notes his muscles tense up following his therapy. He was excited his diet was liberated and continues to deny any other symptoms other than persistent right lower leg numbness and bilateral LE swelling.   Objective:  Vital signs in last 24 hours: Vitals:   07/10/20 0500 07/10/20 0600 07/10/20 0734 07/10/20 0735  BP:   (!) 149/101   Pulse:   85 85  Resp: 15 13 14   Temp:   97.9 F (36.6 C)   TempSrc:   Oral   SpO2: 97% 98% 100%   Weight:      Height:       Supplemental O2: Room Air SpO2 98%   Physical Exam:   General: Patient appears chronically ill although is resting comfortably, in no acute distress. HENT: Poor dentition. Moist mucus membranes.  Respiratory: Lungs CTA bilaterally without wheezes, rales or rhonchi.  Cardiovascular: Rate is normal. There is a 3/6 harsh systolic murmur heard along the left lower sternal border. 3/6 mitral regurgitant murmur present. There is 2+ bilateral lower extremity pitting edema.  Musculoskeletal: There is significant, diffuse tenderness to palpation over the lower half of the back. Patient has an antalgic gait with decreased lordosis of the lumbar spine but otherwise walk well with therapy without troubles.  Skin: There is mild peeling over the bilateral shins although no erythema. Surgical lesion continues to heal well. Psychiatric: Pleasant and cooperative.   Filed Weights   07/08/20 0300 07/09/20 0615 07/10/20 0206  Weight: 84.1 kg 81.7 kg 83.4 kg     Intake/Output Summary (Last 24 hours) at 07/10/2020 1023 Last data filed at 07/10/2020 0859 Gross per 24 hour  Intake 1475 ml  Output 2275 ml  Net -800 ml   Net IO Since Admission: -69,757.07 mL [07/10/20 1023]  No results for input(s): GLUCAP in the last 72 hours.    Pertinent Labs: CBC Latest Ref Rng & Units 07/09/2020 07/06/2020 07/02/2020  WBC 4.0 - 10.5 K/uL 6.3 5.8 6.8  Hemoglobin 13.0 - 17.0 g/dL 8.0(L) 7.6(L) 8.0(L)  Hematocrit 39.0 - 52.0 % 25.0(L) 23.8(L) 24.8(L)  Platelets 150 - 400 K/uL 187 181 235    CMP Latest Ref Rng & Units 07/10/2020 07/09/2020 07/07/2020  Glucose 70 - 99 mg/dL 81 83 82  BUN 6 - 20 mg/dL 51(H) 53(H) 47(H)  Creatinine 0.61 - 1.24 mg/dL 2.42(H) 2.70(H) 2.48(H)  Sodium 135 - 145 mmol/L 140 140 138  Potassium 3.5 - 5.1 mmol/L 4.5 4.3 4.3  Chloride 98 - 111 mmol/L 107 106 106  CO2 22 - 32 mmol/L 24 23 26  Calcium 8.9 - 10.3 mg/dL 8.5(L) 8.4(L) 8.4(L)  Total Protein 6.5 - 8.1 g/dL - - -  Total Bilirubin 0.3 - 1.2 mg/dL - - -  Alkaline Phos 38 - 126 U/L - - -  AST 15 - 41 U/L - - -  ALT 0 - 44 U/L - - -    Imaging: VAS [place] ABI WITH/WO TBI  Right: Resting right ankle-brachial index indicates noncompressible right lower extremity arteries. Left: Resting left ankle-brachial index is within normal range. No evidence of significant left lower extremity arterial disease.      Assessment/Plan:   Principal Problem:   Endocarditis Active Problems:   Urinary retention   AKI (acute kidney   injury) (HCC)   Hyperkalemia   Microcytic anemia   MRSA infection   Epidural abscess   Opioid use disorder   Hydronephrosis   Malnutrition of moderate degree   Antibiotic long-term use   Chronic viral hepatitis B without delta agent and without coma (HCC)   Fever   Anemia of chronic disease   Patient Summary: Steven Cortez is a 45 y.o. 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 Hospital course complicated by acute renal failure requiring HD and large epidural abscess s/p laminectomy x2 (1/29 and 2/11). Now on long course IV abx treatment.   MRSA TV Endocarditis w/ Bacteremia Complicated by Septic Emboli and Epidural Abscess s/p I&D w/  laminectomy x 2 Low back pain and right leg pain has significantly improved since last night. Lumbar MRI showed a new 1cm fluid collection along the left lower aspect of the surgical incision over L4-L5 with persistent L4-S1 osteodiscitis and septic arthritis of the L4-S1 facet joints (worst over the right L4-L5 vertebra which likely explains his radicular symptoms). He does continue to have a possible mild residual epidural phlegmon within the ventral epidural space between L4-S1 but his infection has improved significantly on IV Daptomycin.  -Continue IV Daptomycin (end date - 08/03/2020) -Will consider switching to PO ABX towards the end of the month -Continue ESR/CRP and CK weekly  -Scheduled tylenol 1g q8 hours -Continue PO Dilaudid 2mg q8 hours today; hopefully taper tomorrow  -Continue PO Dilaudid 1mg q4 hours PRN  -Start Voltaren gel 4mg QID  -Start gabapentin 300mg TID  -Continue IV Dilaudid PRN prior to PT/OT for now -Continue Robaxin 750mg four times daily -Continue PT/OT  Acute Kidney Injury on CKD Nephrotic Range Proteinuria, Worsening   Hematuria Creatinine improved from 2.7 to 2.42 since yesterday after fluid restriction was lifted. May have pre-renal component. Electrolytes remain stable. Continues to deny troubles with urinating, although bladder scans and I&O are not consistent with urinary retention.  -D/C bladder scans  -Strict I&O's -Avoid nephrotoxic agents as able -Continue knee-high TED stockings as tolerated -Continue to monitor daily renal function   Ventricular Bigeminy Intermittent, stable. -Continue to monitor / replace magnesium and K+intermittently with goal Mag > 2, K+ > 4 -Continue Coreg 25 mg twice daily -Will reach out to cardiology if patient becomes symptomatic   History of IVDU Steven Cortez is interested in starting suboxone prior to discharge with follow up outpatient in our OUD clinic. He does mention some transportation and financial concerns and is  currently uninsured.  -Will continue to wean off opioids once acute pain is well controlled   -Plan to initiate suboxone prior to discharge  Hypertension  Patient continues to have hypertension although hydralazine was increased to TID last night. - Continue Coreg and Amlodipine  - Continue hydralazine to 50mg TID   - Continue to monitor   Diet: 2g Na without Fluid Restriction  IVF: PO intake VTE: Lovenox Code: Full PT/OT: Home w/ HH PT   Anticipated discharge to home with home health.  Rachel Speakman, MD Internal Medicine Resident, PGY-1 [facility]  Pager: #[phone] 10:23 AM, 07/10/2020   Please contact the on call pager after 5 pm and on weekends at [phone]. 

## 2020-07-10 NOTE — Plan of Care (Signed)
  Problem: Activity: Goal: Risk for activity intolerance will decrease Outcome: Progressing   Problem: Nutrition: Goal: Adequate nutrition will be maintained Outcome: Progressing   Problem: Coping: Goal: Level of anxiety will decrease Outcome: Progressing   

## 2020-07-10 NOTE — Progress Notes (Signed)
Occupational Therapy Treatment Patient Details Name: Steven Cortez MRN: [id number] DOB: 11/28/1975 Today's Date: 07/10/2020    History of present illness Pt is a 45 y.o. 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 S/p laminectomy L4-S1 and evacuation of abscess on 1/29. S/p TEE 2/3. S/p repeat evacuation/extension of decompression on 2/11. PMH includes Hep C, IVDU, tobacco use.   OT comments  Pt making steady progress towards OT goals this session. Pt continues to present with increased pain but making good progress towards increasing activity tolerance with pt able to complete IADL task of gathering and transporting IADL items with min guard assist as precursor to higher level functional mobility tasks. Hooked up reacher to pts RW as pt reports difficulty maintaining "no bending" restriction with pt very pleased with set- up of AE. Pt would continue to benefit from skilled occupational therapy while admitted and after d/c to address the below listed limitations in order to improve overall functional mobility and facilitate independence with BADL participation. DC plan remains appropriate, will follow acutely per POC.     Follow Up Recommendations  No OT follow up;Supervision/Assistance - 24 hour    Equipment Recommendations  None recommended by OT    Recommendations for Other Services      Precautions / Restrictions Precautions Precautions: Fall;Back Precaution Booklet Issued: No Precaution Comments: reviewed back precautions during session Restrictions Weight Bearing Restrictions: No       Mobility Bed Mobility               General bed mobility comments: Received sitting in recliner and returned to recliner at end of session    Transfers Overall  transfer level: Needs assistance Equipment used: Rolling walker (2 wheeled)   Sit to Stand: Min guard         General transfer comment: min guard for safety from recliner    Balance Overall balance assessment: Needs assistance Sitting-balance support: No upper extremity supported;Feet supported Sitting balance-Leahy Scale: Good     Standing balance support: During functional activity;Single extremity supported Standing balance-Leahy Scale: Fair Standing balance comment: Can static stand to don mask without UE support; dynamic stability improved with UE support                           ADL either performed or assessed with clinical judgement   ADL Overall ADL's : Needs assistance/impaired                         Toilet Transfer: Min IT sales professional Details (indicate cue type and reason): simulated via functional mobility with RW         Functional mobility during ADLs: Min guard;Rolling walker General ADL Comments: pt working towards IADL participation with pt able to transport items during ambulation. pt continues to c/o pain but motivated to increase activity tolerance,education provided on using reacher to avoid bending, hooked up reacher to pts RW     Vision       Perception     Praxis      Cognition Arousal/Alertness: Awake/alert Behavior During Therapy: WFL for tasks assessed/performed Overall Cognitive Status: Within Functional Limits for tasks assessed  Exercises     Shoulder Instructions       General Comments VSS, set- up reacher on pts walker to assist with compensation for "no bending"    Pertinent Vitals/ Pain       Pain Assessment: Faces Faces Pain Scale: Hurts little more Pain Location: back Pain Descriptors / Indicators: Sore;Aching;Discomfort Pain Intervention(s): Monitored during session;Repositioned;Patient requesting pain meds-RN  notified  Home Living                                          Prior Functioning/Environment              Frequency  Min 2X/week        Progress Toward Goals  OT Goals(current goals can now be found in the care plan section)  Progress towards OT goals: Progressing toward goals  Acute Rehab OT Goals Patient Stated Goal: to go home OT Goal Formulation: With patient Time For Goal Achievement: 07/20/20 (seen by OTR 3/14) Potential to Achieve Goals: Good  Plan Frequency remains appropriate;Discharge plan remains appropriate    Co-evaluation                 AM-PAC OT "6 Clicks" Daily Activity     Outcome Measure   Help from another person eating meals?: None Help from another person taking care of personal grooming?: A Little Help from another person toileting, which includes using toliet, bedpan, or urinal?: A Little Help from another person bathing (including washing, rinsing, drying)?: A Little Help from another person to put on and taking off regular upper body clothing?: None Help from another person to put on and taking off regular lower body clothing?: A Little 6 Click Score: 20    End of Session Equipment Utilized During Treatment: Gait belt;Rolling walker  OT Visit Diagnosis: Unsteadiness on feet (R26.81);Other abnormalities of gait and mobility (R26.89);Muscle weakness (generalized) (M62.81);Pain Pain - part of body:  (back)   Activity Tolerance Patient tolerated treatment well   Patient Left in chair;with call bell/phone within reach   Nurse Communication Mobility status        Time: 0920-0939 OT Time Calculation (min): 19 min  Charges: OT General Charges $OT Visit: 1 Visit OT Treatments $Self Care/Home Management : 8-22 mins Lenor Derrick., COTA/L Acute Rehabilitation Services [phone]     Barron Schmid 07/10/2020, 11:29 AM

## 2020-07-11 LAB — RENAL FUNCTION PANEL
Albumin: 2.3 g/dL — ABNORMAL LOW (ref 3.5–5.0)
Anion gap: 5 (ref 5–15)
BUN: 53 mg/dL — ABNORMAL HIGH (ref 6–20)
CO2: 25 mmol/L (ref 22–32)
Calcium: 8.3 mg/dL — ABNORMAL LOW (ref 8.9–10.3)
Chloride: 109 mmol/L (ref 98–111)
Creatinine, Ser: 2.56 mg/dL — ABNORMAL HIGH (ref 0.61–1.24)
GFR, Estimated: 31 mL/min — ABNORMAL LOW (ref >60)
Glucose, Bld: 88 mg/dL (ref 70–99)
Phosphorus: 5.1 mg/dL — ABNORMAL HIGH (ref 2.5–4.6)
Potassium: 4.6 mmol/L (ref 3.5–5.1)
Sodium: 139 mmol/L (ref 135–145)

## 2020-07-11 MED ORDER — HYDROMORPHONE HCL 2 MG PO TABS
1.0000 mg | ORAL_TABLET | Freq: Four times a day (QID) | ORAL | Status: DC
  Administered 2020-07-11 – 2020-07-13 (×8): 1 mg via ORAL
  Filled 2020-07-11 (×9): qty 1

## 2020-07-11 MED ORDER — GABAPENTIN 100 MG PO CAPS
100.0000 mg | ORAL_CAPSULE | Freq: Three times a day (TID) | ORAL | Status: DC
  Administered 2020-07-11 – 2020-07-15 (×12): 100 mg via ORAL
  Filled 2020-07-11 (×12): qty 1

## 2020-07-11 NOTE — Progress Notes (Signed)
HD#51 Subjective:   Patient resting in bed.  He states that he has been a little bit more sleepy today than normal.  Otherwise he still continues to have some lower back pain that is consistent with his pain that he has been having daily.  Denies any new pain or changes in sensation/weakness.  States that he is doing well and is anxious to get out of the hospital.  At home will speak with infectious disease but counseled him that they will likely want him to stay for the entire length of his antibiotic treatment.  States that he continues to have some paresthesias in his right lower extremity which is consistent for him in bilateral lower extremity swelling.  Objective:  Vital signs in last 24 hours: Vitals:   07/10/20 1937 07/11/20 0025 07/11/20 0033 07/11/20 0326  BP: 127/70 119/77  (!) 143/92  Pulse: 78 76  74  Resp: 18 14  18   Temp: 97.8 F (36.6 C) 98.4 F (36.9 C)  (!) 97.3 F (36.3 C)  TempSrc: Oral Oral  Oral  SpO2: 99%   98%  Weight:   81.7 kg   Height:       Supplemental O2: Room Air SpO2 98%   Physical Exam:   General: Patient appears chronically ill although is resting comfortably, in no acute distress. HENT: Poor dentition. Moist mucus membranes.  Respiratory: Lungs CTA bilaterally without wheezes, rales or rhonchi.  Cardiovascular: Rate is normal. There is a 3/6 harsh systolic murmur heard along the left lower sternal border. 3/6 mitral regurgitant murmur present. There is 2+ bilateral lower extremity pitting edema.  Musculoskeletal: There is significant, diffuse tenderness to palpation over the lower half of the back. Patient has an antalgic gait with decreased lordosis of the lumbar spine but otherwise walk well with therapy without troubles.  Skin: There is mild peeling over the bilateral shins although no erythema. Surgical lesion continues to heal well. Psychiatric: Pleasant and cooperative.   Filed Weights   07/09/20 0615 07/10/20 0206 07/11/20 0033   Weight: 81.7 kg 83.4 kg 81.7 kg     Intake/Output Summary (Last 24 hours) at 07/11/2020 0557 Last data filed at 07/11/2020 0327 Gross per 24 hour  Intake 1090 ml  Output 1875 ml  Net -785 ml   Net IO Since Admission: -70,822.07 mL [07/11/20 0557]  No results for input(s): GLUCAP in the last 72 hours.   Pertinent Labs: CBC Latest Ref Rng & Units 07/09/2020 07/06/2020 07/02/2020  WBC 4.0 - 10.5 K/uL 6.3 5.8 6.8  Hemoglobin 13.0 - 17.0 g/dL 8.0(L) 7.6(L) 8.0(L)  Hematocrit 39.0 - 52.0 % 25.0(L) 23.8(L) 24.8(L)  Platelets 150 - 400 K/uL 187 181 235    CMP Latest Ref Rng & Units 07/11/2020 07/10/2020 07/09/2020  Glucose 70 - 99 mg/dL 88 81 83  BUN 6 - 20 mg/dL 53(H) 51(H) 53(H)  Creatinine 0.61 - 1.24 mg/dL 2.56(H) 2.42(H) 2.70(H)  Sodium 135 - 145 mmol/L 139 140 140  Potassium 3.5 - 5.1 mmol/L 4.6 4.5 4.3  Chloride 98 - 111 mmol/L 109 107 106  CO2 22 - 32 mmol/L 25 24 23   Calcium 8.9 - 10.3 mg/dL 8.3(L) 8.5(L) 8.4(L)  Total Protein 6.5 - 8.1 g/dL - - -  Total Bilirubin 0.3 - 1.2 mg/dL - - -  Alkaline Phos 38 - 126 U/L - - -  AST 15 - 41 U/L - - -  ALT 0 - 44 U/L - - -    Imaging: VAS  [place] ABI WITH/WO TBI  Right: Resting right ankle-brachial index indicates noncompressible right lower extremity arteries. Left: Resting left ankle-brachial index is within normal range. No evidence of significant left lower extremity arterial disease.      Assessment/Plan:   Principal Problem:   Endocarditis Active Problems:   Urinary retention   AKI (acute kidney injury) ([place])   Hyperkalemia   Microcytic anemia   MRSA infection   Epidural abscess   Opioid use disorder   Hydronephrosis   Malnutrition of moderate degree   Antibiotic long-term use   Chronic viral hepatitis B without delta agent and without coma (HCC)   Fever   Anemia of chronic disease   Patient Summary: Steven Cortez is a 45 y.o. male with hx of IVDU, HTN and untreated hep C who presented for an evaluation of  tricuspid valve endocarditis and MRSA bacteremia. Hospital course complicated by acute renal failure requiring HD and large epidural abscess s/p laminectomy x2 (1/29 and 2/11). Now on long course IV abx treatment.   MRSA TV Endocarditis w/ Bacteremia Complicated by Septic Emboli and Epidural Abscess s/p I&D w/ laminectomy x 2 1 cm Soft tissue abscess, at surgical incision site of lower back.  Patient continues to have low back pain thought to be secondary to a small fluid collection at incision site of his lower back does not appear to be impinging on any neurologic structures.  Patient was started on gabapentin 300 mg 3 times daily yesterday for radicular pain.  He has developed some increased sleepiness since yesterday.  Therefore we will decrease his dose today.  He continues to have back pain.  I counseled him on using a heating pad to improve his symptoms.  He is tolerating his IV antibiotics well and continues to express interest in being discharged from the hospital in the near future.   -Continue IV Daptomycin (end date - 08/07/2020) -ESR/CRP/CK weekly -Continue current pain management including Tylenol 1 g every 8 hours, Dilaudid p.o. 2 mg every 8 hours, Dilaudid p.o. 1 mg every 4 hours as needed, Voltaren gel 4 times daily, gabapentin 100 mg 3 times daily. -We will continue IV Dilaudid as needed prior to PT/OT -Continue Robaxin 750 mg 4 times daily -PT/OT daily  Acute Kidney Injury on CKD Nephrotic Range Proteinuria, Worsening   Hematuria He continues to have good urine output daily with slow improvement of his renal function.  Creatinine today is 2.56 which is consistent with his creatinine yesterday.  We will continue to monitor renal function, urine output, and electrolytes daily.  -Strict ins and outs -Continue to monitor kidney function, electrolytes, and urine output daily -Avoid nephrotoxic medications when possible -Continue TED hose throughout the day  Ventricular  Bigeminy Patient's bigeminy is intermittent stable without symptoms. -We will continue Coreg 25 mg twice daily -Continue to monitor electrolytes daily. Mag > 2, K+ > 4 -Will reach out to cardiology if patient becomes symptomatic   History of IVDU -Continue to wean opioid use -Consider switching Suboxone prior to discharge.  Hypertension  Remains hypertensive we will continue to monitor closely.   -Continue antihypertensive regimen including Coreg, amlodipine, hydralazine   Diet: 2g Na without Fluid Restriction  IVF: PO intake VTE: Lovenox Code: Full PT/OT: Home w/ HH PT   Anticipated discharge to home with home health.  Lawerance Cruel, D.O.  Internal Medicine Resident, PGY-2 [facility]  Pager: [phone] 2:11 PM, 07/11/2020    Please contact the on call pager after 5  pm and on weekends at [phone].

## 2020-07-11 NOTE — Plan of Care (Signed)
  Problem: Clinical Measurements: Goal: Respiratory complications will improve Outcome: Progressing   Problem: Coping: Goal: Level of anxiety will decrease Outcome: Progressing   Problem: Pain Managment: Goal: General experience of comfort will improve Outcome: Progressing   

## 2020-07-12 LAB — RENAL FUNCTION PANEL
Albumin: 2.7 g/dL — ABNORMAL LOW (ref 3.5–5.0)
Anion gap: 8 (ref 5–15)
BUN: 48 mg/dL — ABNORMAL HIGH (ref 6–20)
CO2: 23 mmol/L (ref 22–32)
Calcium: 8.5 mg/dL — ABNORMAL LOW (ref 8.9–10.3)
Chloride: 106 mmol/L (ref 98–111)
Creatinine, Ser: 2.33 mg/dL — ABNORMAL HIGH (ref 0.61–1.24)
GFR, Estimated: 34 mL/min — ABNORMAL LOW (ref >60)
Glucose, Bld: 76 mg/dL (ref 70–99)
Phosphorus: 4.9 mg/dL — ABNORMAL HIGH (ref 2.5–4.6)
Potassium: 4.5 mmol/L (ref 3.5–5.1)
Sodium: 137 mmol/L (ref 135–145)

## 2020-07-12 LAB — MAGNESIUM: Magnesium: 2.4 mg/dL (ref 1.7–2.4)

## 2020-07-12 NOTE — Progress Notes (Signed)
HD#52 SUBJECTIVE:  Overnight Events: none  Patient sitting at his chair.  States is doing well.  He appears more awake today than yesterday.  He has been walking frequently and believes he is making good progress.  I spoke with him at length regarding his opioid use.  He states that he has been trying to make plans to avoid his past patterns of use once he is discharged.  He is making plans to stay with his brother and is considering starting Suboxone therapy prior to leaving.  Otherwise he denies any acute complaints.  OBJECTIVE:  Vital Signs: Vitals:   07/12/20 0321 07/12/20 0420 07/12/20 0421 07/12/20 0738  BP:  (!) 142/97 (!) 142/97   Pulse:  81 80   Resp:   18   Temp:   98.1 F (36.7 C) 97.6 F (36.4 C)  TempSrc:   Oral Oral  SpO2:  99% 97%   Weight: 83.4 kg     Height:       Supplemental O2: Nasal Cannula SpO2: 97 % O2 Flow Rate (L/min): 2 L/min FiO2 (%): 28 %  Filed Weights   07/10/20 0206 07/11/20 0033 07/12/20 0321  Weight: 83.4 kg 81.7 kg 83.4 kg     Intake/Output Summary (Last 24 hours) at 07/12/2020 0920 Last data filed at 07/12/2020 0811 Gross per 24 hour  Intake 1794.97 ml  Output 3155 ml  Net -1360.03 ml   Net IO Since Admission: -73,757.1 mL [07/12/20 0920]  Physical Exam: Physical Exam Constitutional:      Appearance: Normal appearance.  HENT:     Head: Normocephalic and atraumatic.  Eyes:     Extraocular Movements: Extraocular movements intact.  Cardiovascular:     Rate and Rhythm: Normal rate.     Pulses: Normal pulses.  Pulmonary:     Effort: Pulmonary effort is normal.     Breath sounds: Normal breath sounds.  Abdominal:     General: Bowel sounds are normal.     Palpations: Abdomen is soft.     Tenderness: There is no abdominal tenderness.  Musculoskeletal:        General: Normal range of motion.     Cervical back: Normal range of motion.     Right lower leg: No edema.     Left lower leg: No edema.  Skin:    General: Skin is  warm and dry.  Neurological:     Mental Status: He is alert and oriented to person, place, and time. Mental status is at baseline.  Psychiatric:        Mood and Affect: Mood normal.     Patient Lines/Drains/Airways Status    Active Line/Drains/Airways    Name Placement date Placement time Site Days   PICC Double Lumen 14/23/95 PICC Right Basilic 39 cm 0 cm 32/02/33  1304  - 40   Incision (Closed) 05/23/20 Back 05/23/20  1002  - 50   Incision (Closed) 06/05/20 Back 06/05/20  1546  - 37   Incision (Closed) 06/21/20 Perineum 06/21/20  1128  - 21          No results for input(s): GLUCAP in the last 72 hours.   Pertinent Labs: CBC Latest Ref Rng & Units 07/09/2020 07/06/2020 07/02/2020  WBC 4.0 - 10.5 K/uL 6.3 5.8 6.8  Hemoglobin 13.0 - 17.0 g/dL 8.0(L) 7.6(L) 8.0(L)  Hematocrit 39.0 - 52.0 % 25.0(L) 23.8(L) 24.8(L)  Platelets 150 - 400 K/uL 187 181 235    CMP Latest Ref Rng &  Units 07/11/2020 07/10/2020 07/09/2020  Glucose 70 - 99 mg/dL 88 81 83  BUN 6 - 20 mg/dL 53(H) 51(H) 53(H)  Creatinine 0.61 - 1.24 mg/dL 2.56(H) 2.42(H) 2.70(H)  Sodium 135 - 145 mmol/L 139 140 140  Potassium 3.5 - 5.1 mmol/L 4.6 4.5 4.3  Chloride 98 - 111 mmol/L 109 107 106  CO2 22 - 32 mmol/L 25 24 23   Calcium 8.9 - 10.3 mg/dL 8.3(L) 8.5(L) 8.4(L)  Total Protein 6.5 - 8.1 g/dL - - -  Total Bilirubin 0.3 - 1.2 mg/dL - - -  Alkaline Phos 38 - 126 U/L - - -  AST 15 - 41 U/L - - -  ALT 0 - 44 U/L - - -    Imaging: No results found.  ASSESSMENT/PLAN:   Principal Problem:   Endocarditis Active Problems:   Urinary retention   AKI (acute kidney injury) ([place])   Hyperkalemia   Microcytic anemia   MRSA infection   Epidural abscess   Opioid use disorder   Hydronephrosis   Malnutrition of moderate degree   Antibiotic long-term use   Chronic viral hepatitis B without delta agent and without coma (HCC)   Fever   Anemia of chronic disease   Patient Summary: an evaluation of tricuspid valve  endocarditis and MRSA bacteremia. Hospital course complicated by acute renal failure requiring HD and large epidural abscess s/p laminectomy x2 (1/29 and 2/11). Now on long course IV abx treatment.   MRSA TV Endocarditis w/ Bacteremia Complicated by Septic Emboli and Epidural Abscess s/p I&D w/ laminectomy x 2 1 cm Soft tissue abscess, at surgical incision site of lower back.  Patient doing well today tolerating PT well and antibiotic therapy. -Continue IV daptomycin (end date-08/21/2020) -ESR/CRP/CK weekly per ID -Continue current pain management -Continue Robaxin-750 milligrams 4 times daily -PT OT daily   Acute Kidney Injury on CKD Nephrotic Range Proteinuria, Worsening   Hematuria Continue have good urine output.  Kidney function improving slowly. -Strict ins and outs and monitor urine function daily -Avoid nephrotoxic medications -Continue TED hose daily.   Ventricular Bigeminy Stable -Continue Coreg 25 mg twice daily -Continue to monitor electrolytes.  Goal Mg > 2, K+ > 4 -Will reach out to cardiology if patient becomes symptomatic   History of IVDU -Continue to wean opioid use -Consider switching Suboxone prior to discharge.  Hypertension  Remains hypertensive we will continue to monitor closely.   -Continue antihypertensive regimen including Coreg, amlodipine, hydralazine   Best Practice: Diet: Cardiac diet IVF: PO intake VTE: enoxaparin  Code: Full PT/OT: Pending DISPO: pending   Anticipated discharge completion of antibiotics.  Lawerance Cruel, D.O.  Internal Medicine Resident, PGY-2 [facility]  Pager: [phone] 9:20 AM, 07/12/2020   Please contact the on call pager after 5 pm and on weekends at [phone].

## 2020-07-13 DIAGNOSIS — F1190 Opioid use, unspecified, uncomplicated: Secondary | ICD-10-CM

## 2020-07-13 LAB — CBC
HCT: 27.5 % — ABNORMAL LOW (ref 39.0–52.0)
Hemoglobin: 8.6 g/dL — ABNORMAL LOW (ref 13.0–17.0)
MCH: 28.3 pg (ref 26.0–34.0)
MCHC: 31.3 g/dL (ref 30.0–36.0)
MCV: 90.5 fL (ref 80.0–100.0)
Platelets: 215 10*3/uL (ref 150–400)
RBC: 3.04 MIL/uL — ABNORMAL LOW (ref 4.22–5.81)
RDW: 18 % — ABNORMAL HIGH (ref 11.5–15.5)
WBC: 8.2 10*3/uL (ref 4.0–10.5)
nRBC: 0 % (ref 0.0–0.2)

## 2020-07-13 LAB — CK: Total CK: 58 U/L (ref 49–397)

## 2020-07-13 MED ORDER — BUPRENORPHINE HCL-NALOXONE HCL 2-0.5 MG SL SUBL
1.0000 | SUBLINGUAL_TABLET | Freq: Every day | SUBLINGUAL | Status: AC
  Administered 2020-07-13: 1 via SUBLINGUAL
  Filled 2020-07-13: qty 1

## 2020-07-13 MED ORDER — HYDROMORPHONE HCL 2 MG PO TABS: 1.0000 mg | ORAL_TABLET | Freq: Three times a day (TID) | ORAL | Status: DC

## 2020-07-13 NOTE — Progress Notes (Signed)
Physical Therapy Treatment Patient Details Name: Steven Cortez MRN: [id number] DOB: 05/07/1975 Today's Date: 07/13/2020    History of Present Illness Pt is a 45 y.o. 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 S/p laminectomy L4-S1 and evacuation of abscess on 1/29. S/p TEE 2/3. S/p repeat evacuation/extension of decompression on 2/11. Lumbar MRI 3/17 showed small fluid collection over L4-5 incision, but improved phlegmon between L4-S1 epidural space. PMH includes Hep C, IVDU, tobacco use.   PT Comments    Pt progressing well with mobility; mod indep ambulating with RW. Session focused on continued stair training and ambulation without UE support. Pt demonstrates improving pain and stability; hopeful for d/c home soon. Will continue to follow acutely.   Follow Up Recommendations  Outpatient PT;Supervision - Intermittent     Equipment Recommendations  Rolling walker with 5" wheels    Recommendations for Other Services       Precautions / Restrictions Precautions Precautions: Fall;Back Restrictions Weight Bearing Restrictions: No    Mobility  Bed Mobility               General bed mobility comments: Received sitting in recliner    Transfers Overall transfer level: Modified independent Equipment used: Rolling walker (2 wheeled);None                Ambulation/Gait Ambulation/Gait assistance: Modified independent (Device/Increase time);Supervision Gait Distance (Feet): 300 Feet Assistive device: Rolling walker (2 wheeled);None Gait Pattern/deviations: Step-through pattern;Decreased stride length     General Gait Details: Pt mod indep ambulating with RW; supervision for safety ambulating without DME, improving stability without UE support  compared to prior sessions   Stairs   Stairs assistance: Supervision Stair Management: One rail Right;Step to pattern;Alternating pattern;Forwards Number of Stairs: 10 General stair comments: Ascend/descend multiple steps alternating between BLEs as 'leading' foot, use of rail for balance; improved stability without assist   Wheelchair Mobility    Modified Rankin (Stroke Patients Only)       Balance Overall balance assessment: Needs assistance Sitting-balance support: No upper extremity supported;Feet supported Sitting balance-Leahy Scale: Good Sitting balance - Comments: Able to don shorts sitting in recliner   Standing balance support: During functional activity;Single extremity supported Standing balance-Leahy Scale: Fair Standing balance comment: Performing ADL tasks without UE support                            Cognition Arousal/Alertness: Awake/alert Behavior During Therapy: WFL for tasks assessed/performed Overall Cognitive Status: Within Functional Limits for tasks assessed                                        Exercises      General Comments        Pertinent Vitals/Pain Pain Assessment: Faces Faces Pain Scale: Hurts a little bit Pain Location: back Pain Descriptors / Indicators: Discomfort Pain Intervention(s): Monitored during session    Home Living                      Prior Function            PT Goals (current goals can now be found in the care plan section) Progress towards PT  goals: Progressing toward goals    Frequency    Min 3X/week      PT Plan Current plan remains appropriate    Co-evaluation              AM-PAC PT "6 Clicks" Mobility   Outcome Measure  Help needed turning from your back to your side while in a flat bed without using bedrails?: None Help needed moving from lying on your back to sitting on the side of a flat bed without using bedrails?: None Help needed moving to  and from a bed to a chair (including a wheelchair)?: None Help needed standing up from a chair using your arms (e.g., wheelchair or bedside chair)?: None Help needed to walk in hospital room?: A Little Help needed climbing 3-5 steps with a railing? : A Little 6 Click Score: 22    End of Session   Activity Tolerance: Patient tolerated treatment well Patient left: in chair;with call bell/phone within reach Nurse Communication: Mobility status PT Visit Diagnosis: Unsteadiness on feet (R26.81);Pain Pain - Right/Left: Right Pain - part of body: Leg     Time: 0850-0905 PT Time Calculation (min) (ACUTE ONLY): 15 min  Charges:  $Gait Training: 8-22 mins                     Ina Homes, PT, DPT Acute Rehabilitation Services  Pager [phone] Office [phone]  Malachy Chamber 07/13/2020, 10:12 AM

## 2020-07-13 NOTE — Progress Notes (Signed)
[facility] for Infectious Disease  Date of Admission:  05/21/2020      Total days of antibiotics 53   Daptomycin    ASSESSMENT: Steven Cortez is a 45 y.o. 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   His back looks great - incision is well healed over and minimal pain. No erythema or swelling. Walking around pretty well without support aid. No radicular pain or neurologic compromise. CRP trending down with ESR staying ~90s. MRI repeated reveals no re-accumulation of epidural abscess. Osteomyelitis/discitis changes would be expected this soon from surgery/treatment and have not progressed.   He had a pretty large tricuspid valve vegetation measuring 2cm x 1cm that was not amenable to angiovac debulking. Will discuss with him about arranging outpatient echo after treatment to establish new baseline. No need to do this now as I am certain the vegetation is not completely gone at this point.   He has completed 7 weeks of IV therapy for his severe infection - He is going to discuss ability to arrange weekly dalbavancin infusions x 2-3 more doses to continue with treatment vs prolonged course of oral doxycycline to continue treatment. FU in ID clinic to be arranged based on what he is able to do - he will d/w his family and let us know in AM what he is able to do.   History of hepatitis B - cleared infection with (+) total core and surface antibody and negative surface antigen.    PLAN: 1. Continue Dapto IV for now 2. FU with patient tomorrow 3/22 about what he is able to do.      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 acetaminophen  1,000 mg Oral Q8H  . amLODipine  10 mg Oral Daily  . calcium carbonate  1 tablet Oral TID WC  . carvedilol  25 mg Oral BID WC  . Chlorhexidine Gluconate Cloth  6 each Topical Daily  . diclofenac Sodium  4 g Topical QID  . enoxaparin (LOVENOX) injection  40 mg Subcutaneous Daily  . feeding supplement  237 mL Oral BID BM  . ferrous sulfate  325 mg Oral Q breakfast  . gabapentin  100 mg Oral TID  . hydrALAZINE  50 mg Oral Q8H  . methocarbamol  750 mg Oral QID  . multivitamin with minerals  1 tablet Oral Daily  . polyethylene glycol  17 g Oral BID  . senna-docusate  2 tablet Oral BID  . sodium chloride flush  10-40 mL Intracatheter Q12H    SUBJECTIVE: States he feels much better. Would like to try to leave by Wednesday to arrange to be home for his elderly mother's birthday on 3/27. He is being transitioned to suboxone PO for opioid disorder management and first doses given today.  Has never been on this before. Planning to follow up in IM clinic for maintenance therapy weekly. Would like to discuss an option for ongoing treatment outpatient for his infection.  Denies fevers/chills. No new acute problems. Back pain "still sore, but way better than when he was admitted before surgery." No cough, chest pain.    Review of Systems: Review of Systems  Constitutional: Negative for chills and fever.  HENT: Negative for tinnitus.   Eyes: Negative for blurred vision and photophobia.  Respiratory: Negative for cough and sputum production.   Cardiovascular: Negative for chest pain.  Gastrointestinal: Negative for diarrhea, nausea and vomiting.  Genitourinary: Negative for dysuria.  Musculoskeletal: Positive for back pain. Negative for joint pain.  Skin: Negative for rash.  Neurological: Negative for weakness and headaches.    No Known  Allergies  OBJECTIVE: Vitals:   07/12/20 2347 07/13/20 0508 07/13/20 0724 07/13/20 1109  BP: 134/84 (!) 164/98 (!) 153/100 138/85  Pulse: 88 80 79 80  Resp: _0 Temp: 98.4 F (36.9 C) 97.7 F (36.5 C) 97.7 F (36.5 C) 97.7 F (36.5 C)  TempSrc: Oral Oral Oral Oral  SpO2: 99% 99% 99% 99%  Weight:      Height:       Body mass index is 26.38 kg/m.  Physical Exam Vitals reviewed.  Constitutional:      Appearance: He is well-developed.     Comments: Standing walking around in room. Appears only mildly uncomfortable.   HENT:     Mouth/Throat:     Dentition: Normal dentition. No dental abscesses.  Cardiovascular:     Rate and Rhythm: Normal rate and regular rhythm.     Heart sounds: Normal heart sounds. No murmur heard.   Pulmonary:     Effort: Pulmonary effort is normal.     Breath sounds: Normal breath sounds.  Abdominal:     General: There is no distension.     Palpations: Abdomen is soft.     Tenderness: There is no abdominal tenderness.  Lymphadenopathy:     Cervical: No cervical adenopathy.  Skin:    General: Skin is warm and dry.     Findings: No rash.  Neurological:     Mental Status: He is alert and oriented to person, place, and time.  Psychiatric:        Judgment: Judgment normal.     Comments: In good spirits today and engaged in care discussion.      Lab Results Lab Results  Component Value Date   WBC 8.2 07/13/2020   HGB 8.6 (L) 07/13/2020   HCT 27.5 (L) 07/13/2020   MCV 90.5 07/13/2020   PLT 215 07/13/2020    Lab Results  Component Value Date   CREATININE 2.33 (H) 07/12/2020   BUN 48 (H) 07/12/2020   NA 137 07/12/2020   K 4.5 07/12/2020   CL 106 07/12/2020   CO2 23 07/12/2020    Lab Results  Component Value Date   ALT 21 06/29/2020   AST 15 06/29/2020   ALKPHOS 141 (H) 06/29/2020   BILITOT 0.6 06/29/2020     Microbiology: No results found for this or any previous visit (from the past 240 hour(s)).   Janene Madeira,  MSN, NP-C [facility] for Infectious Disease [email or web address] Pager: [phone] Office: [phone] [place]: [phone]

## 2020-07-13 NOTE — Progress Notes (Signed)
HD#53 Subjective:   [name] states he is doing well. He would like to be discharged this week. He continues to experience some back and right leg discomfort, although he is excited he is able to walk up stair with PT. Says his legs are more swollen although he works to elevate them. Continues to deny CP, palpitations, troubles with his breathing, or any other symptoms.   Discussed with him about suboxone use. He states he talked with his brother who will be able to give him transportation to and from appointments.   Objective:  Vital signs in last 24 hours: Vitals:   07/12/20 2034 07/12/20 2347 07/13/20 0508 07/13/20 0724  BP: (!) 141/86 134/84 (!) 164/98 (!) 153/100  Pulse: 82 88 80 79  Resp: _0 Temp: 97.6 F (36.4 C) 98.4 F (36.9 C) 97.7 F (36.5 C) 97.7 F (36.5 C)  TempSrc: Oral Oral Oral Oral  SpO2: 100% 99% 99% 99%  Weight:      Height:       Supplemental O2: Room Air SpO2 98%   Physical Exam:   General: Patient appears chronically ill although is sitting comfortably, in no acute distress. HENT: Poor dentition. Moist mucus membranes.  Respiratory: Lungs CTA bilaterally without wheezes, rales or rhonchi.  Cardiovascular: Rate is normal. There is a 2/6 systolic murmur heard along the left lower sternal border. 1/6 mitral regurgitant murmur present. There is 2-3+ bilateral lower extremity pitting edema to just below the knees.  Musculoskeletal: Patient is able to sit up much easier today. Psychiatric: Pleasant and cooperative.   Filed Weights   07/10/20 0206 07/11/20 0033 07/12/20 0321  Weight: 83.4 kg 81.7 kg 83.4 kg     Intake/Output Summary (Last 24 hours) at 07/13/2020 0725 Last data filed at 07/13/2020 0615 Gross per 24 hour  Intake 2462 ml  Output 4325 ml  Net -1863 ml   Net IO Since Admission: -75,200.1 mL [07/13/20 0725]  No results for input(s): GLUCAP in the last 72 hours.   Pertinent Labs: CBC Latest Ref Rng & Units 07/13/2020  07/09/2020 07/06/2020  WBC 4.0 - 10.5 K/uL 8.2 6.3 5.8  Hemoglobin 13.0 - 17.0 g/dL 8.6(L) 8.0(L) 7.6(L)  Hematocrit 39.0 - 52.0 % 27.5(L) 25.0(L) 23.8(L)  Platelets 150 - 400 K/uL 215 187 181    CMP Latest Ref Rng & Units 07/12/2020 07/11/2020 07/10/2020  Glucose 70 - 99 mg/dL 76 88 81  BUN 6 - 20 mg/dL 48(H) 53(H) 51(H)  Creatinine 0.61 - 1.24 mg/dL 2.33(H) 2.56(H) 2.42(H)  Sodium 135 - 145 mmol/L 137 139 140  Potassium 3.5 - 5.1 mmol/L 4.5 4.6 4.5  Chloride 98 - 111 mmol/L 106 109 107  CO2 22 - 32 mmol/L _1 Calcium 8.9 - 10.3 mg/dL 8.5(L) 8.3(L) 8.5(L)  Total Protein 6.5 - 8.1 g/dL - - -  Total Bilirubin 0.3 - 1.2 mg/dL - - -  Alkaline Phos 38 - 126 U/L - - -  AST 15 - 41 U/L - - -  ALT 0 - 44 U/L - - -    Imaging: VAS [place] ABI WITH/WO TBI  Right: Resting right ankle-brachial index indicates noncompressible right lower extremity arteries. Left: Resting left ankle-brachial index is within normal range. No evidence of significant left lower extremity arterial disease.      Assessment/Plan:   Principal Problem:   Endocarditis Active Problems:   Urinary retention   AKI (acute kidney injury) ([place])   Hyperkalemia  Microcytic anemia   MRSA infection   Epidural abscess   Opioid use disorder   Hydronephrosis   Malnutrition of moderate degree   Antibiotic long-term use   Chronic viral hepatitis B without delta agent and without coma (HCC)   Fever   Anemia of chronic disease   Patient Summary: Steven Cortez is a 45 y.o. 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 Hospital course complicated by acute renal failure requiring HD and large epidural abscess s/p laminectomy x2 (1/29 and 2/11). Now on long course IV abx treatment.   MRSA TV Endocarditis w/ Bacteremia Complicated by Septic Emboli and Epidural Abscess s/p I&D w/ laminectomy x 2 Mr. Dowland' pain continues to be well controlled  on increasingly smaller doses of Dilaudid. Remains afebrile without leukocytosis on IV Daptomycin. Continues to do well with PT although is eager to go home within the next couple of days. -Continue IV Daptomycin (end date - 08/14/2020) -Consulted ID for possible initiation of PO antibiotics given strong desire to go home -Continue ESR/CRP and CK weekly  -Scheduled tylenol 1g q8 hours -Discontinued PO dilaudid  -Not requiring any PRN's -Will start Suboxone this afternoon; uptitrate this evening if needed -Continue Voltaren gel 4mg QID  -Continue gabapentin 100mg TID -Continue Robaxin 750mg four times daily -Continue PT/OT  Acute Kidney Injury on CKD Nephrotic Range Proteinuria, Worsening   Hematuria Creatinine and albumin have continued to improve, slowly. Likely there was a pre-renal component given lifting of fluid restriction has improved renal function. Denies any troubles urinating.  -Strict I&O's -Avoid nephrotoxic agents as able -Continue knee-high TED stockings as tolerated -Check renal function tomorrow morning  -Intermittently monitor proteinuria   Ventricular Bigeminy Intermittent, stable. -Continue to monitor / replace magnesium and K+intermittently with goal Mag > 2, K+ > 4 -Continue Coreg 25 mg twice daily -Will reach out to cardiology if patient becomes symptomatic   History of IVDU Mr. Schrieber is interested in starting suboxone prior to discharge with follow up outpatient in our OUD clinic. He does mention some transportation and financial concerns and is currently uninsured although has made arrangements with his brother who intends to help transport him to appointments. -Will start suboxone this afternoon  -Would benefit from counseling resources at discharge  Hypertension  Patient continues to have intermittent HTN despite antihypertensive regimen.  - Continue Coreg, amlodipine, and hydralazine  - Consider increasing hydralazine tomorrow if pressures remain  elevated - Plan to initiate losartan once renal function stabilizes given significant proteinuria   Diet: 2g Na without Fluid Restriction  IVF: PO intake VTE: Lovenox Code: Full PT/OT: Home w/ HH PT   Anticipated discharge to home with home health.  Rachel Speakman, MD Internal Medicine Resident, PGY-1 [place] [facility]  Pager: #[phone] 7:25 AM, 07/13/2020   Please contact the on call pager after 5 pm and on weekends at [phone]. 

## 2020-07-14 ENCOUNTER — Other Ambulatory Visit: Payer: Self-pay | Admitting: Student

## 2020-07-14 LAB — RENAL FUNCTION PANEL
Albumin: 2.8 g/dL — ABNORMAL LOW (ref 3.5–5.0)
Anion gap: 9 (ref 5–15)
BUN: 55 mg/dL — ABNORMAL HIGH (ref 6–20)
CO2: 23 mmol/L (ref 22–32)
Calcium: 9.1 mg/dL (ref 8.9–10.3)
Chloride: 105 mmol/L (ref 98–111)
Creatinine, Ser: 2.42 mg/dL — ABNORMAL HIGH (ref 0.61–1.24)
GFR, Estimated: 33 mL/min — ABNORMAL LOW (ref >60)
Glucose, Bld: 92 mg/dL (ref 70–99)
Phosphorus: 5.6 mg/dL — ABNORMAL HIGH (ref 2.5–4.6)
Potassium: 4.9 mmol/L (ref 3.5–5.1)
Sodium: 137 mmol/L (ref 135–145)

## 2020-07-14 MED ORDER — LOSARTAN POTASSIUM 25 MG PO TABS
25.0000 mg | ORAL_TABLET | Freq: Every day | ORAL | Status: DC
  Administered 2020-07-14 – 2020-07-15 (×2): 25 mg via ORAL
  Filled 2020-07-14 (×2): qty 1

## 2020-07-14 MED ORDER — CALCIUM CARBONATE ANTACID 500 MG PO CHEW: 1.0000 | CHEWABLE_TABLET | Freq: Three times a day (TID) | ORAL | 0 refills | Status: AC

## 2020-07-14 MED ORDER — LOSARTAN POTASSIUM 25 MG PO TABS
25.0000 mg | ORAL_TABLET | Freq: Every day | ORAL | 0 refills | Status: AC
Start: 2020-07-14 — End: ?

## 2020-07-14 MED ORDER — DOXYCYCLINE MONOHYDRATE 100 MG PO TABS: 100.0000 mg | ORAL_TABLET | Freq: Two times a day (BID) | ORAL | 0 refills | Status: AC

## 2020-07-14 MED ORDER — METHOCARBAMOL 750 MG PO TABS: 750.0000 mg | ORAL_TABLET | Freq: Four times a day (QID) | ORAL | 0 refills | Status: AC

## 2020-07-14 MED ORDER — ORITAVANCIN DIPHOSPHATE 400 MG IV SOLR
1200.0000 mg | Freq: Once | INTRAVENOUS | Status: AC
  Administered 2020-07-14: 1200 mg via INTRAVENOUS
  Filled 2020-07-14: qty 120

## 2020-07-14 MED ORDER — GABAPENTIN 100 MG PO CAPS: 100.0000 mg | ORAL_CAPSULE | Freq: Three times a day (TID) | ORAL | 0 refills | Status: AC

## 2020-07-14 MED ORDER — HYDRALAZINE HCL 50 MG PO TABS: 50.0000 mg | ORAL_TABLET | Freq: Three times a day (TID) | ORAL | 0 refills | Status: AC

## 2020-07-14 MED ORDER — DICLOFENAC SODIUM 1 % EX GEL: 4.0000 g | Freq: Four times a day (QID) | CUTANEOUS | 0 refills | Status: AC

## 2020-07-14 MED ORDER — CARVEDILOL 25 MG PO TABS: 25.0000 mg | ORAL_TABLET | Freq: Two times a day (BID) | ORAL | 0 refills | Status: AC

## 2020-07-14 MED ORDER — BUPRENORPHINE HCL-NALOXONE HCL 2-0.5 MG SL SUBL
1.0000 | SUBLINGUAL_TABLET | Freq: Every day | SUBLINGUAL | Status: DC
Start: 2020-07-14 — End: 2020-07-15
  Administered 2020-07-14 – 2020-07-15 (×2): 1 via SUBLINGUAL
  Filled 2020-07-14 (×2): qty 1

## 2020-07-14 MED ORDER — AMLODIPINE BESYLATE 10 MG PO TABS
10.0000 mg | ORAL_TABLET | Freq: Every day | ORAL | 0 refills | Status: AC
Start: 2020-07-15 — End: ?

## 2020-07-14 MED FILL — DICLOFENAC SODIUM 1 % GEL: 1 | 24 days supply | Qty: 300 | Fill #0

## 2020-07-14 MED FILL — AMLODIPINE BESYLATE 10 MG T: 10 | 30 days supply | Qty: 30 | Fill #0

## 2020-07-14 MED FILL — LOSARTAN POTASSIUM 25 MG TA: 25 | 30 days supply | Qty: 30 | Fill #0

## 2020-07-14 MED FILL — DOXYCYCLINE HYCLATE 100 MG: 100 | 24 days supply | Qty: 48 | Fill #0

## 2020-07-14 MED FILL — CALCIUM ANTACID 500 MG CHW: 500 | 30 days supply | Qty: 90 | Fill #0

## 2020-07-14 MED FILL — METHOCARBAMOL 750 MG TABS: 750 | 30 days supply | Qty: 120 | Fill #0

## 2020-07-14 MED FILL — CARVEDILOL 25 MG TABLET: 25 | 30 days supply | Qty: 60 | Fill #0

## 2020-07-14 MED FILL — hydrALAZINE HCL 50 MG TABS: 50 | 30 days supply | Qty: 90 | Fill #0

## 2020-07-14 MED FILL — GABAPENTIN 100 MG CAPSULE: 100 | 30 days supply | Qty: 90 | Fill #0

## 2020-07-14 NOTE — Progress Notes (Signed)
Physical Therapy Treatment Patient Details Name: Steven Cortez MRN: [id number] DOB: 06-25-1975 Today's Date: 07/14/2020    History of Present Illness Pt is a 45 y.o. male recently admitted to [facility] and found to have tricuspid endocarditis, MRSA bacteremia, septic PE, but left AMA; now admitted 05/21/20 with weakness, SOB, chest pain, edema. 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 S/p laminectomy L4-S1 and evacuation of abscess on 1/29. S/p TEE 2/3. S/p repeat evacuation/extension of decompression on 2/11. Lumbar MRI 3/17 showed small fluid collection over L4-5 incision, but improved phlegmon between L4-S1 epidural space. PMH includes Hep C, IVDU, tobacco use.   PT Comments    Pt progressing well with mobility. Able to transfer, ambulate, ascend/descend stairs and demonstrate good balance without DME, no overt instability or LOB noted; also demonstrates good ability to maintain back precautions with various tasks. Dynamic Gait Index score for 20/24 does not indicate increased risk for falls with higher level balance tasks. Pt in good spirits, hopeful for d/c home tomorrow. Reviewed educ re: precautions, positioning, activity recommendations. Pt has met short-term acute PT goals; reports no further questions or concerns. Will d/c acute PT.   Follow Up Recommendations  No PT follow up     Equipment Recommendations  Rolling walker with 5" wheels    Recommendations for Other Services       Precautions / Restrictions Precautions Precautions: Back Precaution Comments: Pt with good awareness of back precautions Restrictions Weight Bearing Restrictions: No    Mobility  Bed Mobility               General bed mobility comments: Received sitting in recliner    Transfers Overall transfer level: Independent Equipment used: None                Ambulation/Gait Ambulation/Gait assistance: Independent Gait Distance (Feet): 500  Feet Assistive device: None       General Gait Details: Steady gait, independent without DME; no overt instability or LOB with higher level balance tasks; DOE 2-3/4   Stairs Stairs: Yes Stairs assistance: Modified independent (Device/Increase time) Stair Management: One rail Right;Alternating pattern;Step to pattern;Forwards Number of Stairs: 16 General stair comments: Mod indep ascending/descending steps with rail support; able to perform step-to progressing to alternating pattern   Wheelchair Mobility    Modified Rankin (Stroke Patients Only)       Balance Overall balance assessment: Needs assistance Sitting-balance support: No upper extremity supported;Feet supported Sitting balance-Leahy Scale: Good       Standing balance-Leahy Scale: Good               High level balance activites: Side stepping;Braiding;Backward walking;Direction changes;Turns;Sudden stops;Head turns High Level Balance Comments: Able to reach down to pick object off floor - good awareness to squat down with UE support on thigh, as opposed to bending, in order to maintain back precautions (was not cued to do it this way) Standardized Balance Assessment Standardized Balance Assessment : Dynamic Gait Index   Dynamic Gait Index Level Surface: Normal Change in Gait Speed: Mild Impairment Gait with Horizontal Head Turns: Normal Gait with Vertical Head Turns: Normal Gait and Pivot Turn: Mild Impairment Step Over Obstacle: Mild Impairment Step Around Obstacles: Normal Steps: Mild Impairment Total Score: 20      Cognition Arousal/Alertness: Awake/alert Behavior During Therapy: WFL for tasks assessed/performed Overall Cognitive Status: Within Functional Limits for tasks assessed  Exercises      General Comments General comments (skin integrity, edema, etc.): HR 98, SpO2 95% on RA      Pertinent Vitals/Pain Pain Assessment:  Faces Faces Pain Scale: Hurts a little bit Pain Location: RLE Pain Descriptors / Indicators: Sore Pain Intervention(s): Monitored during session    Home Living                      Prior Function            PT Goals (current goals can now be found in the care plan section) Progress towards PT goals: Goals met/education completed, patient discharged from PT    Frequency    Min 3X/week      PT Plan Discharge plan needs to be updated    Co-evaluation              AM-PAC PT "6 Clicks" Mobility   Outcome Measure  Help needed turning from your back to your side while in a flat bed without using bedrails?: None Help needed moving from lying on your back to sitting on the side of a flat bed without using bedrails?: None Help needed moving to and from a bed to a chair (including a wheelchair)?: None Help needed standing up from a chair using your arms (e.g., wheelchair or bedside chair)?: None Help needed to walk in hospital room?: None Help needed climbing 3-5 steps with a railing? : None 6 Click Score: 24    End of Session   Activity Tolerance: Patient tolerated treatment well Patient left: in chair;with call bell/phone within reach Nurse Communication: Mobility status PT Visit Diagnosis: Unsteadiness on feet (R26.81);Pain Pain - Right/Left: Right Pain - part of body: Leg     Time: 1460-4799 PT Time Calculation (min) (ACUTE ONLY): 14 min  Charges:  $Gait Training: 8-22 mins                    Mabeline Caras, PT, DPT Acute Rehabilitation Services  Pager [phone] Office [place] 07/14/2020, 10:27 AM

## 2020-07-14 NOTE — Progress Notes (Signed)
[facility]  Date of Admission:  05/21/2020           Reason for visit: Follow up on epidural abscess  Current antibiotics: Daptomycin  ASSESSMENT:    1. MRSA bacteremia: With tricuspid valve endocarditis and lumbar spine epidural abscess/discitis/osteomyelitis status post laminectomy with return to the OR on 06/05/2020.  Has remained inpatient for the duration of his therapy which is set to conclude on 08/06/20 (8 weeks from last surgery).  He has been doing well and repeat MRI done 3/17 with expected continued radiographic persistence of osteomyelitis/discitis, but overall improved.  No residual frank epidural abscess seen.  He remains fixed in his desire to leave the hospital on Wednesday in order to be with his mom.  He will be unable to reliably come to the infusion clinic for weekly long-acting antibiotics. 2. History of injection drug use 3. History of prior hepatitis B: Surface antibody and core antibody reactive, surface antigen nonreactive  PLAN:    . Will give him a dose of oritavancin this evening prior to discharge tomorrow and transition to doxycycline 100 mg p.o. twice daily at discharge to continue therapy through Aug 06, 2020.  We had considered linezolid but deferred this due to interaction with his Suboxone . [facility] follow up scheduled 07/21/20 with Dr Renold Don.   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 acetaminophen  1,000 mg Oral Q8H  . amLODipine  10 mg Oral Daily  . buprenorphine-naloxone  1 tablet Sublingual Daily  . calcium carbonate  1 tablet Oral TID WC   . carvedilol  25 mg Oral BID WC  . Chlorhexidine Gluconate Cloth  6 each Topical Daily  . diclofenac Sodium  4 g Topical QID  . enoxaparin (LOVENOX) injection  40 mg Subcutaneous Daily  . feeding supplement  237 mL Oral BID BM  . ferrous sulfate  325 mg Oral Q breakfast  . gabapentin  100 mg Oral TID  . hydrALAZINE  50 mg Oral Q8H  . losartan  25 mg Oral Daily  . methocarbamol  750 mg Oral QID  . multivitamin with minerals  1 tablet Oral Daily  . polyethylene glycol  17 g Oral BID  . senna-docusate  2 tablet Oral BID  . sodium chloride flush  10-40 mL Intracatheter Q12H   Continuous Infusions: . sodium chloride 10 mL/hr at 07/09/20 2236  . oritavancin (ORBACTIV) IVPB     PRN Meds:.sodium chloride, albuterol, sodium chloride flush  SUBJECTIVE:   24 hour events:  No acute events noted  Patient reports that he still has a desire to leave tomorrow in order to be with his mom.  He states that he will not be able to reliably have a ride to outpatient infusion clinics once a week or long-acting injectable antibiotics  Review of Systems  Constitutional: Negative for chills and fever.  Respiratory: Negative.   Cardiovascular: Negative.   Musculoskeletal: Negative for back pain.  Skin: Negative.       OBJECTIVE:   Blood pressure (!) 141/91,  pulse 76, temperature (!) 97.5 F (36.4 C), temperature source Oral, resp. rate 16, height 5\' 10"  (1.778 m), weight 79.8 kg, SpO2 100 %. Body mass index is 25.24 kg/m.  Physical Exam Constitutional:      General: He is not in acute distress.    Appearance: He is well-developed.  HENT:     Head: Normocephalic and atraumatic.  Pulmonary:     Effort: Pulmonary effort is normal. No respiratory distress.  Musculoskeletal:     Cervical back: Normal range of motion and neck supple.  Neurological:     General: No focal deficit present.     Mental Status: He is alert and oriented to person, place, and time.  Psychiatric:        Mood and  Affect: Mood normal.        Behavior: Behavior normal.      Lab Results: Lab Results  Component Value Date   WBC 8.2 07/13/2020   HGB 8.6 (L) 07/13/2020   HCT 27.5 (L) 07/13/2020   MCV 90.5 07/13/2020   PLT 215 07/13/2020    Lab Results  Component Value Date   NA 137 07/14/2020   K 4.9 07/14/2020   CO2 23 07/14/2020   GLUCOSE 92 07/14/2020   BUN 55 (H) 07/14/2020   CREATININE 2.42 (H) 07/14/2020   CALCIUM 9.1 07/14/2020   GFRNONAA 33 (L) 07/14/2020    Lab Results  Component Value Date   ALT 21 06/29/2020   AST 15 06/29/2020   ALKPHOS 141 (H) 06/29/2020   BILITOT 0.6 06/29/2020       Component Value Date/Time   CRP 2.9 (H) 07/09/2020 0553       Component Value Date/Time   ESRSEDRATE 95 (H) 07/09/2020 0553     I have reviewed the micro and lab results in Epic.  Imaging: No results found.   Imaging independently reviewed in Epic.    07/11/2020 for Infectious Disease [facility] [phone] pager 07/14/2020, 11:53 AM   I spent greater than 35 minutes with the patient including greater than 50% of time in face to face counsel of the patient and in coordination of their care.

## 2020-07-14 NOTE — Progress Notes (Signed)
Nutrition Follow-up  DOCUMENTATION CODES:   Non-severe (moderate) malnutrition in context of chronic illness  INTERVENTION:   -Continue snacks BID -Continue Ensure Enlive po BID, each supplement provides 350 kcal and 20 grams of protein -Continue MVI with minerals daily -Continue double protein portions with meals  NUTRITION DIAGNOSIS:   Moderate Malnutrition related to chronic illness (IV drug abuse) as evidenced by energy intake < or equal to 75% for > or equal to 1 month,mild muscle depletion,mild fat depletion.  Ongoing  GOAL:   Patient will meet greater than or equal to 90% of their needs  Progressing   MONITOR:   PO intake,Supplement acceptance,Labs,Weight trends,Skin,I & O's  REASON FOR ASSESSMENT:   LOS    ASSESSMENT:   Steven Cortez is a 45 y.o. male with hx of IV drug use and untreated Hep C who was diagnosed with endocarditis and MRSA bacteremia at [facility], left AMA then presented to [facility] a few days later. Also found to have large epidural abscess, s/p laminectomy and evacuation by neurosurgery on 1/29. Symptoms and leukocytosis are improving with antibiotics.  Reviewed I/O's: - ml x 24 hours and -29.1 L since 06/30/20  UOP: 1.8 L x 24 hours  Pt not in room at time of visit.   Pt's appetite remains good. Noted meal completion 100%. Pt has been consuming Ensure Enlive supplements consistently, however, refused this morning's dose.   Plan for extended hospitalization due to need for completion of IV antibiotics.   Medications reviewed and include suboxone, calcium carbonate, lovenox, miralax, and senokot.   Labs reviewed.   Diet Order:   Diet Order            Diet 2 gram sodium Room service appropriate? Yes; Fluid consistency: Thin  Diet effective now                 EDUCATION NEEDS:   Education needs have been addressed  Skin:  Skin Assessment: Skin Integrity Issues: Skin Integrity Issues:: Incisions Incisions: closed back x  2, closed perineum  Last BM:  07/13/20  Height:   Ht Readings from Last 1 Encounters:  06/05/20 5\' 10"  (1.778 m)    Weight:   Wt Readings from Last 1 Encounters:  07/14/20 79.8 kg    Ideal Body Weight:  75.5 kg  BMI:  Body mass index is 25.24 kg/m.  Estimated Nutritional Needs:   Kcal:  2300-2500  Protein:  135-155 grams  Fluid:  > 2 L    07/16/20, RD, LDN, CDCES Registered Dietitian II Certified Diabetes Care and Education Specialist Please refer to [facility] for RD and/or RD on-call/weekend/after hours pager

## 2020-07-14 NOTE — Progress Notes (Signed)
Pt ambulated the full length of the hallway at the start of this shift and at 04:30am.

## 2020-07-14 NOTE — TOC Initial Note (Addendum)
Transition of Care [facility] - Initial/Assessment Note    Patient Details  Name: Steven Cortez MRN: [id number] Date of Birth: Dec 05, 1975  [facility] of [facility]) CM/SW Contact:    Leone Haven, RN Phone Number: 07/14/2020, 2:47 PM  Clinical Narrative:                 Patient states he will have transportation home with his brother tomorrow.  NCM will asti with medications thru [facility].  [facility] was put in today. TOC to fill meds.  The rolling walker for charity is already in the room with patient from [employer].  Expected Discharge Plan: Home/Self Care Barriers to Discharge: Continued Medical Work up   Patient Goals and CMS Choice     Choice offered to / list presented to : NA  Expected Discharge Plan and Services Expected Discharge Plan: Home/Self Care   Discharge Planning Services: CM Consult   Living arrangements for the past 2 months: Single Family Home                   DME Agency: NA       HH Arranged: NA          Prior Living Arrangements/Services Living arrangements for the past 2 months: Single Family Home Lives with:: Siblings Patient language and need for interpreter reviewed:: Yes Do you feel safe going back to the place where you live?: Yes      Need for Family Participation in Patient Care: Yes (Comment) Care giver support system in place?: Yes (comment)   Criminal Activity/Legal Involvement Pertinent to Current Situation/Hospitalization: No - Comment as needed  Activities of Daily Living Home Assistive Devices/Equipment: None ADL Screening (condition at time of admission) Patient's cognitive ability adequate to safely complete daily activities?: Yes Is the patient deaf or have difficulty hearing?: No Does the patient have difficulty seeing, even when wearing glasses/contacts?: No Does the patient have difficulty concentrating, remembering, or making decisions?: No Patient able to express need for assistance with ADLs?: Yes Does the patient  have difficulty dressing or bathing?: No Independently performs ADLs?: Yes (appropriate for developmental age) Does the patient have difficulty walking or climbing stairs?: Yes Weakness of Legs: Both Weakness of Arms/Hands: None  Permission Sought/Granted                  Emotional Assessment       Orientation: : Oriented to Place,Oriented to Self,Oriented to  Time,Oriented to Situation Alcohol / Substance Use: Not Applicable Psych Involvement: No (comment)  Admission diagnosis:  Endocarditis [I38] AKI (acute kidney injury) (HCC) [N17.9] Subacute infective endocarditis, due to unspecified organism [I33.0] Patient Active Problem List   Diagnosis Date Noted  . Fever   . Anemia of chronic disease   . Chronic viral hepatitis B without delta agent and without coma (HCC)   . Antibiotic long-term use   . Malnutrition of moderate degree 06/04/2020  . Hydronephrosis   . Opioid use disorder   . Epidural abscess   . MRSA infection   . Endocarditis 05/21/2020  . Urinary retention 05/21/2020  . AKI (acute kidney injury) (HCC) 05/21/2020  . Hyperkalemia 05/21/2020  . Microcytic anemia 05/21/2020   PCP:  Pcp, No Pharmacy:   [facility] [place], [place] - [address] [place] [address] Phone: [phone] Fax: [phone]     Social Determinants of Health (SDOH) Interventions    Readmission Risk Interventions Readmission Risk Prevention Plan 07/14/2020  Transportation Screening Complete  Medication Review [employer]) Complete  HRI or Home Care Consult Complete  SW Recovery Care/Counseling Consult Complete  Palliative Care Screening Not Applicable  Skilled Nursing Facility Not Applicable  Some recent data might be hidden

## 2020-07-14 NOTE — Discharge Summary (Signed)
Name: Steven Cortez MRN: [id number] DOB: 21-Jul-1975 45 y.o. PCP: [id number]  Date of Admission: 05/21/2020  8:27 AM Date of Discharge: 07/15/2020  Attending Physician: Dr. Dareen Piano  DISCHARGE DIAGNOSIS:  Principal Problem:   Endocarditis Active Problems:   Urinary retention   AKI (acute kidney injury) ([place])   Hyperkalemia   Microcytic anemia   MRSA infection   Epidural abscess   Opioid use disorder   Hydronephrosis   Malnutrition of moderate degree   Antibiotic long-term use   Chronic viral hepatitis B without delta agent and without coma (HCC)   Fever   Anemia of chronic disease    DISCHARGE MEDICATIONS:   Allergies as of 07/15/2020   No Known Allergies     Medication List    TAKE these medications   amLODipine 10 MG tablet Commonly known as: NORVASC Take 1 tablet (10 mg total) by mouth daily.   buprenorphine-naloxone 8-2 mg Subl SL tablet Commonly known as: SUBOXONE Place 1 tablet under the tongue in the morning and at bedtime.   calcium carbonate 500 MG chewable tablet Commonly known as: TUMS - dosed in mg elemental calcium Chew 1 tablet (200 mg of elemental calcium total) by mouth 3 (three) times daily with meals.   carvedilol 25 MG tablet Commonly known as: COREG Take 1 tablet (25 mg total) by mouth 2 (two) times daily with a meal.   diclofenac Sodium 1 % Gel Commonly known as: VOLTAREN Apply 4 g topically 4 (four) times daily.   doxycycline 100 MG tablet Commonly known as: ADOXA Take 1 tablet (100 mg total) by mouth 2 (two) times daily for 24 days.   gabapentin 100 MG capsule Commonly known as: NEURONTIN Take 1 capsule (100 mg total) by mouth 3 (three) times daily.   hydrALAZINE 50 MG tablet Commonly known as: APRESOLINE Take 1 tablet (50 mg total) by mouth every 8 (eight) hours.   losartan 25 MG tablet Commonly known as: COZAAR Take 1 tablet (25 mg total) by mouth daily.   methocarbamol 750 MG tablet Commonly known as: ROBAXIN Take 1  tablet (750 mg total) by mouth 4 (four) times daily.       DISPOSITION AND FOLLOW-UP:  [name] was discharged from [facility] in Stable condition.  At the hospital follow up visit please address:  Follow-up Recommendations:  A. MRSA Endocarditis: Recommendations: Follow up with ID for doxycycline and repeat echo in the following weeks.  Pending work-up: none Labs: none, Imaging: Echocardiogram in the next couple of weeks  B. Epidural abscess : Recommendations: Patient will need to set up a f/u appointment with Judith Part in 2 weeks. Titrate meds for back pain Pending work-up: none Labs: CRP, ESR and CBC, Imaging: none  C. HTN: Recommendations: titrate medications, consider increasing losartan Pending work-up: none Labs: none, Imaging: none  D. AKI: Recommendations: will need to make sure patient is urinating well, check kidney function  Pending work-up: none Labs: Basic Metabolic Profile, Imaging: none  Important changes since admission: none  Follow-up Appointments:  Follow-up Information    [facility] Follow up.   Why: rolling walker thru charity Contact information: [place] [address] [phone]        [place], [place]. Schedule an appointment as soon as possible for a visit in 1 week(s).   Why: Please call to schedule an appointment with [place] within the next 1-2 weeks.  Contact information: [address] [place] [address] [phone]  Jabier Mutton, MD. Daphane Shepherd on 07/21/2020.   Specialty: Infectious Diseases Why: Please go to [facility] for your appointment with Dr. Gale Journey at 10:30am on 07/21/20. Contact information: [address] [place] [address] [phone]        Marianna Payment, MD Follow up on 07/28/2020.   Specialty: Internal Medicine Why: You are scheduled for a Suboxone appointment with the Opioid Use Disorder clinic at 9:15am followed by an  [facility]) appointment with Dr. Marianna Payment at 9:45am. Both are located on the ground floor of [facility]. Contact information: 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  He was found to have a large tricuspid valve vegetation and developed significant ventricular bigeminy.  Cardiology was consulted for possible angiotech.  Patient was found to have PFO which would require balloon closure prior to angio back to prevent septic emboli to his brain.  Due to the increased risk of this operation, the patient elected to continue conservative management with antibiotics alone.  Although, he was counseled that he may still need angio Vac procedure in the outpatient setting depending on his repeat echocardiogram. Therefore, patient was started on daptomycin with improvement of his fevers.  Patient required 2x laminectomies to achieve source control at L4-S1.  Patient required significant pain control with PCA pump and was successfully weaned down to Suboxone prior to discharge.  Patient completed roughly 8 weeks of IV antibiotics from his last surgery.  Patient was given a dose of oritavancin prior to discharge and will be transition to doxycycline 100 mg p.o. twice daily until 07/30/2020.  Patient had a follow-up appointment scheduled with Dr. Gale Journey on 07/21/2020  Acute Kidney Injury on CKD Nephrotic Range Proteinuria Hypoalbuminemia, Improving  Hyperphosphatemia  Patient presented with signs of acute kidney failure in the setting of MRSA bacteremia.  Patient required short of renal replacement therapy with improvement of his  kidney function.  Patient had subsequent decline in kidney function with concern for ATN versus postobstructive nephropathy versus possible glomerular pathology in the setting of significant proteinuria. Urology consulted for cystoscopy with no pathologic findings.  Was found to have a direct versus and Foley catheter was placed.  Patient had good urine output since that time with significant improvement in his lower extremity swelling but required lower extremity TED hose.  Foley catheter was removed with good urine output.  History of IVDU Mr. Sturdevant is interested in continuing suboxone upon discharge with follow up outpatient in our OUD clinic. He does mention some transportation and financial concerns and is currently uninsured although has made arrangements with his brother who intends to help transport him to appointments. Denies withdrawal symptoms on low-dose Suboxone.  -Continue suboxone 2-0.25m BID  -Would benefit from counseling resources at discharge  Hypertension  Patient had hypertension throughout this hospitalization was started on Coreg, amlodipine, hydralazine.  She will need titration of these medications in the outpatient setting  Ventricular Bigeminy Patient had ventricular bigeminy in setting of significant tricuspid vegetation requiring correction.  This resolved prior to discharge.  Opioid use disorder: Patient has history of opiate use disorder  including IV opioid use which precipitated his MRSA bacteremia.  Patient was weaned off of PCA pump onto Suboxone and was discharged with Suboxone 0.5-2 mg and set up in the IM [facility] Suboxone clinic at discharge.  DISCHARGE INSTRUCTIONS:   Discharge Instructions    Call MD for:  difficulty breathing, headache or visual disturbances   Complete by: As directed    Call MD for:  extreme fatigue   Complete by: As directed    Call MD for:  persistant dizziness or light-headedness   Complete by: As directed    Call MD for:   persistant nausea and vomiting   Complete by: As directed    Call MD for:  redness, tenderness, or signs of infection (pain, swelling, redness, odor or green/yellow discharge around incision site)   Complete by: As directed    Call MD for:  severe uncontrolled pain   Complete by: As directed    Call MD for:  temperature >100.4   Complete by: As directed    Diet - low sodium heart healthy   Complete by: As directed    Increase activity slowly   Complete by: As directed    No wound care   Complete by: As directed       SUBJECTIVE:   Discharge Vitals:   BP (!) 141/89 (BP Location: Left Arm)   Pulse 81   Temp 97.6 F (36.4 C) (Oral)   Resp 18   Ht 5' 10"  (1.778 m)   Wt 82 kg   SpO2 99%   BMI 25.93 kg/m   OBJECTIVE:    Pertinent Labs, Studies, and Procedures:  CBC Latest Ref Rng & Units 07/15/2020 07/13/2020 07/09/2020  WBC 4.0 - 10.5 K/uL 7.3 8.2 6.3  Hemoglobin 13.0 - 17.0 g/dL 8.3(L) 8.6(L) 8.0(L)  Hematocrit 39.0 - 52.0 % 26.2(L) 27.5(L) 25.0(L)  Platelets 150 - 400 K/uL 221 215 187    CMP Latest Ref Rng & Units 07/15/2020 07/14/2020 07/12/2020  Glucose 70 - 99 mg/dL 93 92 76  BUN 6 - 20 mg/dL 57(H) 55(H) 48(H)  Creatinine 0.61 - 1.24 mg/dL 2.28(H) 2.42(H) 2.33(H)  Sodium 135 - 145 mmol/L 133(L) 137 137  Potassium 3.5 - 5.1 mmol/L 5.0 4.9 4.5  Chloride 98 - 111 mmol/L 102 105 106  CO2 22 - 32 mmol/L 23 23 23   Calcium 8.9 - 10.3 mg/dL 9.0 9.1 8.5(L)  Total Protein 6.5 - 8.1 g/dL - - -  Total Bilirubin 0.3 - 1.2 mg/dL - - -  Alkaline Phos 38 - 126 U/L - - -  AST 15 - 41 U/L - - -  ALT 0 - 44 U/L - - -    US RENAL  Result Date: 05/21/2020 CLINICAL DATA:  AK I EXAM: RENAL / URINARY TRACT ULTRASOUND COMPLETE COMPARISON:  May 17, 2020 FINDINGS: Right Kidney: Renal measurements: 12.5 x 5.0 x 5.6 cm = volume: 182 mL. Diffusely increased renal echogenicity. There is mild RIGHT hydronephrosis, similar in comparison to prior. Left Kidney: Renal measurements: 13.1 x 5.5 x  5.9 cm = volume: 222 mL. Renal echogenicity is diffusely increased. There is minimal LEFT hydronephrosis. Bladder: Distended Other: Small volume ascites. Splenomegaly the spleen measuring at least 14 x 13.5 x 8 cm for a volume of 792 ML. Small LEFT pleural effusion. IMPRESSION: 1. There is mild RIGHT greater than LEFT hydronephrosis. The bladder is distended. Recommend correlation for outlet obstruction. 2. Splenomegaly. 3. Trace ascites and small LEFT pleural effusion. 4. Diffusely increased renal echogenicity  as can be seen in medical renal disease. Electronically Signed   By: Valentino Saxon MD   On: 05/21/2020 13:52   IR Fluoro Guide CV Line Right  Result Date: 05/22/2020 INDICATION: 45 year old male referred for non tunneled hemodialysis catheter EXAM: CENTRAL VENOUS HEMODIALYSIS CATHETER PLACEMENT WITH ULTRASOUND AND FLUOROSCOPIC GUIDANCE MEDICATIONS: NONE ANESTHESIA/SEDATION: None FLUOROSCOPY TIME:  Fluoroscopy Time: 0 minutes 6 seconds (1 mGy). COMPLICATIONS: None PROCEDURE: Informed written consent was obtained from the patient's family after a discussion of the risks, benefits, and alternatives to treatment. Questions regarding the procedure were encouraged and answered. The right neck was prepped with chlorhexidine in a sterile fashion, and a sterile drape was applied covering the operative field. Maximum barrier sterile technique with sterile gowns and gloves were used for the procedure. A timeout was performed prior to the initiation of the procedure. A micropuncture kit was utilized to access the right internal jugular vein under direct, real-time ultrasound guidance after the overlying soft tissues were anesthetized with 1% lidocaine with epinephrine. Ultrasound image documentation was performed. The microwire was kinked to measure appropriate catheter length. A stiff glidewire was advanced to the level of the IVC. A 16 cm hemodialysis catheter was then placed over the wire. Final catheter  positioning was confirmed and documented with a spot radiographic image. The catheter aspirates and flushes normally. The catheter was flushed with appropriate volume heparin dwells. Dressings were applied. The patient tolerated the procedure well without immediate post procedural complication. IMPRESSION: Status post image guided placement of right IJ temporary triple-lumen hemodialysis catheter. Signed, Dulcy Fanny. Dellia Nims, RPVI [facility] Radiology Electronically Signed   By: Corrie Mckusick D.O.   On: 05/22/2020 14:12   IR US Guide Vasc Access Right  Result Date: 05/26/2020 INDICATION: 45 year old male referred for non tunneled hemodialysis catheter EXAM: CENTRAL VENOUS HEMODIALYSIS CATHETER PLACEMENT WITH ULTRASOUND AND FLUOROSCOPIC GUIDANCE MEDICATIONS: NONE ANESTHESIA/SEDATION: None FLUOROSCOPY TIME:  Fluoroscopy Time: 0 minutes 6 seconds (1 mGy). COMPLICATIONS: None PROCEDURE: Informed written consent was obtained from the patient's family after a discussion of the risks, benefits, and alternatives to treatment. Questions regarding the procedure were encouraged and answered. The right neck was prepped with chlorhexidine in a sterile fashion, and a sterile drape was applied covering the operative field. Maximum barrier sterile technique with sterile gowns and gloves were used for the procedure. A timeout was performed prior to the initiation of the procedure. A micropuncture kit was utilized to access the right internal jugular vein under direct, real-time ultrasound guidance after the overlying soft tissues were anesthetized with 1% lidocaine with epinephrine. Ultrasound image documentation was performed. The microwire was kinked to measure appropriate catheter length. A stiff glidewire was advanced to the level of the IVC. A 16 cm hemodialysis catheter was then placed over the wire. Final catheter positioning was confirmed and documented with a spot  radiographic image. The catheter aspirates and flushes normally. The catheter was flushed with appropriate volume heparin dwells. Dressings were applied. The patient tolerated the procedure well without immediate post procedural complication. IMPRESSION: Status post image guided placement of right IJ temporary triple-lumen hemodialysis catheter. Signed, Dulcy Fanny. Dellia Nims, RPVI [facility] Radiology Electronically Signed   By: Corrie Mckusick D.O.   On: 05/22/2020 14:12   DG Chest Port 1 View  Result Date: 05/21/2020 CLINICAL DATA:  Chest pain and shortness of breath EXAM: PORTABLE CHEST 1 VIEW COMPARISON:  May 15, 2020 chest radiograph and chest CT May 18, 2020 FINDINGS: There are again noted multiple nodular lesions throughout the lungs bilaterally, several of which are cavitated. There is a small right pleural effusion. There is ill-defined airspace opacity in the right base which appears increased from recent studies. Heart size and pulmonary vascular normal. No adenopathy. Degenerative changes noted in the left shoulder. IMPRESSION: Multiple ill-defined nodular opacities of varying sizes, several of which show cavitation, likely representing multifocal septic emboli. These nodular opacities overall appear similar to findings on most recent CT. There is a small right pleural effusion with slight increase in airspace opacity in the right base, likely focus of pneumonia. Stable cardiac silhouette. Electronically Signed   By: Lowella Grip III M.D.   On: 05/21/2020 09:02   ECHOCARDIOGRAM COMPLETE  Result Date: 05/22/2020    ECHOCARDIOGRAM REPORT   Patient Name:   EITAN DOUBLEDAY Date of Exam: 05/22/2020 Medical Rec #:  [id number]      Height:       70.0 in Accession #:    [id number]     Weight:       180.3 lb Date of Birth:  [birth year]      BSA:          1.998 m Patient Age:    63 years       BP:           148/80 mmHg Patient Gender: M               HR:           90 bpm. Exam Location:  Inpatient Procedure: 2D Echo, Cardiac Doppler and Color Doppler  Reported to: Dr. Debara Pickett on 05/22/2020 3:20:00 AM      Mass visualized on Tricuspid valve. Indications:    Endocarditis. I38  History:        Patient has no prior history of Echocardiogram examinations.  Sonographer:    Jonelle Sidle Dance Referring Phys: [id number] Raynaldo Opitz RAINES IMPRESSIONS  1. Left ventricular ejection fraction, by estimation, is 60 to 65%. The left ventricle has normal function. The left ventricle has no regional wall motion abnormalities. There is mild left ventricular hypertrophy. Left ventricular diastolic parameters were normal.  2. Right ventricular systolic function is normal. The right ventricular size is normal.  3. The mitral valve is normal in structure. No evidence of mitral valve regurgitation.  4. Large mobile echogenic mass noted on the RV side of the tricuspid valve septal leaflet, measuring 1 x 3 cm, suggestive of vegetation. The tricuspid valve is abnormal.  5. The aortic valve is tricuspid. Aortic valve regurgitation is not visualized.  6. The inferior vena cava is normal in size with greater than 50% respiratory variability, suggesting right atrial pressure of 3 mmHg. Comparison(s): No prior Echocardiogram. Conclusion(s)/Recommendation(s): Findings demonstrate tricuspid valve mass likley vegetation, would recommend a Transesophageal Echocardiogram for further evaluation. Critical findings reported to Dr. Rebeca Alert and acknowledged at 05/22/2020 at 3:56 pm. FINDINGS  Left Ventricle: Left ventricular ejection fraction, by estimation, is 60 to 65%. The left ventricle has normal function. The left ventricle has no regional wall motion abnormalities. The left ventricular internal cavity size was normal in size. There is  mild left ventricular hypertrophy. Left ventricular diastolic parameters were normal. Right Ventricle: The right ventricular size is normal. No increase in right ventricular  wall thickness. Right ventricular systolic function is normal. Left Atrium: Left atrial size was normal in size. Right Atrium: Right atrial size was normal in size. Pericardium: There is no evidence of pericardial  effusion. Mitral Valve: The mitral valve is normal in structure. No evidence of mitral valve regurgitation. Tricuspid Valve: Large mobile echogenic mass noted on the RV side of the tricuspid valve septal leaflet, measuring 1 x 3 cm, suggestive of vegetation. The tricuspid valve is abnormal. Tricuspid valve regurgitation is mild. Aortic Valve: The aortic valve is tricuspid. Aortic valve regurgitation is not visualized. Pulmonic Valve: The pulmonic valve was normal in structure. Pulmonic valve regurgitation is not visualized. Aorta: The aortic root and ascending aorta are structurally normal, with no evidence of dilitation. Venous: The inferior vena cava is normal in size with greater than 50% respiratory variability, suggesting right atrial pressure of 3 mmHg. IAS/Shunts: No atrial level shunt detected by color flow Doppler.  LEFT VENTRICLE PLAX 2D LVIDd:         4.90 cm  Diastology LVIDs:         3.70 cm  LV e' medial:    12.20 cm/s LV PW:         1.30 cm  LV E/e' medial:  8.7 LV IVS:        0.80 cm  LV e' lateral:   14.30 cm/s LVOT diam:     2.00 cm  LV E/e' lateral: 7.4 LV SV:         96 LV SV Index:   48 LVOT Area:     3.14 cm  RIGHT VENTRICLE             IVC RV Basal diam:  2.30 cm     IVC diam: 2.00 cm RV S prime:     17.70 cm/s TAPSE (M-mode): 2.8 cm LEFT ATRIUM             Index       RIGHT ATRIUM           Index LA diam:        4.20 cm 2.10 cm/m  RA Area:     16.50 cm LA Vol (A2C):   71.2 ml 35.64 ml/m RA Volume:   43.30 ml  21.68 ml/m LA Vol (A4C):   61.3 ml 30.69 ml/m LA Biplane Vol: 67.1 ml 33.59 ml/m  AORTIC VALVE LVOT Vmax:   168.00 cm/s LVOT Vmean:  116.000 cm/s LVOT VTI:    0.306 m  AORTA Ao Root diam: 3.40 cm Ao Asc diam:  3.20 cm MITRAL VALVE MV Area (PHT): 2.91 cm     SHUNTS MV  Decel Time: 261 msec     Systemic VTI:  0.31 m MV E velocity: 106.00 cm/s  Systemic Diam: 2.00 cm MV A velocity: 82.00 cm/s MV E/A ratio:  1.29 Lyman Bishop MD Electronically signed by Lyman Bishop MD Signature Date/Time: 05/22/2020/3:59:45 PM    Final      Signed: Lawerance Cruel, D.O.  Internal Medicine Resident, PGY-2 [facility]  Pager: [phone] 2:45 PM, 07/16/2020

## 2020-07-14 NOTE — Progress Notes (Signed)
HD#54 Subjective:   Steven Cortez states that he is feeling very well today, eager to go home tomorrow. He was able to walk the halls well with PT early this morning. Notes his bilateral LE swelling seems worse today although had been sitting for 4 hours following our evaluation this morning. Denies any new back or leg pain, CP, or any other symptoms. He notes he prefers oral pills rather than weekly infusions for continued outpatient treatment. He says he has a history of HCV that has never been treated. Denies any current symptoms of withdrawal and was informed of plans for his follow up appointments.   Objective:  Vital signs in last 24 hours: Vitals:   07/14/20 0023 07/14/20 0300 07/14/20 0357 07/14/20 0400  BP: 135/85  (!) 149/97   Pulse: 76     Resp: 17 14 16 18   Temp: 97.8 F (36.6 C)  (!) 97.5 F (36.4 C)   TempSrc: Oral  Oral   SpO2: 99%  100%   Weight: 79.8 kg     Height:       Supplemental O2: Room Air SpO2 98%   Physical Exam:   General: Patient appears well, in no acute distress.  HENT: Poor dentition. Moist mucus membranes.  Respiratory: Lungs CTA bilaterally without wheezes, rales or rhonchi.  Cardiovascular: Rate is normal. There is a 2/6 systolic murmur heard along the left lower sternal border. 2/6 mitral regurgitant murmur present. There is 2-3+ bilateral lower extremity pitting edema to just below the knees.  Musculoskeletal: Able to bend at the spine and move all four extremities well.  Psychiatric: Pleasant and cooperative.   Filed Weights   07/11/20 0033 07/12/20 0321 07/14/20 0023  Weight: 81.7 kg 83.4 kg 79.8 kg     Intake/Output Summary (Last 24 hours) at 07/14/2020 0622 Last data filed at 07/14/2020 0357 Gross per 24 hour  Intake 1791 ml  Output 1800 ml  Net -9 ml   Net IO Since Admission: -75,209.1 mL [07/14/20 0622]  No results for input(s): GLUCAP in the last 72 hours.   Pertinent Labs: CBC Latest Ref Rng & Units 07/13/2020 07/09/2020  07/06/2020  WBC 4.0 - 10.5 K/uL 8.2 6.3 5.8  Hemoglobin 13.0 - 17.0 g/dL 07/08/2020) 2.2(L) 7.6(L)  Hematocrit 39.0 - 52.0 % 27.5(L) 25.0(L) 23.8(L)  Platelets 150 - 400 K/uL 215 187 181    CMP Latest Ref Rng & Units 07/14/2020 07/12/2020 07/11/2020  Glucose 70 - 99 mg/dL 92 76 88  BUN 6 - 20 mg/dL 07/13/2020) 92(J) 19(E)  Creatinine 0.61 - 1.24 mg/dL 17(E) 0.81(K) 4.81(E)  Sodium 135 - 145 mmol/L 137 137 139  Potassium 3.5 - 5.1 mmol/L 4.9 4.5 4.6  Chloride 98 - 111 mmol/L 105 106 109  CO2 22 - 32 mmol/L 23 23 25   Calcium 8.9 - 10.3 mg/dL 9.1 5.63(J) 8.3(L)  Total Protein 6.5 - 8.1 g/dL - - -  Total Bilirubin 0.3 - 1.2 mg/dL - - -  Alkaline Phos 38 - 126 U/L - - -  AST 15 - 41 U/L - - -  ALT 0 - 44 U/L - - -    Imaging: VAS ABI WITH/WO TBI  Right: Resting right ankle-brachial index indicates noncompressible right lower extremity arteries. Left: Resting left ankle-brachial index is within normal range. No evidence of significant left lower extremity arterial disease.      Assessment/Plan:   Principal Problem:   Endocarditis Active Problems:   Urinary retention   AKI (acute  kidney injury) (HCC)   Hyperkalemia   Microcytic anemia   MRSA infection   Epidural abscess   Opioid use disorder   Hydronephrosis   Malnutrition of moderate degree   Antibiotic long-term use   Chronic viral hepatitis B without delta agent and without coma (HCC)   Fever   Anemia of chronic disease   Patient Summary: Steven Cortez is a 45 y.o. 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 Hospital course complicated by acute renal failure requiring HD and large epidural abscess s/p laminectomy x2 (1/29 and 2/11). Now on long course IV abx treatment.   MRSA TV Endocarditis w/ Bacteremia Complicated by Septic Emboli and Epidural Abscess s/p I&D w/ laminectomy x 2 Remains afebrile without leukocytosis on IV Daptomycin  (intended end date 01-Aug-2020 for 8 weeks of IV therapy). He is eager to return home tomorrow for his mother's birthday. Most recent MRI 07/09/20 shows expected continued radiographic persistence of OM/discitis and septic arthritis, although is overall improved without frank infectious collection seen.  -ID following; appreciate their recommendations  -Plan to start Oritavancin IV tonight  -Transition to PO doxycycline 1 week following infusion  -Scheduled tylenol 1g q8 hours -Continue Suboxone 2-0.5mg  SL BID  -Continue Voltaren gel 4mg  QID  -Continue gabapentin 100mg  TID -Continue Robaxin 750mg  four times daily -PT/OT recommend 5" rolling walker without need for outpatient PT/OT  -Will require repeat ECHO after completion of ABX and possible CT surgery follow up if vegetation persists  - Will require close outpatient follow up with ID   Acute Kidney Injury on CKD Nephrotic Range Proteinuria Hypoalbuminemia, Improving  Hyperphosphatemia  Creatinine appears to have stabilized around 2.4 with GFR in the low 30's over the past week. Continued to have significant proteinuria, last checked 07/09/20. Albumin continues to increase slowly, although BLE's remain edematous, likely due to nephrosis. -Renal function has stabilized  -Will start Losartan 25mg  daily  -Repeat morning RFP and protein:creatinine  -Strict I&O's -Continue knee-high TED stockings as tolerated  -Will require close outpatient lab monitoring and nephrology follow up  History of IVDU Steven Cortez is interested in continuing suboxone upon discharge with follow up outpatient in our OUD clinic. He does mention some transportation and financial concerns and is currently uninsured although has made arrangements with his brother who intends to help transport him to appointments. Denies withdrawal symptoms on low-dose Suboxone.  -Continue suboxone 2-0.5mg  BID  -Would benefit from counseling resources at discharge  Hypertension  Patient  continues to be hypertensive despite current antihypertensive regimen.  - Continue Coreg, amlodipine, and hydralazine  - Start Losartan 25mg  daily  - Continue to monitor   Ventricular Bigeminy, Resolved  - continue Coreg 25mg  BID   Diet: 2g Na without Fluid Restriction  IVF: PO intake VTE: Lovenox Code: Full PT/OT: No follow up needed   Anticipated discharge to home tomorrow afternoon.  , MD Internal Medicine Resident, PGY-1 07/11/20 Internal Medicine Residency  Pager: [phone] 6:22 AM, 07/14/2020   Please contact the on call pager after 5 pm and on weekends at [phone].

## 2020-07-15 ENCOUNTER — Other Ambulatory Visit: Payer: Self-pay | Admitting: Internal Medicine

## 2020-07-15 LAB — RENAL FUNCTION PANEL
Albumin: 2.8 g/dL — ABNORMAL LOW (ref 3.5–5.0)
Anion gap: 8 (ref 5–15)
BUN: 57 mg/dL — ABNORMAL HIGH (ref 6–20)
CO2: 23 mmol/L (ref 22–32)
Calcium: 9 mg/dL (ref 8.9–10.3)
Chloride: 102 mmol/L (ref 98–111)
Creatinine, Ser: 2.28 mg/dL — ABNORMAL HIGH (ref 0.61–1.24)
GFR, Estimated: 35 mL/min — ABNORMAL LOW (ref >60)
Glucose, Bld: 93 mg/dL (ref 70–99)
Phosphorus: 6.1 mg/dL — ABNORMAL HIGH (ref 2.5–4.6)
Potassium: 5 mmol/L (ref 3.5–5.1)
Sodium: 133 mmol/L — ABNORMAL LOW (ref 135–145)

## 2020-07-15 LAB — PROTEIN / CREATININE RATIO, URINE
Creatinine, Urine: 41.94 mg/dL
Protein Creatinine Ratio: 9.85 mg/mg{Cre} — ABNORMAL HIGH (ref 0.00–0.15)
Total Protein, Urine: 413 mg/dL

## 2020-07-15 LAB — CBC
HCT: 26.2 % — ABNORMAL LOW (ref 39.0–52.0)
Hemoglobin: 8.3 g/dL — ABNORMAL LOW (ref 13.0–17.0)
MCH: 28.5 pg (ref 26.0–34.0)
MCHC: 31.7 g/dL (ref 30.0–36.0)
MCV: 90 fL (ref 80.0–100.0)
Platelets: 221 10*3/uL (ref 150–400)
RBC: 2.91 MIL/uL — ABNORMAL LOW (ref 4.22–5.81)
RDW: 18.1 % — ABNORMAL HIGH (ref 11.5–15.5)
WBC: 7.3 10*3/uL (ref 4.0–10.5)
nRBC: 0 % (ref 0.0–0.2)

## 2020-07-15 MED ORDER — BUPRENORPHINE HCL-NALOXONE HCL 8-2 MG SL FILM: 1.0000 | ORAL_FILM | Freq: Two times a day (BID) | SUBLINGUAL | 0 refills | Status: DC

## 2020-07-15 MED ORDER — BUPRENORPHINE HCL-NALOXONE HCL 8-2 MG SL SUBL: 1.0000 | SUBLINGUAL_TABLET | Freq: Two times a day (BID) | SUBLINGUAL | 0 refills | Status: AC

## 2020-07-15 MED FILL — BUPREN-NALOX SL 8-2MG tab: 8-2 | 12 days supply | Qty: 25 | Fill #0

## 2020-07-15 NOTE — Progress Notes (Signed)
Received message from Dr. Laddie Aquas regarding plans for hospital discharge requesting assistance with suboxone prescription. Patient is well known to me, I cared for him for 4 weeks on the inpatient service last month. He has a long history of severe OUD previously injecting crushed Roxicodone. He has been on methadone in the past but has never tried suboxone. While on service, I discussed starting suboxone treatment with him on discharge and he was very interested. He was weaned off oral and IV opioids and started on induction dose of suboxone yesterday while in the hospital. Per report, he tolerated this well without symptoms of withdrawal.   I have sent a prescription for suboxone 8-2 mg sublingual tablets BID to the [facility] pharmacy. They only had 25 tablets available. He has follow up scheduled with our suboxone clinic in 2 weeks (next available 4/5). He can start with 1/2 tablet BID and self titrate up to 1 tablet BID if needed to control cravings. Hopefully this will last until that appointment but he may need an additional prescription to bridge him if he runs out. He is uninsured so future prescription will need to be sent to the [facility] outpatient pharmacy ($4 IM program). Discussed plan with Dr. Laddie Aquas.

## 2020-07-15 NOTE — Progress Notes (Signed)
D/C instructions given and reviewed. Pt's ride is at main entrance. Primary RN notified.

## 2020-07-15 NOTE — Plan of Care (Signed)

## 2020-07-15 NOTE — Progress Notes (Signed)
HD#55 Subjective:   Patient reports feeling well today. Reports he is ready to go home. Discussed that he will be continued on antibiotics outpatient. And that we will be able to continue him on Suboxone and have him follow up in our OUD clinic. All questions and concerns were answered.   Objective:  Vital signs in last 24 hours: Vitals:   07/15/20 0000 07/15/20 0015 07/15/20 0410 07/15/20 0840  BP:  (!) 139/98 (!) 141/89   Pulse:  78 83 81  Resp: 11 14 18    Temp:  98.1 F (36.7 C) 98.4 F (36.9 C) 97.6 F (36.4 C)  TempSrc:  Oral Oral Oral  SpO2:  100% 98% 99%  Weight:   82 kg   Height:       Supplemental O2: Room Air SpO2 98%   Physical Exam:   General: Patient appears well, in no acute distress.  Respiratory:CTABL, no wheezing, rhonchi, rales Cardiovascular: Rate is normal. There is a 2/6 systolic murmur heard along the left lower sternal border. 2/6 mitral regurgitant murmur present. There is 1+ bilateral lower extremity pitting edema to just below the knees.  Musculoskeletal: Able to bend at the spine and move all four extremities well.  Psychiatric: Pleasant and cooperative.   Filed Weights   07/12/20 0321 07/14/20 0023 07/15/20 0410  Weight: 83.4 kg 79.8 kg 82 kg     Intake/Output Summary (Last 24 hours) at 07/15/2020 1852 Last data filed at 07/15/2020 1315 Gross per 24 hour  Intake 1200 ml  Output 4000 ml  Net -2800 ml   Net IO Since Admission: -78,469.1 mL [07/15/20 1852]  No results for input(s): GLUCAP in the last 72 hours.   Pertinent Labs: CBC Latest Ref Rng & Units 07/15/2020 07/13/2020 07/09/2020  WBC 4.0 - 10.5 K/uL 7.3 8.2 6.3  Hemoglobin 13.0 - 17.0 g/dL 8.3(L) 8.6(L) 8.0(L)  Hematocrit 39.0 - 52.0 % 26.2(L) 27.5(L) 25.0(L)  Platelets 150 - 400 K/uL 221 215 187    CMP Latest Ref Rng & Units 07/15/2020 07/14/2020 07/12/2020  Glucose 70 - 99 mg/dL 93 92 76  BUN 6 - 20 mg/dL 07/14/2020) 22(W) 97(L)  Creatinine 0.61 - 1.24 mg/dL 89(Q) 1.19(E)  1.74(Y)  Sodium 135 - 145 mmol/L 133(L) 137 137  Potassium 3.5 - 5.1 mmol/L 5.0 4.9 4.5  Chloride 98 - 111 mmol/L 102 105 106  CO2 22 - 32 mmol/L 23 23 23   Calcium 8.9 - 10.3 mg/dL 9.0 9.1 8.14(G)  Total Protein 6.5 - 8.1 g/dL - - -  Total Bilirubin 0.3 - 1.2 mg/dL - - -  Alkaline Phos 38 - 126 U/L - - -  AST 15 - 41 U/L - - -  ALT 0 - 44 U/L - - -    Imaging: VAS ABI WITH/WO TBI  Right: Resting right ankle-brachial index indicates noncompressible right lower extremity arteries. Left: Resting left ankle-brachial index is within normal range. No evidence of significant left lower extremity arterial disease.      Assessment/Plan:   Principal Problem:   Endocarditis Active Problems:   Urinary retention   AKI (acute kidney injury) (HCC)   Hyperkalemia   Microcytic anemia   MRSA infection   Epidural abscess   Opioid use disorder   Hydronephrosis   Malnutrition of moderate degree   Antibiotic long-term use   Chronic viral hepatitis B without delta agent and without coma (HCC)   Fever   Anemia of chronic disease   Patient Summary: Steven Cortez  is a 45 y.o. 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 Hospital course complicated by acute renal failure requiring HD and large epidural abscess s/p laminectomy x2 (1/29 and 2/11). Now on long course IV abx treatment.   MRSA TV Endocarditis w/ Bacteremia Complicated by Septic Emboli and Epidural Abscess s/p I&D w/ laminectomy x 2 Remains afebrile without leukocytosis on IV Daptomycin (intended end date 2020-08-16 for 8 weeks of IV therapy). He is eager to return home today. Most recent MRI 07/09/20 shows expected continued radiographic persistence of OM/discitis and septic arthritis, although is overall improved without frank infectious collection seen.   -ID following; appreciate their recommendations  -Received Oritavancin IV 3/22 -Transition to PO  doxycycline 1 week following infusion  -Scheduled tylenol 1g q8 hours -Continue Suboxone 2-0.5mg  SL BID  -Continue Voltaren gel 4mg  QID  -Continue gabapentin 100mg  TID -Continue Robaxin 750mg  four times daily -PT/OT recommend 5" rolling walker without need for outpatient PT/OT  -Will require repeat ECHO after completion of ABX and possible CT surgery follow up if vegetation persists outpatient - Will require close outpatient follow up with ID  -Plan for discharge today  Acute Kidney Injury on CKD Nephrotic Range Proteinuria Hypoalbuminemia, Improving  Hyperphosphatemia  Creatinine appears to have stabilized around 2.4, down to 2.28 today.   -Renal function has stabilized  -Continue Losartan 25mg  daily  -Strict I&O's -Continue knee-high TED stockings as tolerated  -Will require close outpatient lab monitoring and nephrology follow up  History of IVDU Mr. Vanduyne is interested in continuing suboxone upon discharge with follow up outpatient in our OUD clinic. He does mention some transportation and financial concerns and is currently uninsured although has made arrangements with his brother who intends to help transport him to appointments. Denies withdrawal symptoms on low-dose Suboxone.  -Continue suboxone 2-0.5mg  BID, provided prescription on discharge -Would benefit from counseling resources at discharge -F/u in IMT[facility] OUD clinic  Hypertension  Patient continues to be hypertensive despite current antihypertensive regimen.  - Continue Coreg, amlodipine, and hydralazine  - Continue Losartan 25mg  daily  - Continue to monitor   Ventricular Bigeminy, Resolved  - continue Coreg 25mg  BID   Diet: 2g Na without Fluid Restriction  IVF: PO intake VTE: Lovenox Code: Full PT/OT: No follow up needed   Anticipated discharge to home today.  , M.D. PGY3 Pager [phone] 07/15/2020 6:56 PM 6:52 PM, 07/15/2020   Please contact the on call pager after 5 pm and on  weekends at [phone].

## 2020-07-15 NOTE — Progress Notes (Signed)
Occupational Therapy Treatment Patient Details Name: Steven Cortez MRN: [id number] DOB: 1975-07-15 Today's Date: 07/15/2020    History of present illness Pt is a 45 y.o. male recently admitted to [facility] and found to have tricuspid endocarditis, MRSA bacteremia, septic PE, but left AMA; now admitted 05/21/20 with weakness, SOB, chest pain, edema. 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 S/p laminectomy L4-S1 and evacuation of abscess on 1/29. S/p TEE 2/3. S/p repeat evacuation/extension of decompression on 2/11. Lumbar MRI 3/17 showed small fluid collection over L4-5 incision, but improved phlegmon between L4-S1 epidural space. PMH includes Hep C, IVDU, tobacco use.   OT comments  Pt making steady progress towards OT goals this session. Session focus on compensatory methods for LB dressing and working on IADLs to facilitate higher level functional mobility tasks. Education provided on using friction reducing device on bilateral feet when donning TED hose with pt appreciative of education and able to teach back technique to COTA. Pt completed functional mobility with no AD with supervision with no LOB, pt even able to retrieve coffee from nurses station and transport cup back to room with no LOB or spillage noted. Pt hopefully to DC home today with brother. Will follow acutely per POC for OT needs.    Follow Up Recommendations  No OT follow up;Supervision/Assistance - 24 hour    Equipment Recommendations  None recommended by OT    Recommendations for Other Services      Precautions / Restrictions Precautions Precautions: Back Precaution Booklet Issued: No Precaution Comments: pt maintaining precautions throughout session with no cues Restrictions Weight Bearing Restrictions: No       Mobility Bed Mobility               General bed mobility comments: pt OOB in recliner and returned to recliner at end of session     Transfers Overall transfer level: Modified independent Equipment used: None Transfers: Sit to/from Stand   Stand pivot transfers: Modified independent (Device/Increase time)       General transfer comment: increased time and effort from sitting still in recliner for too long    Balance Overall balance assessment: Needs assistance Sitting-balance support: No upper extremity supported;Feet supported Sitting balance-Leahy Scale: Good     Standing balance support: During functional activity;No upper extremity supported Standing balance-Leahy Scale: Fair Standing balance comment: pt ambulating with no AD able to reach out to retrieve coffee from front desk and transport coffee back to room with no spillage or LOB                           ADL either performed or assessed with clinical judgement   ADL Overall ADL's : Needs assistance/impaired                   Upper Body Dressing Details (indicate cue type and reason): pt dressed in [name] shirt upon arrival, reporting that pt donned shirt Independently   Lower Body Dressing Details (indicate cue type and reason): education provided on compensatory methods for caregivers when donning TED hose, pt appreciative of technique and able to teach back to COTA. Toilet Transfer: Supervision/safety;Ambulation Toilet Transfer Details (indicate cue type and reason): pt ambulating without AD this session with gross supervision, pt even able to retrieve and transports coffee back to room with no LOB or spillage noted         Functional mobility during ADLs: Supervision/safety General ADL Comments: pt making  continued excellent progress with OT goals with pt able to complete functional mobility with gross supervision and no AD, education provided on compensatory methods for donning TED hose with pt appreciative of education     Vision       Perception     Praxis      Cognition Arousal/Alertness: Awake/alert Behavior  During Therapy: WFL for tasks assessed/performed Overall Cognitive Status: Within Functional Limits for tasks assessed                                 General Comments: eager and excited to DC home but continuing to ask appropriate questions        Exercises     Shoulder Instructions       General Comments VSS    Pertinent Vitals/ Pain       Pain Assessment: Faces Faces Pain Scale: Hurts little more Pain Location: back Pain Descriptors / Indicators: Sore Pain Intervention(s): Monitored during session  Home Living                                          Prior Functioning/Environment              Frequency  Min 2X/week        Progress Toward Goals  OT Goals(current goals can now be found in the care plan section)  Progress towards OT goals: Progressing toward goals  Acute Rehab OT Goals Patient Stated Goal: to go home OT Goal Formulation: With patient Time For Goal Achievement: 07/20/20 (seen by OTR 3/14) Potential to Achieve Goals: Good  Plan Frequency remains appropriate;Discharge plan remains appropriate    Co-evaluation                 AM-PAC OT "6 Clicks" Daily Activity     Outcome Measure   Help from another person eating meals?: None Help from another person taking care of personal grooming?: None Help from another person toileting, which includes using toliet, bedpan, or urinal?: None Help from another person bathing (including washing, rinsing, drying)?: A Little Help from another person to put on and taking off regular upper body clothing?: None Help from another person to put on and taking off regular lower body clothing?: A Little 6 Click Score: 22    End of Session    OT Visit Diagnosis: Unsteadiness on feet (R26.81);Other abnormalities of gait and mobility (R26.89);Muscle weakness (generalized) (M62.81);Pain Pain - part of body:  (back)   Activity Tolerance Patient tolerated treatment well    Patient Left in chair;with call bell/phone within reach   Nurse Communication Mobility status        Time: 9678-9381 OT Time Calculation (min): 17 min  Charges: OT General Charges $OT Visit: 1 Visit OT Treatments $Self Care/Home Management : 8-22 mins  Lenor Derrick., COTA/L Acute Rehabilitation Services [phone]    Barron Schmid 07/15/2020, 1:29 PM

## 2020-07-15 NOTE — Progress Notes (Signed)
Pt had 18 beats of V tach. Pt asymptomatic and sleeping in bed. MD paged.

## 2020-07-15 NOTE — Discharge Instructions (Signed)
Endocarditis  Endocarditis is an infection of the heart valves or an infection of the inner layer of the heart (endocardium). Endocarditis can cause growths on the heart valves or inside the heart. Over time, these growths can destroy heart tissue and cause heart failure or problems with the heart rhythm. They can also cause a stroke if they break away and block an artery in the brain. Early treatment offers the best chance for curing endocarditis and preventing complications. What are the causes? This condition may be caused by:  Germs that normally live in or on your body. The germs that most commonly cause endocarditis are bacteria.  Fungus.  Cancer.  Connective tissue disease. What increases the risk? This condition is more likely to develop in people who have:  A heart defect.  Artificial (prosthetic) heart valves.  An abnormal or damaged heart valve.  A history of endocarditis.  IV drug abuse. Having certain procedures may also increase the risk of germs getting into the heart or bloodstream. What are the signs or symptoms? Symptoms of this condition may start suddenly, or they may start slowly and gradually get worse. Symptoms include:  Fever.  Chills.  Night sweats.  Muscle aches.  Fatigue.  Weakness.  Shortness of breath.  Chest pain.  Blood spots in the eyes.  Bleeding under the fingernails or toenails.  Painless red spots on the palms.  Painful lumps in the fingertips or toes.  Swelling in the feet or ankles. How is this diagnosed? This condition may be diagnosed based on:  A physical exam. Your health care provider will listen to your heart to check for abnormal heart sounds (murmur). He or she may also use a scope to check for bleeding at the back of your eyes (retinas).  Tests. They may include: ? Blood tests to look for the germs that cause endocarditis. ? Imaging tests. These include a chest X-ray, CT scan, or echocardiogram. A type of  echocardiogram called a transesophageal echocardiogram may be done to look at heart valves more closely. How is this treated? Treatment for this condition depends on the cause of the endocarditis. Treatment may include:  Antibiotic medicines. These may be given through an IV line or taken by mouth. You may need to be on more than one antibiotic medicine.  Surgery to replace your heart valve. You may need surgery if: ? The endocarditis does not respond to treatment. ? You develop complications. ? Your heart valve is severely damaged. Follow these instructions at home: Medicines  Take over-the-counter and prescription medicines only as told by your health care provider.  If you were prescribed an antibiotic medicine, take it as told by your health care provider. Do not stop taking the antibiotic even if you start to feel better. You may need to be on intravenous or oral antibiotics for several weeks.  Do not use IV drugs unless it is part of your medical treatment. Lifestyle  Do not get tattoos or body piercings.  Practice good oral hygiene. This includes: ? Brushing and flossing regularly. ? Scheduling routine dental appointments.  Do not use any products that contain nicotine or tobacco, such as cigarettes, e-cigarettes, and chewing tobacco. If you need help quitting, ask your health care provider.  If you drink alcohol: ? Limit how much you use to:  0-1 drink a day for women.  0-2 drinks a day for men. ? Be aware of how much alcohol is in your drink. In the [place] one drink equals  one typical bottle of beer (12 oz), one-half glass of wine (5 oz), or one shot of hard liquor (1 oz). General instructions  Let your health care provider know before you have any dental or surgical procedures. You may need to take antibiotics before the procedure.  Tell all of your health care providers, including your dentist, that you have had endocarditis.  Gradually resume your usual  activities.  Keep all follow-up visits as told by your health care provider. This is important. Contact a health care provider if:  You have a fever.  Your symptoms do not improve.  Your symptoms get worse.  Your symptoms come back. Get help right away if:  You have trouble breathing.  You have chest pain.  You have any symptoms of a stroke. "BE FAST" is an easy way to remember the main warning signs of a stroke: ? B - Balance. Signs are dizziness, sudden trouble walking, or loss of balance. ? E - Eyes. Signs are trouble seeing or a sudden change in vision. ? F - Face. Signs are sudden weakness or numbness of the face, or the face or eyelid drooping on one side. ? A - Arms. Signs are weakness or numbness in an arm. This happens suddenly and usually on one side of the body. ? S - Speech. Signs are sudden trouble speaking, slurred speech, or trouble understanding what people say. ? T - Time. Time to call emergency services. Write down what time symptoms started.  You have other signs of a stroke, such as: ? A sudden, severe headache with no known cause. ? Nausea or vomiting. ? Seizure. These symptoms may represent a serious problem that is an emergency. Do not wait to see if the symptoms will go away. Get medical help right away. Call your local emergency services (911 in the [place] Do not drive yourself to the hospital. Summary  Endocarditis is an infection of the heart valves or inner layer of the heart (endocardium). It is caused by bacteria or a fungus.  Having certain heart conditions or procedures may increase the risk of endocarditis.  Antibiotics are an important treatment for endocarditis. Take these medicines as told by your health care provider. Do not stop taking them even if you start to feel better.  Tell all of your health care providers, including your dentist, that you have had endocarditis. This information is not intended to replace advice given to you by your  health care provider. Make sure you discuss any questions you have with your health care provider. Document Revised: 11/27/2017 Document Reviewed: 11/27/2017 Elsevier Patient Education  2021 Elsevier Inc.  Steven Cortez,   You were admitted to the hospital due to a MRSA infection of your blood that unfortunately caused a spinal infection and a vegetation on your heart valve that spread to your lungs. Your infection greatly improved with IV antibiotics and your hard work with physical therapy.   You were found to have a significant kidney injury, which was thought to be due to your infection, blood in your urine, which may be related to your kidney disease vs. Underlying bladder vascularity, as well as high blood pressure, which we have been controlling with medications.   You were weaned off of opioids during your hospital stay and were started on Suboxone therapy, which can not only help with your pain, but also with your cravings for future opiate use.   Upon discharge from the hospital, it is very important that you start taking  all of the following medications as prescribed:  - Amlodipine 1 tablet daily  - Carvedilol 1 tablet daily  - Losartan 1 tablet daily  - Hydralazine 1 tablet 3 times daily (breakfast, afternoon, before bed)  - Doxycycline 1 tablet 2 times daily (breakfast, before bed)  - Buprenorphine HCL-Naloxone (AKA Suboxone) 2 times daily (breakfast, before bed)   For your pain/spasms, you may continue the following medications as needed:  - Gabapentin 1 tablet 3 times daily (breakfast, afternoon, before bed)  - Robaxin 1 tablet 4 times daily  - Voltaren Gel 4 times daily  - Tylenol, 650mg  every 6 hours   Suboxone, when taken regularly, can decrease your cravings for future drug use. You should take one tablet, twice daily every day. If you run out of your suboxone before your Opiate Use Disorder appointment April 5th, please call [facility] at [phone] and we can fill an  additional prescription. Please refrain from alcohol while taking the above pain/spasm medications as mixing the two can cause significant drowsiness/fatigue.   It is very important that you call to schedule / attend your appointments as outlined below:  - Infectious Disease w/ Dr. [phone] at [facility] 07/21/20 @ 10:30am; Phone #[phone] - Opiate Use Disorder (Suboxone) Clinic 07/28/20 @ 9:15am  - Primary Care Follow Up w/ Dr. 09/27/20 at the Internal Medicine Center [facility]) 07/28/20 @ 9:45am  - Please CALL [facility] at [phone] to schedule an appointment within the next 2 weeks if possible  ** Note that both Opiate Use Disorder is located within the [facility] clinic on the ground floor of [facility].  If you experience worsening back pain, new leg pain or numbness, worsening blood in your urine, worsening swelling of your legs, fevers, chills, difficulties breathing, severe weakness/fatigue, or any other concerning symptoms, please call your PCP @ [phone] or go to the ED.   We hope that you continue to do well outside the hospital and it was a pleasure caring for you!  All the best,  Dr. [phone]

## 2020-07-21 ENCOUNTER — Inpatient Hospital Stay: Payer: Self-pay | Admitting: Internal Medicine

## 2020-07-21 ENCOUNTER — Telehealth: Payer: Self-pay

## 2020-07-21 NOTE — Telephone Encounter (Signed)
Attempted to call patient regarding missed appointment with Dr. Renold Don. Number listed does not belong to patient. Call was disconnected. Not able to reschedule appointment at this time.

## 2020-07-28 ENCOUNTER — Encounter: Payer: Self-pay | Admitting: Internal Medicine

## 2020-07-28 ENCOUNTER — Other Ambulatory Visit: Payer: Self-pay

## 2020-07-28 ENCOUNTER — Ambulatory Visit (INDEPENDENT_AMBULATORY_CARE_PROVIDER_SITE_OTHER): Payer: Self-pay | Admitting: Internal Medicine

## 2020-07-28 VITALS — BP 145/86 | HR 85 | Temp 97.7°F | Ht 70.0 in | Wt 177.5 lb

## 2020-07-28 DIAGNOSIS — R600 Localized edema: Secondary | ICD-10-CM

## 2020-07-28 DIAGNOSIS — Z748 Other problems related to care provider dependency: Secondary | ICD-10-CM

## 2020-07-28 DIAGNOSIS — A4902 Methicillin resistant Staphylococcus aureus infection, unspecified site: Secondary | ICD-10-CM

## 2020-07-28 DIAGNOSIS — I330 Acute and subacute infective endocarditis: Secondary | ICD-10-CM

## 2020-07-28 DIAGNOSIS — Q211 Atrial septal defect: Secondary | ICD-10-CM

## 2020-07-28 DIAGNOSIS — I10 Essential (primary) hypertension: Secondary | ICD-10-CM

## 2020-07-28 DIAGNOSIS — N179 Acute kidney failure, unspecified: Secondary | ICD-10-CM

## 2020-07-28 DIAGNOSIS — N1832 Chronic kidney disease, stage 3b: Secondary | ICD-10-CM

## 2020-07-28 DIAGNOSIS — F1190 Opioid use, unspecified, uncomplicated: Secondary | ICD-10-CM

## 2020-07-28 DIAGNOSIS — Q2112 Patent foramen ovale: Secondary | ICD-10-CM

## 2020-07-28 DIAGNOSIS — G062 Extradural and subdural abscess, unspecified: Secondary | ICD-10-CM

## 2020-07-28 NOTE — Patient Instructions (Signed)
Thank you, Mr.Brigg Medel for allowing [place] to provide your care today. Today we discussed endocarditis, blood pressure.    I have ordered the following labs for you:   Lab Orders     ToxAssure Select,+Antidepr,UR     Sed Rate (ESR)     CRP (C-Reactive Protein)     CMP14 + Anion Gap     Magnesium     CBC with Diff   Tests ordered today:  none  Referrals ordered today:   Referral Orders  No referral(s) requested today    Please follow up with: [facility] ([facility]), Infectious disease, and Neurosurgery. I will call the practices to help facilitate these appointments.   Medication Changes:   There are no discontinued medications.   No orders of the defined types were placed in this encounter.    Follow up: 1 month (the end of this month)   Should you have any questions or concerns please call the internal medicine clinic at [phone].     Marianna Payment, D.O. [place]

## 2020-07-28 NOTE — Progress Notes (Signed)
CC: Endocarditis  HPI:  [name] is a 45 y.o. male with a past medical history stated below and presents today for endocarditis and Opioid use disorder. Please see problem based assessment and plan for additional details.  Past Medical History:  Diagnosis Date  . Acute renal failure (HCC) 05/22/2020  . Endocarditis     Current Outpatient Medications on File Prior to Visit  Medication Sig Dispense Refill  . amLODipine (NORVASC) 10 MG tablet Take 1 tablet (10 mg total) by mouth daily. 30 tablet 0  . amLODipine (NORVASC) 10 MG tablet TAKE 1 TABLET (10 MG TOTAL) BY MOUTH DAILY. 30 tablet 0  . buprenorphine-naloxone (SUBOXONE) 8-2 mg SUBL SL tablet Place 1 tablet under the tongue in the morning and at bedtime. 25 tablet 0  . calcium carbonate (TUMS - DOSED IN MG ELEMENTAL CALCIUM) 500 MG chewable tablet Chew 1 tablet (200 mg of elemental calcium total) by mouth 3 (three) times daily with meals. 90 tablet 0  . calcium carbonate (TUMS - DOSED IN MG ELEMENTAL CALCIUM) 500 MG chewable tablet CHEW 1 TABLET (200 MG OF ELEMENTAL CALCIUM TOTAL) BY MOUTH THREE TIMES DAILY WITH MEALS. 90 tablet 0  . carvedilol (COREG) 25 MG tablet Take 1 tablet (25 mg total) by mouth 2 (two) times daily with a meal. 60 tablet 0  . carvedilol (COREG) 25 MG tablet TAKE 1 TABLET (25 MG TOTAL) BY MOUTH TWO TIMES DAILY WITH A MEAL. 60 tablet 0  . diclofenac Sodium (VOLTAREN) 1 % GEL Apply 4 g topically 4 (four) times daily. 350 g 0  . diclofenac Sodium (VOLTAREN) 1 % GEL APPLY 4 G TOPICALLY FOUR TIMES DAILY. 300 g 0  . doxycycline (ADOXA) 100 MG tablet Take 1 tablet (100 mg total) by mouth 2 (two) times daily for 24 days. 48 tablet 0  . doxycycline (VIBRA-TABS) 100 MG tablet TAKE 1 TABLET (100 MG TOTAL) BY MOUTH TWO TIMES DAILY FOR 24 DAYS. 48 tablet 0  . gabapentin (NEURONTIN) 100 MG capsule Take 1 capsule (100 mg total) by mouth 3 (three) times daily. 90 capsule 0  . gabapentin (NEURONTIN) 100 MG capsule TAKE 1  CAPSULE (100 MG TOTAL) BY MOUTH THREE TIMES DAILY. 90 capsule 0  . hydrALAZINE (APRESOLINE) 50 MG tablet Take 1 tablet (50 mg total) by mouth every 8 (eight) hours. 90 tablet 0  . hydrALAZINE (APRESOLINE) 50 MG tablet TAKE 1 TABLET (50 MG TOTAL) BY MOUTH EVERY EIGHT HOURS. 90 tablet 0  . losartan (COZAAR) 25 MG tablet Take 1 tablet (25 mg total) by mouth daily. 30 tablet 0  . losartan (COZAAR) 25 MG tablet TAKE 1 TABLET (25 MG TOTAL) BY MOUTH DAILY. 30 tablet 0  . methocarbamol (ROBAXIN) 750 MG tablet Take 1 tablet (750 mg total) by mouth 4 (four) times daily. 120 tablet 0  . methocarbamol (ROBAXIN) 750 MG tablet TAKE 1 TABLET (750 MG TOTAL) BY MOUTH FOUR TIMES DAILY. 120 tablet 0   No current facility-administered medications on file prior to visit.    No family history on file.  Social History   Socioeconomic History  . Marital status: Single    Spouse name: Not on file  . Number of children: Not on file  . Years of education: Not on file  . Highest education level: Not on file  Occupational History  . Not on file  Tobacco Use  . Smoking status: Current Every Day Smoker    Packs/day: 0.50    Years: 14.00  Pack years: 7.00    Types: Cigarettes  . Smokeless tobacco: Never Used  Vaping Use  . Vaping Use: Never used  Substance and Sexual Activity  . Alcohol use: Not Currently  . Drug use: Yes  . Sexual activity: Not on file  Other Topics Concern  . Not on file  Social History Narrative  . Not on file   Social Determinants of Health   Financial Resource Strain: Not on file  Food Insecurity: Not on file  Transportation Needs: Not on file  Physical Activity: Not on file  Stress: Not on file  Social Connections: Not on file  Intimate Partner Violence: Not on file    Review of Systems: ROS negative except for what is noted on the assessment and plan.  Vitals:   07/28/20 0904  BP: (!) 145/86  Pulse: 85  Temp: 97.7 F (36.5 C)  TempSrc: Oral  SpO2: 100%   Weight: 177 lb 8 oz (80.5 kg)  Height: 5\' 10"  (1.778 m)     Physical Exam: Physical Exam Constitutional:      Appearance: Normal appearance.  HENT:     Head: Normocephalic and atraumatic.  Eyes:     Extraocular Movements: Extraocular movements intact.  Cardiovascular:     Rate and Rhythm: Normal rate.     Pulses: Normal pulses.     Heart sounds: Normal heart sounds.  Pulmonary:     Effort: Pulmonary effort is normal.     Breath sounds: Normal breath sounds.  Abdominal:     General: Bowel sounds are normal.     Palpations: Abdomen is soft.     Tenderness: There is no abdominal tenderness.  Musculoskeletal:        General: Swelling (bilateral pitting edema to his knees, 2-3+) and tenderness (TTP of lumbar spine with some induration wihtout erythema or warmth. ) present.     Cervical back: Normal range of motion.     Comments: Patient able to ambulate but slowly due to pain from his lumbar spine that radiates down his right leg.  Technically full range of motion but limited by pain.  Skin:    General: Skin is warm and dry.  Neurological:     General: No focal deficit present.     Mental Status: He is alert and oriented to person, place, and time. Mental status is at baseline.     Sensory: No sensory deficit.     Motor: Weakness (generlaizined) present.     Coordination: Coordination normal.     Gait: Gait normal.  Psychiatric:        Mood and Affect: Mood normal.      Assessment & Plan:   See Encounters Tab for problem based charting.  Patient discussed with Dr. , D.O. [facility] Internal Medicine, PGY-2 Pager: [phone], Phone: [phone] Date 07/29/2020 Time 11:10 AM

## 2020-07-29 ENCOUNTER — Encounter: Payer: Self-pay | Admitting: Internal Medicine

## 2020-07-29 DIAGNOSIS — R600 Localized edema: Secondary | ICD-10-CM | POA: Insufficient documentation

## 2020-07-29 DIAGNOSIS — Q2112 Patent foramen ovale: Secondary | ICD-10-CM | POA: Insufficient documentation

## 2020-07-29 DIAGNOSIS — Z748 Other problems related to care provider dependency: Secondary | ICD-10-CM | POA: Insufficient documentation

## 2020-07-29 DIAGNOSIS — I10 Essential (primary) hypertension: Secondary | ICD-10-CM | POA: Insufficient documentation

## 2020-07-29 DIAGNOSIS — Q211 Atrial septal defect: Secondary | ICD-10-CM | POA: Insufficient documentation

## 2020-07-29 LAB — CBC WITH DIFFERENTIAL/PLATELET
Basophils Absolute: 0 10*3/uL (ref 0.0–0.2)
Basos: 1 %
EOS (ABSOLUTE): 0.2 10*3/uL (ref 0.0–0.4)
Eos: 3 %
Hematocrit: 28.9 % — ABNORMAL LOW (ref 37.5–51.0)
Hemoglobin: 9.1 g/dL — ABNORMAL LOW (ref 13.0–17.7)
Immature Grans (Abs): 0 10*3/uL (ref 0.0–0.1)
Immature Granulocytes: 0 %
Lymphocytes Absolute: 2 10*3/uL (ref 0.7–3.1)
Lymphs: 31 %
MCH: 28.1 pg (ref 26.6–33.0)
MCHC: 31.5 g/dL (ref 31.5–35.7)
MCV: 89 fL (ref 79–97)
Monocytes Absolute: 0.7 10*3/uL (ref 0.1–0.9)
Monocytes: 11 %
Neutrophils Absolute: 3.5 10*3/uL (ref 1.4–7.0)
Neutrophils: 54 %
Platelets: 244 10*3/uL (ref 150–450)
RBC: 3.24 x10E6/uL — ABNORMAL LOW (ref 4.14–5.80)
RDW: 16.8 % — ABNORMAL HIGH (ref 11.6–15.4)
WBC: 6.3 10*3/uL (ref 3.4–10.8)

## 2020-07-29 LAB — CMP14 + ANION GAP
ALT: 15 IU/L (ref 0–44)
AST: 15 IU/L (ref 0–40)
Albumin/Globulin Ratio: 0.8 — ABNORMAL LOW (ref 1.2–2.2)
Albumin: 3.5 g/dL — ABNORMAL LOW (ref 4.0–5.0)
Alkaline Phosphatase: 155 IU/L — ABNORMAL HIGH (ref 44–121)
Anion Gap: 16 mmol/L (ref 10.0–18.0)
BUN/Creatinine Ratio: 17 (ref 9–20)
BUN: 37 mg/dL — ABNORMAL HIGH (ref 6–24)
Bilirubin Total: 0.3 mg/dL (ref 0.0–1.2)
CO2: 18 mmol/L — ABNORMAL LOW (ref 20–29)
Calcium: 9.6 mg/dL (ref 8.7–10.2)
Chloride: 108 mmol/L — ABNORMAL HIGH (ref 96–106)
Creatinine, Ser: 2.15 mg/dL — ABNORMAL HIGH (ref 0.76–1.27)
Globulin, Total: 4.3 g/dL (ref 1.5–4.5)
Glucose: 86 mg/dL (ref 65–99)
Potassium: 5.2 mmol/L (ref 3.5–5.2)
Sodium: 142 mmol/L (ref 134–144)
Total Protein: 7.8 g/dL (ref 6.0–8.5)
eGFR: 38 mL/min/{1.73_m2} — ABNORMAL LOW (ref >59)

## 2020-07-29 LAB — C-REACTIVE PROTEIN: CRP: 29 mg/L — ABNORMAL HIGH (ref 0–10)

## 2020-07-29 LAB — SEDIMENTATION RATE: Sed Rate: 68 mm/hr — ABNORMAL HIGH (ref 0–15)

## 2020-07-29 LAB — MAGNESIUM: Magnesium: 2 mg/dL (ref 1.6–2.3)

## 2020-07-29 NOTE — Assessment & Plan Note (Signed)
Patient following up for hypertension.  He was prescribed carvedilol 25 mg twice daily, hydralazine 50 mg 3 times daily, amlodipine 10 mg daily, losartan 25 mg daily.  His blood pressure today is 145/86.  Goal will be to decrease hydralazine and titrate losartan up depending on kidney function.  He denies any side effects from these medications and endorses adherence.  Plan: -Continue current antihypertensive medications doing kidney function electrolytes -Repeat CMP, Mg

## 2020-07-29 NOTE — Assessment & Plan Note (Signed)
Patient presents for hospital follow up. Continues to have back pain with point tenderness over his L4-L5 regions. There was some associated swelling without redness or rash. He denied any new weakness, numbness of tingling.   I counseled him of symptoms of recurrent epidural abscess and instructed him to let us know if he experiences any new symptoms.   I encouraged him to follow up with his neurosurgeon in the near future.   He remains on the Robaxin 750 mg 4 times daily and gabapentin 100 mg 3 times daily for his back pain and spasms. He is no longer taking the suboxone and states that he self discontinued this medication.   Plan: -Follow-up with neurosurgery -Low threshold to repeat MRI of his -Continue Robaxin and gabapentin as outlined above

## 2020-07-29 NOTE — Assessment & Plan Note (Signed)
Patient presents for reevaluation of his kidney function since being discharged from hospitalization.  He continues to have significant lower extremity swelling.  He states that his good urine output without any significant signs of uremia.  He will have outpatient follow-up with [facility] on the 13th.  I encouraged him to keep this appointment.  Plan: -Repeat CMP and mag today -Encouraged him to keep nephrology appointment on 4/13

## 2020-07-29 NOTE — Assessment & Plan Note (Addendum)
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  Patient's opioid requirements were titrated down during the hospitalization and he was started on Suboxone 8-2 mg daily.  Patient states that he self discontinued this medication in the outpatient setting prior to follow-up.  He denies any relapses, opioid cravings, or withdrawal symptoms.  He is currently living with his brother and trying to avoid previous environment that contributed to his IV drug use.   He states that he stopped taking his Suboxone and states he does not want to take this anymore.  On evaluation, patient does not show any obvious signs of opiate withdrawal. I am worried the patient may have relapsed. Considering recurrent IVDU could negatively affect his endocarditis treatment, I will perform a urine drug screen today.  I encouraged the patient to follow-up with [place] if you would like to restart this medication in the near future.  Plan: -Discontinue Suboxone per patient request -U tox today

## 2020-07-29 NOTE — Assessment & Plan Note (Addendum)
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  Patient states that he has been consistent with his antibiotic medication, doxycycline.  There is a note in his chart that he missed his follow-up with infectious disease.  I counseled the patient on the importance of following up with ID to make sure that your infection is completely resolved.  He denies any systemic signs or symptoms of infection including fever, chills, myalgias or arthralgias.  However, he does admit to residual back pain that is radicular nature down his right leg.  He states that this is not improved since discharge.  He continues to have some swelling/induration around his surgical site but there is no obvious erythema or rash.  He denies any new neurologic symptoms such as numbness, weakness, paresthesias.  He denies any saddle anesthesia or bowel/bladder incontinence.  I educated him on signs and symptoms of recurrent epidural abscess to look out for and to call us if he experiences any new symptoms.  Have a low threshold to get a repeat MRI of his back if new symptoms should arise.  Particularly, as the patient is no longer taking his Suboxone.  He denies IV drug use since being discharged but states that he does not feel as though he needs the Suboxone anymore.  He will need U tox today to make sure he is not at high risk for subsequent bacterial infection.    Plan: - Encouraged patient to make a follow up appointment with infectious disease -Encourage patient to continue his doxycycline as prescribed -We will get a repeat CBC with differential, CRP, ESR -We will get tox assure today. - Will need follow up Echo in the near future to make sure TV vegetation has resolved.

## 2020-07-29 NOTE — Assessment & Plan Note (Signed)
Patient has bilateral lower extremity edema.  Edema is pitting 2-3+ up to his knees.  Patient recently discharged from hospital for bacterial endocarditis.  Last echo does not show any systolic/diastolic dysfunction.  Patient does have significant CKD which could likely be treated attributing to his lower extremity edema.  He also has protein caloric malnutrition that could be compounding this.  Patient missed a good urine output without any signs or symptoms of uremia.  He does have lower extremity stockings/TED hose.  I encouraged him to use these during the day when he is ambulating.  I will repeat kidney function labs to rule out any worsening kidney function as a cause of his increased swelling.  Plan: -Continue TED hose -CMP today

## 2020-07-29 NOTE — Assessment & Plan Note (Signed)
Patient does not have a car and cannot easily access public transportation.  Therefore he needs assistance with transportation to and from his appointments.

## 2020-07-31 ENCOUNTER — Telehealth: Payer: Self-pay | Admitting: *Deleted

## 2020-07-31 NOTE — Telephone Encounter (Signed)
   Telephone encounter was:  Unsuccessful.  08-03-20 Name: Steven Cortez MRN: [id number] DOB: Feb 17, 1976  Unsuccessful outbound call made today to assist with:  Transportation Needs   Outreach Attempt:  1st Attempt  The phone rang to a nonexistent mailbox    Electronics engineer -[facility] Guide , Embedded Care Coordination [facility], Care Management  [phone] [address] [place] , [place] [address] Email : [email or web address]

## 2020-08-03 NOTE — Progress Notes (Signed)
Internal Medicine Clinic Attending  Case discussed with Dr.Coe  At the time of the visit.  We reviewed the resident's history and exam and pertinent patient test results.  I agree with the assessment, diagnosis, and plan of care documented in the resident's note. We are pleased that Steven Cortez was able to attend today's appt given his transportation limitations.  UDS may not have been completed as intended.  I share Dr. Perfecto Kingdom concerns about possible IVDU relapse.

## 2020-08-04 LAB — TOXASSURE SELECT,+ANTIDEPR,UR

## 2020-08-10 ENCOUNTER — Telehealth: Payer: Self-pay | Admitting: *Deleted

## 2020-08-10 NOTE — Telephone Encounter (Signed)
   Telephone encounter was:  Unsuccessful.  08/10/2020 Name: Steven Cortez MRN: [id number] DOB: 1975/08/26  Unsuccessful outbound call made today to assist with:  Transportation Needs   Outreach Attempt:  2nd Attempt  A HIPAA compliant voice message was left requesting a return call.  Alois Cliche -[facility] Guide , Embedded Care Coordination [facility], Care Management  [phone] [address] [place] , [place] [address] Email : [email or web address]

## 2020-08-18 ENCOUNTER — Telehealth: Payer: Self-pay | Admitting: *Deleted

## 2020-08-18 NOTE — Telephone Encounter (Signed)
   Telephone encounter was:  Unsuccessful.  08/18/2020 Name: Steven Cortez MRN: [id number] DOB: 16-Dec-1975  Unsuccessful outbound call made today to assist with:  Transportation Needs   Outreach Attempt:  3rd Attempt.  Referral closed unable to contact patient.  No way to message busy signal or no voicemail 3x.  Alois Cliche -[facility] Guide , Embedded Care Coordination [facility], Care Management  [phone] [address] [place] , [place] [address] Email : [email or web address]

## 2020-08-21 ENCOUNTER — Encounter: Payer: Self-pay | Admitting: Internal Medicine

## 2020-08-23 DEATH — deceased
